# Patient Record
Sex: Female | Born: 1945
Health system: Southern US, Community
[De-identification: ages and names within clinical notes are randomized; demographics above are authoritative.]

## PROBLEM LIST (undated history)

## (undated) DIAGNOSIS — E119 Type 2 diabetes mellitus without complications: Secondary | ICD-10-CM

## (undated) DIAGNOSIS — M1990 Unspecified osteoarthritis, unspecified site: Secondary | ICD-10-CM

## (undated) DIAGNOSIS — K7581 Nonalcoholic steatohepatitis (NASH): Secondary | ICD-10-CM

## (undated) DIAGNOSIS — I10 Essential (primary) hypertension: Secondary | ICD-10-CM

## (undated) DIAGNOSIS — F32A Depression, unspecified: Secondary | ICD-10-CM

## (undated) DIAGNOSIS — I2510 Atherosclerotic heart disease of native coronary artery without angina pectoris: Secondary | ICD-10-CM

## (undated) DIAGNOSIS — G4730 Sleep apnea, unspecified: Secondary | ICD-10-CM

## (undated) DIAGNOSIS — N189 Chronic kidney disease, unspecified: Secondary | ICD-10-CM

## (undated) DIAGNOSIS — K219 Gastro-esophageal reflux disease without esophagitis: Secondary | ICD-10-CM

## (undated) DIAGNOSIS — I493 Ventricular premature depolarization: Secondary | ICD-10-CM

## (undated) DIAGNOSIS — M4800 Spinal stenosis, site unspecified: Secondary | ICD-10-CM

## (undated) DIAGNOSIS — F419 Anxiety disorder, unspecified: Secondary | ICD-10-CM

## (undated) DIAGNOSIS — E7800 Pure hypercholesterolemia, unspecified: Secondary | ICD-10-CM

## (undated) DIAGNOSIS — J189 Pneumonia, unspecified organism: Secondary | ICD-10-CM

## (undated) DIAGNOSIS — H409 Unspecified glaucoma: Secondary | ICD-10-CM

## (undated) DIAGNOSIS — J302 Other seasonal allergic rhinitis: Secondary | ICD-10-CM

## (undated) HISTORY — DX: Other seasonal allergic rhinitis: J30.2

## (undated) HISTORY — PX: SHOULDER ARTHROSCOPY: SHX128

## (undated) HISTORY — PX: KNEE ARTHROSCOPY: SHX127

## (undated) HISTORY — PX: BUNIONECTOMY: SHX129

## (undated) HISTORY — DX: Atherosclerotic heart disease of native coronary artery without angina pectoris: I25.10

## (undated) HISTORY — PX: ANTERIOR AND POSTERIOR VAGINAL REPAIR: SUR5

## (undated) HISTORY — DX: Unspecified osteoarthritis, unspecified site: M19.90

## (undated) HISTORY — PX: ROBOTIC ASSISTED LAPAROSCOPIC SACROCOLPOPEXY: SHX5388

## (undated) HISTORY — DX: Anxiety disorder, unspecified: F41.9

## (undated) HISTORY — DX: Gastro-esophageal reflux disease without esophagitis: K21.9

## (undated) HISTORY — PX: BLADDER SUSPENSION: SHX72

## (undated) HISTORY — DX: Type 2 diabetes mellitus without complications: E11.9

---
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
## 1974-02-04 HISTORY — PX: TOTAL ABDOMINAL HYSTERECTOMY: SHX209

## 1974-02-04 HISTORY — PX: NASAL RECONSTRUCTION: SHX2069

## 1997-07-13 ENCOUNTER — Ambulatory Visit (HOSPITAL_COMMUNITY): Admission: RE | Admit: 1997-07-13 | Discharge: 1997-07-13 | Payer: Self-pay | Admitting: Gastroenterology

## 1997-10-12 ENCOUNTER — Ambulatory Visit (HOSPITAL_COMMUNITY): Admission: RE | Admit: 1997-10-12 | Discharge: 1997-10-12 | Payer: Self-pay | Admitting: Gastroenterology

## 1999-06-12 ENCOUNTER — Other Ambulatory Visit: Admission: RE | Admit: 1999-06-12 | Discharge: 1999-06-12 | Payer: Self-pay | Admitting: Obstetrics and Gynecology

## 1999-06-27 ENCOUNTER — Encounter: Admission: RE | Admit: 1999-06-27 | Discharge: 1999-07-12 | Payer: Self-pay | Admitting: *Deleted

## 2000-02-15 ENCOUNTER — Encounter (INDEPENDENT_AMBULATORY_CARE_PROVIDER_SITE_OTHER): Payer: Self-pay | Admitting: Specialist

## 2000-02-15 ENCOUNTER — Ambulatory Visit (HOSPITAL_COMMUNITY): Admission: RE | Admit: 2000-02-15 | Discharge: 2000-02-15 | Payer: Self-pay | Admitting: Gastroenterology

## 2001-02-09 ENCOUNTER — Ambulatory Visit (HOSPITAL_COMMUNITY): Admission: RE | Admit: 2001-02-09 | Discharge: 2001-02-09 | Payer: Self-pay | Admitting: Orthopedic Surgery

## 2001-06-24 ENCOUNTER — Other Ambulatory Visit: Admission: RE | Admit: 2001-06-24 | Discharge: 2001-06-24 | Payer: Self-pay | Admitting: Obstetrics and Gynecology

## 2003-03-18 ENCOUNTER — Ambulatory Visit (HOSPITAL_COMMUNITY): Admission: RE | Admit: 2003-03-18 | Discharge: 2003-03-18 | Payer: Self-pay | Admitting: Gastroenterology

## 2003-03-18 ENCOUNTER — Encounter (INDEPENDENT_AMBULATORY_CARE_PROVIDER_SITE_OTHER): Payer: Self-pay | Admitting: Specialist

## 2003-10-31 ENCOUNTER — Encounter (INDEPENDENT_AMBULATORY_CARE_PROVIDER_SITE_OTHER): Payer: Self-pay | Admitting: *Deleted

## 2003-10-31 ENCOUNTER — Ambulatory Visit (HOSPITAL_COMMUNITY): Admission: RE | Admit: 2003-10-31 | Discharge: 2003-10-31 | Payer: Self-pay | Admitting: Gastroenterology

## 2004-08-31 ENCOUNTER — Encounter: Admission: RE | Admit: 2004-08-31 | Discharge: 2004-08-31 | Payer: Self-pay | Admitting: Orthopedic Surgery

## 2004-09-18 ENCOUNTER — Ambulatory Visit (HOSPITAL_BASED_OUTPATIENT_CLINIC_OR_DEPARTMENT_OTHER): Admission: RE | Admit: 2004-09-18 | Discharge: 2004-09-18 | Payer: Self-pay | Admitting: Plastic Surgery

## 2004-09-19 ENCOUNTER — Encounter (INDEPENDENT_AMBULATORY_CARE_PROVIDER_SITE_OTHER): Payer: Self-pay | Admitting: Specialist

## 2004-10-11 ENCOUNTER — Ambulatory Visit (HOSPITAL_BASED_OUTPATIENT_CLINIC_OR_DEPARTMENT_OTHER): Admission: RE | Admit: 2004-10-11 | Discharge: 2004-10-11 | Payer: Self-pay | Admitting: Orthopedic Surgery

## 2004-10-11 ENCOUNTER — Ambulatory Visit (HOSPITAL_COMMUNITY): Admission: RE | Admit: 2004-10-11 | Discharge: 2004-10-11 | Payer: Self-pay | Admitting: Orthopedic Surgery

## 2006-05-01 ENCOUNTER — Ambulatory Visit: Payer: Self-pay | Admitting: Cardiology

## 2006-05-09 ENCOUNTER — Ambulatory Visit: Payer: Self-pay

## 2006-05-09 LAB — CONVERTED CEMR LAB
AST: 27 units/L (ref 0–37)
Albumin: 4.1 g/dL (ref 3.5–5.2)
BUN: 17 mg/dL (ref 6–23)
Basophils Absolute: 0.1 10*3/uL (ref 0.0–0.1)
Basophils Relative: 0.8 % (ref 0.0–1.0)
Chloride: 107 meq/L (ref 96–112)
Cholesterol: 131 mg/dL (ref 0–200)
Eosinophils Absolute: 0.1 10*3/uL (ref 0.0–0.6)
GFR calc Af Amer: 109 mL/min
GFR calc non Af Amer: 90 mL/min
HDL: 46.6 mg/dL (ref >39.0)
LDL Cholesterol: 52 mg/dL (ref 0–99)
Lymphocytes Relative: 27.2 % (ref 12.0–46.0)
MCHC: 34.2 g/dL (ref 30.0–36.0)
Monocytes Absolute: 0.4 10*3/uL (ref 0.2–0.7)
Neutro Abs: 4.6 10*3/uL (ref 1.4–7.7)
Neutrophils Relative %: 64.9 % (ref 43.0–77.0)
Platelets: 265 10*3/uL (ref 150–400)
RDW: 12.2 % (ref 11.5–14.6)
TSH: 1.55 microintl units/mL (ref 0.35–5.50)
VLDL: 32 mg/dL (ref 0–40)

## 2006-06-03 ENCOUNTER — Ambulatory Visit: Payer: Self-pay | Admitting: Cardiology

## 2006-12-05 ENCOUNTER — Ambulatory Visit (HOSPITAL_COMMUNITY): Admission: RE | Admit: 2006-12-05 | Discharge: 2006-12-05 | Payer: Self-pay | Admitting: Gastroenterology

## 2008-02-05 HISTORY — PX: CATARACT EXTRACTION W/ INTRAOCULAR LENS IMPLANT: SHX1309

## 2008-03-14 ENCOUNTER — Encounter: Admission: RE | Admit: 2008-03-14 | Discharge: 2008-03-14 | Payer: Self-pay | Admitting: Family Medicine

## 2008-06-14 ENCOUNTER — Ambulatory Visit (HOSPITAL_COMMUNITY): Admission: RE | Admit: 2008-06-14 | Discharge: 2008-06-14 | Payer: Self-pay | Admitting: Ophthalmology

## 2008-06-22 ENCOUNTER — Encounter: Payer: Self-pay | Admitting: Pulmonary Disease

## 2008-09-26 ENCOUNTER — Encounter: Payer: Self-pay | Admitting: *Deleted

## 2008-09-26 ENCOUNTER — Ambulatory Visit: Payer: Self-pay | Admitting: Pulmonary Disease

## 2008-09-26 DIAGNOSIS — K219 Gastro-esophageal reflux disease without esophagitis: Secondary | ICD-10-CM | POA: Insufficient documentation

## 2008-09-26 DIAGNOSIS — E785 Hyperlipidemia, unspecified: Secondary | ICD-10-CM

## 2008-09-26 DIAGNOSIS — Z9889 Other specified postprocedural states: Secondary | ICD-10-CM

## 2008-09-26 DIAGNOSIS — I10 Essential (primary) hypertension: Secondary | ICD-10-CM | POA: Insufficient documentation

## 2008-09-26 DIAGNOSIS — J45909 Unspecified asthma, uncomplicated: Secondary | ICD-10-CM | POA: Insufficient documentation

## 2009-02-04 HISTORY — PX: CARDIAC CATHETERIZATION: SHX172

## 2009-08-17 ENCOUNTER — Telehealth (INDEPENDENT_AMBULATORY_CARE_PROVIDER_SITE_OTHER): Payer: Self-pay | Admitting: *Deleted

## 2009-09-15 ENCOUNTER — Telehealth (INDEPENDENT_AMBULATORY_CARE_PROVIDER_SITE_OTHER): Payer: Self-pay | Admitting: *Deleted

## 2009-09-25 ENCOUNTER — Encounter: Payer: Self-pay | Admitting: Cardiology

## 2009-10-04 ENCOUNTER — Telehealth: Payer: Self-pay | Admitting: Cardiology

## 2009-10-13 ENCOUNTER — Telehealth: Payer: Self-pay | Admitting: Cardiology

## 2009-10-19 ENCOUNTER — Ambulatory Visit: Payer: Self-pay | Admitting: Cardiology

## 2009-10-19 DIAGNOSIS — R002 Palpitations: Secondary | ICD-10-CM

## 2009-10-24 ENCOUNTER — Telehealth: Payer: Self-pay | Admitting: Cardiology

## 2009-11-27 ENCOUNTER — Telehealth (INDEPENDENT_AMBULATORY_CARE_PROVIDER_SITE_OTHER): Payer: Self-pay | Admitting: *Deleted

## 2009-11-28 ENCOUNTER — Ambulatory Visit: Payer: Self-pay

## 2009-11-28 ENCOUNTER — Encounter: Payer: Self-pay | Admitting: Cardiology

## 2009-11-28 ENCOUNTER — Ambulatory Visit: Payer: Self-pay | Admitting: Cardiovascular Disease

## 2009-11-28 ENCOUNTER — Ambulatory Visit (HOSPITAL_COMMUNITY): Admission: RE | Admit: 2009-11-28 | Discharge: 2009-11-28 | Payer: Self-pay | Admitting: Cardiology

## 2009-11-28 ENCOUNTER — Telehealth: Payer: Self-pay | Admitting: Cardiology

## 2009-11-29 ENCOUNTER — Encounter: Payer: Self-pay | Admitting: Cardiology

## 2009-11-30 ENCOUNTER — Ambulatory Visit: Payer: Self-pay

## 2009-11-30 LAB — CONVERTED CEMR LAB
Basophils Absolute: 0 10*3/uL (ref 0.0–0.1)
Basophils Relative: 0.4 % (ref 0.0–3.0)
CO2: 28 meq/L (ref 19–32)
Chloride: 98 meq/L (ref 96–112)
Eosinophils Absolute: 0.1 10*3/uL (ref 0.0–0.7)
Glucose, Bld: 154 mg/dL — ABNORMAL HIGH (ref 70–99)
Hemoglobin: 12.7 g/dL (ref 12.0–15.0)
INR: 1 (ref 0.8–1.0)
Neutro Abs: 5.5 10*3/uL (ref 1.4–7.7)
Neutrophils Relative %: 69.2 % (ref 43.0–77.0)
Platelets: 262 10*3/uL (ref 150.0–400.0)
Prothrombin Time: 10.7 s (ref 9.7–11.8)
Sodium: 133 meq/L — ABNORMAL LOW (ref 135–145)
WBC: 7.9 10*3/uL (ref 4.5–10.5)

## 2009-12-05 ENCOUNTER — Ambulatory Visit (HOSPITAL_COMMUNITY): Admission: RE | Admit: 2009-12-05 | Discharge: 2009-12-05 | Payer: Self-pay | Admitting: Cardiology

## 2009-12-05 ENCOUNTER — Ambulatory Visit: Payer: Self-pay | Admitting: Cardiology

## 2009-12-21 ENCOUNTER — Ambulatory Visit: Payer: Self-pay | Admitting: Cardiology

## 2009-12-21 DIAGNOSIS — I2510 Atherosclerotic heart disease of native coronary artery without angina pectoris: Secondary | ICD-10-CM | POA: Insufficient documentation

## 2009-12-22 ENCOUNTER — Ambulatory Visit: Payer: Self-pay | Admitting: Cardiology

## 2010-01-01 ENCOUNTER — Telehealth: Payer: Self-pay | Admitting: Cardiology

## 2010-01-01 LAB — CONVERTED CEMR LAB
ALT: 24 units/L (ref 0–35)
AST: 19 units/L (ref 0–37)
Albumin: 4.2 g/dL (ref 3.5–5.2)
Alkaline Phosphatase: 149 units/L — ABNORMAL HIGH (ref 39–117)
Bilirubin, Direct: 0.1 mg/dL (ref 0.0–0.3)
Cholesterol: 143 mg/dL (ref 0–200)
HDL: 39.8 mg/dL (ref >39.00)
Total Protein: 6.8 g/dL (ref 6.0–8.3)
Triglycerides: 165 mg/dL — ABNORMAL HIGH (ref 0.0–149.0)

## 2010-01-15 ENCOUNTER — Emergency Department (HOSPITAL_COMMUNITY)
Admission: EM | Admit: 2010-01-15 | Discharge: 2010-01-15 | Payer: Self-pay | Source: Home / Self Care | Admitting: Emergency Medicine

## 2010-03-04 LAB — CONVERTED CEMR LAB: TSH: 2.55 microintl units/mL (ref 0.35–5.50)

## 2010-03-06 NOTE — Progress Notes (Signed)
Summary: Stress Echo pre-procedure  Phone Note Outgoing Call Call back at [facility] Phone [phone]   Call placed by: Hubbard Robinson RN,  November 27, 2009 4:01 PM Call placed to: Patient Reason for Call: Confirm/change Appt Summary of Call: Left message on answering machine with Stress Echo instructions.

## 2010-03-06 NOTE — Progress Notes (Signed)
Summary: pt returned call fro results  Phone Note Call from Patient   Caller: Patient [phone] Reason for Call: Talk to Nurse, Lab or Test Results Summary of Call: re lab results Initial call taken by: Lorenda Hatchet,  January 01, 2010 4:48 PM  Follow-up for Phone Call        Pt. aware of lab results I will fax. results per her request. Follow-up by: Carollee Sires, RN, BSN,  January 01, 2010 5:06 PM

## 2010-03-06 NOTE — Progress Notes (Signed)
  Phone Note Other Incoming   Request: Send information Summary of Call: Request for records received from [facility]. Request forwarded to [facility].

## 2010-03-06 NOTE — Procedures (Signed)
Summary: Summary Report  Summary Report   Imported By: Gemma Payor 11/03/2009 13:10:32  _____________________________________________________________________  External Attachment:    Type:   Image     Comment:   External Document

## 2010-03-06 NOTE — Letter (Signed)
Summary: Cardiac Catheterization Instructions- Main Lab  [place] [place]  [address] [place]   [place], [place] [address]   Phone: [phone]  Fax: [phone]     11/29/2009 MRN: [id number]  [facility] [place], [place]  [address]  Dear Ms. Erskine Speed,   You are scheduled for Cardiac Catheterization on Tuesday Nov.1, 2011             with Dr.Amahri Dengel.  Please arrive at the [facility] at 5:30       a.m. on the day of your procedure.  1. DIET     __x__ Nothing to eat or drink after midnight except your medications with a sip of water.  2. Come to the [place] office on Oct.27th, 2011 for lab work.  The lab at [facility] is open from 8:30 a.m. to 1:30 p.m. and 2:30 p.m. to 5:00 p.m.  The lab at [place] is open from 7:30 a.m. to 5:30 p.m.  You do not have to be fasting.  3. MAKE SURE YOU TAKE YOUR ASPIRIN.       ___x_ YOU MAY TAKE ALL of your remaining medications with a small amount of water.  4. Plan for one night stay - bring personal belongings (i.e. toothpaste, toothbrush, etc.)  5. Bring a current list of your medications and current insurance cards.  6. Must have a responsible person to drive you home.   7. Someone must be with yu for the first 24 hours after you arrive home.  8. Please wear clothes that are easy to get on and off and wear slip-on shoes.  *Special note: Every effort is made to have your procedure done on time.  Occasionally there are emergencies that present themselves at the hospital that may cause delays.  Please be patient if a delay does occur.  If you have any questions after you get home, please call the office at the number listed above.  Whitney Jannett Celestine RN

## 2010-03-06 NOTE — Progress Notes (Signed)
Summary: Holter results   Phone Note Outgoing Call Call back at [facility] Phone [phone] Call back at [phone]   Call placed by: Alvis Lemmings, RN, BSN,  October 24, 2009 3:06 PM Call placed to: Patient Summary of Call: [facility] regarding holter results. Alvis Lemmings, RN, BSN  October 24, 2009 3:06 PM   Follow-up for Phone Call        pt rtn call the numbers listed are her contact numbers Shelda Pal  October 25, 2009 11:13 AM   The pt is aware of her holter results. She will keep her appt. for her echo and stress echo and we will make further decisions, per Dr. Olevia Perches, regarding her palpitations after he reviews the results. She is agreeable. She also wanted to know if he would agree to let her take Crestor 5 mg once daily instead of Crestor 50m once daily. I will review with Dr. BOlevia Perchesand call her back. Follow-up by: HAlvis Lemmings RN, BSN,  October 25, 2009 2:20 PM  Additional Follow-up for Phone Call Additional follow up Details #1::        I discussed the pt's Crestor with Dr. BOlevia Perches ok to take 586monce daily. The pt is aware. We will recheck a lipid/liver profile at her next office visit on 11/30/09. Additional Follow-up by: HeAlvis LemmingsRN, BSN,  October 25, 2009 6:35 PM

## 2010-03-06 NOTE — Assessment & Plan Note (Signed)
Summary: eph   Visit Type:  Follow-up Referring Kannen Moxey:  Dr. Hulan Fess Primary Nuchem Grattan:  Dr. Myriam Jacobson  CC:  Post-hospital.  History of Present Illness: The patient is 65 years old and returns for a followup visit after her recent catheterization.Marland Kitchen She works part-time in the has a Marine scientist at [employer]. Her husband is also a patient of mine. she was seen for evaluation of palpitations. She had a stress echo done which showed ST depression and a wall motion abnormality suggestive of ischemia. She underwent catheterization and she had only mild nonobstructive CAD with 30-40% narrowing in the LAD.  She had previously had an event monitor or Holter monitor which showed isolated PVCs and APCs with occasional pairs.  She does have a significant risk profile for vascular disease with both parents having bypass surgery and a brother having a cardiomyopathy which is probably ischemic. She also has hypertension and hyperlipidemia.  Current Medications (verified): 1)  Losartan Potassium-Hctz 50-12.5 Mg Tabs (Losartan Potassium-Hctz) .Marland Kitchen.. 1 Tab Qd 2)  Aspirin 81 Mg  Tabs (Aspirin) .... Take 1 Tablet By Mouth Once A Day 3)  Mobic 15 Mg Tabs (Meloxicam) .... Take 1 Tablet By Mouth Once A Day 4)  Prevacid 30 Mg Cpdr (Lansoprazole) .Marland Kitchen.. 1 Tab Qd 5)  Alphagan P 0.1 % Soln (Brimonidine Tartrate) .... Bid 6)  Crestor 5 Mg Tabs (Rosuvastatin Calcium) .... Take One Tablet By Mouth Daily.  Allergies: 1)  ! Septra  Past History:  Past Medical History: Reviewed history from 09/26/2008 and no changes required. HYPERLIPIDEMIA (ICD-272.4) HYPERTENSION (ICD-401.9) GERD (ICD-530.81)  Review of Systems       ROS is negative except as outlined in HPI.   Vital Signs:  Patient profile:   65 year old female Height:      66 inches Weight:      160.25 pounds BMI:     25.96 Pulse rate:   83 / minute Pulse rhythm:   regular Resp:     18 per minute BP sitting:   138 / 78  (left arm) Cuff  size:   large  Vitals Entered By: Sidney Ace (December 21, 2009 3:40 PM)  Physical Exam  Additional Exam:  Gen. Well-nourished, in no distress   Neck: No JVD, thyroid not enlarged, no carotid bruits Lungs: No tachypnea, clear without rales, rhonchi or wheezes Cardiovascular: Rhythm regular, PMI not displaced,  heart sounds  normal, no murmurs or gallops, no peripheral edema, pulses normal in all 4 extremities. Abdomen: BS normal, abdomen soft and non-tender without masses or organomegaly, no hepatosplenomegaly. MS: No deformities, no cyanosis or clubbing   Neuro:  No focal sns   Skin:  no lesions    Impression & Recommendations:  Problem # 1:  PALPITATIONS (ICD-785.1) She had documented PVCs and APCs on the monitor. We have about her with coronary angiography and she has minimal nonobstructive disease. Most of her palpitations are related to stress. We plan reassurance. Her updated medication list for this problem includes:    Aspirin 81 Mg Tabs (Aspirin) .Marland Kitchen... Take 1 tablet by mouth once a day  Orders: EKG w/ Interpretation (93000)  Her updated medication list for this problem includes:    Aspirin 81 Mg Tabs (Aspirin) .Marland Kitchen... Take 1 tablet by mouth once a day  Problem # 2:  CAD, NATIVE VESSEL (ICD-414.01) she has minimal nonobstructive CAD. She does have significant risk factors and she is on aspirin and statin which will now be considered secondary  prevention. Her updated medication list for this problem includes:    Aspirin 81 Mg Tabs (Aspirin) .Marland Kitchen... Take 1 tablet by mouth once a day  Problem # 3:  HYPERLIPIDEMIA (ICD-272.4) She is currently on Crestor 5 mg. We will get a fasting lipid profile.  She is somewhat sensitive about side effects from the Crestor and 10 she has minimal nonobstructive disease he'll probably target her LDL in the 100 and push her Crestor unless it is higher than that. Her updated medication list for this problem includes:    Crestor 5 Mg Tabs  (Rosuvastatin calcium) .Marland Kitchen... Take one tablet by mouth daily.  Patient Instructions: 1)  We will see you back on an as needed basis. 2)  Please return for FASTING labwork on a day that is good for you: lipid/liver (272.2). 3)  Your physician recommends that you continue on your current medications as directed. Please refer to the Current Medication list given to you today.

## 2010-03-06 NOTE — Progress Notes (Signed)
Summary: c/o pvc (talk w/ BB)  Phone Note Call from Patient Call back at [facility] Phone [phone] Call back at [phone] (cell)   Caller: Patient Reason for Call: Talk to Nurse Summary of Call: per pt calling c/o pvs most everyday.  Initial call taken by: Neil Crouch,  October 04, 2009 11:37 AM  Follow-up for Phone Call        I spoke with the pt. She has not been seen since 04/2006 with Dr. Olevia Perches. She called today stating that she has had palpitations, which she believes are PVC's. She feels dropped beats and then some nausea. She has had problems with the palpitations for about 6 months. She usually notices that she has these on days she works and it is after lunch. She works part time in the [place] at [employer]. The pt. states she explained this to her PCP, Dr. Harrington Challenger, and he did not seem concerned. She tried stopping caffeine and has noticed no change in her symptoms. She thinks her HR's usually run in the 80's to 100's. Her most recent bp was around 136/80. She has no documented strips of PVC's. I explained I would need to discuss this with Dr. Olevia Perches. We may need to have her come for an office visit first, or she may need to wear a holter on days she is working to see if we can capture what is going on. I also explained to the pt. that we do sometimes give as needed metoprolol tartrate for palpitations, but since we do not know exactally what she is doing, we may need to go a different route first. I will review with  Dr. Olevia Perches on call her back no later than friday. 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 She usually works on [place], so there may be some difficulty getting a monitor on her due to scheduling.   Follow-up by: Alvis Lemmings, RN, BSN,  October 04, 2009 12:20 PM

## 2010-03-06 NOTE — Progress Notes (Signed)
Summary: appt  ---- Converted from flag ---- ---- 10/04/2009 11:24 PM, Fatima Sanger, MD, [facility] wrote: Sounds like she needs to come in for a visit and will also need either an event monitor or Holter.  Schedule OV and we can discuss monitor on Fri. BB ------------------------------  Phone Note Outgoing Call   Call placed by: Alvis Lemmings, RN, BSN,  October 13, 2009 5:39 PM Call placed to: Patient Summary of Call: I spoke with the pt. She will come in on 9/15 to see Dr. Olevia Perches. Initial call taken by: Alvis Lemmings, RN, BSN,  October 13, 2009 5:39 PM

## 2010-03-06 NOTE — Progress Notes (Signed)
Summary: bloodwork  Phone Note Call from Patient   Caller: Patient [phone] ok to leave message Reason for Call: Talk to Nurse Summary of Call: pt calling to see if she needs bloodwork to see how crestor is working?  Initial call taken by: Lorenda Hatchet,  November 28, 2009 8:16 AM  Follow-up for Phone Call        Pt wondering if they should get blood work after Corporate investment banker. LVM stating that she should get her lipids/liver checked 12 weeks after starting Crestor and to call back & we could schedule this or she could schedule at her next appt with Marquette Piontek in a few weeks. Whitney Jannett Celestine RN  November 28, 2009 8:33 AM  Follow-up by: Whitney Jannett Celestine RN,  November 28, 2009 8:30 AM

## 2010-03-06 NOTE — Assessment & Plan Note (Signed)
Summary: np6   Visit Type:  Initial Consult Referring Provider:  Dr. Hulan Fess Primary Provider:  Dr. Myriam Jacobson  CC:  irregular heart beat.  History of Present Illness: The patient is 65 years old and is seen today for evaluation of palpitations. She works part-time in the has a Marine scientist at [employer]. Her husband is also a patient of mine. I had seen her in 2008 at which time she was having some shortness of breath and high blood pressure.  He evaluated her with a Myoview scan which was negative.  The past several months she has had increasing symptoms of irregular heartbeat. These appear messy related to stress. She gets them that were quite a bit. She feels them primarily in her throat. She has no associated chest pain short of breath or presyncope.  She does have a significant risk profile for vascular disease with both parents having bypass surgery and a brother having a cardiomyopathy which is probably ischemic. She also has hypertension and hyperlipidemia.  Current Medications (verified): 1)  Losartan Potassium-Hctz 50-12.5 Mg Tabs (Losartan Potassium-Hctz) .Marland Kitchen.. 1 Tab Qd 2)  Aspirin 81 Mg  Tabs (Aspirin) .... Take 1 Tablet By Mouth Once A Day 3)  Mobic 15 Mg Tabs (Meloxicam) .... Take 1 Tablet By Mouth Once A Day 4)  Proventil Hfa 108 (90 Base) Mcg/act Aers (Albuterol Sulfate) .... Rescue Inhaler As Needed 5)  Prevacid 30 Mg Cpdr (Lansoprazole) .Marland Kitchen.. 1 Tab Qd 6)  Alphagan P 0.1 % Soln (Brimonidine Tartrate) .... Bid  Allergies (verified): 1)  ! Septra  Past History:  Past Medical History: Reviewed history from 09/26/2008 and no changes required. HYPERLIPIDEMIA (ICD-272.4) HYPERTENSION (ICD-401.9) GERD (ICD-530.81)  Family History: Reviewed history from 09/26/2008 and no changes required. CABG-mother age 54 Father had CABG in his 16s Family History Coronary Heart Disease Family History MI/Heart Attack-Brother  Social History: Reviewed history from  09/26/2008 and no changes required. 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   Vital Signs:  Patient profile:   65 year old female Height:      66 inches Weight:      159 pounds BMI:     25.76 Pulse rate:   81 / minute BP sitting:   136 / 74  (left arm) Cuff size:   regular  Vitals Entered By: Lubertha Basque, CNA (October 19, 2009 10:41 AM)  Physical Exam  Additional Exam:  Gen. Well-nourished, in no distress   Neck: No JVD, thyroid not enlarged, no carotid bruits Lungs: No tachypnea, clear without rales, rhonchi or wheezes Cardiovascular: Rhythm regular, PMI not displaced,  heart sounds  normal, no murmurs or gallops, no peripheral edema, pulses normal in all 4 extremities. Abdomen: BS normal, abdomen soft and non-tender without masses or organomegaly, no hepatosplenomegaly. MS: No deformities, no cyanosis or clubbing   Neuro:  No focal sns   Skin:  no lesions    Impression & Recommendations:  Problem # 1:  PALPITATIONS (ICD-785.1)  The etiology of the palpitations is not clear. We will plan to evaluate her with a Holter monitor for 48 hours. She has a fairly high profile for vascular disease and we will evaluate her with a rest stress echo as well as an echo. We will also do TSH.  Her updated medication list for this problem includes:    Aspirin 81  Mg Tabs (Aspirin) .Marland Kitchen... Take 1 tablet by mouth once a day  Her updated medication list for this problem includes:    Aspirin 81 Mg Tabs (Aspirin) .Marland Kitchen... Take 1 tablet by mouth once a day  Orders: TLB-TSH (Thyroid Stimulating Hormone) (84443-TSH) Holter (Holter) Stress Echo (Stress Echo) Echocardiogram (Echo)  Problem # 2:  HYPERTENSION (ICD-401.9) This is controlled on current medications. Her updated medication list for this problem includes:    Losartan Potassium-hctz 50-12.5 Mg Tabs (Losartan  potassium-hctz) .Marland Kitchen... 1 tab qd    Aspirin 81 Mg Tabs (Aspirin) .Marland Kitchen... Take 1 tablet by mouth once a day  Problem # 3:  HYPERLIPIDEMIA (ICD-272.4) She brought her lipid profile today and her total cholesterol is 232, her triglycerides were 231, her LDL was 146, and her HDL was 32. Given these readings and her overall risk profile with hypertension and positive family history, I would recommend that she be treated with statins as primary prevention against vascular events. We will start Crestor 20 mg daily. Her updated medication list for this problem includes:    Crestor 20 Mg Tabs (Rosuvastatin calcium) .Marland Kitchen... Take one tablet by mouth daily.  Patient Instructions: 1)  Your physician recommends that you schedule a follow-up appointment in: 3 weeks. 2)  Your physician recommends that you have  lab work today: tsh (785.1) 3)  Your physician has requested that you have a stress echocardiogram. For further information please visit [email or web address].  Please follow instruction sheet as given. 4)  Your physician has requested that you have an echocardiogram.  Echocardiography is a painless test that uses sound waves to create images of your heart. It provides your doctor with information about the size and shape of your heart and how well your heart's chambers and valves are working.  This procedure takes approximately one hour. There are no restrictions for this procedure. 5)  Your physician has recommended that you wear a 48 hour holter monitor- you will have this placed today.  Holter monitors are medical devices that record the heart's electrical activity. Doctors most often use these monitors to diagnose arrhythmias. Arrhythmias are problems with the speed or rhythm of the heartbeat. The monitor is a small, portable device. You can wear one while you do your normal daily activities. This is usually used to diagnose what is causing palpitations/syncope (passing out). 6)  Start Crestor 46m once  daily. Prescriptions: CRESTOR 20 MG TABS (ROSUVASTATIN CALCIUM) Take one tablet by mouth daily.  #30 x 6   Entered by:   HAlvis Lemmings RN, BSN   Authorized by:   BFatima Sanger MD, [facility]  Signed by:   HAlvis Lemmings RN, BSN on 10/19/2009   Method used:   Electronically to        [facility] (retail)       [address]      [place]  [id number]      Ph: [phone]      Fax: [phone]  RxID:   [id number]

## 2010-03-06 NOTE — Progress Notes (Signed)
  Request Recieved from [facility] sent to [facility]  August 17, 2009 1:08 PM     Appended Document:  Recieved request from [facility] sent to [facility]

## 2010-05-15 LAB — BASIC METABOLIC PANEL
Calcium: 9.1 mg/dL (ref 8.4–10.5)
Chloride: 103 mEq/L (ref 96–112)
Creatinine, Ser: 0.75 mg/dL (ref 0.4–1.2)
GFR calc non Af Amer: >60 mL/min (ref >60)
Potassium: 3.9 mEq/L (ref 3.5–5.1)
Sodium: 138 mEq/L (ref 135–145)

## 2010-06-07 ENCOUNTER — Other Ambulatory Visit: Payer: Self-pay | Admitting: Dermatology

## 2010-06-19 NOTE — Op Note (Signed)
Tammy Pineda, Tammy Pineda               ACCOUNT NO.:  [id number]   MEDICAL RECORD NO.:  [id number]          PATIENT TYPE:  AMB   LOCATION:  ENDO                         FACILITY[facility]  [facility]   PHYSICIAN:  Jeryl Columbia, M.D.    DATE OF BIRTH:  1945/04/25   DATE OF PROCEDURE:  12/05/2006  DATE OF DISCHARGE:                               OPERATIVE REPORT   PROCEDURE:  Colonoscopy.   INDICATIONS FOR PROCEDURE:  Patient with a history of colon polyps due  for repeat screening.  Consent was signed after risks, benefits,  methods, and options were thoroughly discussed in the office multiple  times.   MEDICINES USED:  Fentanyl 125 mcg, Versed 12.5 mg.   DESCRIPTION OF PROCEDURE:  Rectal inspection was pertinent for small  external hemorrhoids.  Digital examination was negative.  The video  pediatric colonoscope was inserted and easily advanced around the colon  to the cecum.  This did require abdominal pressure, but no position  changes.  The cecum was identified by the appendiceal orifice and the  ileocecal valve.  In fact, the scope was inserted a short ways into the  terminal ileum which was normal.  Photo documentation was obtained.  The  scope was slowly withdrawn.  The prep was adequate.  There was some  liquid stool that required washing and suctioning.  Unfortunately fell  back to the splenic flexure and in order to get a great look at the  transverse and readvance around the hepatic flexure, we did have to roll  her on her back and then on her right side with some abdominal pressure.  We could readvance to the cecum that way and then we withdrew.  A good  look was had on slow withdrawal.  She had a rare right diverticuli, a  few left diverticuli, but no other abnormalities as we slowly withdrew  back to the rectum.  No polyps were seen.  Once back in the rectum,  anorectal pullthrough and retroflexion confirmed some small hemorrhoids.  The scope was straightened and readvanced a short  ways up the left side  of the colon, air was suctioned, and the scope removed.  The patient  tolerated the procedure well.  There was no obvious immediate  complications.   ENDOSCOPIC ASSESSMENT:  1. Internal and external small hemorrhoids.  2. Few left, rare right diverticuli.  3. Otherwise within normal limits to the terminal ileum.   PLAN:  Recheck colon screening in five years.  Per upper tract symptoms,  she will call me for a dilation p.r.n.  In the meantime, change her  Prilosec to Protonix and consider sublingual Levsin as an antispasmodic  or the new HyoMax and she will call me if she wants any of those and I  will see her back p.r.n.           ______________________________  Jeryl Columbia, M.D.    MEM/MEDQ  D:  12/05/2006  T:  12/05/2006  Job:  [id number]   cc:   C. Melinda Crutch, M.D.  Fax: [phone]

## 2010-06-22 NOTE — Op Note (Signed)
Tammy Pineda, Tammy Pineda               ACCOUNT NO.:  [id number]   MEDICAL RECORD NO.:  [id number]          PATIENT TYPE:  AMB   LOCATION:  ENDO                         FACILITY:  [facility]   PHYSICIAN:  Jeryl Columbia, M.D.    DATE OF BIRTH:  06-Jul-1945   DATE OF PROCEDURE:  10/31/2003  DATE OF DISCHARGE:                                 OPERATIVE REPORT   PROCEDURE:  Colonoscopy with polypectomy.   INDICATIONS:  Patient with recurrent right sided colon polyps, wanted to  repeat colonoscopy a little sooner just to make sure complete removal.  Consent was signed after risks, benefits, methods, options, thoroughly  discussed in the office on multiple occasions.   MEDICINES USED:  Demerol 80, Versed 8.   PROCEDURE NOTE:  Rectal inspection was pertinent for external hemorrhoids.  Digital exam was negative. Video pediatric adjustable colonoscope was  inserted and easily advanced to approximately the level of the splenic  flexure. At its junction, there was some looping. The patient was rolled on  her back and with abdominal pressure easily able to advance to the cecum.  Other than some left greater than rare right diverticula, no abnormalities  were seen. The cecum was identified by the appendiceal orifice and ileocecal  valve. Prep was adequate. There was some liquid stool that required washing  and suctioning, but on slow withdraw through the colon, the cecum appeared  normal. In the ascending, two questionable tiny polyps, one was hot  biopsied, questionable you could see the white hue around it from possible  previous polypectomy but unsure of the specific finding, but this area was  hot biopsied. Just above that in the ascending colon, a tiny possible polyp  was seen and was cold biopsied x1. We meant to hot biopsy it, but the entire  lesion in question was removed before we could apply the cautery, and we  elected to withdraw. On slow withdraw again, other than the left greater  than right  diverticula, no other abnormalities, polyps, etc., were seen as  we slowly withdrew back to the rectum. Anorectal pull through and  retroflexion confirmed some small hemorrhoids. Scope was straightened and  readvanced through the left side of the colon. Air was suctioned, and scope  removed. The patient tolerated the procedure well. There was no obvious  immediate complication.   ENDOSCOPIC DIAGNOSES:  1.  Internal and external hemorrhoids small hemorrhoids.  2.  Left greater than rare right diverticula.  3.  Two tiny questionable ascending polyps, one hot biopsied, one cold      biopsied.  4.  Otherwise within normal limits to the cecum.   PLAN:  Await pathology. Probable recheck colon screening in three to four  years _________ and happy to see back sooner p.r.n. Otherwise return to care  of Dr. Harrington Challenger for customary health care maintenance to include yearly rectals  and guaiacs.      MEM/MEDQ  D:  10/31/2003  T:  11/01/2003  Job:  [id number]   cc:   C. Melinda Crutch, M.D.  [address]  [place]  [place] [address]  Fax: [phone]

## 2010-06-22 NOTE — Op Note (Signed)
[facility]  Patient:    Tammy Pineda, Tammy Pineda Visit Number: [id number] MRN: [id number]          Service Type: DSU Location: DAY Attending Physician:  Tarri Glenn Page Dictated by:   Laurice Record. Aplington, M.D. Proc. Date: 02/09/01 Admit Date:  02/09/2001                             Operative Report  PREOPERATIVE DIAGNOSES: 1. Painful bunion. 2. Recurrent ingrown inner border great toenail. 3. Painful deformed second toenail, secondary to fungus left foot.  POSTOPERATIVE DIAGNOSES: 1. Painful bunion. 2. Recurrent ingrown inner border great toenail. 3. Painful deformed second toenail, secondary to fungus left foot.  OPERATION: 1. Simple bunionectomy, left foot. 2. Excision of inner border nail and matrix, great toe, left foot. 3. Excision of nail and matrix, second toe, left foot.  SURGEON:  Laurice Record. Aplington, M.D.  ASSISTANT:  Nurse.  ANESTHESIA:  Local block by anesthesiologist and MAC.  JUSTIFICATION FOR PROCEDURE:  She has an 11 degree first-second metatarsal angle on standing x-ray. She has some deformity of the inner border of the great toenail which is digging into her nailbed and has a badly deformed second toenail with fungus infection.  DESCRIPTION OF PROCEDURE:  After satisfactory condition foot block by anesthesiologist, the left foot and ankle were prepped with DuraPrep and draped as a sterile field. I made a dorsomedial incision over the bunion, extending proximal and over the proximal phalanx of the great toe. Small bleeders were coagulated. The capsule was identified and opened with a flap based distally. She had large chronic bunion which was excised mainly with osteotome but also smoothed up with rongeur until there was nice anatomic configuration. We then irrigated the wound well with sterile saline and the great toe in a neutral position correcting the valgus, I reattached the capsule with interrupted 0 Vicryl. The skin  and subcutaneous tissue were then closed with interrupted 4-0 nylon mattress sutures. Next, I made two parallel incisions along the mediolateral margins of the nail in her second toe and _______ the eponychium. The nail was then removed and the matrix with sharp dissection and curet was also removed. The flap was then replaced with interrupted 4-0 nylon. I then made a similar incision along the medial base of the great toe and elevated up the inner border with a small hemostat and excised a few millimeters of medial nail as well as excising matrix with knife and curet once again. The flap was then reapproximated with interrupted 4-0 nylon. Betadine Adaptic dry sterile dressing was applied. She tolerated the procedure well and was taken to the recovery room in satisfactory condition with no known complications. Dictated by:   Laurice Record. Aplington, M.D. Attending Physician:  Clare Gandy DD:  02/09/01 TD:  02/09/01 Job: [id number]

## 2010-06-22 NOTE — Procedures (Signed)
[facility]  Patient:    Tammy Pineda, Tammy Pineda                      MRN: [id number] Proc. Date: 02/15/00 Adm. Date:  [id number] Disc. Date: [id number] Attending:  Orvis Brill CC:         Ralene Bathe. Matthew Saras, M.D., [place].   Procedure Report  PROCEDURE:  Colonoscopy.  INDICATIONS FOR PROCEDURE:  Colonic screening in a patient with probable irritable bowel syndrome.  INDICATIONS:  Consent was signed after risks, benefits, methods, and options were thoroughly discussed in the office.  MEDICINES USED:  Demerol 70, Versed 7.  DESCRIPTION OF PROCEDURE:  Rectal inspection was pertinent for small external hemorrhoids. Digital exam was negative. Pediatric video colonoscope was inserted and despite some mild looping requiring abdominal pressure, we were able to advance around the colon to the cecum. The hepatic flexure was tortuous and we did have to roll her on her back at that junction. The cecum was identified by the appendiceal orifice and the ileocecal valve. No other abnormalities were seen on insertion. The scope was inserted a short ways into the terminal ileum which was normal. Photo documentation was obtained. The scope was slowly withdrawn. In the cecum opposite the ileocecal valve seemed to be an 8 mm sessile polyp which was carefully hot biopsied x 4 on a setting of 20:20. The scope was slowly withdrawn and along the fold opposite the ileocecal valve connecting the cecum and the ascending seemed to be edematous but not a polyp and two cold biopsies were obtained of that and put in separate containers. The scope was slowly withdrawn. No other abnormalities were seen other than some left sided diverticular and wall edema as we slowly withdrew back to the rectum. The prep was adequate. There was some liquid stool that required washing and suctioning. Once back in the rectum, the scope was retroflexed pertinent for some small internal  hemorrhoids. The scope was straightened and readvanced a short ways up the sigmoid, air was suctioned, the scope removed. The patient tolerated the procedure well, there was no obvious or immediate complications.  ENDOSCOPIC DIAGNOSES: 1. Internal/external hemorrhoids. 2. Rare left sided diverticula. 3. Cecal sessile 8 mm polyp carefully hot biopsied on a setting of 20:20. 4. Abnormal fold at the cecal ascending junction probably just edema status    post cold biopsy. 5. Otherwise within normal limits to the terminal ileum.  PLAN:  Await pathology to determine future colonic screening. GI follow-up p.r.n. Otherwise return care to Dr. Matthew Saras at [facility] for the customary health care maintenance to include yearly rectals and guaiacs and in the meantime continue her Prilosec since that works wonderfully on her upper tract symptoms. DD:  02/15/00 TD:  02/16/00 Job: [id number]

## 2010-06-22 NOTE — Op Note (Signed)
Tammy Pineda, BRASHEAR               ACCOUNT NO.:  [id number]   MEDICAL RECORD NO.:  [id number]          PATIENT TYPE:  AMB   LOCATION:  [place]                          FACILITY:  [facility]   PHYSICIAN:  Crissie Reese, M.D.     DATE OF BIRTH:  14-Jul-1945   DATE OF PROCEDURE:  09/18/2004  DATE OF DISCHARGE:                                 OPERATIVE REPORT   PREOPERATIVE DIAGNOSIS:  Lesions of undetermined behavior of forehead, left  cheek and back.   POSTOPERATIVE DIAGNOSIS:  Lesions of undetermined behavior of forehead, left  cheek and back.   OPERATION PERFORMED:  1.  Excision lesion of forehead, greater than 0.5 cm undetermined behavior.  2.  Excision lesion of cheek, less than 0.5 cm.  3.  Destruction of lesion, right shoulder.  4.  Complex wound closures, face, total length 2.0 cm.   SURGEON:  Crissie Reese, M.D.   ANESTHESIA:  1% Xylocaine with epinephrine plus bicarb.   INDICATIONS FOR PROCEDURE:  This is a 65 year old woman who has lesions she  would like to have removed.  These have enlarged and changed and have become  pigmented.  Petra Kuba of the procedure and the risks were discussed with her  and she wished to proceed.   DESCRIPTION OF PROCEDURE:  The patient was taken to the operating room and  placed supine.  The lesions were marked and she agreed with the markings.  She was prepped with Betadine and draped with sterile drapes.  Elliptical  incisions were designed with the skin line and the local anesthetic was  infiltrated satisfactorily.  The elliptical excisions were performed as such  and removed.  Wounds irrigated thoroughly and layered closures with 5-0  Monocryl interrupted inverted deep sutures and 5-0 Monocryl interrupted  inverted deep dermal sutures and 6-0 Prolene simple running suture.  Antiobiotic ointment was applied as well as dry sterile dressings.  She was  then placed in left lateral decubitus position and the lesion on the right  shoulder was addressed.   Satisfactory local anesthesia was achieved.  The  lesion was removed with curette and the base was cauterized with a needle  tip cautery on a very low setting.  Antibiotic ointment and Xeroform gauze  applied.  This is just a very partial thickness curettage of this lesion.  The patient tolerated the procedure well.   DISPOSITION:  Recheck in the office next week.      Crissie Reese, M.D.  Electronically Signed     DB/MEDQ  D:  09/18/2004  T:  09/18/2004  Job:  [id number]

## 2010-06-22 NOTE — Op Note (Signed)
NAME:  ADRIE, PICKING                         ACCOUNT NO.:  [id number]   MEDICAL RECORD NO.:  [id number]                   PATIENT TYPE:  AMB   LOCATION:  ENDO                                 FACILITY:  [facility]   PHYSICIAN:  Jeryl Columbia, M.D.                 DATE OF BIRTH:  September 25, 1945   DATE OF PROCEDURE:  03/18/2003  DATE OF DISCHARGE:                                 OPERATIVE REPORT   PROCEDURE:  Colonoscopy with polypectomy.   INDICATION:  Patient with history of colon polyps, due for repeat screening.  Consent was signed after risks, benefits, methods, and options thoroughly  discussed in the office in the past.   MEDICINES USED:  1. Fentanyl 80 mcg.  2. Versed 8 mg.   DESCRIPTION OF PROCEDURE:  Rectal inspection was pertinent for external  hemorrhoids.  Digital exam was negative.  The tone seemed adequate.  Video  pediatric adjustable colonoscope was inserted and easily to the mid  transverse.  At that point, there was looping, rolled her on her back and  with abdominal pressure, were able to be advanced to the cecum.  In the mid  ascending, a sessile polyp just under a fold was seen.  Photodocumentation  was obtained.  No other abnormalities were seen on insertion.  The cecum was  identified by the appendiceal orifice and the ileocecal valve.  Just  opposite the ileocecal valve, the residual of the polyp previously seen was  seen and with a setting of 15/150, we carefully snared the polyp making sure  not too much mucosa was in the snare.  Electrocautery was applied.  The  polyp was removed and suctioned through the scope.  We did take a few hot  biopsies of the edge of the polyp on the same setting.  We put those in a  different container.  There were no signs of bleeding or obvious residual  polyp at this junction.  We went ahead and withdrew back to the ascending  sessile polyp and on the same settings, proceeded with a snare in the same  fashion.  Part of the polyp was  removed, suctioned through the scope, and  collected in the trap and put in the third container.  A few hot biopsies  again of this residual parts of the polyp were obtained and put in the third  container as well.  There were no signs of bleeding or obvious complication.  The scope was then slowly withdrawn.  We did roll her on her left side to  see if there was any obvious residual polyps on a different angle, and none  were seen.  We went ahead and further withdrew.  There was some early left-  sided diverticula and two sigmoid tiny to small polyps which we hot biopsied  and put in the fourth container.  One was in the proximal sigmoid, and the  other was  in the more distal sigmoid.  No other abnormalities were seen as  we slowly withdrew back to the rectum.  Anorectal pull-through and  retroflexion confirmed some small hemorrhoids.  The scope was reinserted a  short ways up the left side of the colon; air was suctioned and scope  removed.  The patient tolerated the procedure well.  There was no obvious  immediate complication.   ENDOSCOPIC DIAGNOSES:  1. Internal-external hemorrhoids.  2. Left-sided small diverticula.  3. Two questionable sigmoid polyps, both hot biopsied and put in container     #4.  4. Ascending sessile polyp, snared and hot biopsied, put in #3.  5. Residual cecal polyp, status post snare, put in #1 and hot biopsy of the     _________ #2.  6. Otherwise, within normal limits to the cecum.   PLAN:  1. Await pathology but based on the sessile nature and difficult to remove     positions, would repeat sooner.  2. Happy to see back p.r.n.  3. Customary postpolypectomy instructions.  4. Yearly rectals and guaiacs per Dr. Harrington Challenger.                                               Jeryl Columbia, M.D.    MEM/MEDQ  D:  03/18/2003  T:  03/18/2003  Job:  [id number]   cc:   C. Melinda Crutch, M.D.  [address]  [place]  [place] [address]  Fax: [phone]

## 2010-06-22 NOTE — Op Note (Signed)
[name], [name]               ACCOUNT NO.:  [id number]   MEDICAL RECORD NO.:  [id number]          PATIENT TYPE:  AMB   LOCATION:  [facility]                         [facility]ACILITY:  [facility]   PHYSICIAN:  Metta Clines. Supple, M.D.  DATE OF BIRTH:  [birth year]   DATE OF PROCEDURE:  10/11/2004  DATE OF DISCHARGE:                                 OPERATIVE REPORT   PREOPERATIVE DIAGNOSES:  1.  Left shoulder impingement syndrome.  2.  Left shoulder symptomatic acromioclavicular joint arthrosis.   POSTOPERATIVE DIAGNOSES:  1.  Left shoulder impingement syndrome.  2.  Left shoulder symptomatic acromioclavicular joint arthrosis.  3.  Left shoulder adhesive capsulitis.   PROCEDURE:  1.  Left shoulder examination under anesthesia.  2.  Left shoulder manipulation under anesthesia.  3.  Left shoulder glenohumeral joint diagnostic arthroscopy.  4.  Arthroscopic subacromial decompression and bursectomy.  5.  Arthroscopic distal clavicle resection.   SURGEON:  Metta Clines. Supple, M.D.   Terrence DupontOlivia Mackie A. Shuford, P.A.-C.   ANESTHESIA:  General endotracheal as well as an interscalene block.   ESTIMATED BLOOD LOSS:  Minimal.   DRAINS:  None.   HISTORY:  Tammy Pineda is a 65 year old female whose had chronic left  shoulder pain, weakness and limitations in motion with examination showing a  positive impingement sign and exquisite tenderness over the [facility] joint.  Preoperative x-rays and MRI scan show evidence for advanced AC joint  arthropathy with a prominent inferiorly projecting spur impinging upon the  rotator cuff. There is no obvious full-thickness rotator cuff tears. Due to  Tammy Pineda's ongoing pain and functional limitation, she is brought to the  operating room at this time for planned left shoulder arthroscopy as  described below.   Preoperatively I had counseled Tammy Pineda on treatment options as well as  the risks versus benefits thereof. Possible surgical complications of  bleeding,  infection, neurovascular injury, persistent pain, loss of motion,  anesthetic complications and possible need for additional surgery are  reviewed. She understands and accepts and agrees with our planned procedure.   DESCRIPTION OF PROCEDURE:  After undergoing routine preop evaluation, the  patient received prophylactic antibiotics. An interscalene block was  established in the holding area with the anesthesia department. Placed  supine on the operating table and underwent smooth induction of general  endotracheal anesthesia. Turned to the right lateral decubitus position on  the bean bag and appropriately padded and protected. Left shoulder  examination under anesthesia showed significant restrictions in passive  shoulder motion. Approximately 120 degrees of forward elevation. I performed  a manipulation with audible and palpable release of adhesions and ultimately  170 degrees of abduction and forward elevation was achieved. The left arm  was then suspended in the 70/30 position with 10 pounds of traction. The  left shoulder girdle region was sterilely prepped and draped in standard  fashion. A posterior portal was established into the glenohumeral joint and  diagnostic arthroscopy was performed. The glenohumeral articular surfaces  were in excellent condition. There was no obvious biceps pathology. The  labrum was intact although we did see evidence  where there had been some  tearing of the capsular tissues anteriorly and posteriorly secondary to the  manipulation. Hemarthrosis was evacuated. The rotator cuff was carefully  inspected and found to be intact. The remaining inspection of the  glenohumeral joint showed no obvious additional pathology. Fluid and  instruments were removed from the glenohumeral joint with the arm dropped  down to 30 degrees of abduction and the arthroscope was introduced into the  subacromial space with the posterior portal and a direct lateral portal   established in the subacromial space. Abundant proliferative bursal tissue  was encountered and this was removed with a combination of the shaver and  the Arthrex wand. The wand was then used to remove the periosteum from the  undersurface of the anterior half of the acromion and a subacromial  decompression was performed with a bur creating type 1 morphology. A portal  was then established directly anterior to the distal clavicle and a distal  clavicle resection was performed with a bur. Care was taken to be sure that  the entire circumference of the distal clavicle could be visualized to  ensure adequate removal of bone. We then completed the subacromial  bursectomy and obtained hemostasis. The bursal surface of the rotator cuff  was inspected and probed and found to be intact. Final inspection and  irrigation was then completed. Fluid and instruments were removed. The  portals were closed Monocryl and Steri-Strips. A bulky dry dressing  __________ left shoulder, left arm was placed in a sling. The patient was  __________ extubated and taken to the recovery room in stable condition.      Metta Clines. Supple, M.D.  Electronically Signed     KMS/MEDQ  D:  10/11/2004  T:  10/11/2004  Job:  [id number]

## 2010-06-22 NOTE — Assessment & Plan Note (Signed)
[facility]                            CARDIOLOGY OFFICE NOTE   QUATISHA, ZYLKA                      MRN:          [id number]  DATE:05/01/2006                            DOB:          1945/06/13    REFERRING PHYSICIAN:  C. Melinda Crutch, M.D.   PRIMARY CARE PHYSICIAN:  C. Melinda Crutch, M.D., [place],  [facility].   HISTORY OF PRESENT ILLNESS:  Mrs. Marrin is a 65 year old who is known  to me through her husband, who is a patient of mine. She is also an OR  Nurse at [employer]. She has recently been having  symptoms of exertional fatigue and elevated blood pressure. She takes  her blood pressure at home and she notices that when she exerts herself,  she will get real full in her head and she will take her blood pressure  and it can be as high at 170 or 180. She also notices that when she  exerts herself and goes up hills, she will get short of breath and give  out very easily. She has had no chest pain. Does have occasional  palpitations.   PAST FAMILY PSYCHIATRIC HISTORY:  Significant for the above mentioned  hypertension, although she has never been on medication. She also has an  elevated cholesterol, that has been treated with Crestor. She said her  cholesterol went from 270 down to 170 and I have a reading from Dr. Harrington Challenger  of 140 now. She also has a history of GERD.   PAST SURGICAL HISTORY:  Significant for previous shoulder surgery,  hysterectomy, left knee arthroscopy.   CURRENT MEDICATIONS:  Include Crestor, Mobic, and Prilosec.   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 She does not smoke. She is married and  has 1 child.   FAMILY HISTORY:  Positive in that she has a mother who had CABG at age  72, although she probably had myocardial infarctions before that. She is  alive at 61. Father has no heart disease. She has a brother who at  72,  has an ejection fraction of 25% after previous myocardial infarctions.   REVIEW OF SYSTEMS:  Positive for symptoms related to arthritis in her  knees and reflux symptoms.   PHYSICAL EXAMINATION:  VITAL SIGNS:  Blood pressure is 155/78, pulse 79  and regular.  NECK:  There was no venous distention. Carotid pulses were full without  bruits.  CHEST:  Clear.  CARDIAC:  Rhythm was regular. I could hear no murmurs or gallops.  ABDOMEN:  Soft with normal bowel sounds. There is no hepatosplenomegaly.  EXTREMITIES:  Peripheral pulses were full. There is no peripheral edema.  MUSCULOSKELETAL:  No deformities.  SKIN:  Warm and dry.  NEUROLOGIC:  Examination showed no focal neurological signs.   DIAGNOSTIC STUDIES:  An electrocardiogram was normal.   IMPRESSION:  1. Exertional fatigue and shortness of breath, rule out ischemia.  2. Hypertension, recent onset.  3. Hyperlipidemia, treated.  4. Positive family history for coronary heart disease.  RECOMMENDATIONS:  Mrs. Mcmurry has a very high risk profile for vascular  disease and has symptoms of exertional shortness of breath and fatigue.  I think she should be evaluated further and we have arranged for her to  have a rest/stress exercise Myoview scan. Her blood pressure is also up,  both by her readings today and at home and I think that she  should__________. Will start her on Norvasc 5 mg daily for her blood  pressure. Will also get laboratory work including a BMP, CBC, and TSH. I  will plan to see her back in 3 weeks, to judge her response to treatment  and discuss the results of her Myoview scan.     Bruce Alfonso Patten Olevia Perches, MD, [facility]  Electronically Signed    BRB/MedQ  DD: 05/01/2006  DT: 05/01/2006  Job #: [id number]

## 2011-02-26 DIAGNOSIS — E7800 Pure hypercholesterolemia, unspecified: Secondary | ICD-10-CM | POA: Diagnosis not present

## 2011-02-26 DIAGNOSIS — F430 Acute stress reaction: Secondary | ICD-10-CM | POA: Diagnosis not present

## 2011-02-26 DIAGNOSIS — Z23 Encounter for immunization: Secondary | ICD-10-CM | POA: Diagnosis not present

## 2011-02-26 DIAGNOSIS — I10 Essential (primary) hypertension: Secondary | ICD-10-CM | POA: Diagnosis not present

## 2011-02-26 DIAGNOSIS — K219 Gastro-esophageal reflux disease without esophagitis: Secondary | ICD-10-CM | POA: Diagnosis not present

## 2011-02-26 DIAGNOSIS — M19049 Primary osteoarthritis, unspecified hand: Secondary | ICD-10-CM | POA: Diagnosis not present

## 2011-03-26 DIAGNOSIS — N9089 Other specified noninflammatory disorders of vulva and perineum: Secondary | ICD-10-CM | POA: Diagnosis not present

## 2011-05-07 DIAGNOSIS — H401290 Low-tension glaucoma, unspecified eye, stage unspecified: Secondary | ICD-10-CM | POA: Diagnosis not present

## 2011-05-07 DIAGNOSIS — H04129 Dry eye syndrome of unspecified lacrimal gland: Secondary | ICD-10-CM | POA: Diagnosis not present

## 2011-05-07 DIAGNOSIS — H2510 Age-related nuclear cataract, unspecified eye: Secondary | ICD-10-CM | POA: Diagnosis not present

## 2011-09-13 DIAGNOSIS — R079 Chest pain, unspecified: Secondary | ICD-10-CM | POA: Diagnosis not present

## 2011-09-13 DIAGNOSIS — R42 Dizziness and giddiness: Secondary | ICD-10-CM | POA: Diagnosis not present

## 2011-10-02 DIAGNOSIS — Z1231 Encounter for screening mammogram for malignant neoplasm of breast: Secondary | ICD-10-CM | POA: Diagnosis not present

## 2011-11-06 DIAGNOSIS — Z961 Presence of intraocular lens: Secondary | ICD-10-CM | POA: Diagnosis not present

## 2011-11-06 DIAGNOSIS — H2510 Age-related nuclear cataract, unspecified eye: Secondary | ICD-10-CM | POA: Diagnosis not present

## 2011-11-06 DIAGNOSIS — H401290 Low-tension glaucoma, unspecified eye, stage unspecified: Secondary | ICD-10-CM | POA: Diagnosis not present

## 2011-11-11 DIAGNOSIS — Z01419 Encounter for gynecological examination (general) (routine) without abnormal findings: Secondary | ICD-10-CM | POA: Diagnosis not present

## 2011-11-11 DIAGNOSIS — Z0000 Encounter for general adult medical examination without abnormal findings: Secondary | ICD-10-CM | POA: Diagnosis not present

## 2011-12-16 DIAGNOSIS — M2550 Pain in unspecified joint: Secondary | ICD-10-CM | POA: Diagnosis not present

## 2012-02-06 DIAGNOSIS — M064 Inflammatory polyarthropathy: Secondary | ICD-10-CM | POA: Diagnosis not present

## 2012-02-06 DIAGNOSIS — R5383 Other fatigue: Secondary | ICD-10-CM | POA: Diagnosis not present

## 2012-02-06 DIAGNOSIS — M2550 Pain in unspecified joint: Secondary | ICD-10-CM | POA: Diagnosis not present

## 2012-02-06 DIAGNOSIS — M1990 Unspecified osteoarthritis, unspecified site: Secondary | ICD-10-CM | POA: Diagnosis not present

## 2012-02-06 DIAGNOSIS — R5381 Other malaise: Secondary | ICD-10-CM | POA: Diagnosis not present

## 2012-02-09 ENCOUNTER — Emergency Department (HOSPITAL_COMMUNITY)
Admission: EM | Admit: 2012-02-09 | Discharge: 2012-02-09 | Disposition: A | Discharge status: Home / Self Care | Payer: Medicare Other | Attending: Emergency Medicine | Admitting: Emergency Medicine

## 2012-02-09 ENCOUNTER — Encounter (HOSPITAL_COMMUNITY): Payer: Self-pay | Admitting: Adult Health

## 2012-02-09 ENCOUNTER — Emergency Department (HOSPITAL_COMMUNITY): Payer: Medicare Other

## 2012-02-09 DIAGNOSIS — Z7982 Long term (current) use of aspirin: Secondary | ICD-10-CM | POA: Insufficient documentation

## 2012-02-09 DIAGNOSIS — F419 Anxiety disorder, unspecified: Secondary | ICD-10-CM

## 2012-02-09 DIAGNOSIS — Z8679 Personal history of other diseases of the circulatory system: Secondary | ICD-10-CM | POA: Diagnosis not present

## 2012-02-09 DIAGNOSIS — E7800 Pure hypercholesterolemia, unspecified: Secondary | ICD-10-CM | POA: Diagnosis not present

## 2012-02-09 DIAGNOSIS — I10 Essential (primary) hypertension: Secondary | ICD-10-CM | POA: Insufficient documentation

## 2012-02-09 DIAGNOSIS — Z79899 Other long term (current) drug therapy: Secondary | ICD-10-CM | POA: Diagnosis not present

## 2012-02-09 DIAGNOSIS — F411 Generalized anxiety disorder: Secondary | ICD-10-CM | POA: Insufficient documentation

## 2012-02-09 DIAGNOSIS — R0789 Other chest pain: Secondary | ICD-10-CM | POA: Diagnosis not present

## 2012-02-09 DIAGNOSIS — R0609 Other forms of dyspnea: Secondary | ICD-10-CM | POA: Insufficient documentation

## 2012-02-09 DIAGNOSIS — R0989 Other specified symptoms and signs involving the circulatory and respiratory systems: Secondary | ICD-10-CM | POA: Insufficient documentation

## 2012-02-09 DIAGNOSIS — R110 Nausea: Secondary | ICD-10-CM | POA: Insufficient documentation

## 2012-02-09 DIAGNOSIS — H409 Unspecified glaucoma: Secondary | ICD-10-CM | POA: Diagnosis not present

## 2012-02-09 DIAGNOSIS — R079 Chest pain, unspecified: Secondary | ICD-10-CM | POA: Diagnosis not present

## 2012-02-09 HISTORY — DX: Essential (primary) hypertension: I10

## 2012-02-09 HISTORY — DX: Unspecified glaucoma: H40.9

## 2012-02-09 HISTORY — DX: Pure hypercholesterolemia, unspecified: E78.00

## 2012-02-09 HISTORY — DX: Ventricular premature depolarization: I49.3

## 2012-02-09 LAB — BASIC METABOLIC PANEL
CO2: 28 mEq/L (ref 19–32)
Chloride: 105 mEq/L (ref 96–112)
GFR calc Af Amer: 75 mL/min — ABNORMAL LOW (ref >90)
Glucose, Bld: 141 mg/dL — ABNORMAL HIGH (ref 70–99)
Sodium: 142 mEq/L (ref 135–145)

## 2012-02-09 LAB — POCT I-STAT TROPONIN I: Troponin i, poc: 0 ng/mL (ref 0.00–0.08)

## 2012-02-09 LAB — CBC
Hemoglobin: 13 g/dL (ref 12.0–15.0)
MCH: 29.5 pg (ref 26.0–34.0)
Platelets: 279 10*3/uL (ref 150–400)
RBC: 4.4 MIL/uL (ref 3.87–5.11)

## 2012-02-09 LAB — D-DIMER, QUANTITATIVE: D-Dimer, Quant: 0.31 ug/mL-FEU (ref 0.00–0.48)

## 2012-02-09 MED ORDER — MORPHINE SULFATE 4 MG/ML IJ SOLN: 4.0000 mg | Freq: Once | INTRAMUSCULAR | Status: DC

## 2012-02-09 MED ORDER — LORAZEPAM 2 MG/ML IJ SOLN
0.5000 mg | Freq: Once | INTRAMUSCULAR | Status: AC
  Administered 2012-02-09: 0.5 mg via INTRAVENOUS
  Filled 2012-02-09: qty 1

## 2012-02-09 MED ORDER — MORPHINE SULFATE 2 MG/ML IJ SOLN
2.0000 mg | Freq: Once | INTRAMUSCULAR | Status: AC
  Administered 2012-02-09: 2 mg via INTRAVENOUS
  Filled 2012-02-09: qty 1

## 2012-02-09 MED ORDER — ASPIRIN 325 MG PO TABS
325.0000 mg | ORAL_TABLET | ORAL | Status: AC
  Administered 2012-02-09: 325 mg via ORAL
  Filled 2012-02-09: qty 1

## 2012-02-09 MED ORDER — LORAZEPAM 1 MG PO TABS: 1.0000 mg | ORAL_TABLET | Freq: Three times a day (TID) | ORAL | Status: DC | PRN

## 2012-02-09 NOTE — ED Notes (Signed)
Patient transported to X-ray 

## 2012-02-09 NOTE — ED Provider Notes (Signed)
History     CSN: [id number]  Arrival date & time 02/09/12  1627   First MD Initiated Contact with Patient 02/09/12 1643      Chief Complaint  Patient presents with  . Chest Pain    (Consider location/radiation/quality/duration/timing/severity/associated sxs/prior treatment) Patient is a 67 y.o. female presenting with chest pain. The history is provided by the patient.  Chest Pain   She noticed onset last night of pain as severe pressure feeling in her chest. This started while she was dancing rather vigorously. There is associated dyspnea nausea but no vomiting. There was no diaphoresis. Heaviness is rated at 8/10, but it subsided and is now 4/10. It is never gone away. It is not worse with a deep breathing it is not affected by body position. It is intermittently worse with walking. She's never had symptoms like this before. The discomfort did radiate up to her neck, but not to her jaw, shoulder, back, or arm. She took a one of her husband's nitroglycerin tablets which did tingle under her tongue and did give her a headache but did not affect her discomfort.  Past Medical History  Diagnosis Date  . Hypertension   . Glaucoma   . Hypercholesteremia   . PVC (premature ventricular contraction)     Past Surgical History  Procedure Date  . Cardiac catheterization 2011    clean cath  . Cardiovascular stress test     History reviewed. No pertinent family history.  History  Substance Use Topics  . Smoking status: Never Smoker   . Smokeless tobacco: Not on file  . Alcohol Use: No    OB History    Grav Para Term Preterm Abortions TAB SAB Ect Mult Living                  Review of Systems  Cardiovascular: Positive for chest pain.  All other systems reviewed and are negative.    Allergies  Sulfamethoxazole w-trimethoprim  Home Medications  No current outpatient prescriptions on file.  BP 147/61  Pulse 78  Temp 98 F (36.7 C) (Oral)  Resp 20  SpO2 97%  Physical  Exam  Nursing note and vitals reviewed. 67 year old female, resting comfortably and in no acute distress. Vital signs are significant for mild hypertension with blood pressure 147/61. Oxygen saturation is 97%, which is normal. Head is normocephalic and atraumatic. PERRLA, EOMI. Oropharynx is clear. Neck is nontender and supple without adenopathy or JVD. Back is nontender and there is no CVA tenderness. Lungs are clear without rales, wheezes, or rhonchi. Chest is nontender. Heart has regular rate and rhythm without murmur. Abdomen is soft, flat, nontender without masses or hepatosplenomegaly and peristalsis is normoactive. Extremities have no cyanosis or edema, full range of motion is present. Skin is warm and dry without rash. Neurologic: Mental status is normal, cranial nerves are intact, there are no motor or sensory deficits.   ED Course  Procedures (including critical care time)   Labs Reviewed  CBC  BASIC METABOLIC PANEL   No results found.   Date: 02/09/2012  Rate: 84  Rhythm: normal sinus rhythm  QRS Axis: normal  Intervals: normal  ST/T Wave abnormalities: nonspecific ST/T changes  Conduction Disutrbances:none  Narrative Interpretation: Nonspecific ST and T changes. When compared with ECG of 12/21/2009, no significant changes are seen.  Old EKG Reviewed: unchanged    1. Chest discomfort   2. Anxiety       MDM  Chest discomfort of uncertain cause.  I reviewed her prior records and she had a cardiac catheterization on 12/05/2009 which showed mild to moderate lesions in the LAD with the worst lesion at 30-40% and no lesions in the circumflex or right coronary arteries. Given her rather recent stress test without significant obstruction, it is unlikely that this is a cardiac in nature. Also, troponin is normal and ECG is unchanged. She will get screen for pulmonary embolism with a d-dimer. Aspirin was ordered at triage, and she is given a dose of morphine.  She got  little relief from morphine. Her sensation is concerned that she may have significant depression. She's given a dose of lorazepam with excellent relief of symptoms. She is encouraged to follow through with counseling to getting any issues that may be causing anxiety or stress and she's given a prescription for lorazepam and is to followup with her PCP in the next several days.        Delora Fuel, MD [id number]

## 2012-02-09 NOTE — ED Notes (Addendum)
Presents with " Chest pressure that began last night while dancing associated with exhaustion, SOB, dizziness. I rested for 30 minutes and attempted to dance again, but I was too tired so we went home. The chest pressure has been constant and has not subsided. I took .34m of xanax hoping it would help. It did not change anything, I tried to sleep and the pressure remained all through the night and all today. It has not worsened or gotten any better. Nothing makes pressure better. Movement and exertion make pressure worse. Everything makes me tired, even talking. I am just really tired." Recently began Voltaren.  Pressure is on the left side radiates up neck.

## 2012-02-13 DIAGNOSIS — R0789 Other chest pain: Secondary | ICD-10-CM | POA: Diagnosis not present

## 2012-02-13 DIAGNOSIS — F411 Generalized anxiety disorder: Secondary | ICD-10-CM | POA: Diagnosis not present

## 2012-03-02 DIAGNOSIS — M2550 Pain in unspecified joint: Secondary | ICD-10-CM | POA: Diagnosis not present

## 2012-03-02 DIAGNOSIS — M76899 Other specified enthesopathies of unspecified lower limb, excluding foot: Secondary | ICD-10-CM | POA: Diagnosis not present

## 2012-03-02 DIAGNOSIS — M064 Inflammatory polyarthropathy: Secondary | ICD-10-CM | POA: Diagnosis not present

## 2012-03-02 DIAGNOSIS — M1990 Unspecified osteoarthritis, unspecified site: Secondary | ICD-10-CM | POA: Diagnosis not present

## 2012-03-06 DIAGNOSIS — F411 Generalized anxiety disorder: Secondary | ICD-10-CM | POA: Diagnosis not present

## 2012-03-06 DIAGNOSIS — R0789 Other chest pain: Secondary | ICD-10-CM | POA: Diagnosis not present

## 2012-04-23 DIAGNOSIS — J209 Acute bronchitis, unspecified: Secondary | ICD-10-CM | POA: Diagnosis not present

## 2012-05-11 DIAGNOSIS — A088 Other specified intestinal infections: Secondary | ICD-10-CM | POA: Diagnosis not present

## 2012-05-11 DIAGNOSIS — R197 Diarrhea, unspecified: Secondary | ICD-10-CM | POA: Diagnosis not present

## 2012-05-12 DIAGNOSIS — Z961 Presence of intraocular lens: Secondary | ICD-10-CM | POA: Diagnosis not present

## 2012-05-12 DIAGNOSIS — H04129 Dry eye syndrome of unspecified lacrimal gland: Secondary | ICD-10-CM | POA: Diagnosis not present

## 2012-05-12 DIAGNOSIS — Z09 Encounter for follow-up examination after completed treatment for conditions other than malignant neoplasm: Secondary | ICD-10-CM | POA: Diagnosis not present

## 2012-05-12 DIAGNOSIS — H2510 Age-related nuclear cataract, unspecified eye: Secondary | ICD-10-CM | POA: Diagnosis not present

## 2012-06-03 DIAGNOSIS — M064 Inflammatory polyarthropathy: Secondary | ICD-10-CM | POA: Diagnosis not present

## 2012-06-03 DIAGNOSIS — M2550 Pain in unspecified joint: Secondary | ICD-10-CM | POA: Diagnosis not present

## 2012-06-03 DIAGNOSIS — Z79899 Other long term (current) drug therapy: Secondary | ICD-10-CM | POA: Diagnosis not present

## 2012-06-03 DIAGNOSIS — M1990 Unspecified osteoarthritis, unspecified site: Secondary | ICD-10-CM | POA: Diagnosis not present

## 2012-06-15 DIAGNOSIS — J40 Bronchitis, not specified as acute or chronic: Secondary | ICD-10-CM | POA: Diagnosis not present

## 2012-06-15 DIAGNOSIS — R05 Cough: Secondary | ICD-10-CM | POA: Diagnosis not present

## 2012-06-15 DIAGNOSIS — G479 Sleep disorder, unspecified: Secondary | ICD-10-CM | POA: Diagnosis not present

## 2012-06-24 DIAGNOSIS — Z800 Family history of malignant neoplasm of digestive organs: Secondary | ICD-10-CM | POA: Diagnosis not present

## 2012-06-24 DIAGNOSIS — K5730 Diverticulosis of large intestine without perforation or abscess without bleeding: Secondary | ICD-10-CM | POA: Diagnosis not present

## 2012-06-24 DIAGNOSIS — K219 Gastro-esophageal reflux disease without esophagitis: Secondary | ICD-10-CM | POA: Diagnosis not present

## 2012-06-24 DIAGNOSIS — Z09 Encounter for follow-up examination after completed treatment for conditions other than malignant neoplasm: Secondary | ICD-10-CM | POA: Diagnosis not present

## 2012-06-24 DIAGNOSIS — Z86010 Personal history of colonic polyps: Secondary | ICD-10-CM | POA: Diagnosis not present

## 2012-06-24 DIAGNOSIS — K5710 Diverticulosis of small intestine without perforation or abscess without bleeding: Secondary | ICD-10-CM | POA: Diagnosis not present

## 2012-06-24 DIAGNOSIS — K222 Esophageal obstruction: Secondary | ICD-10-CM | POA: Diagnosis not present

## 2012-06-24 DIAGNOSIS — K449 Diaphragmatic hernia without obstruction or gangrene: Secondary | ICD-10-CM | POA: Diagnosis not present

## 2012-06-24 DIAGNOSIS — D131 Benign neoplasm of stomach: Secondary | ICD-10-CM | POA: Diagnosis not present

## 2012-08-10 ENCOUNTER — Encounter: Payer: Self-pay | Admitting: *Deleted

## 2012-08-11 ENCOUNTER — Ambulatory Visit (INDEPENDENT_AMBULATORY_CARE_PROVIDER_SITE_OTHER): Payer: Medicare Other | Admitting: Internal Medicine

## 2012-08-11 ENCOUNTER — Encounter: Payer: Self-pay | Admitting: Internal Medicine

## 2012-08-11 VITALS — BP 116/64 | HR 81 | Ht 67.0 in | Wt 162.2 lb

## 2012-08-11 DIAGNOSIS — G4730 Sleep apnea, unspecified: Secondary | ICD-10-CM

## 2012-08-11 DIAGNOSIS — G4710 Hypersomnia, unspecified: Secondary | ICD-10-CM | POA: Diagnosis not present

## 2012-08-11 DIAGNOSIS — G4733 Obstructive sleep apnea (adult) (pediatric): Secondary | ICD-10-CM

## 2012-08-11 NOTE — Progress Notes (Signed)
08/11/12- 67 yo F never smoker  referred by Dr Leighton Ruff for loud snoring Separate rooms. Hx of nasal reconstruction surgery. No family hx of OSA. Good general health. Husband has sleep apnea so he is sensitive to her breathing. Right side is always stuffy so she is a mouth breather. Nasal reconstruction and turbinate reduction in the 1970s. Bedtime 11 PM to midnight, sleep latency maybe 30 minutes, waking 2 or 3 times before up between 5 AM and 8 AM. Benadryl for sleep. History of high blood pressure and allergic rhinitis. Several joint procedures. Denies history of heart disease or thyroid and little respiratory problem except that she gets asthma from tomato plants. Works as Therapist, sports in [place].  Prior to Admission medications   Medication Sig Start Date End Date Taking? Authorizing Provider  aspirin EC 81 MG tablet Take 81 mg by mouth every evening.   Yes Historical Provider, MD  brimonidine (ALPHAGAN P) 0.1 % SOLN Place 1 drop into both eyes every evening.   Yes Historical Provider, MD  diphenhydrAMINE (BENADRYL) 25 MG tablet Take 25 mg by mouth at bedtime.   Yes Historical Provider, MD  hydroxychloroquine (PLAQUENIL) 200 MG tablet Take 1 tablet by mouth daily. 07/16/12  Yes Historical Provider, MD  lansoprazole (PREVACID) 30 MG capsule Take 30 mg by mouth daily.   Yes Historical Provider, MD  LORazepam (ATIVAN) 1 MG tablet Take 1 tablet (1 mg total) by mouth 3 (three) times daily as needed for anxiety. 0/9/32  Yes Delora Fuel, MD  losartan-hydrochlorothiazide (HYZAAR) 50-12.5 MG per tablet Take 1 tablet by mouth every evening.   Yes Historical Provider, MD  meloxicam (MOBIC) 15 MG tablet Take 15 mg by mouth every evening.   Yes Historical Provider, MD  rosuvastatin (CRESTOR) 5 MG tablet Take 5 mg by mouth every evening.   Yes Historical Provider, MD  sertraline (ZOLOFT) 50 MG tablet Take 1 tablet by mouth daily. 07/29/12  Yes Historical Provider, MD   Past Medical History  Diagnosis Date  .  Hypertension   . Glaucoma   . Hypercholesteremia   . PVC (premature ventricular contraction)   . GERD (gastroesophageal reflux disease)   . Anxiety   . Seasonal allergies   . Arthritis    Past Surgical History  Procedure Laterality Date  . Cardiac catheterization  2011    clean cath  . Cardiovascular stress test    . Total abdominal hysterectomy  1976  . Appendectomy  1959  . Shoulder arthroscopy Left   . Knee arthroscopy Left   . Bunionectomy    . Nasal reconstruction  1976  . Cataract extraction w/ intraocular lens implant Right 2010   Family History  Problem Relation Age of Onset  . Heart disease Father   . Heart disease Mother   . Colon cancer Father    History   Social History  . Marital Status: Married    Spouse Name: N/A    Number of Children: N/A  . Years of Education: N/A   Occupational History  . RN     [employer]   Social History Main Topics  . Smoking status: Never Smoker   . Smokeless tobacco: Not on file  . Alcohol Use: No  . Drug Use: No  . Sexually Active:    Other Topics Concern  . 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 HEENT:   No-  headaches, difficulty swallowing, tooth/dental problems, sore throat,       No-  sneezing, itching, ear ache, nasal congestion, post nasal drip,  CV:  No-   chest pain, orthopnea, PND, swelling in lower extremities, anasarca,                                  dizziness, palpitations Resp: No-   shortness of breath with exertion or at rest.              No-   productive cough,  No non-productive cough,  No- coughing up of blood.              No-   change in color of mucus.  No- wheezing.   Skin: No-   rash or lesions. GI:  No-   +heartburn, +indigestion, abdominal pain, nausea, vomiting, diarrhea,                 change in bowel habits, loss of appetite GU: No-    dysuria, change in color of urine, no urgency or frequency.  No- flank pain. MS:  No-   joint pain or swelling.  No- decreased range of motion.  No- back pain. Neuro-     nothing unusual Psych:  No- change in mood or affect. No depression or anxiety.  No memory loss.  OBJ- Physical Exam General- Alert, Oriented, Affect-appropriate, Distress- none acute. Medium build Skin- rash-none, lesions- none, excoriation- none Lymphadenopathy- none Head- atraumatic            Eyes- Gross vision intact, PERRLA, conjunctivae and secretions clear            Ears- Hearing, canals-normal            Nose- Clear, + mild external deviation, +more narrow on the right, mucus, polyps, erosion, perforation             Throat- Mallampati II , mucosa clear , drainage- none, tonsils- atrophic Neck- flexible , trachea midline, no stridor , thyroid nl, carotid no bruit Chest - symmetrical excursion , unlabored           Heart/CV- RRR , no murmur , no gallop  , no rub, nl s1 s2                           - JVD- none , edema- none, stasis changes- none, varices- none           Lung- clear to P&A, wheeze- none, cough- none , dullness-none, rub- none           Chest wall-  Abd- tender-no, distended-no, bowel sounds-present, HSM- no Br/ Gen/ Rectal- Not done, not indicated Extrem- cyanosis- none, clubbing, none, atrophy- none, strength- nl Neuro- grossly intact to observation

## 2012-08-11 NOTE — Patient Instructions (Addendum)
Order- Schedule unattended home sleep study       Dx OSA   Please call as needed

## 2012-08-15 DIAGNOSIS — R0609 Other forms of dyspnea: Secondary | ICD-10-CM | POA: Diagnosis not present

## 2012-08-15 DIAGNOSIS — G4737 Central sleep apnea in conditions classified elsewhere: Secondary | ICD-10-CM | POA: Diagnosis not present

## 2012-08-15 DIAGNOSIS — R0989 Other specified symptoms and signs involving the circulatory and respiratory systems: Secondary | ICD-10-CM

## 2012-08-15 DIAGNOSIS — G4733 Obstructive sleep apnea (adult) (pediatric): Secondary | ICD-10-CM

## 2012-08-17 ENCOUNTER — Encounter: Payer: Self-pay | Admitting: Internal Medicine

## 2012-08-23 ENCOUNTER — Encounter: Payer: Self-pay | Admitting: Internal Medicine

## 2012-08-23 DIAGNOSIS — G4733 Obstructive sleep apnea (adult) (pediatric): Secondary | ICD-10-CM | POA: Insufficient documentation

## 2012-08-23 NOTE — Assessment & Plan Note (Signed)
History is suggestive of uncomplicated sleep apnea. Mild external deviation and narrowing of her nose may be important. Plan-she is an appropriate candidate for an unattended home sleep study as discussed

## 2012-09-04 DIAGNOSIS — M6530 Trigger finger, unspecified finger: Secondary | ICD-10-CM | POA: Diagnosis not present

## 2012-09-04 DIAGNOSIS — M19049 Primary osteoarthritis, unspecified hand: Secondary | ICD-10-CM | POA: Diagnosis not present

## 2012-09-15 DIAGNOSIS — I10 Essential (primary) hypertension: Secondary | ICD-10-CM | POA: Diagnosis not present

## 2012-09-15 DIAGNOSIS — Z79899 Other long term (current) drug therapy: Secondary | ICD-10-CM | POA: Diagnosis not present

## 2012-09-15 DIAGNOSIS — M064 Inflammatory polyarthropathy: Secondary | ICD-10-CM | POA: Diagnosis not present

## 2012-09-15 DIAGNOSIS — M25519 Pain in unspecified shoulder: Secondary | ICD-10-CM | POA: Diagnosis not present

## 2012-09-15 DIAGNOSIS — M2550 Pain in unspecified joint: Secondary | ICD-10-CM | POA: Diagnosis not present

## 2012-09-15 DIAGNOSIS — M1990 Unspecified osteoarthritis, unspecified site: Secondary | ICD-10-CM | POA: Diagnosis not present

## 2012-09-15 DIAGNOSIS — K589 Irritable bowel syndrome without diarrhea: Secondary | ICD-10-CM | POA: Diagnosis not present

## 2012-09-15 DIAGNOSIS — E7800 Pure hypercholesterolemia, unspecified: Secondary | ICD-10-CM | POA: Diagnosis not present

## 2012-09-15 DIAGNOSIS — E559 Vitamin D deficiency, unspecified: Secondary | ICD-10-CM | POA: Diagnosis not present

## 2012-09-15 DIAGNOSIS — K219 Gastro-esophageal reflux disease without esophagitis: Secondary | ICD-10-CM | POA: Diagnosis not present

## 2012-09-18 ENCOUNTER — Ambulatory Visit (INDEPENDENT_AMBULATORY_CARE_PROVIDER_SITE_OTHER): Payer: Medicare Other | Admitting: Internal Medicine

## 2012-09-18 ENCOUNTER — Encounter: Payer: Self-pay | Admitting: Internal Medicine

## 2012-09-18 VITALS — BP 120/63 | HR 78 | Ht 67.0 in | Wt 163.0 lb

## 2012-09-18 DIAGNOSIS — G4733 Obstructive sleep apnea (adult) (pediatric): Secondary | ICD-10-CM

## 2012-09-18 NOTE — Progress Notes (Signed)
08/11/12- 67 yo F never smoker  referred by Dr Leighton Ruff for loud snoring Separate rooms. Hx of nasal reconstruction surgery. No family hx of OSA. Good general health. Husband has sleep apnea so he is sensitive to her breathing. Right side is always stuffy so she is a mouth breather. Nasal reconstruction and turbinate reduction in the 1970s. Bedtime 11 PM to midnight, sleep latency maybe 30 minutes, waking 2 or 3 times before up between 5 AM and 8 AM. Benadryl for sleep. History of high blood pressure and allergic rhinitis. Several joint procedures. Denies history of heart disease or thyroid and little respiratory problem except that she gets asthma from tomato plants. Works as Therapist, sports in [place].  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 NPSG 08/11/12- unattended home sleep study-moderate obstructive and central sleep apnea, AHI 29.8 per hour, desaturation to 76% snoring.  ROS-see HPI Constitutional:   No-   weight loss, night sweats, fevers, chills, +fatigue, lassitude. HEENT:   No-  headaches, difficulty swallowing, tooth/dental problems, sore throat,       No-  sneezing, itching, ear ache, nasal congestion, post nasal drip,  CV:  No-   chest pain, orthopnea, PND, swelling in lower extremities, anasarca, dizziness, palpitations Resp: No-   shortness of breath with exertion or at rest.              No-   productive cough,  No non-productive cough,  No- coughing up of blood.              No-   change in color of mucus.  No- wheezing.   Skin: No-   rash or lesions. GI:  No-   +heartburn, +indigestion, abdominal pain, nausea, vomiting, GU:  MS:  No-   joint pain or swelling. Neuro-     nothing unusual Psych:  No- change in mood or affect. No depression or anxiety.  No memory loss.  OBJ- Physical Exam General- Alert, Oriented, Affect-appropriate, Distress- none acute. Medium build Skin- rash-none,  lesions- none, excoriation- none Lymphadenopathy- none Head- atraumatic            Eyes- Gross vision intact, PERRLA, conjunctivae and secretions clear            Ears- Hearing, canals-normal            Nose- Clear, + mild external deviation, +more narrow on the right, mucus, polyps, erosion, perforation             Throat- Mallampati II , mucosa clear , drainage- none, tonsils- atrophic Neck- flexible , trachea midline, no stridor , thyroid nl, carotid no bruit Chest - symmetrical excursion , unlabored           Heart/CV- RRR , no murmur , no gallop  , no rub, nl s1 s2                           - JVD- none , edema- none, stasis changes- none, varices- none           Lung- clear to P&A, wheeze- none, cough- none , dullness-none, rub- none           Chest wall-  Abd-  Br/ Gen/ Rectal- Not done, not indicated Extrem- cyanosis- none, clubbing, none, atrophy- none, strength- nl Neuro- grossly intact to observation

## 2012-09-18 NOTE — Patient Instructions (Addendum)
Order- new CPAP auto 5-15  x 7 days for pressure recommendation, mask of choice, humidifier, supplies    Dx OSA  Please call as needed

## 2012-10-09 NOTE — Assessment & Plan Note (Addendum)
Moderate obstructive and central sleep apnea,  We discussed sleep hygiene, medical concerns of sleep apnea, driving responsibility, treatment choices. Plan-start CPAP

## 2012-11-05 DIAGNOSIS — M25519 Pain in unspecified shoulder: Secondary | ICD-10-CM | POA: Diagnosis not present

## 2012-11-18 DIAGNOSIS — Z961 Presence of intraocular lens: Secondary | ICD-10-CM | POA: Diagnosis not present

## 2012-11-18 DIAGNOSIS — H4011X0 Primary open-angle glaucoma, stage unspecified: Secondary | ICD-10-CM | POA: Diagnosis not present

## 2012-11-18 DIAGNOSIS — H2510 Age-related nuclear cataract, unspecified eye: Secondary | ICD-10-CM | POA: Diagnosis not present

## 2012-11-18 DIAGNOSIS — H04129 Dry eye syndrome of unspecified lacrimal gland: Secondary | ICD-10-CM | POA: Diagnosis not present

## 2012-11-18 DIAGNOSIS — Z09 Encounter for follow-up examination after completed treatment for conditions other than malignant neoplasm: Secondary | ICD-10-CM | POA: Diagnosis not present

## 2012-11-18 DIAGNOSIS — M069 Rheumatoid arthritis, unspecified: Secondary | ICD-10-CM | POA: Diagnosis not present

## 2012-11-18 DIAGNOSIS — Z049 Encounter for examination and observation for unspecified reason: Secondary | ICD-10-CM | POA: Diagnosis not present

## 2012-11-19 ENCOUNTER — Ambulatory Visit (INDEPENDENT_AMBULATORY_CARE_PROVIDER_SITE_OTHER): Payer: Medicare Other | Admitting: Internal Medicine

## 2012-11-19 ENCOUNTER — Encounter: Payer: Self-pay | Admitting: Internal Medicine

## 2012-11-19 VITALS — BP 112/60 | HR 76 | Ht 67.0 in | Wt 166.4 lb

## 2012-11-19 DIAGNOSIS — R6884 Jaw pain: Secondary | ICD-10-CM | POA: Diagnosis not present

## 2012-11-19 DIAGNOSIS — G4733 Obstructive sleep apnea (adult) (pediatric): Secondary | ICD-10-CM

## 2012-11-19 NOTE — Progress Notes (Signed)
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 Hx of nasal reconstruction surgery. No family hx of OSA. Good general health. Husband has sleep apnea so he is sensitive to her breathing. Right side is always stuffy so she is a mouth breather. Nasal reconstruction and turbinate reduction in the 1970s. Bedtime 11 PM to midnight, sleep latency maybe 30 minutes, waking 2 or 3 times before up between 5 AM and 8 AM. Benadryl for sleep. History of high blood pressure and allergic rhinitis. Several joint procedures. Denies history of heart disease or thyroid and little respiratory problem except that she gets asthma from tomato plants. Works as Therapist, sports in [place].  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 NPSG 08/11/12- unattended home sleep study-moderate obstructive and central sleep apnea, AHI 29.8 per hour, desaturation to 76% snoring.  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 6 hrs. a night,pr. good,mask good,c/o sore jaw left x 7-8-wks.,nose dryness,no tenderness in sinuses,left ear hurts when jaw hurts CPAP autotitration 5-15 cwp/ [facility], nasal mask, humidifier.  She says the pressure has felt fine. Mask seems to be okay. Does not recognize bruxism. Having persistent pain at the angle of the left jaw, now limiting mouth opening. No recognized pain. Hearing seems okay. Some nasal dryness. History of osteoarthritis/ Dr Trudie Reed.  ROS-see HPI Constitutional:   No-   weight loss, night sweats, fevers, chills, +fatigue, lassitude. HEENT:   No-  headaches, difficulty swallowing, tooth/dental problems, sore throat, + pain L jaw      No-  sneezing, itching, ear ache, nasal congestion, post nasal drip,  CV:  No-   chest pain, orthopnea, PND, swelling in lower extremities,  anasarca, dizziness, palpitations Resp: No-   shortness of breath with exertion or at rest.              No-   productive cough,  No non-productive cough,  No- coughing up of blood.              No-   change in color of mucus.  No- wheezing.   Skin: No-   rash or lesions. GI:  No-   +heartburn, +indigestion, abdominal pain, nausea, vomiting, GU:  MS:  No-   joint pain or swelling. Neuro-     nothing unusual Psych:  No- change in mood or affect. No depression or anxiety.  No memory loss.  OBJ- Physical Exam General- Alert, Oriented, Affect-appropriate, Distress- none acute. Medium build Skin- rash-none, lesions- none, excoriation- none Lymphadenopathy- none Head- atraumatic            Eyes- Gross vision intact, PERRLA, conjunctivae and secretions clear            Ears- Hearing, canals-normal. + mild retraction L TM            Nose- Clear, + mild external deviation, +more narrow on the right,  No-mucus, polyps, erosion, perforation             Throat- Mallampati II , mucosa clear , drainage- none, tonsils- atrophic. Teeth and oropharynx                  seem ok. Neck- flexible , trachea midline, no stridor , thyroid nl, carotid no  bruit Chest - symmetrical excursion , unlabored           Heart/CV- RRR , no murmur , no gallop  , no rub, nl s1 s2                           - JVD- none , edema- none, stasis changes- none, varices- none           Lung- clear to P&A, wheeze- none, cough- none , dullness-none, rub- none           Chest wall-  Abd-  Br/ Gen/ Rectal- Not done, not indicated Extrem- cyanosis- none, clubbing, none, atrophy- none, strength- nl Neuro- grossly intact to observation

## 2012-11-19 NOTE — Assessment & Plan Note (Signed)
Awaiting download from [facility] for pressure decisions. Compliance is good

## 2012-11-19 NOTE — Patient Instructions (Signed)
Order- referral to Oral Surgeon- Dr Georgina Snell group Left jaw pain

## 2012-11-19 NOTE — Assessment & Plan Note (Signed)
Probably TMJ. I have not seen that in association with CPAP use. I suppose something about wearing CPAP could be causing her to have bruxism does not recognize. Pressure into the nasopharynx might cause discomfort along the left eustachian tube. She denies nasal congestion or trouble with hearing. Plan-she asks that I refer her to an oral surgeon, saying that her dentist didn't offer anything

## 2012-12-01 ENCOUNTER — Encounter: Payer: Self-pay | Admitting: Internal Medicine

## 2012-12-16 DIAGNOSIS — R5381 Other malaise: Secondary | ICD-10-CM | POA: Diagnosis not present

## 2012-12-16 DIAGNOSIS — M26609 Unspecified temporomandibular joint disorder, unspecified side: Secondary | ICD-10-CM | POA: Diagnosis not present

## 2012-12-16 DIAGNOSIS — M1990 Unspecified osteoarthritis, unspecified site: Secondary | ICD-10-CM | POA: Diagnosis not present

## 2012-12-16 DIAGNOSIS — M064 Inflammatory polyarthropathy: Secondary | ICD-10-CM | POA: Diagnosis not present

## 2012-12-16 DIAGNOSIS — Z79899 Other long term (current) drug therapy: Secondary | ICD-10-CM | POA: Diagnosis not present

## 2012-12-16 DIAGNOSIS — M2550 Pain in unspecified joint: Secondary | ICD-10-CM | POA: Diagnosis not present

## 2012-12-30 DIAGNOSIS — H401290 Low-tension glaucoma, unspecified eye, stage unspecified: Secondary | ICD-10-CM | POA: Diagnosis not present

## 2012-12-30 DIAGNOSIS — Z961 Presence of intraocular lens: Secondary | ICD-10-CM | POA: Diagnosis not present

## 2012-12-30 DIAGNOSIS — H2510 Age-related nuclear cataract, unspecified eye: Secondary | ICD-10-CM | POA: Diagnosis not present

## 2013-01-20 DIAGNOSIS — R7989 Other specified abnormal findings of blood chemistry: Secondary | ICD-10-CM | POA: Diagnosis not present

## 2013-02-04 HISTORY — PX: CATARACT EXTRACTION: SUR2

## 2013-03-15 DIAGNOSIS — J029 Acute pharyngitis, unspecified: Secondary | ICD-10-CM | POA: Diagnosis not present

## 2013-03-17 DIAGNOSIS — R509 Fever, unspecified: Secondary | ICD-10-CM | POA: Diagnosis not present

## 2013-03-22 ENCOUNTER — Ambulatory Visit (INDEPENDENT_AMBULATORY_CARE_PROVIDER_SITE_OTHER): Payer: Medicare Other | Admitting: Internal Medicine

## 2013-03-22 ENCOUNTER — Encounter: Payer: Self-pay | Admitting: Internal Medicine

## 2013-03-22 VITALS — BP 120/68 | HR 95 | Ht 67.0 in | Wt 165.6 lb

## 2013-03-22 DIAGNOSIS — G4733 Obstructive sleep apnea (adult) (pediatric): Secondary | ICD-10-CM | POA: Diagnosis not present

## 2013-03-22 DIAGNOSIS — J45909 Unspecified asthma, uncomplicated: Secondary | ICD-10-CM | POA: Diagnosis not present

## 2013-03-22 DIAGNOSIS — R6884 Jaw pain: Secondary | ICD-10-CM

## 2013-03-22 DIAGNOSIS — S0300XA Dislocation of jaw, unspecified side, initial encounter: Secondary | ICD-10-CM

## 2013-03-22 NOTE — Progress Notes (Signed)
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 Hx of nasal reconstruction surgery. No family hx of OSA. Good general health. Husband has sleep apnea so he is sensitive to her breathing. Right side is always stuffy so she is a mouth breather. Nasal reconstruction and turbinate reduction in the 1970s. Bedtime 11 PM to midnight, sleep latency maybe 30 minutes, waking 2 or 3 times before up between 5 AM and 8 AM. Benadryl for sleep. History of high blood pressure and allergic rhinitis. Several joint procedures. Denies history of heart disease or thyroid and little respiratory problem except that she gets asthma from tomato plants. Works as Therapist, sports in [place].  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 NPSG 08/11/12- unattended home sleep study-moderate obstructive and central sleep apnea, AHI 29.8 per hour, desaturation to 76% snoring.  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 6 hrs. a night,pr. good,mask good,c/o sore jaw left x 7-8-wks.,nose dryness,no tenderness in sinuses,left ear hurts when jaw hurts CPAP autotitration 5-15 cwp/ Advanced, nasal mask, humidifier.  She says the pressure has felt fine. Mask seems to be okay. Does not recognize bruxism. Having persistent pain at the angle of the left jaw, now limiting mouth opening. No recognized pain. Hearing seems okay. Some nasal dryness. History of osteoarthritis/ Dr Trudie Reed.  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  Pt stopped levaquin after 3rd day due to nausea Still coughing. Pred> too nervous. Took amox, then levaquin, then quit due to nausea. Bronchitis resolved, still dry cough.Lost  10 lbs, poor appetite.Not wearing CPAP- thinks it might bother her face. C/O pain w/ chewing just under L ear.  ROS-see HPI Constitutional:   No-   weight loss, night sweats, fevers, chills, +fatigue, lassitude. HEENT:   No-  headaches, difficulty swallowing, tooth/dental problems, sore throat, + pain L jaw      No-  sneezing, itching, ear ache, nasal congestion, post nasal drip,  CV:  No-   chest pain, orthopnea, PND, swelling in lower extremities, anasarca, dizziness, palpitations Resp: No-   shortness of breath with exertion or at rest.              No-   productive cough,  +non-productive cough,  No- coughing up of blood.              No-   change in color of mucus.  No- wheezing.   Skin: No-   rash or lesions. GI:  No-   +heartburn, +indigestion, abdominal pain, nausea, vomiting, GU:  MS:  No-   joint pain or swelling. Neuro-     nothing unusual Psych:  No- change in mood or affect. No depression or anxiety.  No memory loss.  OBJ- Physical Exam General- Alert, Oriented, Affect-appropriate, Distress- none acute. Medium build Skin- rash-none, lesions- none, excoriation- none Lymphadenopathy- none Head- atraumatic            Eyes- Gross vision intact, PERRLA, conjunctivae and secretions clear            Ears- Hearing, canals-normal. + mild retraction L TM  Nose- Clear, + mild external deviation, +more narrow on the right,  No-mucus, polyps,                     erosion, perforation             Throat- Mallampati II , mucosa clear , drainage- none, tonsils- atrophic. Teeth and                                 oropharynx seem ok. Neck- flexible , trachea midline, no stridor , thyroid nl, carotid no bruit Chest - symmetrical excursion , unlabored           Heart/CV- RRR , no murmur , no gallop  , no rub, nl s1 s2                           - JVD- none , edema- none, stasis changes- none, varices- none           Lung- +fine crackles, wheeze- none, cough-+dry , dullness-none, rub-  none           Chest wall-  Abd-  Br/ Gen/ Rectal- Not done, not indicated Extrem- cyanosis- none, clubbing, none, atrophy- none, strength- nl Neuro- grossly intact to observation

## 2013-03-22 NOTE — Patient Instructions (Signed)
Order- referral to Dr Oneal Grout, orthodontist    Dx TMJ, OSA  Sample Breo Ellipta     1 puff then rinse mouth, once daily     See if this reduces your cough

## 2013-03-29 DIAGNOSIS — M79609 Pain in unspecified limb: Secondary | ICD-10-CM | POA: Diagnosis not present

## 2013-04-05 ENCOUNTER — Telehealth: Payer: Self-pay | Admitting: Internal Medicine

## 2013-04-05 DIAGNOSIS — M2550 Pain in unspecified joint: Secondary | ICD-10-CM | POA: Diagnosis not present

## 2013-04-05 DIAGNOSIS — M26609 Unspecified temporomandibular joint disorder, unspecified side: Secondary | ICD-10-CM | POA: Diagnosis not present

## 2013-04-05 DIAGNOSIS — M064 Inflammatory polyarthropathy: Secondary | ICD-10-CM | POA: Diagnosis not present

## 2013-04-05 DIAGNOSIS — M1990 Unspecified osteoarthritis, unspecified site: Secondary | ICD-10-CM | POA: Diagnosis not present

## 2013-04-05 NOTE — Telephone Encounter (Signed)
Pt is aware of CY's recs. Will call if her symptoms reoccur. Nothing further is needed at this time.

## 2013-04-05 NOTE — Telephone Encounter (Signed)
Spoke with pt. Reports after using Breo for 14 days, her breathing and cough have improved. She is wondering if she needs to continue this medication.  Allergies  Allergen Reactions  . Sulfamethoxazole-Trimethoprim Rash   Current Outpatient Prescriptions on File Prior to Visit  Medication Sig Dispense Refill  . aspirin EC 81 MG tablet Take 81 mg by mouth every evening.      . brimonidine (ALPHAGAN P) 0.1 % SOLN Place 1 drop into both eyes every evening.      . cetirizine (ZYRTEC) 10 MG tablet Take 10 mg by mouth. Take 1 tablet daily as needed      . diphenhydrAMINE (BENADRYL) 25 MG tablet Take 25 mg by mouth at bedtime.      . lansoprazole (PREVACID) 30 MG capsule Take 30 mg by mouth daily.      Marland Kitchen LORazepam (ATIVAN) 1 MG tablet Take 1 tablet (1 mg total) by mouth 3 (three) times daily as needed for anxiety.  15 tablet  0  . losartan-hydrochlorothiazide (HYZAAR) 50-12.5 MG per tablet Take 1 tablet by mouth every evening.      . meloxicam (MOBIC) 15 MG tablet Take 15 mg by mouth every evening.      . rosuvastatin (CRESTOR) 5 MG tablet Take 5 mg by mouth every evening.      . sertraline (ZOLOFT) 50 MG tablet Take 1 tablet by mouth daily.       No current facility-administered medications on file prior to visit.   CY - please advise. Thanks.

## 2013-04-05 NOTE — Telephone Encounter (Signed)
She can finish what she has, then stop and watch.

## 2013-04-05 NOTE — Telephone Encounter (Signed)
lmtcb x1 

## 2013-04-08 DIAGNOSIS — R439 Unspecified disturbances of smell and taste: Secondary | ICD-10-CM | POA: Diagnosis not present

## 2013-04-08 NOTE — Assessment & Plan Note (Signed)
Acute bronchitis/ bronchiolitis Plan Sample Breo ellipta

## 2013-04-08 NOTE — Assessment & Plan Note (Signed)
Think this is TMJ area, not tooth

## 2013-04-08 NOTE — Assessment & Plan Note (Signed)
She has not been comfortable with CPAP. Question if some discomfort might be TMJ pain. Plan- referral to Dr Ron Parker to evaluate for TMJ and for consideration of oral appliance for OSA instead of CPAP.

## 2013-04-21 DIAGNOSIS — M19049 Primary osteoarthritis, unspecified hand: Secondary | ICD-10-CM | POA: Diagnosis not present

## 2013-04-29 DIAGNOSIS — M069 Rheumatoid arthritis, unspecified: Secondary | ICD-10-CM | POA: Diagnosis not present

## 2013-04-29 DIAGNOSIS — H2510 Age-related nuclear cataract, unspecified eye: Secondary | ICD-10-CM | POA: Diagnosis not present

## 2013-04-29 DIAGNOSIS — Z049 Encounter for examination and observation for unspecified reason: Secondary | ICD-10-CM | POA: Diagnosis not present

## 2013-04-29 DIAGNOSIS — H401290 Low-tension glaucoma, unspecified eye, stage unspecified: Secondary | ICD-10-CM | POA: Diagnosis not present

## 2013-04-29 DIAGNOSIS — Z09 Encounter for follow-up examination after completed treatment for conditions other than malignant neoplasm: Secondary | ICD-10-CM | POA: Diagnosis not present

## 2013-04-29 DIAGNOSIS — Z961 Presence of intraocular lens: Secondary | ICD-10-CM | POA: Diagnosis not present

## 2013-05-20 ENCOUNTER — Ambulatory Visit (INDEPENDENT_AMBULATORY_CARE_PROVIDER_SITE_OTHER): Payer: Medicare Other | Admitting: Internal Medicine

## 2013-05-20 ENCOUNTER — Encounter: Payer: Self-pay | Admitting: Internal Medicine

## 2013-05-20 VITALS — BP 126/68 | HR 72 | Ht 67.0 in | Wt 172.0 lb

## 2013-05-20 DIAGNOSIS — G4733 Obstructive sleep apnea (adult) (pediatric): Secondary | ICD-10-CM

## 2013-05-20 DIAGNOSIS — Z9989 Dependence on other enabling machines and devices: Principal | ICD-10-CM

## 2013-05-20 NOTE — Progress Notes (Signed)
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 Hx of nasal reconstruction surgery. No family hx of OSA. Good general health. Husband has sleep apnea so he is sensitive to her breathing. Right side is always stuffy so she is a mouth breather. Nasal reconstruction and turbinate reduction in the 1970s. Bedtime 11 PM to midnight, sleep latency maybe 30 minutes, waking 2 or 3 times before up between 5 AM and 8 AM. Benadryl for sleep. History of high blood pressure and allergic rhinitis. Several joint procedures. Denies history of heart disease or thyroid and little respiratory problem except that she gets asthma from tomato plants. Works as Therapist, sports in [place].  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 NPSG 08/11/12- unattended home sleep study-moderate obstructive and central sleep apnea, AHI 29.8 per hour, desaturation to 76% snoring.  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 6 hrs. a night,pr. good,mask good,c/o sore jaw left x 7-8-wks.,nose dryness,no tenderness in sinuses,left ear hurts when jaw hurts CPAP autotitration 5-15 cwp/ Advanced, nasal mask, humidifier.  She says the pressure has felt fine. Mask seems to be okay. Does not recognize bruxism. Having persistent pain at the angle of the left jaw, now limiting mouth opening. No recognized pain. Hearing seems okay. Some nasal dryness. History of osteoarthritis/ Dr Trudie Reed.  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  Pt stopped levaquin after 3rd day due to nausea Still coughing. Pred> too nervous. Took amox, then levaquin, then quit due to nausea. Bronchitis resolved, still dry cough.Lost  10 lbs, poor appetite.Not wearing CPAP- thinks it might bother her face. C/O pain w/ chewing just under L ear.  05/20/13- 33 yo F  never smoker,  RN, followed for OSA, complicated by HBP, CAD      PCP Dr Lucia Gaskins Pt wears cpap AutoPAP 5-15/ Advanced about 6 hours nightly.  Has no problems with mask or supplies.   CPAP seems comfortable and she doesn't recognize sleep problem. Would like CPAP to start a little higher pressure when she first goes to bed. Complains of lack of energy x 3 months- weight up 7 lbs since August. No chest pain. R foot swells a little at times. Easier DOE. Not really sleepy. No acute event. Not known anemic. Feels a little weaker L side. To check this with Dr Drema Dallas.   ROS-see HPI Constitutional:   No-   weight loss, night sweats, fevers, chills, +fatigue, lassitude. HEENT:   No-  headaches, difficulty swallowing, tooth/dental problems, sore throat,  pain L jaw      No-  sneezing, itching, ear ache, nasal congestion, post nasal drip,  CV:  No-   chest pain, orthopnea, PND, swelling in lower extremities, anasarca, dizziness, palpitations Resp: + shortness of breath with exertion or at rest.              No-   productive cough,  +non-productive cough,  No- coughing up of blood.              No-   change in color of mucus.  No- wheezing.  Skin: No-   rash or lesions. GI:  No-   +heartburn, +indigestion, abdominal pain, nausea, vomiting, GU:  MS:  No-   joint pain or swelling. Neuro-     nothing unusual Psych:  No- change in mood or affect. No depression or anxiety.  No memory loss.  OBJ- Physical Exam General- Alert, Oriented, Affect-appropriate, Distress- none acute. Medium build Skin- rash-none, lesions- none, excoriation- none Lymphadenopathy- none Head- atraumatic            Eyes- Gross vision intact, PERRLA, conjunctivae and secretions clear            Ears- Hearing, canals-normal. + mild retraction L TM            Nose- Clear, + mild external deviation,  +more narrow on the right,  No-mucus, polyps,                     erosion, perforation             Throat- Mallampati II , mucosa clear , drainage- none, tonsils- atrophic. Teeth and                                 oropharynx seem ok. Neck- flexible , trachea midline, no stridor , thyroid nl, carotid no bruit Chest - symmetrical excursion , unlabored           Heart/CV- RRR , no murmur , no gallop  , no rub, nl s1 s2                           - JVD- none , edema- none, stasis changes- none, varices- none           Lung- clear, unlabored, wheeze- none, cough-none , dullness-none, rub- none           Chest wall-  Abd-  Br/ Gen/ Rectal- Not done, not indicated Extrem- cyanosis- none, clubbing, none, atrophy- none, strength- nl Neuro- grossly intact to observation

## 2013-05-20 NOTE — Patient Instructions (Signed)
Order- DME Advanced - download for pressure recommendation              Change AutoPAP range to 8-15

## 2013-05-20 NOTE — Assessment & Plan Note (Signed)
Discussed fixed vs autotpap settings.  I don't think her easy fatigue is sleepiness or dyspnea. Lungs clear and sat 98%. Consider depression, deconditioning, heart.  Plan- raise low end of her auto from 5 to 8. Ask for pressure download. Suggest she have her PCP check routine labs for organic basis for fatigue/ dyspnea complaint.

## 2013-05-28 DIAGNOSIS — L570 Actinic keratosis: Secondary | ICD-10-CM | POA: Diagnosis not present

## 2013-05-28 DIAGNOSIS — L821 Other seborrheic keratosis: Secondary | ICD-10-CM | POA: Diagnosis not present

## 2013-05-28 DIAGNOSIS — L820 Inflamed seborrheic keratosis: Secondary | ICD-10-CM | POA: Diagnosis not present

## 2013-06-03 DIAGNOSIS — M2040 Other hammer toe(s) (acquired), unspecified foot: Secondary | ICD-10-CM | POA: Diagnosis not present

## 2013-06-03 DIAGNOSIS — M2010 Hallux valgus (acquired), unspecified foot: Secondary | ICD-10-CM | POA: Diagnosis not present

## 2013-07-05 ENCOUNTER — Encounter (HOSPITAL_COMMUNITY): Payer: Self-pay | Admitting: Emergency Medicine

## 2013-07-05 ENCOUNTER — Emergency Department (INDEPENDENT_AMBULATORY_CARE_PROVIDER_SITE_OTHER): Payer: Medicare Other

## 2013-07-05 ENCOUNTER — Emergency Department (INDEPENDENT_AMBULATORY_CARE_PROVIDER_SITE_OTHER)
Admission: EM | Admit: 2013-07-05 | Discharge: 2013-07-05 | Disposition: A | Discharge status: Home / Self Care | Payer: Medicare Other | Source: Home / Self Care | Attending: Emergency Medicine | Admitting: Emergency Medicine

## 2013-07-05 DIAGNOSIS — R071 Chest pain on breathing: Secondary | ICD-10-CM

## 2013-07-05 DIAGNOSIS — R0789 Other chest pain: Secondary | ICD-10-CM

## 2013-07-05 DIAGNOSIS — W19XXXA Unspecified fall, initial encounter: Secondary | ICD-10-CM | POA: Diagnosis not present

## 2013-07-05 DIAGNOSIS — Y92009 Unspecified place in unspecified non-institutional (private) residence as the place of occurrence of the external cause: Secondary | ICD-10-CM

## 2013-07-05 DIAGNOSIS — M940 Chondrocostal junction syndrome [Tietze]: Secondary | ICD-10-CM

## 2013-07-05 DIAGNOSIS — W010XXA Fall on same level from slipping, tripping and stumbling without subsequent striking against object, initial encounter: Secondary | ICD-10-CM

## 2013-07-05 DIAGNOSIS — R079 Chest pain, unspecified: Secondary | ICD-10-CM | POA: Diagnosis not present

## 2013-07-05 MED ORDER — HYDROCODONE-ACETAMINOPHEN 5-325 MG PO TABS: 1.0000 | ORAL_TABLET | Freq: Four times a day (QID) | ORAL | Status: DC | PRN

## 2013-07-05 NOTE — ED Notes (Signed)
Pt c/o chest pain/soreness onset 6 days Reports she tripped over shoe box and hit carpet flooring Bruising to left arm and face Reports pain in chest increases w/activity and deep breaths Denies LOC; alert w/no signs of acute distress.

## 2013-07-05 NOTE — Discharge Instructions (Signed)
Chest Wall Pain Chest wall pain is pain in or around the bones and muscles of your chest. It may take up to 6 weeks to get better. It may take longer if you must stay physically active in your work and activities.  CAUSES  Chest wall pain may happen on its own. However, it may be caused by:  A viral illness like the flu.  Injury.  Coughing.  Exercise.  Arthritis.  Fibromyalgia.  Shingles. HOME CARE INSTRUCTIONS   Avoid overtiring physical activity. Try not to strain or perform activities that cause pain. This includes any activities using your chest or your abdominal and side muscles, especially if heavy weights are used.  Put ice on the sore area.  Put ice in a plastic bag.  Place a towel between your skin and the bag.  Leave the ice on for 15-20 minutes per hour while awake for the first 2 days.  Only take over-the-counter or prescription medicines for pain, discomfort, or fever as directed by your caregiver. SEEK IMMEDIATE MEDICAL CARE IF:   Your pain increases, or you are very uncomfortable.  You have a fever.  Your chest pain becomes worse.  You have new, unexplained symptoms.  You have nausea or vomiting.  You feel sweaty or lightheaded.  You have a cough with phlegm (sputum), or you cough up blood. MAKE SURE YOU:   Understand these instructions.  Will watch your condition.  Will get help right away if you are not doing well or get worse. Document Released: 01/21/2005 Document Revised: 04/15/2011 Document Reviewed: 09/17/2010 [facility] Patient Information 2014 [place], [place].  Costochondritis Costochondritis, sometimes called Tietze syndrome, is a swelling and irritation (inflammation) of the tissue (cartilage) that connects your ribs with your breastbone (sternum). It causes pain in the chest and rib area. Costochondritis usually goes away on its own over time. It can take up to 6 weeks or longer to get better, especially if you are unable to limit your  activities. CAUSES  Some cases of costochondritis have no known cause. Possible causes include:  Injury (trauma).  Exercise or activity such as lifting.  Severe coughing. SIGNS AND SYMPTOMS  Pain and tenderness in the chest and rib area.  Pain that gets worse when coughing or taking deep breaths.  Pain that gets worse with specific movements. DIAGNOSIS  Your health care provider will do a physical exam and ask about your symptoms. Chest X-rays or other tests may be done to rule out other problems. TREATMENT  Costochondritis usually goes away on its own over time. Your health care provider may prescribe medicine to help relieve pain. HOME CARE INSTRUCTIONS   Avoid exhausting physical activity. Try not to strain your ribs during normal activity. This would include any activities using chest, abdominal, and side muscles, especially if heavy weights are used.  Apply ice to the affected area for the first 2 days after the pain begins.  Put ice in a plastic bag.  Place a towel between your skin and the bag.  Leave the ice on for 20 minutes, 2 3 times a day.  Only take over-the-counter or prescription medicines as directed by your health care provider. SEEK MEDICAL CARE IF:  You have redness or swelling at the rib joints. These are signs of infection.  Your pain does not go away despite rest or medicine. SEEK IMMEDIATE MEDICAL CARE IF:   Your pain increases or you are very uncomfortable.  You have shortness of breath or difficulty breathing.  You  cough up blood.  You have worse chest pains, sweating, or vomiting.  You have a fever or persistent symptoms for more than 2 3 days.  You have a fever and your symptoms suddenly get worse. MAKE SURE YOU:   Understand these instructions.  Will watch your condition.  Will get help right away if you are not doing well or get worse. Document Released: 10/31/2004 Document Revised: 11/11/2012 Document Reviewed:  08/25/2012 [facility] Patient Information 2014 [place].

## 2013-07-05 NOTE — ED Provider Notes (Signed)
CSN: [id number]     Arrival date & time 07/05/13  0809 History   First MD Initiated Contact with Patient 07/05/13 0831     Chief Complaint  Patient presents with  . Chest Pain   (Consider location/radiation/quality/duration/timing/severity/associated sxs/prior Treatment) HPI Comments: 68 year old female presents complaining of left sided rib cage pain. She fell in her closet 6 days ago after tripping over a box of shoes. She had immediate pain in the left upper anterior chest wall and developed a bruise in this area the next day. The pain persists so she wants to make sure she did not break a rib. The pain is increased with taking a deep breath and is somewhat relieved by holding firm pressure over her left chest when she breathes. The area is still tender to touch and she believes there is still a bruise. She denies shortness of breath, nausea, vomiting. She has a history of coronary artery disease, she had a cath in 2011 that showed a mild 30% LAD stenosis. She denies any pain within her chest, she says she just has pain on the chest wall in the area where she fell.  Patient is a 68 y.o. female presenting with chest pain.  Chest Pain   Past Medical History  Diagnosis Date  . Hypertension   . Glaucoma   . Hypercholesteremia   . PVC (premature ventricular contraction)   . GERD (gastroesophageal reflux disease)   . Anxiety   . Seasonal allergies   . Arthritis    Past Surgical History  Procedure Laterality Date  . Cardiac catheterization  2011    clean cath  . Cardiovascular stress test    . Total abdominal hysterectomy  1976  . Appendectomy  1959  . Shoulder arthroscopy Left   . Knee arthroscopy Left   . Bunionectomy    . Nasal reconstruction  1976  . Cataract extraction w/ intraocular lens implant Right 2010   Family History  Problem Relation Age of Onset  . Heart disease Father   . Colon cancer Father   . Heart disease Mother    History  Substance Use Topics  . Smoking  status: Never Smoker   . Smokeless tobacco: Not on file  . Alcohol Use: No   OB History   Grav Para Term Preterm Abortions TAB SAB Ect Mult Living                 Review of Systems  Cardiovascular: Positive for chest pain (chest wall).  All other systems reviewed and are negative.   Allergies  Sulfamethoxazole-trimethoprim  Home Medications   Prior to Admission medications   Medication Sig Start Date End Date Taking? Authorizing Provider  aspirin EC 81 MG tablet Take 81 mg by mouth every evening.   Yes Historical Provider, MD  brimonidine (ALPHAGAN P) 0.1 % SOLN Place 1 drop into both eyes every evening.   Yes Historical Provider, MD  cetirizine (ZYRTEC) 10 MG tablet Take 10 mg by mouth. Take 1 tablet daily as needed   Yes Historical Provider, MD  Cholecalciferol (VITAMIN D) 2000 UNITS CAPS Take 2,000 Units by mouth daily.   Yes Historical Provider, MD  hydroxychloroquine (PLAQUENIL) 200 MG tablet Take 200 mg by mouth daily.   Yes Historical Provider, MD  lansoprazole (PREVACID) 30 MG capsule Take 30 mg by mouth daily.   Yes Historical Provider, MD  LORazepam (ATIVAN) 1 MG tablet Take 1 tablet (1 mg total) by mouth 3 (three) times daily as needed for  anxiety. 08/10/78  Yes Delora Fuel, MD  losartan-hydrochlorothiazide (HYZAAR) 50-12.5 MG per tablet Take 1 tablet by mouth every evening.   Yes Historical Provider, MD  meloxicam (MOBIC) 15 MG tablet Take 15 mg by mouth every evening.   Yes Historical Provider, MD  rosuvastatin (CRESTOR) 5 MG tablet Take 5 mg by mouth every evening.   Yes Historical Provider, MD  sertraline (ZOLOFT) 50 MG tablet Take 1 tablet by mouth daily. 07/29/12  Yes Historical Provider, MD  diphenhydrAMINE (BENADRYL) 25 MG tablet Take 25 mg by mouth at bedtime.    Historical Provider, MD  HYDROcodone-acetaminophen (NORCO) 5-325 MG per tablet Take 1 tablet by mouth every 6 (six) hours as needed for moderate pain. 07/05/13   Freeman Caldron Leyani Gargus, PA-C   BP 144/75  Pulse 76   Temp(Src) 98.1 F (36.7 C) (Oral)  Resp 16  SpO2 97% Physical Exam  Nursing note and vitals reviewed. Constitutional: She is oriented to person, place, and time. Vital signs are normal. She appears well-developed and well-nourished. No distress.  HENT:  Head: Normocephalic and atraumatic.  Pulmonary/Chest: Effort normal. No respiratory distress. She exhibits tenderness. She exhibits no deformity.    Neurological: She is alert and oriented to person, place, and time. She has normal strength. Coordination normal.  Skin: Skin is warm and dry. No rash noted. She is not diaphoretic.  Psychiatric: She has a normal mood and affect. Judgment normal.    ED Course  Procedures (including critical care time) Labs Review Labs Reviewed - No data to display  Imaging Review Dg Ribs Unilateral W/chest Left  07/05/2013   CLINICAL DATA:  Left-sided chest pain  EXAM: LEFT RIBS AND CHEST - 3+ VIEW  COMPARISON:  03/17/2013  FINDINGS: Cardiac shadow is within normal limits. The lungs are clear bilaterally. Biapical scarring is seen. No acute rib fracture is noted. No acute bony abnormality is seen.  IMPRESSION: No acute abnormality noted.   Electronically Signed   By: Inez Catalina M.D.   On: 07/05/2013 09:02     MDM   1. Fall   2. Chest wall pain   3. Costochondritis    X-rays are normal. Exam is consistent with costochondritis and chest wall pain. Will prescribe a short course of Norco when necessary, she are he takes meloxicam daily so that should start to help soon. Followup as needed.  Meds ordered this encounter  Medications  . HYDROcodone-acetaminophen (NORCO) 5-325 MG per tablet    Sig: Take 1 tablet by mouth every 6 (six) hours as needed for moderate pain.    Dispense:  15 tablet    Refill:  0    Order Specific Question:  Supervising Provider    Answer:  Jake Michaelis, DAVID C [place], PA-C 07/05/13 [phone]

## 2013-07-06 NOTE — ED Provider Notes (Signed)
Medical screening examination/treatment/procedure(s) were performed by non-physician practitioner and as supervising physician I was immediately available for consultation/collaboration.  Philipp Deputy, M.D.  Harden Mo, MD 07/06/13 2226

## 2013-07-07 DIAGNOSIS — M2550 Pain in unspecified joint: Secondary | ICD-10-CM | POA: Diagnosis not present

## 2013-07-07 DIAGNOSIS — M1990 Unspecified osteoarthritis, unspecified site: Secondary | ICD-10-CM | POA: Diagnosis not present

## 2013-07-07 DIAGNOSIS — Z79899 Other long term (current) drug therapy: Secondary | ICD-10-CM | POA: Diagnosis not present

## 2013-07-07 DIAGNOSIS — M064 Inflammatory polyarthropathy: Secondary | ICD-10-CM | POA: Diagnosis not present

## 2013-07-28 DIAGNOSIS — M069 Rheumatoid arthritis, unspecified: Secondary | ICD-10-CM | POA: Diagnosis not present

## 2013-08-11 DIAGNOSIS — M069 Rheumatoid arthritis, unspecified: Secondary | ICD-10-CM | POA: Diagnosis not present

## 2013-08-25 DIAGNOSIS — M069 Rheumatoid arthritis, unspecified: Secondary | ICD-10-CM | POA: Diagnosis not present

## 2013-08-25 DIAGNOSIS — H60399 Other infective otitis externa, unspecified ear: Secondary | ICD-10-CM | POA: Diagnosis not present

## 2013-08-30 DIAGNOSIS — M25569 Pain in unspecified knee: Secondary | ICD-10-CM | POA: Diagnosis not present

## 2013-08-30 DIAGNOSIS — IMO0002 Reserved for concepts with insufficient information to code with codable children: Secondary | ICD-10-CM | POA: Diagnosis not present

## 2013-08-30 DIAGNOSIS — M2390 Unspecified internal derangement of unspecified knee: Secondary | ICD-10-CM | POA: Diagnosis not present

## 2013-08-30 DIAGNOSIS — M25469 Effusion, unspecified knee: Secondary | ICD-10-CM | POA: Diagnosis not present

## 2013-09-23 DIAGNOSIS — Z5189 Encounter for other specified aftercare: Secondary | ICD-10-CM | POA: Diagnosis not present

## 2013-09-23 DIAGNOSIS — M069 Rheumatoid arthritis, unspecified: Secondary | ICD-10-CM | POA: Diagnosis not present

## 2013-10-06 DIAGNOSIS — M1990 Unspecified osteoarthritis, unspecified site: Secondary | ICD-10-CM | POA: Diagnosis not present

## 2013-10-06 DIAGNOSIS — M26609 Unspecified temporomandibular joint disorder, unspecified side: Secondary | ICD-10-CM | POA: Diagnosis not present

## 2013-10-06 DIAGNOSIS — M2550 Pain in unspecified joint: Secondary | ICD-10-CM | POA: Diagnosis not present

## 2013-10-06 DIAGNOSIS — M064 Inflammatory polyarthropathy: Secondary | ICD-10-CM | POA: Diagnosis not present

## 2013-10-13 DIAGNOSIS — J988 Other specified respiratory disorders: Secondary | ICD-10-CM | POA: Diagnosis not present

## 2013-10-13 DIAGNOSIS — Z87440 Personal history of urinary (tract) infections: Secondary | ICD-10-CM | POA: Diagnosis not present

## 2013-10-13 DIAGNOSIS — R319 Hematuria, unspecified: Secondary | ICD-10-CM | POA: Diagnosis not present

## 2013-10-13 DIAGNOSIS — R3989 Other symptoms and signs involving the genitourinary system: Secondary | ICD-10-CM | POA: Diagnosis not present

## 2013-10-29 DIAGNOSIS — M069 Rheumatoid arthritis, unspecified: Secondary | ICD-10-CM | POA: Diagnosis not present

## 2013-11-02 DIAGNOSIS — H401290 Low-tension glaucoma, unspecified eye, stage unspecified: Secondary | ICD-10-CM | POA: Diagnosis not present

## 2013-11-02 DIAGNOSIS — H2510 Age-related nuclear cataract, unspecified eye: Secondary | ICD-10-CM | POA: Diagnosis not present

## 2013-11-02 DIAGNOSIS — Z961 Presence of intraocular lens: Secondary | ICD-10-CM | POA: Diagnosis not present

## 2013-11-04 DIAGNOSIS — R635 Abnormal weight gain: Secondary | ICD-10-CM | POA: Diagnosis not present

## 2013-11-04 DIAGNOSIS — K219 Gastro-esophageal reflux disease without esophagitis: Secondary | ICD-10-CM | POA: Diagnosis not present

## 2013-11-04 DIAGNOSIS — Z0000 Encounter for general adult medical examination without abnormal findings: Secondary | ICD-10-CM | POA: Diagnosis not present

## 2013-11-04 DIAGNOSIS — K580 Irritable bowel syndrome with diarrhea: Secondary | ICD-10-CM | POA: Diagnosis not present

## 2013-11-04 DIAGNOSIS — E559 Vitamin D deficiency, unspecified: Secondary | ICD-10-CM | POA: Diagnosis not present

## 2013-11-04 DIAGNOSIS — I10 Essential (primary) hypertension: Secondary | ICD-10-CM | POA: Diagnosis not present

## 2013-11-04 DIAGNOSIS — G479 Sleep disorder, unspecified: Secondary | ICD-10-CM | POA: Diagnosis not present

## 2013-11-04 DIAGNOSIS — E780 Pure hypercholesterolemia: Secondary | ICD-10-CM | POA: Diagnosis not present

## 2013-11-04 DIAGNOSIS — N183 Chronic kidney disease, stage 3 (moderate): Secondary | ICD-10-CM | POA: Diagnosis not present

## 2013-11-12 DIAGNOSIS — H401232 Low-tension glaucoma, bilateral, moderate stage: Secondary | ICD-10-CM | POA: Diagnosis not present

## 2013-11-22 ENCOUNTER — Ambulatory Visit: Payer: Medicare Other | Admitting: Internal Medicine

## 2013-11-25 DIAGNOSIS — L570 Actinic keratosis: Secondary | ICD-10-CM | POA: Diagnosis not present

## 2013-11-25 DIAGNOSIS — B078 Other viral warts: Secondary | ICD-10-CM | POA: Diagnosis not present

## 2013-11-25 DIAGNOSIS — L820 Inflamed seborrheic keratosis: Secondary | ICD-10-CM | POA: Diagnosis not present

## 2013-11-26 DIAGNOSIS — M0589 Other rheumatoid arthritis with rheumatoid factor of multiple sites: Secondary | ICD-10-CM | POA: Diagnosis not present

## 2013-11-30 DIAGNOSIS — H401232 Low-tension glaucoma, bilateral, moderate stage: Secondary | ICD-10-CM | POA: Diagnosis not present

## 2013-12-20 DIAGNOSIS — R0609 Other forms of dyspnea: Secondary | ICD-10-CM | POA: Diagnosis not present

## 2013-12-20 DIAGNOSIS — E559 Vitamin D deficiency, unspecified: Secondary | ICD-10-CM | POA: Diagnosis not present

## 2013-12-20 DIAGNOSIS — R0789 Other chest pain: Secondary | ICD-10-CM | POA: Diagnosis not present

## 2013-12-20 DIAGNOSIS — R5383 Other fatigue: Secondary | ICD-10-CM | POA: Diagnosis not present

## 2013-12-20 DIAGNOSIS — F419 Anxiety disorder, unspecified: Secondary | ICD-10-CM | POA: Diagnosis not present

## 2013-12-29 ENCOUNTER — Ambulatory Visit (INDEPENDENT_AMBULATORY_CARE_PROVIDER_SITE_OTHER): Payer: Medicare Other | Admitting: Cardiovascular Disease

## 2013-12-29 ENCOUNTER — Encounter: Payer: Self-pay | Admitting: Cardiovascular Disease

## 2013-12-29 VITALS — BP 118/68 | HR 76 | Ht 66.5 in | Wt 174.9 lb

## 2013-12-29 DIAGNOSIS — R0602 Shortness of breath: Secondary | ICD-10-CM

## 2013-12-29 DIAGNOSIS — I10 Essential (primary) hypertension: Secondary | ICD-10-CM | POA: Diagnosis not present

## 2013-12-29 DIAGNOSIS — R079 Chest pain, unspecified: Secondary | ICD-10-CM

## 2013-12-29 DIAGNOSIS — R0989 Other specified symptoms and signs involving the circulatory and respiratory systems: Secondary | ICD-10-CM

## 2013-12-29 DIAGNOSIS — I2510 Atherosclerotic heart disease of native coronary artery without angina pectoris: Secondary | ICD-10-CM | POA: Diagnosis not present

## 2013-12-29 DIAGNOSIS — G4733 Obstructive sleep apnea (adult) (pediatric): Secondary | ICD-10-CM | POA: Diagnosis not present

## 2013-12-29 NOTE — Assessment & Plan Note (Signed)
Obstructive sleep apnea on C-Pap followed by Dr. Annamaria Boots

## 2013-12-29 NOTE — Progress Notes (Signed)
12/29/2013 Tammy Pineda   Sep 12, 1945  [id number]  Primary Physician Gerrit Heck, MD Primary Cardiologist: Lorretta Harp MD Renae Gloss   HPI:  Mrs. Cass  is a 68 year old mildly overweight married Caucasian female mother of one child, grandmother and 6 grandchildren who was referred by Dr. Leighton Ruff for cardiovascular evaluation. She has previously been a cardiology patient of Dr. Eustace Quail. 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 Her cardiac risk factor profile is remarkable for hypertension and hyperlipidemia as well as strong family history for heart disease. She has never had a heart attack or stroke. She did have a cardiac catheterization after positive stress echo in 2011 by Dr. Olevia Perches revealing minimal CAD with at most 30-40% LAD stenosis. She has noticed progressive dyspnea on exertion and substernal chest pressure when walking up an incline.   Current Outpatient Prescriptions  Medication Sig Dispense Refill  . aspirin EC 81 MG tablet Take 81 mg by mouth every evening.    . Certolizumab Pegol (CIMZIA Alleman) Inject into the skin every 30 (thirty) days.    . cetirizine (ZYRTEC) 10 MG tablet Take 10 mg by mouth. Take 1 tablet daily as needed    . Cholecalciferol (VITAMIN D) 2000 UNITS CAPS Take 2,000 Units by mouth daily.    . CRESTOR 10 MG tablet Take 0.5 tablets by mouth daily.    . diphenhydrAMINE (BENADRYL) 25 MG tablet Take 25 mg by mouth at bedtime.    Marland Kitchen HYDROcodone-acetaminophen (NORCO) 5-325 MG per tablet Take 1 tablet by mouth every 6 (six) hours as needed for moderate pain. 15 tablet 0  . lansoprazole (PREVACID) 30 MG capsule Take 30 mg by mouth daily.    Marland Kitchen LORazepam (ATIVAN) 1 MG tablet Take 1 tablet (1 mg total) by mouth 3 (three) times daily as needed for anxiety. 15 tablet 0  . losartan-hydrochlorothiazide (HYZAAR) 50-12.5 MG per tablet Take 1 tablet by mouth every evening.    . meloxicam (MOBIC)  15 MG tablet Take 15 mg by mouth every evening.    . sertraline (ZOLOFT) 50 MG tablet Take 1 tablet by mouth daily.     No current facility-administered medications for this visit.    Allergies  Allergen Reactions  . Sulfamethoxazole-Trimethoprim Rash    History   Social History  . Marital Status: Married    Spouse Name: N/A    Number of Children: N/A  . Years of Education: N/A   Occupational History  . RN     [employer]   Social History Main Topics  . Smoking status: Never Smoker   . Smokeless tobacco: Not on file  . Alcohol Use: No  . Drug Use: No  . Sexual Activity: Not on file   Other Topics Concern  . 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  Cardiovascular: negative for chest pain, dyspnea on exertion, edema, orthopnea, palpitations, paroxysmal nocturnal dyspnea or shortness of breath Dermatological: negative for rash Respiratory: negative for cough or wheezing Urologic: negative for hematuria Abdominal: negative for nausea, vomiting, diarrhea, bright red blood per rectum, melena, or hematemesis Neurologic: negative for visual changes, syncope, or dizziness All other systems reviewed and are otherwise negative except as noted above.    Blood pressure 118/68, pulse 76, height 5' 6.5" (1.689 m), weight 174  lb 14.4 oz (79.334 kg).  General appearance: alert and no distress Neck: no adenopathy, no carotid bruit, no JVD, supple, symmetrical, trachea midline and thyroid not enlarged, symmetric, no tenderness/mass/nodules Lungs: clear to auscultation bilaterally Heart: regular rate and rhythm, S1, S2 normal, no murmur, click, rub or gallop Extremities: extremities normal, atraumatic, no cyanosis or edema  EKG normal sinus rhythm at 76 with nonspecific ST and T-wave changes. I personally reviewed this EKG  ASSESSMENT AND PLAN:    Essential hypertension History of hypertension with blood pressure measurements at 118/68. She is on losartan hydrochlorothiazide 50/12.5 mg. Continue current medications at current dosing  CAD, NATIVE VESSEL History of CAD status post cardiac catheterization performed by Dr. Eustace Quail in 2011 revealing at most 30-40% proximal LAD stenosis.  Obstructive sleep apnea Obstructive sleep apnea on C-Pap followed by Dr. Fanny Dance History of hyperlipidemia on Crestor 10 mg daily followed by her primary care physician      Lorretta Harp MD [facility] 12/29/2013 3:13 PM

## 2013-12-29 NOTE — Patient Instructions (Signed)
  We will see you back in follow up in 2-3 weeks with Dr Gwenlyn Found.   Dr Gwenlyn Found has ordered: 1. Lexiscan Myoview- this is a test that looks at the blood flow to your heart muscle.  It takes approximately 2 1/2 hours. Please follow instruction sheet, as given.   2.  Echocardiogram. Echocardiography is a painless test that uses sound waves to create images of your heart. It provides your doctor with information about the size and shape of your heart and how well your heart's chambers and valves are working. This procedure takes approximately one hour. There are no restrictions for this procedure.   3. Carotid Duplex- This test is an ultrasound of the carotid arteries in your neck. It looks at blood flow through these arteries that supply the brain with blood. Allow one hour for this exam. There are no restrictions or special instructions.

## 2013-12-29 NOTE — Assessment & Plan Note (Signed)
History of CAD status post cardiac catheterization performed by Dr. Eustace Quail in 2011 revealing at most 30-40% proximal LAD stenosis.

## 2013-12-29 NOTE — Assessment & Plan Note (Signed)
History of hypertension with blood pressure measurements at 118/68. She is on losartan hydrochlorothiazide 50/12.5 mg. Continue current medications at current dosing

## 2013-12-29 NOTE — Assessment & Plan Note (Signed)
History of hyperlipidemia on Crestor 10 mg daily followed by her primary care physician

## 2014-01-03 ENCOUNTER — Ambulatory Visit (HOSPITAL_BASED_OUTPATIENT_CLINIC_OR_DEPARTMENT_OTHER)
Admission: RE | Admit: 2014-01-03 | Discharge: 2014-01-03 | Disposition: A | Discharge status: Home / Self Care | Payer: Medicare Other | Source: Ambulatory Visit | Attending: Cardiovascular Disease | Admitting: Cardiovascular Disease

## 2014-01-03 ENCOUNTER — Ambulatory Visit (HOSPITAL_COMMUNITY)
Admission: RE | Admit: 2014-01-03 | Discharge: 2014-01-03 | Disposition: A | Discharge status: Home / Self Care | Payer: Medicare Other | Source: Ambulatory Visit | Attending: Cardiology | Admitting: Cardiology

## 2014-01-03 DIAGNOSIS — R0989 Other specified symptoms and signs involving the circulatory and respiratory systems: Secondary | ICD-10-CM | POA: Diagnosis not present

## 2014-01-03 DIAGNOSIS — R0600 Dyspnea, unspecified: Secondary | ICD-10-CM | POA: Insufficient documentation

## 2014-01-03 DIAGNOSIS — I10 Essential (primary) hypertension: Secondary | ICD-10-CM | POA: Diagnosis not present

## 2014-01-03 DIAGNOSIS — R0602 Shortness of breath: Secondary | ICD-10-CM | POA: Diagnosis not present

## 2014-01-03 DIAGNOSIS — I519 Heart disease, unspecified: Secondary | ICD-10-CM

## 2014-01-03 DIAGNOSIS — E785 Hyperlipidemia, unspecified: Secondary | ICD-10-CM | POA: Diagnosis not present

## 2014-01-03 NOTE — Progress Notes (Signed)
2D Echo Performed 01/03/2014    Marygrace Drought, RCS

## 2014-01-03 NOTE — Progress Notes (Signed)
Carotid Duplex Completed. Dhruti Ghuman, BS, RDMS, RVT  

## 2014-01-05 ENCOUNTER — Telehealth (HOSPITAL_COMMUNITY): Payer: Self-pay | Admitting: *Deleted

## 2014-01-05 DIAGNOSIS — M2550 Pain in unspecified joint: Secondary | ICD-10-CM | POA: Diagnosis not present

## 2014-01-05 DIAGNOSIS — M2660 Temporomandibular joint disorder, unspecified: Secondary | ICD-10-CM | POA: Diagnosis not present

## 2014-01-05 DIAGNOSIS — M1990 Unspecified osteoarthritis, unspecified site: Secondary | ICD-10-CM | POA: Diagnosis not present

## 2014-01-05 DIAGNOSIS — M064 Inflammatory polyarthropathy: Secondary | ICD-10-CM | POA: Diagnosis not present

## 2014-01-06 ENCOUNTER — Telehealth (HOSPITAL_COMMUNITY): Payer: Self-pay | Admitting: *Deleted

## 2014-01-07 ENCOUNTER — Telehealth (HOSPITAL_COMMUNITY): Payer: Self-pay

## 2014-01-07 NOTE — Telephone Encounter (Signed)
Encounter complete. 

## 2014-01-11 ENCOUNTER — Telehealth (HOSPITAL_COMMUNITY): Payer: Self-pay

## 2014-01-11 DIAGNOSIS — H401232 Low-tension glaucoma, bilateral, moderate stage: Secondary | ICD-10-CM | POA: Diagnosis not present

## 2014-01-11 NOTE — Telephone Encounter (Signed)
Encounter complete. 

## 2014-01-12 ENCOUNTER — Ambulatory Visit (HOSPITAL_COMMUNITY)
Admission: RE | Admit: 2014-01-12 | Discharge: 2014-01-12 | Disposition: A | Discharge status: Home / Self Care | Payer: Medicare Other | Source: Ambulatory Visit | Attending: Cardiovascular Disease | Admitting: Cardiovascular Disease

## 2014-01-12 DIAGNOSIS — R079 Chest pain, unspecified: Secondary | ICD-10-CM | POA: Diagnosis not present

## 2014-01-12 MED ORDER — TECHNETIUM TC 99M SESTAMIBI GENERIC - CARDIOLITE
10.0000 | Freq: Once | INTRAVENOUS | Status: AC | PRN
  Administered 2014-01-12: 10 via INTRAVENOUS

## 2014-01-12 MED ORDER — REGADENOSON 0.4 MG/5ML IV SOLN
0.4000 mg | Freq: Once | INTRAVENOUS | Status: AC
  Administered 2014-01-12: 0.4 mg via INTRAVENOUS

## 2014-01-12 MED ORDER — TECHNETIUM TC 99M SESTAMIBI GENERIC - CARDIOLITE
30.0000 | Freq: Once | INTRAVENOUS | Status: AC | PRN
  Administered 2014-01-12: 30 via INTRAVENOUS

## 2014-01-12 MED ORDER — AMINOPHYLLINE 25 MG/ML IV SOLN
75.0000 mg | Freq: Once | INTRAVENOUS | Status: AC
  Administered 2014-01-12: 75 mg via INTRAVENOUS

## 2014-01-12 NOTE — Procedures (Addendum)
[place] [address] [place] [address] [phone]  Cardiology Nuclear Med Study  Tammy Pineda is a 68 y.o. female     MRN : [id number]     DOB: May 23, 1945  Procedure Date: 01/12/2014  Nuclear Med Background Indication for Stress Test:  Evaluation for Ischemia History:  CAD;No prior NUC MPI for comparison;ECHO on 11/28/2009 Cardiac Risk Factors: Family History - CAD, Hypertension, Lipids and Overweight  Symptoms:  Chest Pain, Dizziness, DOE, Fatigue, Light-Headedness, Palpitations and jaw pain   Nuclear Pre-Procedure Caffeine/Decaff Intake:  8:00pm NPO After: 6:00am   IV Site: R Forearm  IV 0.9% NS with Angio Cath:  22g  Chest Size (in):  n/a IV Started by: Rolene Course, RN  Height: 5' 7"  (1.702 m)  Cup Size: C  BMI:  Body mass index is 27.25 kg/(m^2). Weight:  174 lb (78.926 kg)   Tech Comments:  n/a    Nuclear Med Study 1 or 2 day study: 1 day  Stress Test Type:  [place]  Order Authorizing Provider:  Quay Burow, MD   Resting Radionuclide: Technetium 49mSestamibi  Resting Radionuclide Dose: 9.9 mCi   Stress Radionuclide:  Technetium 934mestamibi  Stress Radionuclide Dose: 29.0 mCi           Stress Protocol Rest HR: 60 Stress HR: 74  Rest BP: 135/66 Stress BP: 138/66  Exercise Time (min): n/a METS: n/a   Predicted Max HR: 150 bpm % Max HR: 62 bpm Rate Pressure Product: 13485  Dose of Adenosine (mg):  n/a Dose of Lexiscan: 0.4 mg  Dose of Atropine (mg): n/a Dose of Dobutamine: n/a mcg/kg/min (at max HR)  Stress Test Technologist: TeLeane ParaCCT Nuclear Technologist: RoOtho PerlCNMT   Rest Procedure:  Myocardial perfusion imaging was performed at rest 45 minutes following the intravenous administration of Technetium 9945mstamibi. Stress Procedure:  The patient received IV Lexiscan 0.4 mg over 15-seconds.  Technetium 69m7mtamibi injected IV at 30-seconds.  Patient experienced SOB,  Headache and Lightheadedness and 75 mg Aminophylline IV was administered. There were no significant changes with Lexiscan.  Quantitative spect images were obtained after a 45 minute delay.  Transient Ischemic Dilatation (Normal <1.22):  1.24 QGS EDV:  79 ml QGS ESV:  28 ml LV Ejection Fraction: 64%     Rest ECG: NSR - Normal EKG  Stress ECG: No significant change from baseline ECG  QPS Raw Data Images:  Normal; no motion artifact; normal heart/lung ratio. Stress Images:  Normal homogeneous uptake in all areas of the myocardium. Rest Images:  Normal homogeneous uptake in all areas of the myocardium. Subtraction (SDS):  No evidence of ischemia. LV Wall Motion:  NL LV Function; NL Wall Motion  Impression Exercise Capacity:  Lexiscan with no exercise. BP Response:  Normal blood pressure response. Clinical Symptoms:  No significant symptoms noted. ECG Impression:  No significant ST segment change suggestive of ischemia. Comparison with Prior Nuclear Study: No previous nuclear study performed   Overall Impression:  Normal stress nuclear study.   CROISanda Klein  01/12/2014 12:23 PM

## 2014-01-18 ENCOUNTER — Ambulatory Visit (INDEPENDENT_AMBULATORY_CARE_PROVIDER_SITE_OTHER): Payer: Medicare Other | Admitting: Internal Medicine

## 2014-01-18 ENCOUNTER — Encounter: Payer: Self-pay | Admitting: Internal Medicine

## 2014-01-18 VITALS — BP 122/68 | HR 78 | Ht 67.0 in | Wt 178.2 lb

## 2014-01-18 DIAGNOSIS — G4733 Obstructive sleep apnea (adult) (pediatric): Secondary | ICD-10-CM

## 2014-01-18 DIAGNOSIS — K2100 Gastro-esophageal reflux disease with esophagitis, without bleeding: Secondary | ICD-10-CM

## 2014-01-18 DIAGNOSIS — I2510 Atherosclerotic heart disease of native coronary artery without angina pectoris: Secondary | ICD-10-CM | POA: Diagnosis not present

## 2014-01-18 NOTE — Progress Notes (Signed)
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 Hx of nasal reconstruction surgery. No family hx of OSA. Good general health. Husband has sleep apnea so he is sensitive to her breathing. Right side is always stuffy so she is a mouth breather. Nasal reconstruction and turbinate reduction in the 1970s. Bedtime 11 PM to midnight, sleep latency maybe 30 minutes, waking 2 or 3 times before up between 5 AM and 8 AM. Benadryl for sleep. History of high blood pressure and allergic rhinitis. Several joint procedures. Denies history of heart disease or thyroid and little respiratory problem except that she gets asthma from tomato plants. Works as Therapist, sports in [place].  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 NPSG 08/11/12- unattended home sleep study-moderate obstructive and central sleep apnea, AHI 29.8 per hour, desaturation to 76% snoring.  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 6 hrs. a night,pr. good,mask good,c/o sore jaw left x 7-8-wks.,nose dryness,no tenderness in sinuses,left ear hurts when jaw hurts CPAP autotitration 5-15 cwp/ Advanced, nasal mask, humidifier.  She says the pressure has felt fine. Mask seems to be okay. Does not recognize bruxism. Having persistent pain at the angle of the left jaw, now limiting mouth opening. No recognized pain. Hearing seems okay. Some nasal dryness. History of osteoarthritis/ Dr Trudie Reed.  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  Pt stopped levaquin after 3rd day due to nausea Still coughing. Pred> too nervous. Took amox, then levaquin, then quit due to nausea. Bronchitis resolved, still dry cough.Lost  10 lbs, poor appetite.Not wearing CPAP- thinks it might bother her face. C/O pain w/ chewing just under L ear.  05/20/13- 33 yo F  never smoker,  RN, followed for OSA, complicated by HBP, CAD      PCP Dr Lucia Gaskins Pt wears cpap AutoPAP 5-15/ Advanced about 6 hours nightly.  Has no problems with mask or supplies.   CPAP seems comfortable and she doesn't recognize sleep problem. Would like CPAP to start a little higher pressure when she first goes to bed. Complains of lack of energy x 3 months- weight up 7 lbs since August. No chest pain. R foot swells a little at times. Easier DOE. Not really sleepy. No acute event. Not known anemic. Feels a little weaker L side. To check this with Dr Drema Dallas.   01/18/14- 57 yo F  never smoker,  RN, followed for OSA, complicated by HBP, CAD      PCP Dr Lucia Gaskins FOLLOWS FOR: Wears CPAP machine; download attached to paper work Still working as an OR Marine scientist at age 38. Download documents excellent compliance and control, using CPAP all night every night with AHI 2.6 per hour. Usual pressure is around 10 but she is on auto set 8-15 and happy with that. Incidental recent bronchitis. Frequent heartburn and easy reflux but does not notice choking at night. Wakes every day nose blowing and cough for a while until she gets cleared.  ROS-see HPI Constitutional:   No-   weight loss, night sweats, fevers, chills, +fatigue, lassitude. HEENT:  No-  headaches, difficulty swallowing, tooth/dental problems, sore throat,  pain L jaw      No-  sneezing, itching, ear ache, +nasal congestion, post nasal drip,  CV:  No-   chest pain, orthopnea, PND, swelling in lower extremities, anasarca, dizziness, palpitations Resp: + shortness of breath with exertion or at rest.             + productive cough,  +non-productive cough,  No- coughing up of blood.              No-   change in color of mucus.  No- wheezing.   Skin: No-   rash or lesions. GI:  No-   +heartburn, +indigestion,  abdominal pain, nausea, vomiting, GU:  MS:  No-   joint pain or swelling. Neuro-     nothing unusual Psych:  No- change in mood or affect. No depression or anxiety.  No memory loss.  OBJ- Physical Exam General- Alert, Oriented, Affect-appropriate, Distress- none acute. Medium build, looks well Skin- rash-none, lesions- none, excoriation- none Lymphadenopathy- none Head- atraumatic            Eyes- Gross vision intact, PERRLA, conjunctivae and secretions clear            Ears- Hearing, canals-normal. + mild retraction L TM            Nose- Clear, + mild external deviation, +more narrow on the right,  No-mucus, polyps,                     erosion, perforation             Throat- Mallampati II , mucosa clear , drainage- none, tonsils- atrophic. Teeth and                                 oropharynx seem ok. Neck- flexible , trachea midline, no stridor , thyroid nl, carotid no bruit Chest - symmetrical excursion , unlabored           Heart/CV- RRR , no murmur , no gallop  , no rub, nl s1 s2                           - JVD- none , edema- none, stasis changes- none, varices- none           Lung- clear, unlabored, wheeze- none, cough-none , dullness-none, rub- none           Chest wall-  Abd-  Br/ Gen/ Rectal- Not done, not indicated Extrem- cyanosis- none, clubbing, none, atrophy- none, strength- nl Neuro- grossly intact to observation

## 2014-01-18 NOTE — Patient Instructions (Signed)
We can continue CPAP 8-15/ Advanced  Suggest you try elevating the head end of your bed frame with a brick under the head legs.  See if this helps the cough

## 2014-01-18 NOTE — Assessment & Plan Note (Addendum)
Excellent compliance and control with AutoPap 8-15 We discussed options but did not see a reason to change her settings.

## 2014-01-18 NOTE — Assessment & Plan Note (Signed)
She describes symptoms indicating inadequate control with heartburn and recognized reflux. I'm concerned that her tendency towards bronchitis may represent episodic mild aspiration. Plan-recommended she revisit her gastroenterologist. Reflux precautions. Try elevating head of bed frame with bricks.

## 2014-01-24 DIAGNOSIS — M0589 Other rheumatoid arthritis with rheumatoid factor of multiple sites: Secondary | ICD-10-CM | POA: Diagnosis not present

## 2014-02-10 DIAGNOSIS — Z79899 Other long term (current) drug therapy: Secondary | ICD-10-CM | POA: Diagnosis not present

## 2014-02-10 DIAGNOSIS — M5430 Sciatica, unspecified side: Secondary | ICD-10-CM | POA: Diagnosis not present

## 2014-02-10 DIAGNOSIS — M064 Inflammatory polyarthropathy: Secondary | ICD-10-CM | POA: Diagnosis not present

## 2014-02-10 DIAGNOSIS — M47816 Spondylosis without myelopathy or radiculopathy, lumbar region: Secondary | ICD-10-CM | POA: Diagnosis not present

## 2014-02-10 DIAGNOSIS — M4316 Spondylolisthesis, lumbar region: Secondary | ICD-10-CM | POA: Diagnosis not present

## 2014-02-10 DIAGNOSIS — M545 Low back pain: Secondary | ICD-10-CM | POA: Diagnosis not present

## 2014-02-24 DIAGNOSIS — M0609 Rheumatoid arthritis without rheumatoid factor, multiple sites: Secondary | ICD-10-CM | POA: Diagnosis not present

## 2014-03-24 DIAGNOSIS — D1801 Hemangioma of skin and subcutaneous tissue: Secondary | ICD-10-CM | POA: Diagnosis not present

## 2014-03-24 DIAGNOSIS — L820 Inflamed seborrheic keratosis: Secondary | ICD-10-CM | POA: Diagnosis not present

## 2014-03-24 DIAGNOSIS — L570 Actinic keratosis: Secondary | ICD-10-CM | POA: Diagnosis not present

## 2014-03-24 DIAGNOSIS — M0609 Rheumatoid arthritis without rheumatoid factor, multiple sites: Secondary | ICD-10-CM | POA: Diagnosis not present

## 2014-04-06 DIAGNOSIS — M545 Low back pain: Secondary | ICD-10-CM | POA: Diagnosis not present

## 2014-04-06 DIAGNOSIS — M2550 Pain in unspecified joint: Secondary | ICD-10-CM | POA: Diagnosis not present

## 2014-04-06 DIAGNOSIS — M2669 Other specified disorders of temporomandibular joint: Secondary | ICD-10-CM | POA: Diagnosis not present

## 2014-04-06 DIAGNOSIS — M064 Inflammatory polyarthropathy: Secondary | ICD-10-CM | POA: Diagnosis not present

## 2014-04-28 DIAGNOSIS — M0609 Rheumatoid arthritis without rheumatoid factor, multiple sites: Secondary | ICD-10-CM | POA: Diagnosis not present

## 2014-05-04 DIAGNOSIS — M9903 Segmental and somatic dysfunction of lumbar region: Secondary | ICD-10-CM | POA: Diagnosis not present

## 2014-05-04 DIAGNOSIS — M9904 Segmental and somatic dysfunction of sacral region: Secondary | ICD-10-CM | POA: Diagnosis not present

## 2014-05-04 DIAGNOSIS — M9902 Segmental and somatic dysfunction of thoracic region: Secondary | ICD-10-CM | POA: Diagnosis not present

## 2014-05-04 DIAGNOSIS — M5136 Other intervertebral disc degeneration, lumbar region: Secondary | ICD-10-CM | POA: Diagnosis not present

## 2014-05-04 DIAGNOSIS — M5414 Radiculopathy, thoracic region: Secondary | ICD-10-CM | POA: Diagnosis not present

## 2014-05-04 DIAGNOSIS — M5417 Radiculopathy, lumbosacral region: Secondary | ICD-10-CM | POA: Diagnosis not present

## 2014-05-05 DIAGNOSIS — M9902 Segmental and somatic dysfunction of thoracic region: Secondary | ICD-10-CM | POA: Diagnosis not present

## 2014-05-05 DIAGNOSIS — M5414 Radiculopathy, thoracic region: Secondary | ICD-10-CM | POA: Diagnosis not present

## 2014-05-05 DIAGNOSIS — M5417 Radiculopathy, lumbosacral region: Secondary | ICD-10-CM | POA: Diagnosis not present

## 2014-05-05 DIAGNOSIS — M5136 Other intervertebral disc degeneration, lumbar region: Secondary | ICD-10-CM | POA: Diagnosis not present

## 2014-05-05 DIAGNOSIS — M9904 Segmental and somatic dysfunction of sacral region: Secondary | ICD-10-CM | POA: Diagnosis not present

## 2014-05-05 DIAGNOSIS — M9903 Segmental and somatic dysfunction of lumbar region: Secondary | ICD-10-CM | POA: Diagnosis not present

## 2014-05-06 DIAGNOSIS — M9904 Segmental and somatic dysfunction of sacral region: Secondary | ICD-10-CM | POA: Diagnosis not present

## 2014-05-06 DIAGNOSIS — M5417 Radiculopathy, lumbosacral region: Secondary | ICD-10-CM | POA: Diagnosis not present

## 2014-05-06 DIAGNOSIS — M5136 Other intervertebral disc degeneration, lumbar region: Secondary | ICD-10-CM | POA: Diagnosis not present

## 2014-05-06 DIAGNOSIS — M9903 Segmental and somatic dysfunction of lumbar region: Secondary | ICD-10-CM | POA: Diagnosis not present

## 2014-05-06 DIAGNOSIS — M5414 Radiculopathy, thoracic region: Secondary | ICD-10-CM | POA: Diagnosis not present

## 2014-05-06 DIAGNOSIS — M9902 Segmental and somatic dysfunction of thoracic region: Secondary | ICD-10-CM | POA: Diagnosis not present

## 2014-05-10 DIAGNOSIS — M9902 Segmental and somatic dysfunction of thoracic region: Secondary | ICD-10-CM | POA: Diagnosis not present

## 2014-05-10 DIAGNOSIS — M5417 Radiculopathy, lumbosacral region: Secondary | ICD-10-CM | POA: Diagnosis not present

## 2014-05-10 DIAGNOSIS — M5414 Radiculopathy, thoracic region: Secondary | ICD-10-CM | POA: Diagnosis not present

## 2014-05-10 DIAGNOSIS — M9903 Segmental and somatic dysfunction of lumbar region: Secondary | ICD-10-CM | POA: Diagnosis not present

## 2014-05-10 DIAGNOSIS — M9904 Segmental and somatic dysfunction of sacral region: Secondary | ICD-10-CM | POA: Diagnosis not present

## 2014-05-10 DIAGNOSIS — M5136 Other intervertebral disc degeneration, lumbar region: Secondary | ICD-10-CM | POA: Diagnosis not present

## 2014-05-11 DIAGNOSIS — M5414 Radiculopathy, thoracic region: Secondary | ICD-10-CM | POA: Diagnosis not present

## 2014-05-11 DIAGNOSIS — M9902 Segmental and somatic dysfunction of thoracic region: Secondary | ICD-10-CM | POA: Diagnosis not present

## 2014-05-11 DIAGNOSIS — M5136 Other intervertebral disc degeneration, lumbar region: Secondary | ICD-10-CM | POA: Diagnosis not present

## 2014-05-11 DIAGNOSIS — M9903 Segmental and somatic dysfunction of lumbar region: Secondary | ICD-10-CM | POA: Diagnosis not present

## 2014-05-11 DIAGNOSIS — M9904 Segmental and somatic dysfunction of sacral region: Secondary | ICD-10-CM | POA: Diagnosis not present

## 2014-05-11 DIAGNOSIS — M5417 Radiculopathy, lumbosacral region: Secondary | ICD-10-CM | POA: Diagnosis not present

## 2014-05-12 DIAGNOSIS — M9904 Segmental and somatic dysfunction of sacral region: Secondary | ICD-10-CM | POA: Diagnosis not present

## 2014-05-12 DIAGNOSIS — M5136 Other intervertebral disc degeneration, lumbar region: Secondary | ICD-10-CM | POA: Diagnosis not present

## 2014-05-12 DIAGNOSIS — M9902 Segmental and somatic dysfunction of thoracic region: Secondary | ICD-10-CM | POA: Diagnosis not present

## 2014-05-12 DIAGNOSIS — M9903 Segmental and somatic dysfunction of lumbar region: Secondary | ICD-10-CM | POA: Diagnosis not present

## 2014-05-12 DIAGNOSIS — M5417 Radiculopathy, lumbosacral region: Secondary | ICD-10-CM | POA: Diagnosis not present

## 2014-05-12 DIAGNOSIS — M5414 Radiculopathy, thoracic region: Secondary | ICD-10-CM | POA: Diagnosis not present

## 2014-05-13 DIAGNOSIS — M0609 Rheumatoid arthritis without rheumatoid factor, multiple sites: Secondary | ICD-10-CM | POA: Diagnosis not present

## 2014-05-16 DIAGNOSIS — M9902 Segmental and somatic dysfunction of thoracic region: Secondary | ICD-10-CM | POA: Diagnosis not present

## 2014-05-16 DIAGNOSIS — M5417 Radiculopathy, lumbosacral region: Secondary | ICD-10-CM | POA: Diagnosis not present

## 2014-05-16 DIAGNOSIS — M5414 Radiculopathy, thoracic region: Secondary | ICD-10-CM | POA: Diagnosis not present

## 2014-05-16 DIAGNOSIS — M9904 Segmental and somatic dysfunction of sacral region: Secondary | ICD-10-CM | POA: Diagnosis not present

## 2014-05-16 DIAGNOSIS — M9903 Segmental and somatic dysfunction of lumbar region: Secondary | ICD-10-CM | POA: Diagnosis not present

## 2014-05-16 DIAGNOSIS — M5136 Other intervertebral disc degeneration, lumbar region: Secondary | ICD-10-CM | POA: Diagnosis not present

## 2014-05-18 DIAGNOSIS — M9902 Segmental and somatic dysfunction of thoracic region: Secondary | ICD-10-CM | POA: Diagnosis not present

## 2014-05-18 DIAGNOSIS — M9904 Segmental and somatic dysfunction of sacral region: Secondary | ICD-10-CM | POA: Diagnosis not present

## 2014-05-18 DIAGNOSIS — M5414 Radiculopathy, thoracic region: Secondary | ICD-10-CM | POA: Diagnosis not present

## 2014-05-18 DIAGNOSIS — M5417 Radiculopathy, lumbosacral region: Secondary | ICD-10-CM | POA: Diagnosis not present

## 2014-05-18 DIAGNOSIS — M9903 Segmental and somatic dysfunction of lumbar region: Secondary | ICD-10-CM | POA: Diagnosis not present

## 2014-05-18 DIAGNOSIS — M5136 Other intervertebral disc degeneration, lumbar region: Secondary | ICD-10-CM | POA: Diagnosis not present

## 2014-05-20 DIAGNOSIS — M5417 Radiculopathy, lumbosacral region: Secondary | ICD-10-CM | POA: Diagnosis not present

## 2014-05-20 DIAGNOSIS — M5414 Radiculopathy, thoracic region: Secondary | ICD-10-CM | POA: Diagnosis not present

## 2014-05-20 DIAGNOSIS — M9903 Segmental and somatic dysfunction of lumbar region: Secondary | ICD-10-CM | POA: Diagnosis not present

## 2014-05-20 DIAGNOSIS — M9902 Segmental and somatic dysfunction of thoracic region: Secondary | ICD-10-CM | POA: Diagnosis not present

## 2014-05-20 DIAGNOSIS — M5136 Other intervertebral disc degeneration, lumbar region: Secondary | ICD-10-CM | POA: Diagnosis not present

## 2014-05-20 DIAGNOSIS — M9904 Segmental and somatic dysfunction of sacral region: Secondary | ICD-10-CM | POA: Diagnosis not present

## 2014-05-23 DIAGNOSIS — M9904 Segmental and somatic dysfunction of sacral region: Secondary | ICD-10-CM | POA: Diagnosis not present

## 2014-05-23 DIAGNOSIS — M9902 Segmental and somatic dysfunction of thoracic region: Secondary | ICD-10-CM | POA: Diagnosis not present

## 2014-05-23 DIAGNOSIS — M5417 Radiculopathy, lumbosacral region: Secondary | ICD-10-CM | POA: Diagnosis not present

## 2014-05-23 DIAGNOSIS — M5414 Radiculopathy, thoracic region: Secondary | ICD-10-CM | POA: Diagnosis not present

## 2014-05-23 DIAGNOSIS — M9903 Segmental and somatic dysfunction of lumbar region: Secondary | ICD-10-CM | POA: Diagnosis not present

## 2014-05-23 DIAGNOSIS — M5136 Other intervertebral disc degeneration, lumbar region: Secondary | ICD-10-CM | POA: Diagnosis not present

## 2014-05-25 DIAGNOSIS — M5136 Other intervertebral disc degeneration, lumbar region: Secondary | ICD-10-CM | POA: Diagnosis not present

## 2014-05-25 DIAGNOSIS — M5417 Radiculopathy, lumbosacral region: Secondary | ICD-10-CM | POA: Diagnosis not present

## 2014-05-25 DIAGNOSIS — M9903 Segmental and somatic dysfunction of lumbar region: Secondary | ICD-10-CM | POA: Diagnosis not present

## 2014-05-25 DIAGNOSIS — M9902 Segmental and somatic dysfunction of thoracic region: Secondary | ICD-10-CM | POA: Diagnosis not present

## 2014-05-25 DIAGNOSIS — M9904 Segmental and somatic dysfunction of sacral region: Secondary | ICD-10-CM | POA: Diagnosis not present

## 2014-05-25 DIAGNOSIS — M5414 Radiculopathy, thoracic region: Secondary | ICD-10-CM | POA: Diagnosis not present

## 2014-05-31 DIAGNOSIS — M9902 Segmental and somatic dysfunction of thoracic region: Secondary | ICD-10-CM | POA: Diagnosis not present

## 2014-05-31 DIAGNOSIS — M5417 Radiculopathy, lumbosacral region: Secondary | ICD-10-CM | POA: Diagnosis not present

## 2014-05-31 DIAGNOSIS — M5136 Other intervertebral disc degeneration, lumbar region: Secondary | ICD-10-CM | POA: Diagnosis not present

## 2014-05-31 DIAGNOSIS — M9904 Segmental and somatic dysfunction of sacral region: Secondary | ICD-10-CM | POA: Diagnosis not present

## 2014-05-31 DIAGNOSIS — M5414 Radiculopathy, thoracic region: Secondary | ICD-10-CM | POA: Diagnosis not present

## 2014-05-31 DIAGNOSIS — M9903 Segmental and somatic dysfunction of lumbar region: Secondary | ICD-10-CM | POA: Diagnosis not present

## 2014-06-02 DIAGNOSIS — M5414 Radiculopathy, thoracic region: Secondary | ICD-10-CM | POA: Diagnosis not present

## 2014-06-02 DIAGNOSIS — M9904 Segmental and somatic dysfunction of sacral region: Secondary | ICD-10-CM | POA: Diagnosis not present

## 2014-06-02 DIAGNOSIS — M9903 Segmental and somatic dysfunction of lumbar region: Secondary | ICD-10-CM | POA: Diagnosis not present

## 2014-06-02 DIAGNOSIS — M5417 Radiculopathy, lumbosacral region: Secondary | ICD-10-CM | POA: Diagnosis not present

## 2014-06-02 DIAGNOSIS — M5136 Other intervertebral disc degeneration, lumbar region: Secondary | ICD-10-CM | POA: Diagnosis not present

## 2014-06-02 DIAGNOSIS — M9902 Segmental and somatic dysfunction of thoracic region: Secondary | ICD-10-CM | POA: Diagnosis not present

## 2014-06-06 DIAGNOSIS — M0609 Rheumatoid arthritis without rheumatoid factor, multiple sites: Secondary | ICD-10-CM | POA: Diagnosis not present

## 2014-06-22 DIAGNOSIS — M2392 Unspecified internal derangement of left knee: Secondary | ICD-10-CM | POA: Diagnosis not present

## 2014-06-22 DIAGNOSIS — M7541 Impingement syndrome of right shoulder: Secondary | ICD-10-CM | POA: Diagnosis not present

## 2014-06-22 DIAGNOSIS — M25562 Pain in left knee: Secondary | ICD-10-CM | POA: Diagnosis not present

## 2014-06-29 DIAGNOSIS — N907 Vulvar cyst: Secondary | ICD-10-CM | POA: Diagnosis not present

## 2014-07-07 DIAGNOSIS — M2550 Pain in unspecified joint: Secondary | ICD-10-CM | POA: Diagnosis not present

## 2014-07-07 DIAGNOSIS — M064 Inflammatory polyarthropathy: Secondary | ICD-10-CM | POA: Diagnosis not present

## 2014-07-07 DIAGNOSIS — Z79899 Other long term (current) drug therapy: Secondary | ICD-10-CM | POA: Diagnosis not present

## 2014-07-07 DIAGNOSIS — M2660 Temporomandibular joint disorder, unspecified: Secondary | ICD-10-CM | POA: Diagnosis not present

## 2014-07-11 DIAGNOSIS — M2392 Unspecified internal derangement of left knee: Secondary | ICD-10-CM | POA: Diagnosis not present

## 2014-07-18 DIAGNOSIS — M2392 Unspecified internal derangement of left knee: Secondary | ICD-10-CM | POA: Diagnosis not present

## 2014-07-20 DIAGNOSIS — M2392 Unspecified internal derangement of left knee: Secondary | ICD-10-CM | POA: Diagnosis not present

## 2014-07-21 DIAGNOSIS — M2392 Unspecified internal derangement of left knee: Secondary | ICD-10-CM | POA: Diagnosis not present

## 2014-07-21 DIAGNOSIS — Z961 Presence of intraocular lens: Secondary | ICD-10-CM | POA: Diagnosis not present

## 2014-07-21 DIAGNOSIS — M25511 Pain in right shoulder: Secondary | ICD-10-CM | POA: Diagnosis not present

## 2014-07-21 DIAGNOSIS — G8929 Other chronic pain: Secondary | ICD-10-CM | POA: Diagnosis not present

## 2014-07-21 DIAGNOSIS — H2512 Age-related nuclear cataract, left eye: Secondary | ICD-10-CM | POA: Diagnosis not present

## 2014-07-21 DIAGNOSIS — M25562 Pain in left knee: Secondary | ICD-10-CM | POA: Diagnosis not present

## 2014-07-21 DIAGNOSIS — H401232 Low-tension glaucoma, bilateral, moderate stage: Secondary | ICD-10-CM | POA: Diagnosis not present

## 2014-07-22 ENCOUNTER — Ambulatory Visit (INDEPENDENT_AMBULATORY_CARE_PROVIDER_SITE_OTHER): Payer: Medicare Other | Admitting: Internal Medicine

## 2014-07-22 ENCOUNTER — Encounter: Payer: Self-pay | Admitting: Internal Medicine

## 2014-07-22 VITALS — BP 116/70 | HR 73 | Ht 67.0 in | Wt 174.0 lb

## 2014-07-22 DIAGNOSIS — K219 Gastro-esophageal reflux disease without esophagitis: Secondary | ICD-10-CM | POA: Diagnosis not present

## 2014-07-22 DIAGNOSIS — G4733 Obstructive sleep apnea (adult) (pediatric): Secondary | ICD-10-CM | POA: Diagnosis not present

## 2014-07-22 NOTE — Progress Notes (Signed)
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 Hx of nasal reconstruction surgery. No family hx of OSA. Good general health. Husband has sleep apnea so he is sensitive to her breathing. Right side is always stuffy so she is a mouth breather. Nasal reconstruction and turbinate reduction in the 1970s. Bedtime 11 PM to midnight, sleep latency maybe 30 minutes, waking 2 or 3 times before up between 5 AM and 8 AM. Benadryl for sleep. History of high blood pressure and allergic rhinitis. Several joint procedures. Denies history of heart disease or thyroid and little respiratory problem except that she gets asthma from tomato plants. Works as Therapist, sports in [place].  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 NPSG 08/11/12- unattended home sleep study-moderate obstructive and central sleep apnea, AHI 29.8 per hour, desaturation to 76% snoring.  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 6 hrs. a night,pr. good,mask good,c/o sore jaw left x 7-8-wks.,nose dryness,no tenderness in sinuses,left ear hurts when jaw hurts CPAP autotitration 5-15 cwp/ Advanced, nasal mask, humidifier.  She says the pressure has felt fine. Mask seems to be okay. Does not recognize bruxism. Having persistent pain at the angle of the left jaw, now limiting mouth opening. No recognized pain. Hearing seems okay. Some nasal dryness. History of osteoarthritis/ Dr Trudie Reed.  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  Pt stopped levaquin after 3rd day due to nausea Still coughing. Pred> too nervous. Took amox, then levaquin, then quit due to nausea. Bronchitis resolved, still dry cough.Lost  10 lbs, poor appetite.Not wearing CPAP- thinks it might bother her face. C/O pain w/ chewing just under L ear.  05/20/13- 17 yo F  never smoker,  RN, followed for OSA, complicated by HBP, CAD      PCP Dr Lucia Gaskins Pt wears cpap AutoPAP 5-15/ Advanced about 6 hours nightly.  Has no problems with mask or supplies.   CPAP seems comfortable and she doesn't recognize sleep problem. Would like CPAP to start a little higher pressure when she first goes to bed. Complains of lack of energy x 3 months- weight up 7 lbs since August. No chest pain. R foot swells a little at times. Easier DOE. Not really sleepy. No acute event. Not known anemic. Feels a little weaker L side. To check this with Dr Drema Dallas.   01/18/14- 35 yo F  never smoker,  RN, followed for OSA, complicated by HBP, CAD      PCP Dr Lucia Gaskins FOLLOWS FOR: Wears CPAP machine; download attached to paper work Still working as an OR Marine scientist at age 67. Download documents excellent compliance and control, using CPAP all night every night with AHI 2.6 per hour. Usual pressure is around 10 but she is on auto set 8-15 and happy with that. Incidental recent bronchitis. Frequent heartburn and easy reflux but does not notice choking at night. Wakes every day nose blowing and cough for a while until she gets cleared.  07/22/14-69 yo F  never smoker,  RN, followed for OSA, complicated by HBP, CAD  PCP Dr Lucia Gaskins FOLLOWS FOR: Wears CPAP auto 8-15/ Advance every night; DL attached form [employer]. Elevating head of bed took care of reflux related cough  ROS-see HPI Constitutional:   No-   weight loss, night sweats, fevers, chills, +fatigue, lassitude. HEENT:   No-  headaches, difficulty swallowing, tooth/dental problems, sore throat,  pain L jaw      No-  sneezing, itching, ear ache, +nasal congestion, post nasal drip,  CV:  No-   chest pain, orthopnea, PND, swelling in lower extremities, anasarca, dizziness, palpitations Resp:  shortness of breath  with exertion or at rest.              productive cough,  non-productive cough,  No- coughing up of blood.              No-   change in color of mucus.  No- wheezing.   Skin: No-   rash or lesions. GI:  No-   heartburn, +indigestion, abdominal pain, nausea, vomiting, GU:  MS:  No-   joint pain or swelling. Neuro-     nothing unusual Psych:  No- change in mood or affect. No depression or anxiety.  No memory loss.  OBJ- Physical Exam General- Alert, Oriented, Affect-appropriate, Distress- none acute. Medium build, looks well Skin- rash-none, lesions- none, excoriation- none Lymphadenopathy- none Head- atraumatic            Eyes- Gross vision intact, PERRLA, conjunctivae and secretions clear            Ears- Hearing, canals-normal. + mild retraction L TM            Nose- Clear, + mild external deviation, +more narrow on the right,  No-mucus, polyps,  erosion, perforation             Throat- Mallampati II , mucosa clear , drainage- none, tonsils- atrophic. Teeth and                                 oropharynx seem ok. Neck- flexible , trachea midline, no stridor , thyroid nl, carotid no bruit Chest - symmetrical excursion , unlabored           Heart/CV- RRR , no murmur , no gallop  , no rub, nl s1 s2                           - JVD- none , edema- none, stasis changes- none, varices- none           Lung- clear, unlabored, wheeze- none, cough-none , dullness-none, rub- none           Chest wall-  Abd-  Br/ Gen/ Rectal- Not done, not indicated Extrem- cyanosis- none, clubbing, none, atrophy- none, strength- nl Neuro- grossly intact to observation

## 2014-07-22 NOTE — Patient Instructions (Signed)
We can continue CPAP auto 8-15/ Advanced  Script will be printed "Evaluate for small portable CPAP machine like Transcend"  You can also price-compare online at CPAP.com  Please call as needed

## 2014-07-24 DIAGNOSIS — K219 Gastro-esophageal reflux disease without esophagitis: Secondary | ICD-10-CM | POA: Insufficient documentation

## 2014-07-24 NOTE — Assessment & Plan Note (Signed)
Good compliance and control now with CPAP. She is happy to leave it on auto Pap. Download confirms excellent compliance and control.

## 2014-07-24 NOTE — Assessment & Plan Note (Signed)
Elevating head of bed as resolved nighttime heartburn and stopped positional cough

## 2014-07-26 DIAGNOSIS — M2392 Unspecified internal derangement of left knee: Secondary | ICD-10-CM | POA: Diagnosis not present

## 2014-07-29 ENCOUNTER — Encounter: Payer: Self-pay | Admitting: Internal Medicine

## 2014-07-29 DIAGNOSIS — M2392 Unspecified internal derangement of left knee: Secondary | ICD-10-CM | POA: Diagnosis not present

## 2014-08-01 ENCOUNTER — Other Ambulatory Visit: Payer: Self-pay

## 2014-08-01 DIAGNOSIS — M064 Inflammatory polyarthropathy: Secondary | ICD-10-CM | POA: Diagnosis not present

## 2014-08-01 DIAGNOSIS — M0609 Rheumatoid arthritis without rheumatoid factor, multiple sites: Secondary | ICD-10-CM | POA: Diagnosis not present

## 2014-08-01 DIAGNOSIS — M25562 Pain in left knee: Secondary | ICD-10-CM | POA: Diagnosis not present

## 2014-08-02 DIAGNOSIS — M2392 Unspecified internal derangement of left knee: Secondary | ICD-10-CM | POA: Diagnosis not present

## 2014-08-05 DIAGNOSIS — M7541 Impingement syndrome of right shoulder: Secondary | ICD-10-CM | POA: Diagnosis not present

## 2014-08-05 DIAGNOSIS — M2392 Unspecified internal derangement of left knee: Secondary | ICD-10-CM | POA: Diagnosis not present

## 2014-08-10 DIAGNOSIS — M1712 Unilateral primary osteoarthritis, left knee: Secondary | ICD-10-CM | POA: Diagnosis not present

## 2014-08-10 DIAGNOSIS — M2392 Unspecified internal derangement of left knee: Secondary | ICD-10-CM | POA: Diagnosis not present

## 2014-08-10 DIAGNOSIS — M25562 Pain in left knee: Secondary | ICD-10-CM | POA: Diagnosis not present

## 2014-08-10 DIAGNOSIS — M25511 Pain in right shoulder: Secondary | ICD-10-CM | POA: Diagnosis not present

## 2014-08-11 DIAGNOSIS — R5383 Other fatigue: Secondary | ICD-10-CM | POA: Diagnosis not present

## 2014-08-11 DIAGNOSIS — M064 Inflammatory polyarthropathy: Secondary | ICD-10-CM | POA: Diagnosis not present

## 2014-08-11 DIAGNOSIS — M2550 Pain in unspecified joint: Secondary | ICD-10-CM | POA: Diagnosis not present

## 2014-08-11 DIAGNOSIS — M2660 Temporomandibular joint disorder, unspecified: Secondary | ICD-10-CM | POA: Diagnosis not present

## 2014-08-16 DIAGNOSIS — M2392 Unspecified internal derangement of left knee: Secondary | ICD-10-CM | POA: Diagnosis not present

## 2014-08-17 DIAGNOSIS — R748 Abnormal levels of other serum enzymes: Secondary | ICD-10-CM | POA: Diagnosis not present

## 2014-08-17 DIAGNOSIS — R7301 Impaired fasting glucose: Secondary | ICD-10-CM | POA: Diagnosis not present

## 2014-08-17 DIAGNOSIS — E780 Pure hypercholesterolemia: Secondary | ICD-10-CM | POA: Diagnosis not present

## 2014-08-18 DIAGNOSIS — M2392 Unspecified internal derangement of left knee: Secondary | ICD-10-CM | POA: Diagnosis not present

## 2014-08-23 ENCOUNTER — Other Ambulatory Visit: Payer: Self-pay | Admitting: Family Medicine

## 2014-08-23 DIAGNOSIS — R7989 Other specified abnormal findings of blood chemistry: Secondary | ICD-10-CM

## 2014-08-23 DIAGNOSIS — M2392 Unspecified internal derangement of left knee: Secondary | ICD-10-CM | POA: Diagnosis not present

## 2014-08-23 DIAGNOSIS — R945 Abnormal results of liver function studies: Principal | ICD-10-CM

## 2014-08-26 DIAGNOSIS — M2392 Unspecified internal derangement of left knee: Secondary | ICD-10-CM | POA: Diagnosis not present

## 2014-08-30 ENCOUNTER — Ambulatory Visit
Admission: RE | Admit: 2014-08-30 | Discharge: 2014-08-30 | Disposition: A | Discharge status: Home / Self Care | Payer: Medicare Other | Source: Ambulatory Visit | Attending: Family Medicine | Admitting: Family Medicine

## 2014-08-30 DIAGNOSIS — N281 Cyst of kidney, acquired: Secondary | ICD-10-CM | POA: Diagnosis not present

## 2014-08-30 DIAGNOSIS — R945 Abnormal results of liver function studies: Secondary | ICD-10-CM | POA: Diagnosis not present

## 2014-08-30 DIAGNOSIS — R7989 Other specified abnormal findings of blood chemistry: Secondary | ICD-10-CM

## 2014-08-31 DIAGNOSIS — M2392 Unspecified internal derangement of left knee: Secondary | ICD-10-CM | POA: Diagnosis not present

## 2014-09-07 DIAGNOSIS — M7541 Impingement syndrome of right shoulder: Secondary | ICD-10-CM | POA: Diagnosis not present

## 2014-09-07 DIAGNOSIS — M2392 Unspecified internal derangement of left knee: Secondary | ICD-10-CM | POA: Diagnosis not present

## 2014-09-07 DIAGNOSIS — M25562 Pain in left knee: Secondary | ICD-10-CM | POA: Diagnosis not present

## 2014-09-07 DIAGNOSIS — M1712 Unilateral primary osteoarthritis, left knee: Secondary | ICD-10-CM | POA: Diagnosis not present

## 2014-09-14 DIAGNOSIS — M1990 Unspecified osteoarthritis, unspecified site: Secondary | ICD-10-CM | POA: Diagnosis not present

## 2014-09-14 DIAGNOSIS — E559 Vitamin D deficiency, unspecified: Secondary | ICD-10-CM | POA: Diagnosis not present

## 2014-09-14 DIAGNOSIS — M150 Primary generalized (osteo)arthritis: Secondary | ICD-10-CM | POA: Diagnosis not present

## 2014-09-14 DIAGNOSIS — E780 Pure hypercholesterolemia: Secondary | ICD-10-CM | POA: Diagnosis not present

## 2014-09-14 DIAGNOSIS — R000 Tachycardia, unspecified: Secondary | ICD-10-CM | POA: Diagnosis not present

## 2014-09-14 DIAGNOSIS — I10 Essential (primary) hypertension: Secondary | ICD-10-CM | POA: Diagnosis not present

## 2014-09-14 DIAGNOSIS — R740 Nonspecific elevation of levels of transaminase and lactic acid dehydrogenase [LDH]: Secondary | ICD-10-CM | POA: Diagnosis not present

## 2014-09-14 DIAGNOSIS — E119 Type 2 diabetes mellitus without complications: Secondary | ICD-10-CM | POA: Diagnosis not present

## 2014-09-14 DIAGNOSIS — M2550 Pain in unspecified joint: Secondary | ICD-10-CM | POA: Diagnosis not present

## 2014-09-14 DIAGNOSIS — F411 Generalized anxiety disorder: Secondary | ICD-10-CM | POA: Diagnosis not present

## 2014-09-14 DIAGNOSIS — R5383 Other fatigue: Secondary | ICD-10-CM | POA: Diagnosis not present

## 2014-09-14 DIAGNOSIS — I2510 Atherosclerotic heart disease of native coronary artery without angina pectoris: Secondary | ICD-10-CM | POA: Diagnosis not present

## 2014-09-14 DIAGNOSIS — N183 Chronic kidney disease, stage 3 (moderate): Secondary | ICD-10-CM | POA: Diagnosis not present

## 2014-09-28 ENCOUNTER — Encounter: Payer: Medicare Other | Attending: Family Medicine | Admitting: Dietician

## 2014-09-28 ENCOUNTER — Encounter: Payer: Self-pay | Admitting: Dietician

## 2014-09-28 VITALS — Ht 66.5 in | Wt 172.5 lb

## 2014-09-28 DIAGNOSIS — Z713 Dietary counseling and surveillance: Secondary | ICD-10-CM | POA: Diagnosis not present

## 2014-09-28 DIAGNOSIS — E119 Type 2 diabetes mellitus without complications: Secondary | ICD-10-CM | POA: Diagnosis not present

## 2014-09-28 NOTE — Patient Instructions (Signed)
Goals: Aim to eat 30-45 g of carbohydrates at meals and 15 g of carbs at snacks. Have protein and carbohydrates at each meal and snack (see list). Aim to fill half of your plate with vegetables at lunch and dinner. Have protein and carbohydrates on a quarter of your plate. Continue getting at least 150 minutes or more of physical activity each week.  Test blood sugar as directed by your doctor. Try to make ranch dressing with non fat plain yogurt with [facility] packet.

## 2014-09-28 NOTE — Progress Notes (Signed)
Diabetes Self-Management Education  Visit Type: First/Initial  Appt. Start Time: 320 Appt. End Time: 430  09/28/2014  [name], identified by name and date of birth, is a 69 y.o. female with a diagnosis of Diabetes: Type 2.   ASSESSMENT  Height 5' 6.5" (1.689 m), weight 172 lb 8 oz (78.245 kg). Body mass index is 27.43 kg/(m^2).      Diabetes Self-Management Education - 09/28/14 1645    Visit Information   Visit Type First/Initial   Initial Visit   Diabetes Type Type 2   Are you currently following a meal plan? No   Are you taking your medications as prescribed? Yes   Date Diagnosed 08/2014   Health Coping   How would you rate your overall health? Good   Psychosocial Assessment   Patient Belief/Attitude about Diabetes Afraid   Self-care barriers None   Self-management support Family   Other persons present Spouse/SO;Patient   Special Needs None   Preferred Learning Style No preference indicated   Learning Readiness Ready   How often do you need to have someone help you when you read instructions, pamphlets, or other written materials from your doctor or pharmacy? 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 nursing school   Complications   Last HgB A1C per patient/outside source 6.7 %  08/2014   How often do you check your blood sugar? 1-2 times/day   Fasting Blood glucose range (mg/dL) 70-129;130-179   Number of hypoglycemic episodes per month 0   Number of hyperglycemic episodes per week 0   Have you had a dilated eye exam in the past 12 months? Yes   Have you had a dental exam in the past 12 months? Yes   Are you checking your feet? No   Dietary Intake   Breakfast oatmeal cookie or egg with cheese and bacon   Snack (morning) none   Lunch sandwich with whole wheat bread, tuna or chicken with fruit or salad on the side   Snack (afternoon) hummus with pepper or crackers or yogurt   Dinner fish or grilled chicken or steak with broccoli and  salad    Snack (evening) none   Beverage(s) unsweet tea or diet coke   Exercise   Exercise Type Moderate (swimming / aerobic walking)   How many days per week to you exercise? 3   How many minutes per day do you exercise? --  60-120 minutes   Patient Education   Previous Diabetes Education No   Disease state  Definition of diabetes, type 1 and 2, and the diagnosis of diabetes   Nutrition management  Role of diet in the treatment of diabetes and the relationship between the three main macronutrients and blood glucose level;Carbohydrate counting;Food label reading, portion sizes and measuring food.   Physical activity and exercise  Role of exercise on diabetes management, blood pressure control and cardiac health.   Acute complications Taught treatment of hypoglycemia - the 15 rule.;Discussed and identified patients' treatment of hyperglycemia.   Chronic complications Dental care;Assessed and discussed foot care and prevention of foot problems;Lipid levels, blood glucose control and heart disease;Relationship between chronic complications and blood glucose control   Psychosocial adjustment Role of stress on diabetes   Individualized Goals (developed by patient)   Nutrition Follow meal plan discussed   Physical Activity Exercise 3-5 times per week   Monitoring  test my blood glucose as discussed   Reducing Risk do foot checks  daily   Outcomes   Expected Outcomes Demonstrated interest in learning. Expect positive outcomes   Future DMSE PRN   Program Status Completed      Individualized Plan for Diabetes Self-Management Training:   Learning Objective:  Patient will have a greater understanding of diabetes self-management. Patient education plan is to attend individual and/or group sessions per assessed needs and concerns.   Plan:   Aim to eat 30-45 g of carbohydrates at meals and 15 g of carbs at snacks. Have protein and carbohydrates at each meal and snack (see list). Aim to fill  half of your plate with vegetables at lunch and dinner. Have protein and carbohydrates on a quarter of your plate. Continue getting at least 150 minutes or more of physical activity each week.  Test blood sugar as directed by your doctor. Try to make ranch dressing with non fat plain yogurt with Hidden [facility] packet.  Expected Outcomes:  Demonstrated interest in learning. Expect positive outcomes  Education material provided: Living Well with Diabetes and Meal plan card  If problems or questions, patient to contact team via:  Phone  Future DSME appointment: PRN

## 2014-10-19 DIAGNOSIS — M25562 Pain in left knee: Secondary | ICD-10-CM | POA: Diagnosis not present

## 2014-10-19 DIAGNOSIS — M1811 Unilateral primary osteoarthritis of first carpometacarpal joint, right hand: Secondary | ICD-10-CM | POA: Diagnosis not present

## 2014-10-19 DIAGNOSIS — M79644 Pain in right finger(s): Secondary | ICD-10-CM | POA: Diagnosis not present

## 2014-10-19 DIAGNOSIS — M25511 Pain in right shoulder: Secondary | ICD-10-CM | POA: Diagnosis not present

## 2014-11-15 DIAGNOSIS — M1712 Unilateral primary osteoarthritis, left knee: Secondary | ICD-10-CM | POA: Diagnosis not present

## 2014-11-15 DIAGNOSIS — M2392 Unspecified internal derangement of left knee: Secondary | ICD-10-CM | POA: Diagnosis not present

## 2014-11-15 DIAGNOSIS — M25562 Pain in left knee: Secondary | ICD-10-CM | POA: Diagnosis not present

## 2014-11-16 DIAGNOSIS — N183 Chronic kidney disease, stage 3 (moderate): Secondary | ICD-10-CM | POA: Diagnosis not present

## 2014-11-16 DIAGNOSIS — E119 Type 2 diabetes mellitus without complications: Secondary | ICD-10-CM | POA: Diagnosis not present

## 2014-11-16 DIAGNOSIS — E7800 Pure hypercholesterolemia, unspecified: Secondary | ICD-10-CM | POA: Diagnosis not present

## 2014-11-16 DIAGNOSIS — R609 Edema, unspecified: Secondary | ICD-10-CM | POA: Diagnosis not present

## 2014-11-16 DIAGNOSIS — I10 Essential (primary) hypertension: Secondary | ICD-10-CM | POA: Diagnosis not present

## 2014-11-16 DIAGNOSIS — E559 Vitamin D deficiency, unspecified: Secondary | ICD-10-CM | POA: Diagnosis not present

## 2014-11-21 DIAGNOSIS — M150 Primary generalized (osteo)arthritis: Secondary | ICD-10-CM | POA: Diagnosis not present

## 2014-11-21 DIAGNOSIS — Z79899 Other long term (current) drug therapy: Secondary | ICD-10-CM | POA: Diagnosis not present

## 2014-11-21 DIAGNOSIS — R5383 Other fatigue: Secondary | ICD-10-CM | POA: Diagnosis not present

## 2014-11-21 DIAGNOSIS — M0609 Rheumatoid arthritis without rheumatoid factor, multiple sites: Secondary | ICD-10-CM | POA: Diagnosis not present

## 2014-11-21 DIAGNOSIS — M2550 Pain in unspecified joint: Secondary | ICD-10-CM | POA: Diagnosis not present

## 2014-11-23 DIAGNOSIS — M2392 Unspecified internal derangement of left knee: Secondary | ICD-10-CM | POA: Diagnosis not present

## 2014-11-23 DIAGNOSIS — M25562 Pain in left knee: Secondary | ICD-10-CM | POA: Diagnosis not present

## 2014-11-23 DIAGNOSIS — M1712 Unilateral primary osteoarthritis, left knee: Secondary | ICD-10-CM | POA: Diagnosis not present

## 2014-11-25 DIAGNOSIS — M0609 Rheumatoid arthritis without rheumatoid factor, multiple sites: Secondary | ICD-10-CM | POA: Diagnosis not present

## 2014-11-28 DIAGNOSIS — M9901 Segmental and somatic dysfunction of cervical region: Secondary | ICD-10-CM | POA: Diagnosis not present

## 2014-11-28 DIAGNOSIS — M9902 Segmental and somatic dysfunction of thoracic region: Secondary | ICD-10-CM | POA: Diagnosis not present

## 2014-11-28 DIAGNOSIS — M5414 Radiculopathy, thoracic region: Secondary | ICD-10-CM | POA: Diagnosis not present

## 2014-11-28 DIAGNOSIS — M5032 Other cervical disc degeneration, mid-cervical region, unspecified level: Secondary | ICD-10-CM | POA: Diagnosis not present

## 2014-11-30 DIAGNOSIS — L820 Inflamed seborrheic keratosis: Secondary | ICD-10-CM | POA: Diagnosis not present

## 2014-11-30 DIAGNOSIS — M1712 Unilateral primary osteoarthritis, left knee: Secondary | ICD-10-CM | POA: Diagnosis not present

## 2014-11-30 DIAGNOSIS — M25562 Pain in left knee: Secondary | ICD-10-CM | POA: Diagnosis not present

## 2014-11-30 DIAGNOSIS — M5032 Other cervical disc degeneration, mid-cervical region, unspecified level: Secondary | ICD-10-CM | POA: Diagnosis not present

## 2014-11-30 DIAGNOSIS — M2392 Unspecified internal derangement of left knee: Secondary | ICD-10-CM | POA: Diagnosis not present

## 2014-11-30 DIAGNOSIS — M9902 Segmental and somatic dysfunction of thoracic region: Secondary | ICD-10-CM | POA: Diagnosis not present

## 2014-11-30 DIAGNOSIS — M9901 Segmental and somatic dysfunction of cervical region: Secondary | ICD-10-CM | POA: Diagnosis not present

## 2014-11-30 DIAGNOSIS — M5414 Radiculopathy, thoracic region: Secondary | ICD-10-CM | POA: Diagnosis not present

## 2014-11-30 DIAGNOSIS — B078 Other viral warts: Secondary | ICD-10-CM | POA: Diagnosis not present

## 2014-12-03 DIAGNOSIS — M9901 Segmental and somatic dysfunction of cervical region: Secondary | ICD-10-CM | POA: Diagnosis not present

## 2014-12-03 DIAGNOSIS — M5032 Other cervical disc degeneration, mid-cervical region, unspecified level: Secondary | ICD-10-CM | POA: Diagnosis not present

## 2014-12-03 DIAGNOSIS — M9902 Segmental and somatic dysfunction of thoracic region: Secondary | ICD-10-CM | POA: Diagnosis not present

## 2014-12-03 DIAGNOSIS — M5414 Radiculopathy, thoracic region: Secondary | ICD-10-CM | POA: Diagnosis not present

## 2014-12-06 ENCOUNTER — Encounter: Payer: Self-pay | Admitting: Podiatry

## 2014-12-06 ENCOUNTER — Ambulatory Visit (INDEPENDENT_AMBULATORY_CARE_PROVIDER_SITE_OTHER): Payer: Medicare Other

## 2014-12-06 ENCOUNTER — Ambulatory Visit (INDEPENDENT_AMBULATORY_CARE_PROVIDER_SITE_OTHER): Payer: Medicare Other | Admitting: Podiatry

## 2014-12-06 VITALS — BP 132/62 | HR 74 | Resp 16 | Ht 66.0 in | Wt 170.0 lb

## 2014-12-06 DIAGNOSIS — E119 Type 2 diabetes mellitus without complications: Secondary | ICD-10-CM | POA: Diagnosis not present

## 2014-12-06 DIAGNOSIS — L600 Ingrowing nail: Secondary | ICD-10-CM

## 2014-12-06 MED ORDER — NEOMYCIN-POLYMYXIN-HC 3.5-10000-1 OT SOLN: OTIC | Status: DC

## 2014-12-06 NOTE — Progress Notes (Signed)
   Subjective:    Patient ID: Tammy Pineda, female    DOB: 06/29/1945, 69 y.o.   MRN: [id number]  HPI: She presents today with a 6 month history of an ingrown toenail to the fibular border of the hallux right. Since she's recently been diagnosed with diabetes. States that her hemoglobin A1c is under a 7. She would like to see about having this nail margin removed because of the chronicity and the pain.    Review of Systems  Constitutional: Positive for fatigue.  HENT: Positive for tinnitus.   Cardiovascular: Positive for leg swelling.  Musculoskeletal: Positive for joint swelling.  Skin: Positive for color change.  Hematological: Bruises/bleeds easily.  All other systems reviewed and are negative.      Objective:   Physical Exam: 69 year old female in no apparent distress vital signs stable alert and oriented 3. Pulses are strongly palpable. Neurologic sensorium is intact. Deep tendon reflexes are intact muscle strength +5 over 5 dorsiflexion plantar flexors and inverters everters on the musculature is intact bilateral. Orthopedic evaluation to a straight solid joints distal to the ankle range of motion without crepitation. Cutaneous evaluation and straight supple well-hydrated cutis no erythema edema saline as drainage or odor. Sharply incurvated nail margin along the vertebral border of the hallux right with mild hallux abductovalgus deformity. No purulence no malodor.        Assessment & Plan:  Non-complicated diabetes mellitus. Ingrown nail fibular border hallux right.  Plan: Discussed etiology pathology conservative versus surgical therapies. At this point I recommended removing the fibular border of the hallux right. She tolerated this procedure well after local anesthesia was administered. She was given both oral and home going instructions for care and soaking of her toe. I will follow-up with her in 1 week. Also provided her with a prescription for Cortisporin Otic to be  applied twice daily after soaking in Betadine and warm water.  Roselind Messier DPM

## 2014-12-06 NOTE — Patient Instructions (Addendum)

## 2014-12-07 DIAGNOSIS — M9902 Segmental and somatic dysfunction of thoracic region: Secondary | ICD-10-CM | POA: Diagnosis not present

## 2014-12-07 DIAGNOSIS — M5032 Other cervical disc degeneration, mid-cervical region, unspecified level: Secondary | ICD-10-CM | POA: Diagnosis not present

## 2014-12-07 DIAGNOSIS — M9901 Segmental and somatic dysfunction of cervical region: Secondary | ICD-10-CM | POA: Diagnosis not present

## 2014-12-07 DIAGNOSIS — M5414 Radiculopathy, thoracic region: Secondary | ICD-10-CM | POA: Diagnosis not present

## 2014-12-10 DIAGNOSIS — M5032 Other cervical disc degeneration, mid-cervical region, unspecified level: Secondary | ICD-10-CM | POA: Diagnosis not present

## 2014-12-10 DIAGNOSIS — M9901 Segmental and somatic dysfunction of cervical region: Secondary | ICD-10-CM | POA: Diagnosis not present

## 2014-12-10 DIAGNOSIS — M9902 Segmental and somatic dysfunction of thoracic region: Secondary | ICD-10-CM | POA: Diagnosis not present

## 2014-12-10 DIAGNOSIS — M5414 Radiculopathy, thoracic region: Secondary | ICD-10-CM | POA: Diagnosis not present

## 2014-12-13 ENCOUNTER — Ambulatory Visit (INDEPENDENT_AMBULATORY_CARE_PROVIDER_SITE_OTHER): Payer: Medicare Other | Admitting: Podiatry

## 2014-12-13 ENCOUNTER — Encounter: Payer: Self-pay | Admitting: Podiatry

## 2014-12-13 DIAGNOSIS — L600 Ingrowing nail: Secondary | ICD-10-CM

## 2014-12-13 NOTE — Patient Instructions (Signed)
,  Epsom Salt Soak Instructions    START THIS 1 WEEK AFTER INITIAL PROCEDURE  Place 1/4 cup of epsom salts in a quart of warm tap water.  Soak your foot or feet in the solution for 20 minutes twice a day until you notice the area has dried and a scab has formed. Continue to apply other medications to the area as directed by the doctor such as polysporin, neosporin or cortisporin drops.  IF YOUR SKIN BECOMES IRRITATED WHILE USING THESE INSTRUCTIONS, IT IS OKAY TO SWITCH TO  WHITE VINEGAR AND WATER. Or you may use antibacterial soap and water to keep the toe clean  Monitor for any signs/symptoms of infection. Call the office immediately if any occur or go directly to the emergency room. Call with any questions/concerns.

## 2014-12-13 NOTE — Progress Notes (Addendum)
She presents today for follow-up 1 week status post matrixectomy fibular border hallux right. She states that she continues to soak in Betadine warm water and the toe feels much better than it did prior to surgery. She denies fever chills nausea vomiting muscle aches and pains.  Objective: Vital signs are stable she is alert and oriented 3. No erythema edema cellulitis drainage or odor. No pain on palpation.  Assessment: Well-healing surgical toe hallux right.  Plan: Discontinue Betadine start with Epsom salts and warm water soaks covered during the day and leave open at bedtime. Continue the use of Cortisporin Otic. She will continue to soak until there is no redness no drainage no pain. I will follow-up with her on an as-needed basis. She will watch her signs and symptoms of infection.

## 2014-12-21 DIAGNOSIS — E7800 Pure hypercholesterolemia, unspecified: Secondary | ICD-10-CM | POA: Diagnosis not present

## 2014-12-21 DIAGNOSIS — Z23 Encounter for immunization: Secondary | ICD-10-CM | POA: Diagnosis not present

## 2014-12-21 DIAGNOSIS — E559 Vitamin D deficiency, unspecified: Secondary | ICD-10-CM | POA: Diagnosis not present

## 2014-12-21 DIAGNOSIS — E119 Type 2 diabetes mellitus without complications: Secondary | ICD-10-CM | POA: Diagnosis not present

## 2014-12-21 DIAGNOSIS — I10 Essential (primary) hypertension: Secondary | ICD-10-CM | POA: Diagnosis not present

## 2014-12-21 DIAGNOSIS — R609 Edema, unspecified: Secondary | ICD-10-CM | POA: Diagnosis not present

## 2014-12-21 DIAGNOSIS — N183 Chronic kidney disease, stage 3 (moderate): Secondary | ICD-10-CM | POA: Diagnosis not present

## 2014-12-22 ENCOUNTER — Telehealth: Payer: Self-pay | Admitting: Cardiovascular Disease

## 2014-12-22 DIAGNOSIS — M5414 Radiculopathy, thoracic region: Secondary | ICD-10-CM | POA: Diagnosis not present

## 2014-12-22 DIAGNOSIS — M5032 Other cervical disc degeneration, mid-cervical region, unspecified level: Secondary | ICD-10-CM | POA: Diagnosis not present

## 2014-12-22 DIAGNOSIS — M9902 Segmental and somatic dysfunction of thoracic region: Secondary | ICD-10-CM | POA: Diagnosis not present

## 2014-12-22 DIAGNOSIS — M9901 Segmental and somatic dysfunction of cervical region: Secondary | ICD-10-CM | POA: Diagnosis not present

## 2014-12-22 NOTE — Telephone Encounter (Signed)
Received records from [place] for appointment on 12/27/14 with Dr Gwenlyn Found.  Records given to [facility] (medical records) for Dr Kennon Holter schedule on 12/27/14.  lp

## 2014-12-26 DIAGNOSIS — M0609 Rheumatoid arthritis without rheumatoid factor, multiple sites: Secondary | ICD-10-CM | POA: Diagnosis not present

## 2014-12-27 ENCOUNTER — Encounter: Payer: Self-pay | Admitting: Cardiovascular Disease

## 2014-12-27 ENCOUNTER — Ambulatory Visit (INDEPENDENT_AMBULATORY_CARE_PROVIDER_SITE_OTHER): Payer: Medicare Other | Admitting: Cardiovascular Disease

## 2014-12-27 VITALS — BP 122/66 | HR 80 | Ht 66.0 in | Wt 176.0 lb

## 2014-12-27 DIAGNOSIS — I2510 Atherosclerotic heart disease of native coronary artery without angina pectoris: Secondary | ICD-10-CM | POA: Diagnosis not present

## 2014-12-27 DIAGNOSIS — I10 Essential (primary) hypertension: Secondary | ICD-10-CM

## 2014-12-27 DIAGNOSIS — M5032 Other cervical disc degeneration, mid-cervical region, unspecified level: Secondary | ICD-10-CM | POA: Diagnosis not present

## 2014-12-27 DIAGNOSIS — M5414 Radiculopathy, thoracic region: Secondary | ICD-10-CM | POA: Diagnosis not present

## 2014-12-27 DIAGNOSIS — M9902 Segmental and somatic dysfunction of thoracic region: Secondary | ICD-10-CM | POA: Diagnosis not present

## 2014-12-27 DIAGNOSIS — M9901 Segmental and somatic dysfunction of cervical region: Secondary | ICD-10-CM | POA: Diagnosis not present

## 2014-12-27 NOTE — Assessment & Plan Note (Signed)
History of hypertension blood pressure measured at 122/66. She is on losartan and hydrochlorothiazide. Continue current meds at current dosing

## 2014-12-27 NOTE — Assessment & Plan Note (Signed)
History of hyperlipidemia on Crestor with recent lipid profile performed by her PCP 08/17/14 revealing total cholesterol 148, LDL of 68 and HDL of 54.

## 2014-12-27 NOTE — Patient Instructions (Signed)
Medication Instructions:  Your physician recommends that you continue on your current medications as directed. Please refer to the Current Medication list given to you today.   Labwork: none  Testing/Procedures: none  Follow-Up: Your physician wants you to follow-up in: 12 months with Dr. Jewel Baize will receive a reminder letter in the mail two months in advance. If you don't receive a letter, please call our office to schedule the follow-up appointment.   Any Other Special Instructions Will Be Listed Below (If Applicable).     If you need a refill on your cardiac medications before your next appointment, please call your pharmacy.

## 2014-12-27 NOTE — Assessment & Plan Note (Signed)
History of noncritical CAD cath performed by Dr. Olevia Perches  in 2011. She had a 30-40% LAD lesion.she did have a Myoview stress test performed 01/13/14 which was normal as well as a 2-D echocardiogram.

## 2014-12-27 NOTE — Progress Notes (Signed)
12/27/2014 Tammy Pineda   Oct 20, 1945  [id number]  Primary Physician Gerrit Heck, MD Primary Cardiologist: Lorretta Harp MD Renae Gloss   HPI:  Tammy Pineda is a 69 year old mildly overweight married Caucasian female mother of one child, grandmother and 6 grandchildren who was referred by Dr. Leighton Ruff for cardiovascular evaluation. I last saw her in the office 12/29/13. She has previously been a cardiology patient of Dr. Eustace Quail. 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 Her cardiac risk factor profile is remarkable for hypertension and hyperlipidemia as well as strong family history for heart disease. She has never had a heart attack or stroke. She did have a cardiac catheterization after positive stress echo in 2011 by Dr. Olevia Perches revealing minimal CAD with at most 30-40% LAD stenosis. She had noticed progressive dyspnea on exertion and substernal chest pressure when walking up an incline last year and a subsequent Myoview stress test performed 01/13/14 as well as 2-D echo were entirely normal. She has had no recurrent symptoms.   Current Outpatient Prescriptions  Medication Sig Dispense Refill  . ALPRAZolam (XANAX) 0.5 MG tablet Take 0.5 mg by mouth at bedtime as needed for anxiety.    Marland Kitchen aspirin EC 81 MG tablet Take 81 mg by mouth every evening.    . cetirizine (ZYRTEC) 10 MG tablet Take 10 mg by mouth. Take 1 tablet daily as needed    . Cholecalciferol (VITAMIN D) 2000 UNITS CAPS Take 2,000 Units by mouth daily.    . CRESTOR 10 MG tablet Take 0.5 tablets by mouth daily.    . diphenhydrAMINE (BENADRYL) 25 MG tablet Take 25 mg by mouth at bedtime.    Marland Kitchen escitalopram (LEXAPRO) 10 MG tablet Take 1 tablet by mouth daily.    . fluticasone (FLONASE) 50 MCG/ACT nasal spray Place into both nostrils as needed.     . lansoprazole (PREVACID) 30 MG capsule Take 30 mg by mouth daily.    Marland Kitchen LORazepam (ATIVAN) 1 MG tablet Take 1  tablet (1 mg total) by mouth 3 (three) times daily as needed for anxiety. 15 tablet 0  . losartan-hydrochlorothiazide (HYZAAR) 50-12.5 MG per tablet Take 1 tablet by mouth every evening.    . meloxicam (MOBIC) 15 MG tablet Take 15 mg by mouth every other day.     . neomycin-polymyxin-hydrocortisone (CORTISPORIN) otic solution Apply one to two drops to toe after soaking twice daily. 10 mL 0   No current facility-administered medications for this visit.    Allergies  Allergen Reactions  . Sulfamethoxazole-Trimethoprim Rash    Social History   Social History  . Marital Status: Married    Spouse Name: N/A  . Number of Children: N/A  . Years of Education: N/A   Occupational History  . RN     [employer]   Social History Main Topics  . Smoking status: Never Smoker   . Smokeless tobacco: Not on file  . Alcohol Use: No  . Drug Use: No  . Sexual Activity: Not on file   Other Topics Concern  . Not on file   Social History Narrative   RN working part-time in the [employer]     Review of Systems: General: negative for chills, fever, night sweats or weight changes.  Cardiovascular: negative for chest pain, dyspnea on exertion, edema, orthopnea, palpitations, paroxysmal nocturnal dyspnea or shortness of breath Dermatological: negative for rash Respiratory: negative for cough or wheezing Urologic: negative for hematuria  Abdominal: negative for nausea, vomiting, diarrhea, bright red blood per rectum, melena, or hematemesis Neurologic: negative for visual changes, syncope, or dizziness All other systems reviewed and are otherwise negative except as noted above.    Blood pressure 122/66, pulse 80, height 5' 6"  (1.676 m), weight 176 lb (79.833 kg).  General appearance: alert and no distress Neck: no adenopathy, no JVD, supple, symmetrical, trachea midline, thyroid not enlarged, symmetric, no tenderness/mass/nodules and soft left carotid bruit Lungs: clear to auscultation  bilaterally Heart: regular rate and rhythm, S1, S2 normal, no murmur, click, rub or gallop Extremities: extremities normal, atraumatic, no cyanosis or edema  EKG sinus rhythm at 80 with nonspecific ST and T-wave changes. I personally reviewed this EKG  ASSESSMENT AND PLAN:   HYPERLIPIDEMIA History of hyperlipidemia on Crestor with recent lipid profile performed by her PCP 08/17/14 revealing total cholesterol 148, LDL of 68 and HDL of 54.  Essential hypertension History of hypertension blood pressure measured at 122/66. She is on losartan and hydrochlorothiazide. Continue current meds at current dosing  CAD, NATIVE VESSEL History of noncritical CAD cath performed by Dr. Olevia Perches  in 2011. She had a 30-40% LAD lesion.she did have a Myoview stress test performed 01/13/14 which was normal as well as a 2-D echocardiogram.      Lorretta Harp MD [facility], [facility] 12/27/2014 3:10 PM

## 2015-01-04 DIAGNOSIS — Z961 Presence of intraocular lens: Secondary | ICD-10-CM | POA: Diagnosis not present

## 2015-01-04 DIAGNOSIS — S0502XA Injury of conjunctiva and corneal abrasion without foreign body, left eye, initial encounter: Secondary | ICD-10-CM | POA: Diagnosis not present

## 2015-01-04 DIAGNOSIS — M9902 Segmental and somatic dysfunction of thoracic region: Secondary | ICD-10-CM | POA: Diagnosis not present

## 2015-01-04 DIAGNOSIS — M5414 Radiculopathy, thoracic region: Secondary | ICD-10-CM | POA: Diagnosis not present

## 2015-01-04 DIAGNOSIS — H2512 Age-related nuclear cataract, left eye: Secondary | ICD-10-CM | POA: Diagnosis not present

## 2015-01-04 DIAGNOSIS — M9901 Segmental and somatic dysfunction of cervical region: Secondary | ICD-10-CM | POA: Diagnosis not present

## 2015-01-04 DIAGNOSIS — M5032 Other cervical disc degeneration, mid-cervical region, unspecified level: Secondary | ICD-10-CM | POA: Diagnosis not present

## 2015-01-04 DIAGNOSIS — H4089 Other specified glaucoma: Secondary | ICD-10-CM | POA: Diagnosis not present

## 2015-01-06 DIAGNOSIS — M9901 Segmental and somatic dysfunction of cervical region: Secondary | ICD-10-CM | POA: Diagnosis not present

## 2015-01-06 DIAGNOSIS — M9902 Segmental and somatic dysfunction of thoracic region: Secondary | ICD-10-CM | POA: Diagnosis not present

## 2015-01-06 DIAGNOSIS — M5414 Radiculopathy, thoracic region: Secondary | ICD-10-CM | POA: Diagnosis not present

## 2015-01-06 DIAGNOSIS — M5032 Other cervical disc degeneration, mid-cervical region, unspecified level: Secondary | ICD-10-CM | POA: Diagnosis not present

## 2015-01-09 DIAGNOSIS — Z961 Presence of intraocular lens: Secondary | ICD-10-CM | POA: Diagnosis not present

## 2015-01-09 DIAGNOSIS — H2512 Age-related nuclear cataract, left eye: Secondary | ICD-10-CM | POA: Diagnosis not present

## 2015-01-09 DIAGNOSIS — S0502XD Injury of conjunctiva and corneal abrasion without foreign body, left eye, subsequent encounter: Secondary | ICD-10-CM | POA: Diagnosis not present

## 2015-01-09 DIAGNOSIS — M5032 Other cervical disc degeneration, mid-cervical region, unspecified level: Secondary | ICD-10-CM | POA: Diagnosis not present

## 2015-01-09 DIAGNOSIS — M9902 Segmental and somatic dysfunction of thoracic region: Secondary | ICD-10-CM | POA: Diagnosis not present

## 2015-01-09 DIAGNOSIS — M5414 Radiculopathy, thoracic region: Secondary | ICD-10-CM | POA: Diagnosis not present

## 2015-01-09 DIAGNOSIS — M9901 Segmental and somatic dysfunction of cervical region: Secondary | ICD-10-CM | POA: Diagnosis not present

## 2015-01-11 DIAGNOSIS — M25562 Pain in left knee: Secondary | ICD-10-CM | POA: Diagnosis not present

## 2015-01-11 DIAGNOSIS — M1712 Unilateral primary osteoarthritis, left knee: Secondary | ICD-10-CM | POA: Diagnosis not present

## 2015-01-11 DIAGNOSIS — M2392 Unspecified internal derangement of left knee: Secondary | ICD-10-CM | POA: Diagnosis not present

## 2015-01-20 DIAGNOSIS — Z961 Presence of intraocular lens: Secondary | ICD-10-CM | POA: Diagnosis not present

## 2015-01-20 DIAGNOSIS — H401232 Low-tension glaucoma, bilateral, moderate stage: Secondary | ICD-10-CM | POA: Diagnosis not present

## 2015-01-20 DIAGNOSIS — H2512 Age-related nuclear cataract, left eye: Secondary | ICD-10-CM | POA: Diagnosis not present

## 2015-02-08 DIAGNOSIS — J329 Chronic sinusitis, unspecified: Secondary | ICD-10-CM | POA: Diagnosis not present

## 2015-02-20 DIAGNOSIS — M0609 Rheumatoid arthritis without rheumatoid factor, multiple sites: Secondary | ICD-10-CM | POA: Diagnosis not present

## 2015-02-21 DIAGNOSIS — M150 Primary generalized (osteo)arthritis: Secondary | ICD-10-CM | POA: Diagnosis not present

## 2015-02-21 DIAGNOSIS — M0609 Rheumatoid arthritis without rheumatoid factor, multiple sites: Secondary | ICD-10-CM | POA: Diagnosis not present

## 2015-02-21 DIAGNOSIS — Z79899 Other long term (current) drug therapy: Secondary | ICD-10-CM | POA: Diagnosis not present

## 2015-02-21 DIAGNOSIS — K760 Fatty (change of) liver, not elsewhere classified: Secondary | ICD-10-CM | POA: Diagnosis not present

## 2015-02-21 DIAGNOSIS — M2550 Pain in unspecified joint: Secondary | ICD-10-CM | POA: Diagnosis not present

## 2015-03-06 DIAGNOSIS — H2512 Age-related nuclear cataract, left eye: Secondary | ICD-10-CM | POA: Diagnosis not present

## 2015-03-29 DIAGNOSIS — R399 Unspecified symptoms and signs involving the genitourinary system: Secondary | ICD-10-CM | POA: Diagnosis not present

## 2015-03-29 DIAGNOSIS — K469 Unspecified abdominal hernia without obstruction or gangrene: Secondary | ICD-10-CM | POA: Diagnosis not present

## 2015-03-29 DIAGNOSIS — N9419 Other specified dyspareunia: Secondary | ICD-10-CM | POA: Diagnosis not present

## 2015-03-29 DIAGNOSIS — N3000 Acute cystitis without hematuria: Secondary | ICD-10-CM | POA: Diagnosis not present

## 2015-04-11 DIAGNOSIS — E119 Type 2 diabetes mellitus without complications: Secondary | ICD-10-CM | POA: Diagnosis not present

## 2015-04-11 DIAGNOSIS — E7800 Pure hypercholesterolemia, unspecified: Secondary | ICD-10-CM | POA: Diagnosis not present

## 2015-04-11 DIAGNOSIS — R300 Dysuria: Secondary | ICD-10-CM | POA: Diagnosis not present

## 2015-04-11 DIAGNOSIS — R197 Diarrhea, unspecified: Secondary | ICD-10-CM | POA: Diagnosis not present

## 2015-04-11 DIAGNOSIS — Z13220 Encounter for screening for lipoid disorders: Secondary | ICD-10-CM | POA: Diagnosis not present

## 2015-04-12 DIAGNOSIS — R197 Diarrhea, unspecified: Secondary | ICD-10-CM | POA: Diagnosis not present

## 2015-04-24 DIAGNOSIS — N390 Urinary tract infection, site not specified: Secondary | ICD-10-CM | POA: Diagnosis not present

## 2015-04-24 DIAGNOSIS — N3946 Mixed incontinence: Secondary | ICD-10-CM | POA: Diagnosis not present

## 2015-04-24 DIAGNOSIS — N8110 Cystocele, unspecified: Secondary | ICD-10-CM | POA: Diagnosis not present

## 2015-04-24 DIAGNOSIS — K469 Unspecified abdominal hernia without obstruction or gangrene: Secondary | ICD-10-CM | POA: Diagnosis not present

## 2015-04-24 DIAGNOSIS — Z0000 Encounter for general adult medical examination without abnormal findings: Secondary | ICD-10-CM | POA: Diagnosis not present

## 2015-04-25 DIAGNOSIS — M7541 Impingement syndrome of right shoulder: Secondary | ICD-10-CM | POA: Diagnosis not present

## 2015-04-25 DIAGNOSIS — M2392 Unspecified internal derangement of left knee: Secondary | ICD-10-CM | POA: Diagnosis not present

## 2015-04-25 DIAGNOSIS — M7501 Adhesive capsulitis of right shoulder: Secondary | ICD-10-CM | POA: Diagnosis not present

## 2015-04-25 DIAGNOSIS — M25511 Pain in right shoulder: Secondary | ICD-10-CM | POA: Diagnosis not present

## 2015-05-03 DIAGNOSIS — M0609 Rheumatoid arthritis without rheumatoid factor, multiple sites: Secondary | ICD-10-CM | POA: Diagnosis not present

## 2015-05-05 DIAGNOSIS — M531 Cervicobrachial syndrome: Secondary | ICD-10-CM | POA: Diagnosis not present

## 2015-05-05 DIAGNOSIS — M9901 Segmental and somatic dysfunction of cervical region: Secondary | ICD-10-CM | POA: Diagnosis not present

## 2015-05-05 DIAGNOSIS — M5032 Other cervical disc degeneration, mid-cervical region, unspecified level: Secondary | ICD-10-CM | POA: Diagnosis not present

## 2015-05-05 DIAGNOSIS — M9902 Segmental and somatic dysfunction of thoracic region: Secondary | ICD-10-CM | POA: Diagnosis not present

## 2015-05-10 DIAGNOSIS — M9902 Segmental and somatic dysfunction of thoracic region: Secondary | ICD-10-CM | POA: Diagnosis not present

## 2015-05-10 DIAGNOSIS — M9901 Segmental and somatic dysfunction of cervical region: Secondary | ICD-10-CM | POA: Diagnosis not present

## 2015-05-10 DIAGNOSIS — M5032 Other cervical disc degeneration, mid-cervical region, unspecified level: Secondary | ICD-10-CM | POA: Diagnosis not present

## 2015-05-10 DIAGNOSIS — M531 Cervicobrachial syndrome: Secondary | ICD-10-CM | POA: Diagnosis not present

## 2015-05-16 DIAGNOSIS — M7521 Bicipital tendinitis, right shoulder: Secondary | ICD-10-CM | POA: Diagnosis not present

## 2015-05-16 DIAGNOSIS — M7541 Impingement syndrome of right shoulder: Secondary | ICD-10-CM | POA: Diagnosis not present

## 2015-05-16 DIAGNOSIS — M7501 Adhesive capsulitis of right shoulder: Secondary | ICD-10-CM | POA: Diagnosis not present

## 2015-05-16 DIAGNOSIS — M25511 Pain in right shoulder: Secondary | ICD-10-CM | POA: Diagnosis not present

## 2015-05-22 DIAGNOSIS — Z79899 Other long term (current) drug therapy: Secondary | ICD-10-CM | POA: Diagnosis not present

## 2015-05-22 DIAGNOSIS — M150 Primary generalized (osteo)arthritis: Secondary | ICD-10-CM | POA: Diagnosis not present

## 2015-05-22 DIAGNOSIS — M0609 Rheumatoid arthritis without rheumatoid factor, multiple sites: Secondary | ICD-10-CM | POA: Diagnosis not present

## 2015-05-22 DIAGNOSIS — M2550 Pain in unspecified joint: Secondary | ICD-10-CM | POA: Diagnosis not present

## 2015-05-25 DIAGNOSIS — M7521 Bicipital tendinitis, right shoulder: Secondary | ICD-10-CM | POA: Diagnosis not present

## 2015-05-29 DIAGNOSIS — R1311 Dysphagia, oral phase: Secondary | ICD-10-CM | POA: Diagnosis not present

## 2015-05-29 DIAGNOSIS — K219 Gastro-esophageal reflux disease without esophagitis: Secondary | ICD-10-CM | POA: Diagnosis not present

## 2015-05-29 DIAGNOSIS — M7521 Bicipital tendinitis, right shoulder: Secondary | ICD-10-CM | POA: Diagnosis not present

## 2015-05-31 DIAGNOSIS — M7521 Bicipital tendinitis, right shoulder: Secondary | ICD-10-CM | POA: Diagnosis not present

## 2015-06-02 DIAGNOSIS — D1809 Hemangioma of other sites: Secondary | ICD-10-CM | POA: Diagnosis not present

## 2015-06-02 DIAGNOSIS — L918 Other hypertrophic disorders of the skin: Secondary | ICD-10-CM | POA: Diagnosis not present

## 2015-06-02 DIAGNOSIS — D485 Neoplasm of uncertain behavior of skin: Secondary | ICD-10-CM | POA: Diagnosis not present

## 2015-06-02 DIAGNOSIS — D1801 Hemangioma of skin and subcutaneous tissue: Secondary | ICD-10-CM | POA: Diagnosis not present

## 2015-06-02 DIAGNOSIS — B079 Viral wart, unspecified: Secondary | ICD-10-CM | POA: Diagnosis not present

## 2015-06-02 DIAGNOSIS — L821 Other seborrheic keratosis: Secondary | ICD-10-CM | POA: Diagnosis not present

## 2015-06-02 DIAGNOSIS — B078 Other viral warts: Secondary | ICD-10-CM | POA: Diagnosis not present

## 2015-06-02 DIAGNOSIS — D2271 Melanocytic nevi of right lower limb, including hip: Secondary | ICD-10-CM | POA: Diagnosis not present

## 2015-06-02 DIAGNOSIS — L814 Other melanin hyperpigmentation: Secondary | ICD-10-CM | POA: Diagnosis not present

## 2015-06-05 DIAGNOSIS — M7521 Bicipital tendinitis, right shoulder: Secondary | ICD-10-CM | POA: Diagnosis not present

## 2015-06-08 DIAGNOSIS — M7521 Bicipital tendinitis, right shoulder: Secondary | ICD-10-CM | POA: Diagnosis not present

## 2015-06-09 DIAGNOSIS — K2950 Unspecified chronic gastritis without bleeding: Secondary | ICD-10-CM | POA: Diagnosis not present

## 2015-06-09 DIAGNOSIS — R1310 Dysphagia, unspecified: Secondary | ICD-10-CM | POA: Diagnosis not present

## 2015-06-09 DIAGNOSIS — K222 Esophageal obstruction: Secondary | ICD-10-CM | POA: Diagnosis not present

## 2015-06-09 DIAGNOSIS — K449 Diaphragmatic hernia without obstruction or gangrene: Secondary | ICD-10-CM | POA: Diagnosis not present

## 2015-06-12 DIAGNOSIS — M7521 Bicipital tendinitis, right shoulder: Secondary | ICD-10-CM | POA: Diagnosis not present

## 2015-06-14 DIAGNOSIS — G8929 Other chronic pain: Secondary | ICD-10-CM | POA: Diagnosis not present

## 2015-06-14 DIAGNOSIS — M7521 Bicipital tendinitis, right shoulder: Secondary | ICD-10-CM | POA: Diagnosis not present

## 2015-06-14 DIAGNOSIS — M25511 Pain in right shoulder: Secondary | ICD-10-CM | POA: Diagnosis not present

## 2015-06-14 DIAGNOSIS — M67813 Other specified disorders of tendon, right shoulder: Secondary | ICD-10-CM | POA: Diagnosis not present

## 2015-06-27 DIAGNOSIS — M0609 Rheumatoid arthritis without rheumatoid factor, multiple sites: Secondary | ICD-10-CM | POA: Diagnosis not present

## 2015-07-11 DIAGNOSIS — Z79899 Other long term (current) drug therapy: Secondary | ICD-10-CM | POA: Diagnosis not present

## 2015-07-11 DIAGNOSIS — M0609 Rheumatoid arthritis without rheumatoid factor, multiple sites: Secondary | ICD-10-CM | POA: Diagnosis not present

## 2015-07-12 DIAGNOSIS — M7501 Adhesive capsulitis of right shoulder: Secondary | ICD-10-CM | POA: Diagnosis not present

## 2015-07-12 DIAGNOSIS — R339 Retention of urine, unspecified: Secondary | ICD-10-CM | POA: Diagnosis not present

## 2015-07-12 DIAGNOSIS — M25511 Pain in right shoulder: Secondary | ICD-10-CM | POA: Diagnosis not present

## 2015-07-12 DIAGNOSIS — N760 Acute vaginitis: Secondary | ICD-10-CM | POA: Diagnosis not present

## 2015-07-12 DIAGNOSIS — G8929 Other chronic pain: Secondary | ICD-10-CM | POA: Diagnosis not present

## 2015-07-12 DIAGNOSIS — L292 Pruritus vulvae: Secondary | ICD-10-CM | POA: Diagnosis not present

## 2015-07-12 DIAGNOSIS — M7541 Impingement syndrome of right shoulder: Secondary | ICD-10-CM | POA: Diagnosis not present

## 2015-07-13 ENCOUNTER — Encounter: Payer: Self-pay | Admitting: Podiatry

## 2015-07-13 ENCOUNTER — Ambulatory Visit (INDEPENDENT_AMBULATORY_CARE_PROVIDER_SITE_OTHER): Payer: Medicare Other | Admitting: Podiatry

## 2015-07-13 VITALS — BP 122/70 | HR 77 | Resp 12

## 2015-07-13 DIAGNOSIS — L600 Ingrowing nail: Secondary | ICD-10-CM | POA: Diagnosis not present

## 2015-07-13 DIAGNOSIS — M722 Plantar fascial fibromatosis: Secondary | ICD-10-CM

## 2015-07-13 NOTE — Progress Notes (Signed)
She presents today chief concern of ingrown toenails. She states that she would like to have something done to these but not until the fall. She is complaining of pain to the right heel.  Objective: Vital signs are stable to alert and oriented 3. Pulses are palpable. Neurologic sensorium is intact. Sharp rated now margins to the tibia-fibula border of the hallux bilateral present. She is pain on palpation medial calcaneal tubercle of the right heel consistent with plantar fasciitis.  Assessment: Plantar fasciitis right heel.  Plan: Injected the right heel today with Kenalog and local anesthetic she will follow up with me in a few weeks for surgical intervention regarding matrixectomy is tibia-fibula border of the hallux bilaterally.

## 2015-07-20 DIAGNOSIS — Z961 Presence of intraocular lens: Secondary | ICD-10-CM | POA: Diagnosis not present

## 2015-07-20 DIAGNOSIS — H26493 Other secondary cataract, bilateral: Secondary | ICD-10-CM | POA: Diagnosis not present

## 2015-07-20 DIAGNOSIS — H401232 Low-tension glaucoma, bilateral, moderate stage: Secondary | ICD-10-CM | POA: Diagnosis not present

## 2015-07-24 ENCOUNTER — Ambulatory Visit: Payer: Medicare Other | Admitting: Internal Medicine

## 2015-07-26 DIAGNOSIS — N390 Urinary tract infection, site not specified: Secondary | ICD-10-CM | POA: Diagnosis not present

## 2015-07-26 DIAGNOSIS — R740 Nonspecific elevation of levels of transaminase and lactic acid dehydrogenase [LDH]: Secondary | ICD-10-CM | POA: Diagnosis not present

## 2015-07-26 DIAGNOSIS — R7989 Other specified abnormal findings of blood chemistry: Secondary | ICD-10-CM | POA: Diagnosis not present

## 2015-07-26 DIAGNOSIS — E119 Type 2 diabetes mellitus without complications: Secondary | ICD-10-CM | POA: Diagnosis not present

## 2015-07-27 DIAGNOSIS — I10 Essential (primary) hypertension: Secondary | ICD-10-CM | POA: Diagnosis not present

## 2015-07-27 DIAGNOSIS — M1990 Unspecified osteoarthritis, unspecified site: Secondary | ICD-10-CM | POA: Diagnosis not present

## 2015-07-27 DIAGNOSIS — R002 Palpitations: Secondary | ICD-10-CM | POA: Diagnosis not present

## 2015-07-27 DIAGNOSIS — E559 Vitamin D deficiency, unspecified: Secondary | ICD-10-CM | POA: Diagnosis not present

## 2015-07-27 DIAGNOSIS — I2510 Atherosclerotic heart disease of native coronary artery without angina pectoris: Secondary | ICD-10-CM | POA: Diagnosis not present

## 2015-07-27 DIAGNOSIS — R945 Abnormal results of liver function studies: Secondary | ICD-10-CM | POA: Diagnosis not present

## 2015-07-27 DIAGNOSIS — E119 Type 2 diabetes mellitus without complications: Secondary | ICD-10-CM | POA: Diagnosis not present

## 2015-07-27 DIAGNOSIS — F419 Anxiety disorder, unspecified: Secondary | ICD-10-CM | POA: Diagnosis not present

## 2015-08-09 DIAGNOSIS — K219 Gastro-esophageal reflux disease without esophagitis: Secondary | ICD-10-CM | POA: Diagnosis not present

## 2015-08-09 DIAGNOSIS — R1311 Dysphagia, oral phase: Secondary | ICD-10-CM | POA: Diagnosis not present

## 2015-08-15 DIAGNOSIS — H6980 Other specified disorders of Eustachian tube, unspecified ear: Secondary | ICD-10-CM | POA: Diagnosis not present

## 2015-08-15 DIAGNOSIS — L03116 Cellulitis of left lower limb: Secondary | ICD-10-CM | POA: Diagnosis not present

## 2015-08-17 DIAGNOSIS — Z23 Encounter for immunization: Secondary | ICD-10-CM | POA: Diagnosis not present

## 2015-08-17 DIAGNOSIS — S81819A Laceration without foreign body, unspecified lower leg, initial encounter: Secondary | ICD-10-CM | POA: Diagnosis not present

## 2015-08-18 DIAGNOSIS — L03116 Cellulitis of left lower limb: Secondary | ICD-10-CM | POA: Diagnosis not present

## 2015-08-24 DIAGNOSIS — M150 Primary generalized (osteo)arthritis: Secondary | ICD-10-CM | POA: Diagnosis not present

## 2015-08-24 DIAGNOSIS — L089 Local infection of the skin and subcutaneous tissue, unspecified: Secondary | ICD-10-CM | POA: Diagnosis not present

## 2015-08-24 DIAGNOSIS — M0609 Rheumatoid arthritis without rheumatoid factor, multiple sites: Secondary | ICD-10-CM | POA: Diagnosis not present

## 2015-08-24 DIAGNOSIS — Z79899 Other long term (current) drug therapy: Secondary | ICD-10-CM | POA: Diagnosis not present

## 2015-08-24 DIAGNOSIS — M2550 Pain in unspecified joint: Secondary | ICD-10-CM | POA: Diagnosis not present

## 2015-09-01 DIAGNOSIS — M0609 Rheumatoid arthritis without rheumatoid factor, multiple sites: Secondary | ICD-10-CM | POA: Diagnosis not present

## 2015-09-05 DIAGNOSIS — R300 Dysuria: Secondary | ICD-10-CM | POA: Diagnosis not present

## 2015-09-21 DIAGNOSIS — M7501 Adhesive capsulitis of right shoulder: Secondary | ICD-10-CM | POA: Diagnosis not present

## 2015-09-21 DIAGNOSIS — M7541 Impingement syndrome of right shoulder: Secondary | ICD-10-CM | POA: Diagnosis not present

## 2015-09-21 DIAGNOSIS — M67813 Other specified disorders of tendon, right shoulder: Secondary | ICD-10-CM | POA: Diagnosis not present

## 2015-09-21 DIAGNOSIS — M25511 Pain in right shoulder: Secondary | ICD-10-CM | POA: Diagnosis not present

## 2015-09-29 DIAGNOSIS — M25511 Pain in right shoulder: Secondary | ICD-10-CM | POA: Diagnosis not present

## 2015-10-04 DIAGNOSIS — M5032 Other cervical disc degeneration, mid-cervical region, unspecified level: Secondary | ICD-10-CM | POA: Diagnosis not present

## 2015-10-04 DIAGNOSIS — M531 Cervicobrachial syndrome: Secondary | ICD-10-CM | POA: Diagnosis not present

## 2015-10-04 DIAGNOSIS — M9902 Segmental and somatic dysfunction of thoracic region: Secondary | ICD-10-CM | POA: Diagnosis not present

## 2015-10-04 DIAGNOSIS — M9901 Segmental and somatic dysfunction of cervical region: Secondary | ICD-10-CM | POA: Diagnosis not present

## 2015-10-05 DIAGNOSIS — M25511 Pain in right shoulder: Secondary | ICD-10-CM | POA: Diagnosis not present

## 2015-10-05 DIAGNOSIS — M75111 Incomplete rotator cuff tear or rupture of right shoulder, not specified as traumatic: Secondary | ICD-10-CM | POA: Diagnosis not present

## 2015-10-05 DIAGNOSIS — M7501 Adhesive capsulitis of right shoulder: Secondary | ICD-10-CM | POA: Diagnosis not present

## 2015-10-05 DIAGNOSIS — M7521 Bicipital tendinitis, right shoulder: Secondary | ICD-10-CM | POA: Diagnosis not present

## 2015-10-06 DIAGNOSIS — M9902 Segmental and somatic dysfunction of thoracic region: Secondary | ICD-10-CM | POA: Diagnosis not present

## 2015-10-06 DIAGNOSIS — M531 Cervicobrachial syndrome: Secondary | ICD-10-CM | POA: Diagnosis not present

## 2015-10-06 DIAGNOSIS — M9901 Segmental and somatic dysfunction of cervical region: Secondary | ICD-10-CM | POA: Diagnosis not present

## 2015-10-06 DIAGNOSIS — M5032 Other cervical disc degeneration, mid-cervical region, unspecified level: Secondary | ICD-10-CM | POA: Diagnosis not present

## 2015-10-11 DIAGNOSIS — H10413 Chronic giant papillary conjunctivitis, bilateral: Secondary | ICD-10-CM | POA: Diagnosis not present

## 2015-10-11 DIAGNOSIS — H04123 Dry eye syndrome of bilateral lacrimal glands: Secondary | ICD-10-CM | POA: Diagnosis not present

## 2015-10-11 DIAGNOSIS — H26493 Other secondary cataract, bilateral: Secondary | ICD-10-CM | POA: Diagnosis not present

## 2015-10-11 DIAGNOSIS — Z961 Presence of intraocular lens: Secondary | ICD-10-CM | POA: Diagnosis not present

## 2015-10-11 DIAGNOSIS — H10411 Chronic giant papillary conjunctivitis, right eye: Secondary | ICD-10-CM | POA: Diagnosis not present

## 2015-10-12 DIAGNOSIS — M531 Cervicobrachial syndrome: Secondary | ICD-10-CM | POA: Diagnosis not present

## 2015-10-12 DIAGNOSIS — M9901 Segmental and somatic dysfunction of cervical region: Secondary | ICD-10-CM | POA: Diagnosis not present

## 2015-10-12 DIAGNOSIS — M5032 Other cervical disc degeneration, mid-cervical region, unspecified level: Secondary | ICD-10-CM | POA: Diagnosis not present

## 2015-10-12 DIAGNOSIS — M9902 Segmental and somatic dysfunction of thoracic region: Secondary | ICD-10-CM | POA: Diagnosis not present

## 2015-10-23 DIAGNOSIS — S92354A Nondisplaced fracture of fifth metatarsal bone, right foot, initial encounter for closed fracture: Secondary | ICD-10-CM | POA: Diagnosis not present

## 2015-10-23 DIAGNOSIS — M9901 Segmental and somatic dysfunction of cervical region: Secondary | ICD-10-CM | POA: Diagnosis not present

## 2015-10-23 DIAGNOSIS — M9902 Segmental and somatic dysfunction of thoracic region: Secondary | ICD-10-CM | POA: Diagnosis not present

## 2015-10-23 DIAGNOSIS — M531 Cervicobrachial syndrome: Secondary | ICD-10-CM | POA: Diagnosis not present

## 2015-10-23 DIAGNOSIS — M5032 Other cervical disc degeneration, mid-cervical region, unspecified level: Secondary | ICD-10-CM | POA: Diagnosis not present

## 2015-10-23 DIAGNOSIS — M7541 Impingement syndrome of right shoulder: Secondary | ICD-10-CM | POA: Diagnosis not present

## 2015-10-23 DIAGNOSIS — M75111 Incomplete rotator cuff tear or rupture of right shoulder, not specified as traumatic: Secondary | ICD-10-CM | POA: Diagnosis not present

## 2015-10-26 DIAGNOSIS — Z79899 Other long term (current) drug therapy: Secondary | ICD-10-CM | POA: Diagnosis not present

## 2015-10-26 DIAGNOSIS — M0609 Rheumatoid arthritis without rheumatoid factor, multiple sites: Secondary | ICD-10-CM | POA: Diagnosis not present

## 2015-10-30 DIAGNOSIS — M9902 Segmental and somatic dysfunction of thoracic region: Secondary | ICD-10-CM | POA: Diagnosis not present

## 2015-10-30 DIAGNOSIS — M9901 Segmental and somatic dysfunction of cervical region: Secondary | ICD-10-CM | POA: Diagnosis not present

## 2015-10-30 DIAGNOSIS — M531 Cervicobrachial syndrome: Secondary | ICD-10-CM | POA: Diagnosis not present

## 2015-10-30 DIAGNOSIS — M5032 Other cervical disc degeneration, mid-cervical region, unspecified level: Secondary | ICD-10-CM | POA: Diagnosis not present

## 2015-11-07 DIAGNOSIS — Z23 Encounter for immunization: Secondary | ICD-10-CM | POA: Diagnosis not present

## 2015-11-07 DIAGNOSIS — H532 Diplopia: Secondary | ICD-10-CM | POA: Diagnosis not present

## 2015-11-07 DIAGNOSIS — H04123 Dry eye syndrome of bilateral lacrimal glands: Secondary | ICD-10-CM | POA: Diagnosis not present

## 2015-11-07 DIAGNOSIS — R0989 Other specified symptoms and signs involving the circulatory and respiratory systems: Secondary | ICD-10-CM | POA: Diagnosis not present

## 2015-11-07 DIAGNOSIS — R300 Dysuria: Secondary | ICD-10-CM | POA: Diagnosis not present

## 2015-11-07 DIAGNOSIS — H539 Unspecified visual disturbance: Secondary | ICD-10-CM | POA: Diagnosis not present

## 2015-11-07 DIAGNOSIS — H401232 Low-tension glaucoma, bilateral, moderate stage: Secondary | ICD-10-CM | POA: Diagnosis not present

## 2015-11-07 DIAGNOSIS — R51 Headache: Secondary | ICD-10-CM | POA: Diagnosis not present

## 2015-11-13 DIAGNOSIS — M9901 Segmental and somatic dysfunction of cervical region: Secondary | ICD-10-CM | POA: Diagnosis not present

## 2015-11-13 DIAGNOSIS — M5032 Other cervical disc degeneration, mid-cervical region, unspecified level: Secondary | ICD-10-CM | POA: Diagnosis not present

## 2015-11-13 DIAGNOSIS — M9902 Segmental and somatic dysfunction of thoracic region: Secondary | ICD-10-CM | POA: Diagnosis not present

## 2015-11-13 DIAGNOSIS — M531 Cervicobrachial syndrome: Secondary | ICD-10-CM | POA: Diagnosis not present

## 2015-11-16 DIAGNOSIS — S92354D Nondisplaced fracture of fifth metatarsal bone, right foot, subsequent encounter for fracture with routine healing: Secondary | ICD-10-CM | POA: Diagnosis not present

## 2015-11-17 ENCOUNTER — Other Ambulatory Visit: Payer: Self-pay | Admitting: Family Medicine

## 2015-11-17 DIAGNOSIS — R0989 Other specified symptoms and signs involving the circulatory and respiratory systems: Secondary | ICD-10-CM

## 2015-11-17 DIAGNOSIS — G4489 Other headache syndrome: Secondary | ICD-10-CM

## 2015-11-18 DIAGNOSIS — R300 Dysuria: Secondary | ICD-10-CM | POA: Diagnosis not present

## 2015-11-20 DIAGNOSIS — M5032 Other cervical disc degeneration, mid-cervical region, unspecified level: Secondary | ICD-10-CM | POA: Diagnosis not present

## 2015-11-20 DIAGNOSIS — M531 Cervicobrachial syndrome: Secondary | ICD-10-CM | POA: Diagnosis not present

## 2015-11-20 DIAGNOSIS — M9902 Segmental and somatic dysfunction of thoracic region: Secondary | ICD-10-CM | POA: Diagnosis not present

## 2015-11-20 DIAGNOSIS — M9901 Segmental and somatic dysfunction of cervical region: Secondary | ICD-10-CM | POA: Diagnosis not present

## 2015-11-21 DIAGNOSIS — M2550 Pain in unspecified joint: Secondary | ICD-10-CM | POA: Diagnosis not present

## 2015-11-21 DIAGNOSIS — S92901A Unspecified fracture of right foot, initial encounter for closed fracture: Secondary | ICD-10-CM | POA: Diagnosis not present

## 2015-11-21 DIAGNOSIS — N3000 Acute cystitis without hematuria: Secondary | ICD-10-CM | POA: Diagnosis not present

## 2015-11-21 DIAGNOSIS — M150 Primary generalized (osteo)arthritis: Secondary | ICD-10-CM | POA: Diagnosis not present

## 2015-11-21 DIAGNOSIS — Z79899 Other long term (current) drug therapy: Secondary | ICD-10-CM | POA: Diagnosis not present

## 2015-11-21 DIAGNOSIS — K760 Fatty (change of) liver, not elsewhere classified: Secondary | ICD-10-CM | POA: Diagnosis not present

## 2015-11-21 DIAGNOSIS — M0609 Rheumatoid arthritis without rheumatoid factor, multiple sites: Secondary | ICD-10-CM | POA: Diagnosis not present

## 2015-11-22 ENCOUNTER — Ambulatory Visit (INDEPENDENT_AMBULATORY_CARE_PROVIDER_SITE_OTHER): Payer: Medicare Other | Admitting: Internal Medicine

## 2015-11-22 ENCOUNTER — Encounter: Payer: Self-pay | Admitting: Internal Medicine

## 2015-11-22 VITALS — BP 122/74 | HR 69 | Ht 66.0 in | Wt 178.8 lb

## 2015-11-22 DIAGNOSIS — G4733 Obstructive sleep apnea (adult) (pediatric): Secondary | ICD-10-CM

## 2015-11-22 NOTE — Progress Notes (Signed)
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  Has no problems with mask or supplies.   CPAP seems comfortable and she doesn't recognize sleep problem. Would like CPAP to start a little higher pressure when she first goes to bed. Complains of lack of energy x 3 months- weight up 7 lbs since August. No chest pain. R foot swells a little at times. Easier DOE. Not really sleepy. No acute event. Not known anemic. Feels a little weaker L side. To check this with Dr Drema Dallas.   01/18/14- 47 yo F  never smoker,  RN, followed for OSA, complicated by HBP, CAD      PCP Dr Lucia Gaskins FOLLOWS FOR: Wears CPAP machine; download attached to paper work Still working as an OR Marine scientist at age 4. Download documents excellent compliance and control, using CPAP all night every night with AHI 2.6 per hour. Usual pressure is around 10 but she is on auto set 8-15 and happy with that. Incidental recent bronchitis. Frequent heartburn and easy reflux but does not notice choking at night. Wakes every day nose blowing and cough for a while until she gets cleared.  07/22/14-69 yo F  never smoker,  RN, followed for OSA, complicated by HBP, CAD      PCP Dr Lucia Gaskins FOLLOWS FOR: Wears CPAP auto 8-15/ [facility] every night; DL attached form [employer]. 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 Does want to try a different mask style.  ROS-see HPI Constitutional:   No-   weight loss, night sweats, fevers, chills, +fatigue, lassitude. HEENT:   No-   headaches, difficulty swallowing, tooth/dental problems, sore throat,  pain L jaw      No-  sneezing, itching, ear ache, +nasal congestion, post nasal drip,  CV:  No-   chest pain, orthopnea, PND, swelling in lower extremities, anasarca, dizziness, palpitations Resp:  shortness of breath with exertion or at rest.              productive cough,  non-productive cough,  No- coughing up of blood.              No-   change in color of mucus.  No- wheezing.   Skin: No-   rash or lesions. GI:  No-   heartburn, +indigestion, abdominal pain, nausea, vomiting, GU:  MS:  No-   joint pain or swelling. Neuro-     nothing unusual Psych:  No- change in mood or affect. No depression or anxiety.  No memory loss.  OBJ- Physical Exam General- Alert, Oriented, Affect-appropriate, Distress- none acute. Medium build, looks well Skin- rash-none, lesions- none, excoriation- none Lymphadenopathy- none Head- atraumatic            Eyes- Gross vision intact, PERRLA, conjunctivae and secretions clear            Ears- Hearing, canals-normal. + mild retraction L TM            Nose- Clear, + mild external deviation, +more  narrow on the right,  No-mucus, polyps,  erosion, perforation             Throat- Mallampati II , mucosa clear , drainage- none, tonsils- atrophic. Teeth and                                 oropharynx seem ok. Neck- flexible , trachea midline, no stridor , thyroid nl, carotid no bruit Chest - symmetrical excursion , unlabored           Heart/CV- RRR , no murmur , no gallop  , no rub, nl s1 s2                           - JVD- none , edema- none, stasis changes- none, varices- none           Lung- clear, unlabored, wheeze- none, cough-none , dullness-none, rub- none           Chest wall-  Abd-  Br/ Gen/ Rectal- Not done, not indicated Extrem- cyanosis- none, clubbing, none, atrophy- none, strength- nl Neuro- grossly intact to observation

## 2015-11-22 NOTE — Patient Instructions (Signed)
Order- DME [facility]- continue CPAP auto 5-15, mask of choice, humidifier, supplies, [employer]View   Dx OSA   You can talk to [facility] about trying nasal pillows masks  Please call if we can help

## 2015-11-27 DIAGNOSIS — M62838 Other muscle spasm: Secondary | ICD-10-CM | POA: Diagnosis not present

## 2015-11-27 DIAGNOSIS — M436 Torticollis: Secondary | ICD-10-CM | POA: Diagnosis not present

## 2015-11-27 DIAGNOSIS — M542 Cervicalgia: Secondary | ICD-10-CM | POA: Diagnosis not present

## 2015-11-27 DIAGNOSIS — S134XXA Sprain of ligaments of cervical spine, initial encounter: Secondary | ICD-10-CM | POA: Diagnosis not present

## 2015-11-29 ENCOUNTER — Ambulatory Visit
Admission: RE | Admit: 2015-11-29 | Discharge: 2015-11-29 | Disposition: A | Discharge status: Home / Self Care | Payer: Medicare Other | Source: Ambulatory Visit | Attending: Family Medicine | Admitting: Family Medicine

## 2015-11-29 DIAGNOSIS — G4489 Other headache syndrome: Secondary | ICD-10-CM

## 2015-11-29 DIAGNOSIS — R51 Headache: Secondary | ICD-10-CM | POA: Diagnosis not present

## 2015-11-29 DIAGNOSIS — R0989 Other specified symptoms and signs involving the circulatory and respiratory systems: Secondary | ICD-10-CM

## 2015-11-29 MED ORDER — GADOBENATE DIMEGLUMINE 529 MG/ML IV SOLN
16.0000 mL | Freq: Once | INTRAVENOUS | Status: AC | PRN
  Administered 2015-11-29: 16 mL via INTRAVENOUS

## 2015-12-04 NOTE — Assessment & Plan Note (Signed)
Good compliance and control with definitely improved quality of life. She will continue CPAP at current settings. We discussed comfort measures and mask choices for her to review with her DME company.

## 2015-12-19 ENCOUNTER — Other Ambulatory Visit: Payer: Self-pay | Admitting: Family Medicine

## 2015-12-19 DIAGNOSIS — R9389 Abnormal findings on diagnostic imaging of other specified body structures: Secondary | ICD-10-CM

## 2015-12-21 DIAGNOSIS — M0609 Rheumatoid arthritis without rheumatoid factor, multiple sites: Secondary | ICD-10-CM | POA: Diagnosis not present

## 2016-01-03 ENCOUNTER — Ambulatory Visit
Admission: RE | Admit: 2016-01-03 | Discharge: 2016-01-03 | Disposition: A | Discharge status: Home / Self Care | Payer: Medicare Other | Source: Ambulatory Visit | Attending: Family Medicine | Admitting: Family Medicine

## 2016-01-03 DIAGNOSIS — R9389 Abnormal findings on diagnostic imaging of other specified body structures: Secondary | ICD-10-CM

## 2016-01-03 DIAGNOSIS — I6501 Occlusion and stenosis of right vertebral artery: Secondary | ICD-10-CM | POA: Diagnosis not present

## 2016-01-03 MED ORDER — IOPAMIDOL (ISOVUE-370) INJECTION 76%
60.0000 mL | Freq: Once | INTRAVENOUS | Status: AC | PRN
  Administered 2016-01-03: 60 mL via INTRAVENOUS

## 2016-01-22 DIAGNOSIS — N390 Urinary tract infection, site not specified: Secondary | ICD-10-CM | POA: Diagnosis not present

## 2016-01-22 DIAGNOSIS — R350 Frequency of micturition: Secondary | ICD-10-CM | POA: Diagnosis not present

## 2016-01-25 ENCOUNTER — Telehealth: Payer: Self-pay | Admitting: Cardiovascular Disease

## 2016-01-25 NOTE — Telephone Encounter (Signed)
Closed encounter °

## 2016-02-01 DIAGNOSIS — N390 Urinary tract infection, site not specified: Secondary | ICD-10-CM | POA: Diagnosis not present

## 2016-02-01 DIAGNOSIS — J029 Acute pharyngitis, unspecified: Secondary | ICD-10-CM | POA: Diagnosis not present

## 2016-02-14 DIAGNOSIS — N952 Postmenopausal atrophic vaginitis: Secondary | ICD-10-CM | POA: Diagnosis not present

## 2016-02-14 DIAGNOSIS — R159 Full incontinence of feces: Secondary | ICD-10-CM | POA: Diagnosis not present

## 2016-02-14 DIAGNOSIS — N3946 Mixed incontinence: Secondary | ICD-10-CM | POA: Diagnosis not present

## 2016-02-14 DIAGNOSIS — N816 Rectocele: Secondary | ICD-10-CM | POA: Diagnosis not present

## 2016-02-14 DIAGNOSIS — K469 Unspecified abdominal hernia without obstruction or gangrene: Secondary | ICD-10-CM | POA: Diagnosis not present

## 2016-02-14 DIAGNOSIS — N390 Urinary tract infection, site not specified: Secondary | ICD-10-CM | POA: Diagnosis not present

## 2016-02-15 DIAGNOSIS — M79604 Pain in right leg: Secondary | ICD-10-CM | POA: Diagnosis not present

## 2016-02-15 DIAGNOSIS — H6590 Unspecified nonsuppurative otitis media, unspecified ear: Secondary | ICD-10-CM | POA: Diagnosis not present

## 2016-02-20 DIAGNOSIS — M0609 Rheumatoid arthritis without rheumatoid factor, multiple sites: Secondary | ICD-10-CM | POA: Diagnosis not present

## 2016-02-26 DIAGNOSIS — R05 Cough: Secondary | ICD-10-CM | POA: Diagnosis not present

## 2016-03-04 ENCOUNTER — Encounter: Payer: Self-pay | Admitting: Surgery

## 2016-03-05 DIAGNOSIS — M150 Primary generalized (osteo)arthritis: Secondary | ICD-10-CM | POA: Diagnosis not present

## 2016-03-05 DIAGNOSIS — Z6828 Body mass index (BMI) 28.0-28.9, adult: Secondary | ICD-10-CM | POA: Diagnosis not present

## 2016-03-05 DIAGNOSIS — R945 Abnormal results of liver function studies: Secondary | ICD-10-CM | POA: Diagnosis not present

## 2016-03-05 DIAGNOSIS — M0609 Rheumatoid arthritis without rheumatoid factor, multiple sites: Secondary | ICD-10-CM | POA: Diagnosis not present

## 2016-03-05 DIAGNOSIS — M2550 Pain in unspecified joint: Secondary | ICD-10-CM | POA: Diagnosis not present

## 2016-03-05 DIAGNOSIS — K760 Fatty (change of) liver, not elsewhere classified: Secondary | ICD-10-CM | POA: Diagnosis not present

## 2016-03-05 DIAGNOSIS — J40 Bronchitis, not specified as acute or chronic: Secondary | ICD-10-CM | POA: Diagnosis not present

## 2016-03-05 DIAGNOSIS — E663 Overweight: Secondary | ICD-10-CM | POA: Diagnosis not present

## 2016-03-06 DIAGNOSIS — N819 Female genital prolapse, unspecified: Secondary | ICD-10-CM | POA: Diagnosis not present

## 2016-03-06 DIAGNOSIS — N952 Postmenopausal atrophic vaginitis: Secondary | ICD-10-CM | POA: Diagnosis not present

## 2016-03-06 DIAGNOSIS — N390 Urinary tract infection, site not specified: Secondary | ICD-10-CM | POA: Diagnosis not present

## 2016-03-06 DIAGNOSIS — N393 Stress incontinence (female) (male): Secondary | ICD-10-CM | POA: Diagnosis not present

## 2016-03-06 DIAGNOSIS — K469 Unspecified abdominal hernia without obstruction or gangrene: Secondary | ICD-10-CM | POA: Diagnosis not present

## 2016-03-11 ENCOUNTER — Ambulatory Visit (INDEPENDENT_AMBULATORY_CARE_PROVIDER_SITE_OTHER): Payer: Medicare Other | Admitting: Surgery

## 2016-03-11 VITALS — BP 131/72 | HR 80 | Temp 98.5°F | Resp 16 | Ht 66.0 in | Wt 176.0 lb

## 2016-03-11 DIAGNOSIS — I739 Peripheral vascular disease, unspecified: Secondary | ICD-10-CM

## 2016-03-11 DIAGNOSIS — I6523 Occlusion and stenosis of bilateral carotid arteries: Secondary | ICD-10-CM
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
## 2016-03-13 NOTE — Addendum Note (Signed)
Addended by: Lianne Cure A on: 03/13/2016 12:34 PM   Modules accepted: Orders

## 2016-03-23 DIAGNOSIS — M7541 Impingement syndrome of right shoulder: Secondary | ICD-10-CM | POA: Diagnosis not present

## 2016-03-23 DIAGNOSIS — M75111 Incomplete rotator cuff tear or rupture of right shoulder, not specified as traumatic: Secondary | ICD-10-CM | POA: Diagnosis not present

## 2016-03-26 DIAGNOSIS — M1712 Unilateral primary osteoarthritis, left knee: Secondary | ICD-10-CM | POA: Diagnosis not present

## 2016-03-26 DIAGNOSIS — M7061 Trochanteric bursitis, right hip: Secondary | ICD-10-CM | POA: Diagnosis not present

## 2016-03-29 DIAGNOSIS — M7541 Impingement syndrome of right shoulder: Secondary | ICD-10-CM | POA: Diagnosis not present

## 2016-04-02 DIAGNOSIS — M0609 Rheumatoid arthritis without rheumatoid factor, multiple sites: Secondary | ICD-10-CM | POA: Diagnosis not present

## 2016-04-10 DIAGNOSIS — H04123 Dry eye syndrome of bilateral lacrimal glands: Secondary | ICD-10-CM | POA: Diagnosis not present

## 2016-04-10 DIAGNOSIS — H532 Diplopia: Secondary | ICD-10-CM | POA: Diagnosis not present

## 2016-04-10 DIAGNOSIS — H43813 Vitreous degeneration, bilateral: Secondary | ICD-10-CM | POA: Diagnosis not present

## 2016-04-10 DIAGNOSIS — Z961 Presence of intraocular lens: Secondary | ICD-10-CM | POA: Diagnosis not present

## 2016-04-10 DIAGNOSIS — H5051 Esophoria: Secondary | ICD-10-CM | POA: Diagnosis not present

## 2016-04-10 DIAGNOSIS — H401232 Low-tension glaucoma, bilateral, moderate stage: Secondary | ICD-10-CM | POA: Diagnosis not present

## 2016-04-23 DIAGNOSIS — Z791 Long term (current) use of non-steroidal anti-inflammatories (NSAID): Secondary | ICD-10-CM | POA: Diagnosis not present

## 2016-04-23 DIAGNOSIS — H409 Unspecified glaucoma: Secondary | ICD-10-CM | POA: Diagnosis not present

## 2016-04-23 DIAGNOSIS — F419 Anxiety disorder, unspecified: Secondary | ICD-10-CM | POA: Diagnosis not present

## 2016-04-23 DIAGNOSIS — Z87440 Personal history of urinary (tract) infections: Secondary | ICD-10-CM | POA: Diagnosis not present

## 2016-04-23 DIAGNOSIS — K660 Peritoneal adhesions (postprocedural) (postinfection): Secondary | ICD-10-CM | POA: Diagnosis not present

## 2016-04-23 DIAGNOSIS — Z882 Allergy status to sulfonamides status: Secondary | ICD-10-CM | POA: Diagnosis not present

## 2016-04-23 DIAGNOSIS — Z9989 Dependence on other enabling machines and devices: Secondary | ICD-10-CM | POA: Diagnosis not present

## 2016-04-23 DIAGNOSIS — N993 Prolapse of vaginal vault after hysterectomy: Secondary | ICD-10-CM | POA: Diagnosis not present

## 2016-04-23 DIAGNOSIS — G4733 Obstructive sleep apnea (adult) (pediatric): Secondary | ICD-10-CM | POA: Diagnosis not present

## 2016-04-23 DIAGNOSIS — I2510 Atherosclerotic heart disease of native coronary artery without angina pectoris: Secondary | ICD-10-CM | POA: Diagnosis not present

## 2016-04-23 DIAGNOSIS — N895 Stricture and atresia of vagina: Secondary | ICD-10-CM | POA: Diagnosis not present

## 2016-04-23 DIAGNOSIS — K219 Gastro-esophageal reflux disease without esophagitis: Secondary | ICD-10-CM | POA: Diagnosis not present

## 2016-04-23 DIAGNOSIS — R748 Abnormal levels of other serum enzymes: Secondary | ICD-10-CM | POA: Diagnosis not present

## 2016-04-23 DIAGNOSIS — E785 Hyperlipidemia, unspecified: Secondary | ICD-10-CM | POA: Diagnosis not present

## 2016-04-23 DIAGNOSIS — K469 Unspecified abdominal hernia without obstruction or gangrene: Secondary | ICD-10-CM | POA: Diagnosis not present

## 2016-04-23 DIAGNOSIS — Z79899 Other long term (current) drug therapy: Secondary | ICD-10-CM | POA: Diagnosis not present

## 2016-04-23 DIAGNOSIS — Z7982 Long term (current) use of aspirin: Secondary | ICD-10-CM | POA: Diagnosis not present

## 2016-04-23 DIAGNOSIS — M1990 Unspecified osteoarthritis, unspecified site: Secondary | ICD-10-CM | POA: Diagnosis not present

## 2016-04-23 DIAGNOSIS — R600 Localized edema: Secondary | ICD-10-CM | POA: Diagnosis not present

## 2016-04-23 DIAGNOSIS — N393 Stress incontinence (female) (male): Secondary | ICD-10-CM | POA: Diagnosis not present

## 2016-04-23 DIAGNOSIS — F329 Major depressive disorder, single episode, unspecified: Secondary | ICD-10-CM | POA: Diagnosis not present

## 2016-04-23 DIAGNOSIS — I7789 Other specified disorders of arteries and arterioles: Secondary | ICD-10-CM | POA: Diagnosis not present

## 2016-04-23 DIAGNOSIS — R319 Hematuria, unspecified: Secondary | ICD-10-CM | POA: Diagnosis not present

## 2016-04-23 DIAGNOSIS — Z91048 Other nonmedicinal substance allergy status: Secondary | ICD-10-CM | POA: Diagnosis not present

## 2016-04-23 DIAGNOSIS — I10 Essential (primary) hypertension: Secondary | ICD-10-CM | POA: Diagnosis not present

## 2016-04-23 DIAGNOSIS — E119 Type 2 diabetes mellitus without complications: Secondary | ICD-10-CM | POA: Diagnosis not present

## 2016-04-24 DIAGNOSIS — I10 Essential (primary) hypertension: Secondary | ICD-10-CM | POA: Diagnosis not present

## 2016-04-29 DIAGNOSIS — N993 Prolapse of vaginal vault after hysterectomy: Secondary | ICD-10-CM | POA: Diagnosis not present

## 2016-04-29 DIAGNOSIS — I10 Essential (primary) hypertension: Secondary | ICD-10-CM | POA: Diagnosis not present

## 2016-04-29 DIAGNOSIS — N895 Stricture and atresia of vagina: Secondary | ICD-10-CM | POA: Diagnosis not present

## 2016-04-29 DIAGNOSIS — K660 Peritoneal adhesions (postprocedural) (postinfection): Secondary | ICD-10-CM | POA: Diagnosis not present

## 2016-04-29 DIAGNOSIS — N815 Vaginal enterocele: Secondary | ICD-10-CM | POA: Diagnosis not present

## 2016-04-29 DIAGNOSIS — N841 Polyp of cervix uteri: Secondary | ICD-10-CM | POA: Diagnosis not present

## 2016-04-29 DIAGNOSIS — E785 Hyperlipidemia, unspecified: Secondary | ICD-10-CM | POA: Diagnosis not present

## 2016-04-29 DIAGNOSIS — N393 Stress incontinence (female) (male): Secondary | ICD-10-CM | POA: Diagnosis not present

## 2016-04-30 DIAGNOSIS — K660 Peritoneal adhesions (postprocedural) (postinfection): Secondary | ICD-10-CM | POA: Diagnosis not present

## 2016-04-30 DIAGNOSIS — N993 Prolapse of vaginal vault after hysterectomy: Secondary | ICD-10-CM | POA: Diagnosis not present

## 2016-04-30 DIAGNOSIS — I10 Essential (primary) hypertension: Secondary | ICD-10-CM | POA: Diagnosis not present

## 2016-04-30 DIAGNOSIS — E785 Hyperlipidemia, unspecified: Secondary | ICD-10-CM | POA: Diagnosis not present

## 2016-04-30 DIAGNOSIS — N895 Stricture and atresia of vagina: Secondary | ICD-10-CM | POA: Diagnosis not present

## 2016-04-30 DIAGNOSIS — N393 Stress incontinence (female) (male): Secondary | ICD-10-CM | POA: Diagnosis not present

## 2016-05-27 DIAGNOSIS — N390 Urinary tract infection, site not specified: Secondary | ICD-10-CM | POA: Diagnosis not present

## 2016-05-27 DIAGNOSIS — N3090 Cystitis, unspecified without hematuria: Secondary | ICD-10-CM | POA: Diagnosis not present

## 2016-05-29 DIAGNOSIS — B078 Other viral warts: Secondary | ICD-10-CM | POA: Diagnosis not present

## 2016-05-29 DIAGNOSIS — C4442 Squamous cell carcinoma of skin of scalp and neck: Secondary | ICD-10-CM | POA: Diagnosis not present

## 2016-05-29 DIAGNOSIS — L821 Other seborrheic keratosis: Secondary | ICD-10-CM | POA: Diagnosis not present

## 2016-05-29 DIAGNOSIS — D485 Neoplasm of uncertain behavior of skin: Secondary | ICD-10-CM | POA: Diagnosis not present

## 2016-06-04 DIAGNOSIS — Z79899 Other long term (current) drug therapy: Secondary | ICD-10-CM | POA: Diagnosis not present

## 2016-06-04 DIAGNOSIS — M2550 Pain in unspecified joint: Secondary | ICD-10-CM | POA: Diagnosis not present

## 2016-06-04 DIAGNOSIS — M0609 Rheumatoid arthritis without rheumatoid factor, multiple sites: Secondary | ICD-10-CM | POA: Diagnosis not present

## 2016-06-04 DIAGNOSIS — M150 Primary generalized (osteo)arthritis: Secondary | ICD-10-CM | POA: Diagnosis not present

## 2016-06-04 DIAGNOSIS — R300 Dysuria: Secondary | ICD-10-CM | POA: Diagnosis not present

## 2016-06-04 DIAGNOSIS — E663 Overweight: Secondary | ICD-10-CM | POA: Diagnosis not present

## 2016-06-04 DIAGNOSIS — Z6828 Body mass index (BMI) 28.0-28.9, adult: Secondary | ICD-10-CM | POA: Diagnosis not present

## 2016-06-19 DIAGNOSIS — R8279 Other abnormal findings on microbiological examination of urine: Secondary | ICD-10-CM | POA: Diagnosis not present

## 2016-06-24 DIAGNOSIS — M0609 Rheumatoid arthritis without rheumatoid factor, multiple sites: Secondary | ICD-10-CM | POA: Diagnosis not present

## 2016-06-24 DIAGNOSIS — Z79899 Other long term (current) drug therapy: Secondary | ICD-10-CM | POA: Diagnosis not present

## 2016-06-27 DIAGNOSIS — L820 Inflamed seborrheic keratosis: Secondary | ICD-10-CM | POA: Diagnosis not present

## 2016-06-27 DIAGNOSIS — Z85828 Personal history of other malignant neoplasm of skin: Secondary | ICD-10-CM | POA: Diagnosis not present

## 2016-06-27 DIAGNOSIS — B078 Other viral warts: Secondary | ICD-10-CM | POA: Diagnosis not present

## 2016-07-03 DIAGNOSIS — M5441 Lumbago with sciatica, right side: Secondary | ICD-10-CM | POA: Diagnosis not present

## 2016-07-11 DIAGNOSIS — H43813 Vitreous degeneration, bilateral: Secondary | ICD-10-CM | POA: Diagnosis not present

## 2016-07-11 DIAGNOSIS — H401232 Low-tension glaucoma, bilateral, moderate stage: Secondary | ICD-10-CM | POA: Diagnosis not present

## 2016-07-11 DIAGNOSIS — H04123 Dry eye syndrome of bilateral lacrimal glands: Secondary | ICD-10-CM | POA: Diagnosis not present

## 2016-07-11 DIAGNOSIS — H5051 Esophoria: Secondary | ICD-10-CM | POA: Diagnosis not present

## 2016-07-11 DIAGNOSIS — H532 Diplopia: Secondary | ICD-10-CM | POA: Diagnosis not present

## 2016-07-11 DIAGNOSIS — Z961 Presence of intraocular lens: Secondary | ICD-10-CM | POA: Diagnosis not present

## 2016-07-18 DIAGNOSIS — B078 Other viral warts: Secondary | ICD-10-CM | POA: Diagnosis not present

## 2016-07-23 ENCOUNTER — Encounter (HOSPITAL_COMMUNITY): Payer: Medicare Other

## 2016-07-23 ENCOUNTER — Ambulatory Visit: Payer: Medicare Other | Admitting: Vascular Surgery

## 2016-07-24 DIAGNOSIS — N390 Urinary tract infection, site not specified: Secondary | ICD-10-CM | POA: Diagnosis not present

## 2016-07-24 DIAGNOSIS — R8290 Unspecified abnormal findings in urine: Secondary | ICD-10-CM | POA: Diagnosis not present

## 2016-07-31 DIAGNOSIS — G8929 Other chronic pain: Secondary | ICD-10-CM | POA: Diagnosis not present

## 2016-07-31 DIAGNOSIS — M5136 Other intervertebral disc degeneration, lumbar region: Secondary | ICD-10-CM | POA: Diagnosis not present

## 2016-07-31 DIAGNOSIS — M5441 Lumbago with sciatica, right side: Secondary | ICD-10-CM | POA: Diagnosis not present

## 2016-07-31 DIAGNOSIS — M4316 Spondylolisthesis, lumbar region: Secondary | ICD-10-CM | POA: Diagnosis not present

## 2016-08-02 DIAGNOSIS — N390 Urinary tract infection, site not specified: Secondary | ICD-10-CM | POA: Diagnosis not present

## 2016-08-08 DIAGNOSIS — G8929 Other chronic pain: Secondary | ICD-10-CM | POA: Diagnosis not present

## 2016-08-08 DIAGNOSIS — M5441 Lumbago with sciatica, right side: Secondary | ICD-10-CM | POA: Diagnosis not present

## 2016-08-15 DIAGNOSIS — M48062 Spinal stenosis, lumbar region with neurogenic claudication: Secondary | ICD-10-CM | POA: Diagnosis not present

## 2016-08-15 DIAGNOSIS — G8929 Other chronic pain: Secondary | ICD-10-CM | POA: Diagnosis not present

## 2016-08-15 DIAGNOSIS — M5441 Lumbago with sciatica, right side: Secondary | ICD-10-CM | POA: Diagnosis not present

## 2016-08-15 DIAGNOSIS — M4316 Spondylolisthesis, lumbar region: Secondary | ICD-10-CM | POA: Diagnosis not present

## 2016-08-21 DIAGNOSIS — M0609 Rheumatoid arthritis without rheumatoid factor, multiple sites: Secondary | ICD-10-CM | POA: Diagnosis not present

## 2016-08-28 DIAGNOSIS — M48062 Spinal stenosis, lumbar region with neurogenic claudication: Secondary | ICD-10-CM | POA: Diagnosis not present

## 2016-08-29 DIAGNOSIS — N8110 Cystocele, unspecified: Secondary | ICD-10-CM | POA: Diagnosis not present

## 2016-08-29 DIAGNOSIS — N281 Cyst of kidney, acquired: Secondary | ICD-10-CM | POA: Diagnosis not present

## 2016-08-29 DIAGNOSIS — N3946 Mixed incontinence: Secondary | ICD-10-CM | POA: Diagnosis not present

## 2016-08-29 DIAGNOSIS — N3020 Other chronic cystitis without hematuria: Secondary | ICD-10-CM | POA: Diagnosis not present

## 2016-09-04 DIAGNOSIS — M48062 Spinal stenosis, lumbar region with neurogenic claudication: Secondary | ICD-10-CM | POA: Diagnosis not present

## 2016-09-05 DIAGNOSIS — M2550 Pain in unspecified joint: Secondary | ICD-10-CM | POA: Diagnosis not present

## 2016-09-05 DIAGNOSIS — M545 Low back pain: Secondary | ICD-10-CM | POA: Diagnosis not present

## 2016-09-05 DIAGNOSIS — M0609 Rheumatoid arthritis without rheumatoid factor, multiple sites: Secondary | ICD-10-CM | POA: Diagnosis not present

## 2016-09-05 DIAGNOSIS — Z79899 Other long term (current) drug therapy: Secondary | ICD-10-CM | POA: Diagnosis not present

## 2016-09-05 DIAGNOSIS — E663 Overweight: Secondary | ICD-10-CM | POA: Diagnosis not present

## 2016-09-05 DIAGNOSIS — M150 Primary generalized (osteo)arthritis: Secondary | ICD-10-CM | POA: Diagnosis not present

## 2016-09-05 DIAGNOSIS — Z6828 Body mass index (BMI) 28.0-28.9, adult: Secondary | ICD-10-CM | POA: Diagnosis not present

## 2016-09-09 DIAGNOSIS — M48062 Spinal stenosis, lumbar region with neurogenic claudication: Secondary | ICD-10-CM | POA: Diagnosis not present

## 2016-09-12 DIAGNOSIS — M48062 Spinal stenosis, lumbar region with neurogenic claudication: Secondary | ICD-10-CM | POA: Diagnosis not present

## 2016-09-17 DIAGNOSIS — M48062 Spinal stenosis, lumbar region with neurogenic claudication: Secondary | ICD-10-CM | POA: Diagnosis not present

## 2016-09-18 DIAGNOSIS — N3000 Acute cystitis without hematuria: Secondary | ICD-10-CM | POA: Diagnosis not present

## 2016-09-18 DIAGNOSIS — N390 Urinary tract infection, site not specified: Secondary | ICD-10-CM | POA: Diagnosis not present

## 2016-09-19 DIAGNOSIS — M48062 Spinal stenosis, lumbar region with neurogenic claudication: Secondary | ICD-10-CM | POA: Diagnosis not present

## 2016-09-24 DIAGNOSIS — M48062 Spinal stenosis, lumbar region with neurogenic claudication: Secondary | ICD-10-CM | POA: Diagnosis not present

## 2016-09-25 DIAGNOSIS — N390 Urinary tract infection, site not specified: Secondary | ICD-10-CM | POA: Diagnosis not present

## 2016-09-26 DIAGNOSIS — M4316 Spondylolisthesis, lumbar region: Secondary | ICD-10-CM | POA: Diagnosis not present

## 2016-09-26 DIAGNOSIS — M48062 Spinal stenosis, lumbar region with neurogenic claudication: Secondary | ICD-10-CM | POA: Diagnosis not present

## 2016-09-26 DIAGNOSIS — M5441 Lumbago with sciatica, right side: Secondary | ICD-10-CM | POA: Diagnosis not present

## 2016-09-26 DIAGNOSIS — G8929 Other chronic pain: Secondary | ICD-10-CM | POA: Diagnosis not present

## 2016-10-01 DIAGNOSIS — M48062 Spinal stenosis, lumbar region with neurogenic claudication: Secondary | ICD-10-CM | POA: Diagnosis not present

## 2016-10-09 DIAGNOSIS — M48062 Spinal stenosis, lumbar region with neurogenic claudication: Secondary | ICD-10-CM | POA: Diagnosis not present

## 2016-10-16 DIAGNOSIS — N390 Urinary tract infection, site not specified: Secondary | ICD-10-CM | POA: Diagnosis not present

## 2016-10-22 DIAGNOSIS — M0609 Rheumatoid arthritis without rheumatoid factor, multiple sites: Secondary | ICD-10-CM | POA: Diagnosis not present

## 2016-10-24 DIAGNOSIS — G479 Sleep disorder, unspecified: Secondary | ICD-10-CM | POA: Diagnosis not present

## 2016-10-24 DIAGNOSIS — E119 Type 2 diabetes mellitus without complications: Secondary | ICD-10-CM | POA: Diagnosis not present

## 2016-10-24 DIAGNOSIS — E559 Vitamin D deficiency, unspecified: Secondary | ICD-10-CM | POA: Diagnosis not present

## 2016-10-24 DIAGNOSIS — N390 Urinary tract infection, site not specified: Secondary | ICD-10-CM | POA: Diagnosis not present

## 2016-10-24 DIAGNOSIS — E7800 Pure hypercholesterolemia, unspecified: Secondary | ICD-10-CM | POA: Diagnosis not present

## 2016-10-24 DIAGNOSIS — I10 Essential (primary) hypertension: Secondary | ICD-10-CM | POA: Diagnosis not present

## 2016-10-24 DIAGNOSIS — M1990 Unspecified osteoarthritis, unspecified site: Secondary | ICD-10-CM | POA: Diagnosis not present

## 2016-10-24 DIAGNOSIS — N183 Chronic kidney disease, stage 3 (moderate): Secondary | ICD-10-CM | POA: Diagnosis not present

## 2016-10-24 DIAGNOSIS — R7989 Other specified abnormal findings of blood chemistry: Secondary | ICD-10-CM | POA: Diagnosis not present

## 2016-10-24 DIAGNOSIS — Z23 Encounter for immunization: Secondary | ICD-10-CM | POA: Diagnosis not present

## 2016-10-24 DIAGNOSIS — F419 Anxiety disorder, unspecified: Secondary | ICD-10-CM | POA: Diagnosis not present

## 2016-11-01 DIAGNOSIS — L570 Actinic keratosis: Secondary | ICD-10-CM | POA: Diagnosis not present

## 2016-11-01 DIAGNOSIS — B078 Other viral warts: Secondary | ICD-10-CM | POA: Diagnosis not present

## 2016-11-12 ENCOUNTER — Other Ambulatory Visit: Payer: Self-pay | Admitting: Gastroenterology

## 2016-11-12 DIAGNOSIS — Z800 Family history of malignant neoplasm of digestive organs: Secondary | ICD-10-CM | POA: Diagnosis not present

## 2016-11-12 DIAGNOSIS — R932 Abnormal findings on diagnostic imaging of liver and biliary tract: Secondary | ICD-10-CM | POA: Diagnosis not present

## 2016-11-12 DIAGNOSIS — R748 Abnormal levels of other serum enzymes: Secondary | ICD-10-CM | POA: Diagnosis not present

## 2016-11-12 DIAGNOSIS — R945 Abnormal results of liver function studies: Principal | ICD-10-CM

## 2016-11-12 DIAGNOSIS — R7989 Other specified abnormal findings of blood chemistry: Secondary | ICD-10-CM

## 2016-11-19 ENCOUNTER — Ambulatory Visit
Admission: RE | Admit: 2016-11-19 | Discharge: 2016-11-19 | Disposition: A | Discharge status: Home / Self Care | Payer: Medicare Other | Source: Ambulatory Visit | Attending: Gastroenterology | Admitting: Gastroenterology

## 2016-11-19 DIAGNOSIS — R7989 Other specified abnormal findings of blood chemistry: Secondary | ICD-10-CM

## 2016-11-19 DIAGNOSIS — R945 Abnormal results of liver function studies: Principal | ICD-10-CM

## 2016-11-22 DIAGNOSIS — L570 Actinic keratosis: Secondary | ICD-10-CM | POA: Diagnosis not present

## 2016-11-22 DIAGNOSIS — L821 Other seborrheic keratosis: Secondary | ICD-10-CM | POA: Diagnosis not present

## 2016-11-22 DIAGNOSIS — Z85828 Personal history of other malignant neoplasm of skin: Secondary | ICD-10-CM | POA: Diagnosis not present

## 2016-11-22 DIAGNOSIS — B078 Other viral warts: Secondary | ICD-10-CM | POA: Diagnosis not present

## 2016-12-17 DIAGNOSIS — M0609 Rheumatoid arthritis without rheumatoid factor, multiple sites: Secondary | ICD-10-CM | POA: Diagnosis not present

## 2016-12-17 DIAGNOSIS — Z79899 Other long term (current) drug therapy: Secondary | ICD-10-CM | POA: Diagnosis not present

## 2016-12-20 DIAGNOSIS — Z85828 Personal history of other malignant neoplasm of skin: Secondary | ICD-10-CM | POA: Diagnosis not present

## 2016-12-20 DIAGNOSIS — L918 Other hypertrophic disorders of the skin: Secondary | ICD-10-CM | POA: Diagnosis not present

## 2016-12-20 DIAGNOSIS — B078 Other viral warts: Secondary | ICD-10-CM | POA: Diagnosis not present

## 2016-12-27 DIAGNOSIS — M9902 Segmental and somatic dysfunction of thoracic region: Secondary | ICD-10-CM | POA: Diagnosis not present

## 2016-12-27 DIAGNOSIS — M531 Cervicobrachial syndrome: Secondary | ICD-10-CM | POA: Diagnosis not present

## 2016-12-27 DIAGNOSIS — M9901 Segmental and somatic dysfunction of cervical region: Secondary | ICD-10-CM | POA: Diagnosis not present

## 2016-12-27 DIAGNOSIS — M5032 Other cervical disc degeneration, mid-cervical region, unspecified level: Secondary | ICD-10-CM | POA: Diagnosis not present

## 2016-12-31 DIAGNOSIS — R932 Abnormal findings on diagnostic imaging of liver and biliary tract: Secondary | ICD-10-CM | POA: Diagnosis not present

## 2016-12-31 DIAGNOSIS — M531 Cervicobrachial syndrome: Secondary | ICD-10-CM | POA: Diagnosis not present

## 2016-12-31 DIAGNOSIS — E7800 Pure hypercholesterolemia, unspecified: Secondary | ICD-10-CM | POA: Diagnosis not present

## 2016-12-31 DIAGNOSIS — M9901 Segmental and somatic dysfunction of cervical region: Secondary | ICD-10-CM | POA: Diagnosis not present

## 2016-12-31 DIAGNOSIS — K580 Irritable bowel syndrome with diarrhea: Secondary | ICD-10-CM | POA: Diagnosis not present

## 2016-12-31 DIAGNOSIS — M5032 Other cervical disc degeneration, mid-cervical region, unspecified level: Secondary | ICD-10-CM | POA: Diagnosis not present

## 2016-12-31 DIAGNOSIS — M9902 Segmental and somatic dysfunction of thoracic region: Secondary | ICD-10-CM | POA: Diagnosis not present

## 2016-12-31 DIAGNOSIS — M1990 Unspecified osteoarthritis, unspecified site: Secondary | ICD-10-CM | POA: Diagnosis not present

## 2016-12-31 DIAGNOSIS — R748 Abnormal levels of other serum enzymes: Secondary | ICD-10-CM | POA: Diagnosis not present

## 2017-01-07 DIAGNOSIS — R7989 Other specified abnormal findings of blood chemistry: Secondary | ICD-10-CM | POA: Diagnosis not present

## 2017-01-07 DIAGNOSIS — M2550 Pain in unspecified joint: Secondary | ICD-10-CM | POA: Diagnosis not present

## 2017-01-07 DIAGNOSIS — M0609 Rheumatoid arthritis without rheumatoid factor, multiple sites: Secondary | ICD-10-CM | POA: Diagnosis not present

## 2017-01-07 DIAGNOSIS — M150 Primary generalized (osteo)arthritis: Secondary | ICD-10-CM | POA: Diagnosis not present

## 2017-01-07 DIAGNOSIS — Z79899 Other long term (current) drug therapy: Secondary | ICD-10-CM | POA: Diagnosis not present

## 2017-01-17 DIAGNOSIS — H401233 Low-tension glaucoma, bilateral, severe stage: Secondary | ICD-10-CM | POA: Diagnosis not present

## 2017-01-17 DIAGNOSIS — E119 Type 2 diabetes mellitus without complications: Secondary | ICD-10-CM | POA: Diagnosis not present

## 2017-01-17 DIAGNOSIS — H26491 Other secondary cataract, right eye: Secondary | ICD-10-CM | POA: Diagnosis not present

## 2017-01-17 DIAGNOSIS — Z961 Presence of intraocular lens: Secondary | ICD-10-CM | POA: Diagnosis not present

## 2017-01-22 DIAGNOSIS — N907 Vulvar cyst: Secondary | ICD-10-CM | POA: Diagnosis not present

## 2017-01-22 DIAGNOSIS — N390 Urinary tract infection, site not specified: Secondary | ICD-10-CM | POA: Diagnosis not present

## 2017-01-22 DIAGNOSIS — Z780 Asymptomatic menopausal state: Secondary | ICD-10-CM | POA: Diagnosis not present

## 2017-02-06 DIAGNOSIS — M1712 Unilateral primary osteoarthritis, left knee: Secondary | ICD-10-CM | POA: Diagnosis not present

## 2017-02-07 DIAGNOSIS — R7989 Other specified abnormal findings of blood chemistry: Secondary | ICD-10-CM | POA: Diagnosis not present

## 2017-02-10 DIAGNOSIS — R932 Abnormal findings on diagnostic imaging of liver and biliary tract: Secondary | ICD-10-CM | POA: Diagnosis not present

## 2017-02-10 DIAGNOSIS — M1990 Unspecified osteoarthritis, unspecified site: Secondary | ICD-10-CM | POA: Diagnosis not present

## 2017-02-10 DIAGNOSIS — Z800 Family history of malignant neoplasm of digestive organs: Secondary | ICD-10-CM | POA: Diagnosis not present

## 2017-02-10 DIAGNOSIS — E7800 Pure hypercholesterolemia, unspecified: Secondary | ICD-10-CM | POA: Diagnosis not present

## 2017-02-10 DIAGNOSIS — E119 Type 2 diabetes mellitus without complications: Secondary | ICD-10-CM | POA: Diagnosis not present

## 2017-02-10 DIAGNOSIS — R7989 Other specified abnormal findings of blood chemistry: Secondary | ICD-10-CM | POA: Diagnosis not present

## 2017-02-10 DIAGNOSIS — R748 Abnormal levels of other serum enzymes: Secondary | ICD-10-CM | POA: Diagnosis not present

## 2017-02-10 DIAGNOSIS — K580 Irritable bowel syndrome with diarrhea: Secondary | ICD-10-CM | POA: Diagnosis not present

## 2017-02-20 DIAGNOSIS — R932 Abnormal findings on diagnostic imaging of liver and biliary tract: Secondary | ICD-10-CM | POA: Diagnosis not present

## 2017-02-20 DIAGNOSIS — R748 Abnormal levels of other serum enzymes: Secondary | ICD-10-CM | POA: Diagnosis not present

## 2017-02-20 DIAGNOSIS — E119 Type 2 diabetes mellitus without complications: Secondary | ICD-10-CM | POA: Diagnosis not present

## 2017-02-20 DIAGNOSIS — H6590 Unspecified nonsuppurative otitis media, unspecified ear: Secondary | ICD-10-CM | POA: Diagnosis not present

## 2017-02-20 DIAGNOSIS — R945 Abnormal results of liver function studies: Secondary | ICD-10-CM | POA: Diagnosis not present

## 2017-02-24 ENCOUNTER — Other Ambulatory Visit: Payer: Self-pay | Admitting: Family Medicine

## 2017-02-24 DIAGNOSIS — Z139 Encounter for screening, unspecified: Secondary | ICD-10-CM

## 2017-03-04 DIAGNOSIS — M0609 Rheumatoid arthritis without rheumatoid factor, multiple sites: Secondary | ICD-10-CM | POA: Diagnosis not present

## 2017-03-07 DIAGNOSIS — L821 Other seborrheic keratosis: Secondary | ICD-10-CM | POA: Diagnosis not present

## 2017-03-07 DIAGNOSIS — L570 Actinic keratosis: Secondary | ICD-10-CM | POA: Diagnosis not present

## 2017-03-07 DIAGNOSIS — B078 Other viral warts: Secondary | ICD-10-CM | POA: Diagnosis not present

## 2017-03-07 DIAGNOSIS — Z85828 Personal history of other malignant neoplasm of skin: Secondary | ICD-10-CM | POA: Diagnosis not present

## 2017-03-10 DIAGNOSIS — M0609 Rheumatoid arthritis without rheumatoid factor, multiple sites: Secondary | ICD-10-CM | POA: Diagnosis not present

## 2017-03-10 DIAGNOSIS — M2550 Pain in unspecified joint: Secondary | ICD-10-CM | POA: Diagnosis not present

## 2017-03-10 DIAGNOSIS — R7989 Other specified abnormal findings of blood chemistry: Secondary | ICD-10-CM | POA: Diagnosis not present

## 2017-03-10 DIAGNOSIS — M150 Primary generalized (osteo)arthritis: Secondary | ICD-10-CM | POA: Diagnosis not present

## 2017-03-10 DIAGNOSIS — Z6829 Body mass index (BMI) 29.0-29.9, adult: Secondary | ICD-10-CM | POA: Diagnosis not present

## 2017-03-10 DIAGNOSIS — K760 Fatty (change of) liver, not elsewhere classified: Secondary | ICD-10-CM | POA: Diagnosis not present

## 2017-03-10 DIAGNOSIS — E663 Overweight: Secondary | ICD-10-CM | POA: Diagnosis not present

## 2017-03-10 DIAGNOSIS — Z79899 Other long term (current) drug therapy: Secondary | ICD-10-CM | POA: Diagnosis not present

## 2017-03-12 DIAGNOSIS — M0609 Rheumatoid arthritis without rheumatoid factor, multiple sites: Secondary | ICD-10-CM | POA: Diagnosis not present

## 2017-03-13 ENCOUNTER — Other Ambulatory Visit (HOSPITAL_COMMUNITY): Payer: Self-pay | Admitting: Gastroenterology

## 2017-03-13 DIAGNOSIS — R7989 Other specified abnormal findings of blood chemistry: Secondary | ICD-10-CM

## 2017-03-13 DIAGNOSIS — R945 Abnormal results of liver function studies: Principal | ICD-10-CM

## 2017-03-14 ENCOUNTER — Ambulatory Visit
Admission: RE | Admit: 2017-03-14 | Discharge: 2017-03-14 | Disposition: A | Discharge status: Home / Self Care | Payer: Medicare Other | Source: Ambulatory Visit | Attending: Family Medicine | Admitting: Family Medicine

## 2017-03-14 DIAGNOSIS — Z1231 Encounter for screening mammogram for malignant neoplasm of breast: Secondary | ICD-10-CM | POA: Diagnosis not present

## 2017-03-14 DIAGNOSIS — Z139 Encounter for screening, unspecified: Secondary | ICD-10-CM

## 2017-03-17 DIAGNOSIS — M9903 Segmental and somatic dysfunction of lumbar region: Secondary | ICD-10-CM | POA: Diagnosis not present

## 2017-03-17 DIAGNOSIS — M5032 Other cervical disc degeneration, mid-cervical region, unspecified level: Secondary | ICD-10-CM | POA: Diagnosis not present

## 2017-03-17 DIAGNOSIS — M5415 Radiculopathy, thoracolumbar region: Secondary | ICD-10-CM | POA: Diagnosis not present

## 2017-03-17 DIAGNOSIS — M9902 Segmental and somatic dysfunction of thoracic region: Secondary | ICD-10-CM | POA: Diagnosis not present

## 2017-03-17 DIAGNOSIS — M9901 Segmental and somatic dysfunction of cervical region: Secondary | ICD-10-CM | POA: Diagnosis not present

## 2017-03-17 DIAGNOSIS — M5136 Other intervertebral disc degeneration, lumbar region: Secondary | ICD-10-CM | POA: Diagnosis not present

## 2017-03-19 DIAGNOSIS — M9902 Segmental and somatic dysfunction of thoracic region: Secondary | ICD-10-CM | POA: Diagnosis not present

## 2017-03-19 DIAGNOSIS — M5415 Radiculopathy, thoracolumbar region: Secondary | ICD-10-CM | POA: Diagnosis not present

## 2017-03-19 DIAGNOSIS — M5136 Other intervertebral disc degeneration, lumbar region: Secondary | ICD-10-CM | POA: Diagnosis not present

## 2017-03-19 DIAGNOSIS — M9903 Segmental and somatic dysfunction of lumbar region: Secondary | ICD-10-CM | POA: Diagnosis not present

## 2017-03-19 DIAGNOSIS — M9901 Segmental and somatic dysfunction of cervical region: Secondary | ICD-10-CM | POA: Diagnosis not present

## 2017-03-19 DIAGNOSIS — M5032 Other cervical disc degeneration, mid-cervical region, unspecified level: Secondary | ICD-10-CM | POA: Diagnosis not present

## 2017-03-21 DIAGNOSIS — M5415 Radiculopathy, thoracolumbar region: Secondary | ICD-10-CM | POA: Diagnosis not present

## 2017-03-21 DIAGNOSIS — M9903 Segmental and somatic dysfunction of lumbar region: Secondary | ICD-10-CM | POA: Diagnosis not present

## 2017-03-21 DIAGNOSIS — M9901 Segmental and somatic dysfunction of cervical region: Secondary | ICD-10-CM | POA: Diagnosis not present

## 2017-03-21 DIAGNOSIS — M5136 Other intervertebral disc degeneration, lumbar region: Secondary | ICD-10-CM | POA: Diagnosis not present

## 2017-03-21 DIAGNOSIS — M5032 Other cervical disc degeneration, mid-cervical region, unspecified level: Secondary | ICD-10-CM | POA: Diagnosis not present

## 2017-03-21 DIAGNOSIS — M9902 Segmental and somatic dysfunction of thoracic region: Secondary | ICD-10-CM | POA: Diagnosis not present

## 2017-03-24 DIAGNOSIS — M9902 Segmental and somatic dysfunction of thoracic region: Secondary | ICD-10-CM | POA: Diagnosis not present

## 2017-03-24 DIAGNOSIS — M9903 Segmental and somatic dysfunction of lumbar region: Secondary | ICD-10-CM | POA: Diagnosis not present

## 2017-03-24 DIAGNOSIS — M5136 Other intervertebral disc degeneration, lumbar region: Secondary | ICD-10-CM | POA: Diagnosis not present

## 2017-03-24 DIAGNOSIS — M5415 Radiculopathy, thoracolumbar region: Secondary | ICD-10-CM | POA: Diagnosis not present

## 2017-03-24 DIAGNOSIS — M5032 Other cervical disc degeneration, mid-cervical region, unspecified level: Secondary | ICD-10-CM | POA: Diagnosis not present

## 2017-03-24 DIAGNOSIS — M9901 Segmental and somatic dysfunction of cervical region: Secondary | ICD-10-CM | POA: Diagnosis not present

## 2017-03-27 DIAGNOSIS — M5415 Radiculopathy, thoracolumbar region: Secondary | ICD-10-CM | POA: Diagnosis not present

## 2017-03-27 DIAGNOSIS — M9902 Segmental and somatic dysfunction of thoracic region: Secondary | ICD-10-CM | POA: Diagnosis not present

## 2017-03-27 DIAGNOSIS — M9901 Segmental and somatic dysfunction of cervical region: Secondary | ICD-10-CM | POA: Diagnosis not present

## 2017-03-27 DIAGNOSIS — M5136 Other intervertebral disc degeneration, lumbar region: Secondary | ICD-10-CM | POA: Diagnosis not present

## 2017-03-27 DIAGNOSIS — M9903 Segmental and somatic dysfunction of lumbar region: Secondary | ICD-10-CM | POA: Diagnosis not present

## 2017-03-27 DIAGNOSIS — M5032 Other cervical disc degeneration, mid-cervical region, unspecified level: Secondary | ICD-10-CM | POA: Diagnosis not present

## 2017-03-28 DIAGNOSIS — L820 Inflamed seborrheic keratosis: Secondary | ICD-10-CM | POA: Diagnosis not present

## 2017-03-28 DIAGNOSIS — B078 Other viral warts: Secondary | ICD-10-CM | POA: Diagnosis not present

## 2017-03-28 DIAGNOSIS — L570 Actinic keratosis: Secondary | ICD-10-CM | POA: Diagnosis not present

## 2017-03-28 DIAGNOSIS — Z85828 Personal history of other malignant neoplasm of skin: Secondary | ICD-10-CM | POA: Diagnosis not present

## 2017-03-31 DIAGNOSIS — M9903 Segmental and somatic dysfunction of lumbar region: Secondary | ICD-10-CM | POA: Diagnosis not present

## 2017-03-31 DIAGNOSIS — M9902 Segmental and somatic dysfunction of thoracic region: Secondary | ICD-10-CM | POA: Diagnosis not present

## 2017-03-31 DIAGNOSIS — M5032 Other cervical disc degeneration, mid-cervical region, unspecified level: Secondary | ICD-10-CM | POA: Diagnosis not present

## 2017-03-31 DIAGNOSIS — M5136 Other intervertebral disc degeneration, lumbar region: Secondary | ICD-10-CM | POA: Diagnosis not present

## 2017-03-31 DIAGNOSIS — M9901 Segmental and somatic dysfunction of cervical region: Secondary | ICD-10-CM | POA: Diagnosis not present

## 2017-03-31 DIAGNOSIS — M5415 Radiculopathy, thoracolumbar region: Secondary | ICD-10-CM | POA: Diagnosis not present

## 2017-04-02 DIAGNOSIS — M9902 Segmental and somatic dysfunction of thoracic region: Secondary | ICD-10-CM | POA: Diagnosis not present

## 2017-04-02 DIAGNOSIS — M5415 Radiculopathy, thoracolumbar region: Secondary | ICD-10-CM | POA: Diagnosis not present

## 2017-04-02 DIAGNOSIS — M9901 Segmental and somatic dysfunction of cervical region: Secondary | ICD-10-CM | POA: Diagnosis not present

## 2017-04-02 DIAGNOSIS — M5136 Other intervertebral disc degeneration, lumbar region: Secondary | ICD-10-CM | POA: Diagnosis not present

## 2017-04-02 DIAGNOSIS — M9903 Segmental and somatic dysfunction of lumbar region: Secondary | ICD-10-CM | POA: Diagnosis not present

## 2017-04-02 DIAGNOSIS — M5032 Other cervical disc degeneration, mid-cervical region, unspecified level: Secondary | ICD-10-CM | POA: Diagnosis not present

## 2017-04-04 ENCOUNTER — Other Ambulatory Visit: Payer: Self-pay | Admitting: Radiology

## 2017-04-04 DIAGNOSIS — M9903 Segmental and somatic dysfunction of lumbar region: Secondary | ICD-10-CM | POA: Diagnosis not present

## 2017-04-04 DIAGNOSIS — M9902 Segmental and somatic dysfunction of thoracic region: Secondary | ICD-10-CM | POA: Diagnosis not present

## 2017-04-04 DIAGNOSIS — M9901 Segmental and somatic dysfunction of cervical region: Secondary | ICD-10-CM | POA: Diagnosis not present

## 2017-04-04 DIAGNOSIS — M5136 Other intervertebral disc degeneration, lumbar region: Secondary | ICD-10-CM | POA: Diagnosis not present

## 2017-04-04 DIAGNOSIS — M5032 Other cervical disc degeneration, mid-cervical region, unspecified level: Secondary | ICD-10-CM | POA: Diagnosis not present

## 2017-04-04 DIAGNOSIS — M5415 Radiculopathy, thoracolumbar region: Secondary | ICD-10-CM | POA: Diagnosis not present

## 2017-04-07 DIAGNOSIS — M9902 Segmental and somatic dysfunction of thoracic region: Secondary | ICD-10-CM | POA: Diagnosis not present

## 2017-04-07 DIAGNOSIS — M9901 Segmental and somatic dysfunction of cervical region: Secondary | ICD-10-CM | POA: Diagnosis not present

## 2017-04-07 DIAGNOSIS — M5415 Radiculopathy, thoracolumbar region: Secondary | ICD-10-CM | POA: Diagnosis not present

## 2017-04-07 DIAGNOSIS — M9903 Segmental and somatic dysfunction of lumbar region: Secondary | ICD-10-CM | POA: Diagnosis not present

## 2017-04-07 DIAGNOSIS — M5136 Other intervertebral disc degeneration, lumbar region: Secondary | ICD-10-CM | POA: Diagnosis not present

## 2017-04-07 DIAGNOSIS — M5032 Other cervical disc degeneration, mid-cervical region, unspecified level: Secondary | ICD-10-CM | POA: Diagnosis not present

## 2017-04-08 ENCOUNTER — Ambulatory Visit (HOSPITAL_COMMUNITY): Payer: Medicare Other

## 2017-04-08 ENCOUNTER — Other Ambulatory Visit: Payer: Self-pay | Admitting: Student

## 2017-04-09 ENCOUNTER — Encounter (HOSPITAL_COMMUNITY): Payer: Self-pay

## 2017-04-09 ENCOUNTER — Ambulatory Visit (HOSPITAL_COMMUNITY)
Admission: RE | Admit: 2017-04-09 | Discharge: 2017-04-09 | Disposition: A | Discharge status: Home / Self Care | Payer: Medicare Other | Source: Ambulatory Visit | Attending: Gastroenterology | Admitting: Gastroenterology

## 2017-04-09 DIAGNOSIS — Z7982 Long term (current) use of aspirin: Secondary | ICD-10-CM | POA: Insufficient documentation

## 2017-04-09 DIAGNOSIS — Z961 Presence of intraocular lens: Secondary | ICD-10-CM | POA: Diagnosis not present

## 2017-04-09 DIAGNOSIS — I2510 Atherosclerotic heart disease of native coronary artery without angina pectoris: Secondary | ICD-10-CM | POA: Diagnosis not present

## 2017-04-09 DIAGNOSIS — Z79899 Other long term (current) drug therapy: Secondary | ICD-10-CM | POA: Diagnosis not present

## 2017-04-09 DIAGNOSIS — K740 Hepatic fibrosis: Secondary | ICD-10-CM | POA: Diagnosis not present

## 2017-04-09 DIAGNOSIS — I10 Essential (primary) hypertension: Secondary | ICD-10-CM | POA: Diagnosis not present

## 2017-04-09 DIAGNOSIS — M1990 Unspecified osteoarthritis, unspecified site: Secondary | ICD-10-CM | POA: Insufficient documentation

## 2017-04-09 DIAGNOSIS — H409 Unspecified glaucoma: Secondary | ICD-10-CM | POA: Diagnosis not present

## 2017-04-09 DIAGNOSIS — R748 Abnormal levels of other serum enzymes: Secondary | ICD-10-CM | POA: Diagnosis not present

## 2017-04-09 DIAGNOSIS — R7989 Other specified abnormal findings of blood chemistry: Secondary | ICD-10-CM

## 2017-04-09 DIAGNOSIS — Z882 Allergy status to sulfonamides status: Secondary | ICD-10-CM | POA: Diagnosis not present

## 2017-04-09 DIAGNOSIS — R932 Abnormal findings on diagnostic imaging of liver and biliary tract: Secondary | ICD-10-CM | POA: Insufficient documentation

## 2017-04-09 DIAGNOSIS — Z90710 Acquired absence of both cervix and uterus: Secondary | ICD-10-CM | POA: Insufficient documentation

## 2017-04-09 DIAGNOSIS — Z8249 Family history of ischemic heart disease and other diseases of the circulatory system: Secondary | ICD-10-CM | POA: Diagnosis not present

## 2017-04-09 DIAGNOSIS — R740 Nonspecific elevation of levels of transaminase and lactic acid dehydrogenase [LDH]: Secondary | ICD-10-CM | POA: Diagnosis not present

## 2017-04-09 DIAGNOSIS — Z791 Long term (current) use of non-steroidal anti-inflammatories (NSAID): Secondary | ICD-10-CM | POA: Insufficient documentation

## 2017-04-09 DIAGNOSIS — K7581 Nonalcoholic steatohepatitis (NASH): Secondary | ICD-10-CM | POA: Diagnosis not present

## 2017-04-09 DIAGNOSIS — E119 Type 2 diabetes mellitus without complications: Secondary | ICD-10-CM | POA: Diagnosis not present

## 2017-04-09 DIAGNOSIS — Z800 Family history of malignant neoplasm of digestive organs: Secondary | ICD-10-CM | POA: Diagnosis not present

## 2017-04-09 DIAGNOSIS — K219 Gastro-esophageal reflux disease without esophagitis: Secondary | ICD-10-CM | POA: Diagnosis not present

## 2017-04-09 DIAGNOSIS — E7800 Pure hypercholesterolemia, unspecified: Secondary | ICD-10-CM | POA: Diagnosis not present

## 2017-04-09 DIAGNOSIS — Z9841 Cataract extraction status, right eye: Secondary | ICD-10-CM | POA: Insufficient documentation

## 2017-04-09 DIAGNOSIS — R945 Abnormal results of liver function studies: Secondary | ICD-10-CM

## 2017-04-09 DIAGNOSIS — F419 Anxiety disorder, unspecified: Secondary | ICD-10-CM | POA: Insufficient documentation

## 2017-04-09 LAB — CBC
HEMATOCRIT: 40.3 % (ref 36.0–46.0)
Hemoglobin: 13.5 g/dL (ref 12.0–15.0)
MCH: 31 pg (ref 26.0–34.0)
MCHC: 33.5 g/dL (ref 30.0–36.0)
MCV: 92.6 fL (ref 78.0–100.0)
Platelets: 213 10*3/uL (ref 150–400)
RBC: 4.35 MIL/uL (ref 3.87–5.11)
RDW: 13.8 % (ref 11.5–15.5)
WBC: 8.5 10*3/uL (ref 4.0–10.5)

## 2017-04-09 LAB — PROTIME-INR
INR: 1.01
Prothrombin Time: 13.2 seconds (ref 11.4–15.2)

## 2017-04-09 LAB — GLUCOSE, CAPILLARY: GLUCOSE-CAPILLARY: 144 mg/dL — AB (ref 65–99)

## 2017-04-09 MED ORDER — MIDAZOLAM HCL 2 MG/2ML IJ SOLN
INTRAMUSCULAR | Status: AC | PRN
  Administered 2017-04-09: 0.5 mg via INTRAVENOUS
  Administered 2017-04-09: 1 mg via INTRAVENOUS

## 2017-04-09 MED ORDER — LIDOCAINE HCL (PF) 1 % IJ SOLN
INTRAMUSCULAR | Status: AC
  Filled 2017-04-09: qty 10

## 2017-04-09 MED ORDER — FENTANYL CITRATE (PF) 100 MCG/2ML IJ SOLN
INTRAMUSCULAR | Status: AC | PRN
  Administered 2017-04-09: 50 ug via INTRAVENOUS

## 2017-04-09 MED ORDER — MIDAZOLAM HCL 2 MG/2ML IJ SOLN
INTRAMUSCULAR | Status: AC
  Filled 2017-04-09: qty 4

## 2017-04-09 MED ORDER — SODIUM CHLORIDE 0.9 % IV SOLN: INTRAVENOUS | Status: DC

## 2017-04-09 MED ORDER — FENTANYL CITRATE (PF) 100 MCG/2ML IJ SOLN
INTRAMUSCULAR | Status: AC
  Filled 2017-04-09: qty 4

## 2017-04-09 MED ORDER — GELATIN ABSORBABLE 12-7 MM EX MISC
CUTANEOUS | Status: AC
  Filled 2017-04-09: qty 1

## 2017-04-09 NOTE — Discharge Instructions (Addendum)
Liver Biopsy, Care After Refer to this sheet in the next few weeks. These instructions provide you with information on caring for yourself after your procedure. Your health care provider may also give you more specific instructions. Your treatment has been planned according to current medical practices, but problems sometimes occur. Call your health care provider if you have any problems or questions after your procedure. What can I expect after the procedure? After your procedure, it is typical to have the following:  A small amount of discomfort in the area where the biopsy was done and in the right shoulder or shoulder blade.  A small amount of bruising around the area where the biopsy was done and on the skin over the liver.  Sleepiness and fatigue for the rest of the day.  Follow these instructions at home:  Rest at home for 1-2 days or as directed by your health care provider.  Have a friend or family member stay with you for at least 24 hours.  Because of the medicines used during the procedure, you should not do the following things in the first 24 hours: ? Drive. ? Use machinery. ? Be responsible for the care of other people. ? Sign legal documents. ? Take a bath or shower.  There are many different ways to close and cover an incision, including stitches, skin glue, and adhesive strips. Follow your health care provider's instructions on: ? Incision care. ? Bandage (dressing) changes and removal. ? Incision closure removal.  Do not drink alcohol in the first week.  Do not lift more than 5 pounds or play contact sports for 2 weeks after this test.  Take medicines only as directed by your health care provider. Do not take medicine containing aspirin or non-steroidal anti-inflammatory medicines such as ibuprofen for 1 week after this test.  It is your responsibility to get your test results. Contact a health care provider if:  You have increased bleeding from an incision  that results in more than a small spot of blood.  You have redness, swelling, or increasing pain in any incisions.  You notice a discharge or a bad smell coming from any of your incisions.  You have a fever or chills. Get help right away if:  You develop swelling, bloating, or pain in your abdomen.  You become dizzy or faint.  You develop a rash.  You are nauseous or vomit.  You have difficulty breathing, feel short of breath, or feel faint.  You develop chest pain.  You have problems with your speech or vision.  You have trouble balancing or moving your arms or legs. This information is not intended to replace advice given to you by your health care provider. Make sure you discuss any questions you have with your health care provider. Document Released: 08/10/2004 Document Revised: 06/29/2015 Document Reviewed: 03/19/2013 Elsevier Interactive Patient Education  2018 [place]. Moderate Conscious Sedation, Adult, Care After These instructions provide you with information about caring for yourself after your procedure. Your health care provider may also give you more specific instructions. Your treatment has been planned according to current medical practices, but problems sometimes occur. Call your health care provider if you have any problems or questions after your procedure. What can I expect after the procedure? After your procedure, it is common:  To feel sleepy for several hours.  To feel clumsy and have poor balance for several hours.  To have poor judgment for several hours.  To vomit if you eat  too soon.  Follow these instructions at home: For at least 24 hours after the procedure:   Do not: ? Participate in activities where you could fall or become injured. ? Drive. ? Use heavy machinery. ? Drink alcohol. ? Take sleeping pills or medicines that cause drowsiness. ? Make important decisions or sign legal documents. ? Take care of children on your  own.  Rest. Eating and drinking  Follow the diet recommended by your health care provider.  If you vomit: ? Drink water, juice, or soup when you can drink without vomiting. ? Make sure you have little or no nausea before eating solid foods. General instructions  Have a responsible adult stay with you until you are awake and alert.  Take over-the-counter and prescription medicines only as told by your health care provider.  If you smoke, do not smoke without supervision.  Keep all follow-up visits as told by your health care provider. This is important. Contact a health care provider if:  You keep feeling nauseous or you keep vomiting.  You feel light-headed.  You develop a rash.  You have a fever. Get help right away if:  You have trouble breathing. This information is not intended to replace advice given to you by your health care provider. Make sure you discuss any questions you have with your health care provider. Document Released: 11/11/2012 Document Revised: 06/26/2015 Document Reviewed: 05/13/2015 Elsevier Interactive Patient Education  [employer].

## 2017-04-09 NOTE — H&P (Signed)
Chief Complaint: Autoimmune versus fatty liver disease  Referring Physician(s): Magod,Marc  Supervising Physician: Corrie Mckusick  Patient Status: [facility] - Out-pt  History of Present Illness: Tammy Pineda is a 72 y.o. female who is here today for a random liver biopsy.  She is followed by Dr. Watt Climes for abnormal finding of the biliary tract and abnormal AST and ALT.  There is a question of autoimmune versus fatty liver disease.  She is NPO. She does not take blood thinners.  She feels well today. She denies recent fever/chills or other illness.  Past Medical History:  Diagnosis Date  . Anxiety   . Arthritis   . Coronary artery disease    nonobstructive by cardiac catheterization 2011 with a 30% LAD lesion  . Diabetes ([place])    Type 2  . GERD (gastroesophageal reflux disease)   . Glaucoma   . Hypercholesteremia   . Hypertension   . PVC (premature ventricular contraction)   . Seasonal allergies     Past Surgical History:  Procedure Laterality Date  . APPENDECTOMY  1959  . BUNIONECTOMY    . CARDIAC CATHETERIZATION  2011   clean cath  . CARDIOVASCULAR STRESS TEST    . CATARACT EXTRACTION W/ INTRAOCULAR LENS IMPLANT Right 2010  . KNEE ARTHROSCOPY Left   . NASAL RECONSTRUCTION  1976  . SHOULDER ARTHROSCOPY Left   . TOTAL ABDOMINAL HYSTERECTOMY  1976    Allergies: Sulfamethoxazole-trimethoprim and Tape  Medications: Prior to Admission medications   Medication Sig Start Date End Date Taking? Authorizing Provider  aspirin EC 81 MG tablet Take 81 mg by mouth every evening.   Yes [provider]  brimonidine (ALPHAGAN) 0.2 % ophthalmic solution Place 1 drop into both eyes 2 (two) times daily.   Yes [provider]  cephALEXin (KEFLEX) 250 MG capsule Take 250 mg by mouth daily.   Yes [provider]  Cholecalciferol (VITAMIN D) 2000 UNITS CAPS Take 2,000 Units by mouth at bedtime.    Yes [provider]  CRESTOR 10 MG tablet  Take 5 mg by mouth every evening.  09/22/13  Yes [provider]  diphenhydrAMINE (BENADRYL) 25 MG tablet Take 25 mg by mouth at bedtime.   Yes [provider]  escitalopram (LEXAPRO) 10 MG tablet Take 10 mg by mouth at bedtime.  06/24/14  Yes [provider]  fluticasone (FLONASE) 50 MCG/ACT nasal spray Place 1 spray into both nostrils as needed for allergies.    Yes [provider]  HYDROcodone-Acetaminophen 5-300 MG TABS Take 0.5 mg by mouth 2 (two) times daily as needed (pain).   Yes [provider]  ibuprofen (ADVIL,MOTRIN) 200 MG tablet Take 400-800 mg by mouth daily as needed for moderate pain.   Yes [provider]  latanoprost (XALATAN) 0.005 % ophthalmic solution Place 1 drop into both eyes at bedtime.   Yes [provider]  LORazepam (ATIVAN) 1 MG tablet Take 0.5 mg by mouth daily as needed for anxiety.    Yes [provider]  losartan-hydrochlorothiazide (HYZAAR) 50-12.5 MG per tablet Take 1 tablet by mouth every evening.   Yes [provider]  meloxicam (MOBIC) 15 MG tablet Take 15 mg by mouth every other day. At night   Yes [provider]  Methylcellulose, Laxative, (CITRUCEL) 500 MG TABS Take 500 mg by mouth at bedtime.    Yes [provider]  Misc Natural Products (TURMERIC CURCUMIN) CAPS Take 1 capsule by mouth daily.   Yes [provider]  pantoprazole (PROTONIX) 40 MG tablet Take 40 mg by mouth daily as needed (acid reflux).   Yes [provider]  Probiotic CAPS Take 1 capsule by mouth daily.   Yes [provider]  timolol (BETIMOL) 0.5 % ophthalmic solution Place 1 drop into both eyes daily.   Yes [provider]     Family History  Problem Relation Age of Onset  . Heart disease Father        open heart surgery at 58  . Colon cancer Father   . Heart disease Mother        open heart surgery at age 68  . Hypertension Mother   . Heart attack  Maternal Grandfather   . Stroke Paternal Grandmother   . Heart block Brother     Social History   Socioeconomic History  . Marital status: Married    Spouse name: Not on file  . Number of children: Not on file  . Years of education: Not on file  . Highest education level: Not on file  Social Needs  . Financial resource strain: Not on file  . Food insecurity - worry: Not on file  . Food insecurity - inability: Not on file  . Transportation needs - medical: Not on file  . Transportation needs - non-medical: Not on file  Occupational History  . Occupation: Programmer, multimedia: [employer]    Comment: [place]  Tobacco Use  . Smoking status: Never Smoker  Substance and Sexual Activity  . Alcohol use: No  . Drug use: No  . Sexual activity: Not on file  Other Topics Concern  . 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  All other systems are negative. Review of Systems  Vital Signs: BP (!) 130/57   Pulse 67   Temp 97.7 F (36.5 C) (Oral)   Ht 5' 6"  (1.676 m)   Wt 180 lb (81.6 kg)   SpO2 98%   BMI 29.05 kg/m   Physical Exam  Constitutional: She is oriented to person, place, and time. She appears well-developed.  HENT:  Head: Normocephalic and atraumatic.  Eyes: EOM are normal.  Neck: Normal range of motion.  Cardiovascular: Normal rate, regular rhythm and normal heart sounds.  Pulmonary/Chest: Effort normal and breath sounds normal. No stridor. No respiratory distress.  Abdominal: Soft.  Musculoskeletal: Normal range of motion.  Neurological: She is alert and oriented to person, place, and time.  Skin: Skin is warm and dry.  Psychiatric: She has a normal mood and affect. Her behavior is normal. Judgment and thought content normal.  Vitals reviewed.   Imaging: Mm Screening Breast Tomo Bilateral  Result Date:  03/17/2017 CLINICAL DATA:  Screening. EXAM: DIGITAL SCREENING BILATERAL MAMMOGRAM WITH TOMO AND CAD COMPARISON:  Previous exam(s). ACR Breast Density Category b: There are scattered areas of fibroglandular density. FINDINGS: There are no findings suspicious for malignancy. Images were processed with CAD. IMPRESSION: No mammographic evidence of malignancy. A result letter of this screening mammogram will be mailed directly to the patient. RECOMMENDATION: Screening mammogram in one year. (Code:SM-B-01Y) BI-RADS CATEGORY  1: Negative. Electronically Signed   By: Dorise Bullion III M.D   On: 03/17/2017 08:00    Labs:  CBC: No results for input(s): WBC, HGB, HCT, PLT in the last  8760 hours.  COAGS: No results for input(s): INR, APTT in the last 8760 hours.  BMP: No results for input(s): NA, K, CL, CO2, GLUCOSE, BUN, CALCIUM, CREATININE, GFRNONAA, GFRAA in the last 8760 hours.  Invalid input(s): CMP  LIVER FUNCTION TESTS: No results for input(s): BILITOT, AST, ALT, ALKPHOS, PROT, ALBUMIN in the last 8760 hours.  TUMOR MARKERS: No results for input(s): AFPTM, CEA, CA199, CHROMGRNA in the last 8760 hours.  Assessment and Plan:  Abnormal liver function tests.  Will proceed with random liver biopsy today by Dr. Earleen Newport.  Risks and benefits discussed with the patient including, but not limited to bleeding, infection, damage to adjacent structures or low yield requiring additional tests.  All of the patient's questions were answered, patient is agreeable to proceed. Consent signed and in chart.  Thank you for this interesting consult.  I greatly enjoyed meeting Tammy Pineda and look forward to participating in their care.  A copy of this report was sent to the requesting provider on this date.  Electronically Signed: Murrell Redden, PA-C 04/09/2017, 11:51 AM   I spent a total of  30 Minutes in face to face in clinical consultation, greater than 50% of which was counseling/coordinating  care for liver biopsy.

## 2017-04-09 NOTE — Procedures (Signed)
Interventional Radiology Procedure Note  Procedure: US guided liver biospy, medical liver, for transaminitis  Complications: None Recommendations:  - Ok to shower tomorrow - Do not submerge for 7 days - Routine care  - 2 hour observation  Signed,  Dulcy Fanny. Earleen Newport, DO

## 2017-04-17 ENCOUNTER — Other Ambulatory Visit: Payer: Self-pay | Admitting: Ophthalmology

## 2017-04-17 DIAGNOSIS — L720 Epidermal cyst: Secondary | ICD-10-CM | POA: Diagnosis not present

## 2017-04-17 DIAGNOSIS — Z961 Presence of intraocular lens: Secondary | ICD-10-CM | POA: Diagnosis not present

## 2017-04-17 DIAGNOSIS — H401233 Low-tension glaucoma, bilateral, severe stage: Secondary | ICD-10-CM | POA: Diagnosis not present

## 2017-04-18 DIAGNOSIS — E119 Type 2 diabetes mellitus without complications: Secondary | ICD-10-CM | POA: Diagnosis not present

## 2017-04-18 DIAGNOSIS — H6590 Unspecified nonsuppurative otitis media, unspecified ear: Secondary | ICD-10-CM | POA: Diagnosis not present

## 2017-04-18 DIAGNOSIS — R7989 Other specified abnormal findings of blood chemistry: Secondary | ICD-10-CM | POA: Diagnosis not present

## 2017-04-18 DIAGNOSIS — E1165 Type 2 diabetes mellitus with hyperglycemia: Secondary | ICD-10-CM | POA: Diagnosis not present

## 2017-04-22 DIAGNOSIS — K7581 Nonalcoholic steatohepatitis (NASH): Secondary | ICD-10-CM | POA: Diagnosis not present

## 2017-04-22 DIAGNOSIS — R5383 Other fatigue: Secondary | ICD-10-CM | POA: Diagnosis not present

## 2017-04-22 DIAGNOSIS — K740 Hepatic fibrosis: Secondary | ICD-10-CM | POA: Diagnosis not present

## 2017-04-22 DIAGNOSIS — E1165 Type 2 diabetes mellitus with hyperglycemia: Secondary | ICD-10-CM | POA: Diagnosis not present

## 2017-04-22 DIAGNOSIS — R748 Abnormal levels of other serum enzymes: Secondary | ICD-10-CM | POA: Diagnosis not present

## 2017-04-25 DIAGNOSIS — M9901 Segmental and somatic dysfunction of cervical region: Secondary | ICD-10-CM | POA: Diagnosis not present

## 2017-04-25 DIAGNOSIS — M5415 Radiculopathy, thoracolumbar region: Secondary | ICD-10-CM | POA: Diagnosis not present

## 2017-04-25 DIAGNOSIS — M9902 Segmental and somatic dysfunction of thoracic region: Secondary | ICD-10-CM | POA: Diagnosis not present

## 2017-04-25 DIAGNOSIS — M9903 Segmental and somatic dysfunction of lumbar region: Secondary | ICD-10-CM | POA: Diagnosis not present

## 2017-04-25 DIAGNOSIS — M5136 Other intervertebral disc degeneration, lumbar region: Secondary | ICD-10-CM | POA: Diagnosis not present

## 2017-04-25 DIAGNOSIS — M5032 Other cervical disc degeneration, mid-cervical region, unspecified level: Secondary | ICD-10-CM | POA: Diagnosis not present

## 2017-05-09 DIAGNOSIS — B078 Other viral warts: Secondary | ICD-10-CM | POA: Diagnosis not present

## 2017-05-09 DIAGNOSIS — Z85828 Personal history of other malignant neoplasm of skin: Secondary | ICD-10-CM | POA: Diagnosis not present

## 2017-05-26 DIAGNOSIS — J029 Acute pharyngitis, unspecified: Secondary | ICD-10-CM | POA: Diagnosis not present

## 2017-05-26 DIAGNOSIS — K219 Gastro-esophageal reflux disease without esophagitis: Secondary | ICD-10-CM | POA: Diagnosis not present

## 2017-05-27 DIAGNOSIS — M0609 Rheumatoid arthritis without rheumatoid factor, multiple sites: Secondary | ICD-10-CM | POA: Diagnosis not present

## 2017-06-03 DIAGNOSIS — H401233 Low-tension glaucoma, bilateral, severe stage: Secondary | ICD-10-CM | POA: Diagnosis not present

## 2017-06-03 DIAGNOSIS — Z961 Presence of intraocular lens: Secondary | ICD-10-CM | POA: Diagnosis not present

## 2017-06-09 DIAGNOSIS — H401222 Low-tension glaucoma, left eye, moderate stage: Secondary | ICD-10-CM | POA: Diagnosis not present

## 2017-06-09 DIAGNOSIS — H401213 Low-tension glaucoma, right eye, severe stage: Secondary | ICD-10-CM | POA: Diagnosis not present

## 2017-06-09 DIAGNOSIS — Z961 Presence of intraocular lens: Secondary | ICD-10-CM | POA: Diagnosis not present

## 2017-06-10 DIAGNOSIS — M150 Primary generalized (osteo)arthritis: Secondary | ICD-10-CM | POA: Diagnosis not present

## 2017-06-10 DIAGNOSIS — K760 Fatty (change of) liver, not elsewhere classified: Secondary | ICD-10-CM | POA: Diagnosis not present

## 2017-06-10 DIAGNOSIS — E663 Overweight: Secondary | ICD-10-CM | POA: Diagnosis not present

## 2017-06-10 DIAGNOSIS — R7989 Other specified abnormal findings of blood chemistry: Secondary | ICD-10-CM | POA: Diagnosis not present

## 2017-06-10 DIAGNOSIS — M0609 Rheumatoid arthritis without rheumatoid factor, multiple sites: Secondary | ICD-10-CM | POA: Diagnosis not present

## 2017-06-10 DIAGNOSIS — Z6828 Body mass index (BMI) 28.0-28.9, adult: Secondary | ICD-10-CM | POA: Diagnosis not present

## 2017-06-10 DIAGNOSIS — Z79899 Other long term (current) drug therapy: Secondary | ICD-10-CM | POA: Diagnosis not present

## 2017-06-10 DIAGNOSIS — M2550 Pain in unspecified joint: Secondary | ICD-10-CM | POA: Diagnosis not present

## 2017-06-18 DIAGNOSIS — M79671 Pain in right foot: Secondary | ICD-10-CM | POA: Diagnosis not present

## 2017-06-18 DIAGNOSIS — M7741 Metatarsalgia, right foot: Secondary | ICD-10-CM | POA: Diagnosis not present

## 2017-07-16 DIAGNOSIS — Z961 Presence of intraocular lens: Secondary | ICD-10-CM | POA: Diagnosis not present

## 2017-07-16 DIAGNOSIS — R350 Frequency of micturition: Secondary | ICD-10-CM | POA: Diagnosis not present

## 2017-07-16 DIAGNOSIS — N390 Urinary tract infection, site not specified: Secondary | ICD-10-CM | POA: Diagnosis not present

## 2017-07-18 DIAGNOSIS — F419 Anxiety disorder, unspecified: Secondary | ICD-10-CM | POA: Diagnosis not present

## 2017-07-18 DIAGNOSIS — K219 Gastro-esophageal reflux disease without esophagitis: Secondary | ICD-10-CM | POA: Diagnosis not present

## 2017-07-18 DIAGNOSIS — E559 Vitamin D deficiency, unspecified: Secondary | ICD-10-CM | POA: Diagnosis not present

## 2017-07-18 DIAGNOSIS — E119 Type 2 diabetes mellitus without complications: Secondary | ICD-10-CM | POA: Diagnosis not present

## 2017-07-18 DIAGNOSIS — N183 Chronic kidney disease, stage 3 (moderate): Secondary | ICD-10-CM | POA: Diagnosis not present

## 2017-07-18 DIAGNOSIS — E1122 Type 2 diabetes mellitus with diabetic chronic kidney disease: Secondary | ICD-10-CM | POA: Diagnosis not present

## 2017-07-18 DIAGNOSIS — R2689 Other abnormalities of gait and mobility: Secondary | ICD-10-CM | POA: Diagnosis not present

## 2017-07-18 DIAGNOSIS — E7800 Pure hypercholesterolemia, unspecified: Secondary | ICD-10-CM | POA: Diagnosis not present

## 2017-07-18 DIAGNOSIS — R609 Edema, unspecified: Secondary | ICD-10-CM | POA: Diagnosis not present

## 2017-07-18 DIAGNOSIS — L299 Pruritus, unspecified: Secondary | ICD-10-CM | POA: Diagnosis not present

## 2017-07-18 DIAGNOSIS — I10 Essential (primary) hypertension: Secondary | ICD-10-CM | POA: Diagnosis not present

## 2017-07-21 DIAGNOSIS — K219 Gastro-esophageal reflux disease without esophagitis: Secondary | ICD-10-CM | POA: Diagnosis not present

## 2017-07-21 DIAGNOSIS — I10 Essential (primary) hypertension: Secondary | ICD-10-CM | POA: Diagnosis not present

## 2017-07-21 DIAGNOSIS — E7800 Pure hypercholesterolemia, unspecified: Secondary | ICD-10-CM | POA: Diagnosis not present

## 2017-07-21 DIAGNOSIS — E559 Vitamin D deficiency, unspecified: Secondary | ICD-10-CM | POA: Diagnosis not present

## 2017-07-21 DIAGNOSIS — R2689 Other abnormalities of gait and mobility: Secondary | ICD-10-CM | POA: Diagnosis not present

## 2017-07-21 DIAGNOSIS — E1122 Type 2 diabetes mellitus with diabetic chronic kidney disease: Secondary | ICD-10-CM | POA: Diagnosis not present

## 2017-07-21 DIAGNOSIS — L299 Pruritus, unspecified: Secondary | ICD-10-CM | POA: Diagnosis not present

## 2017-07-21 DIAGNOSIS — F419 Anxiety disorder, unspecified: Secondary | ICD-10-CM | POA: Diagnosis not present

## 2017-07-21 DIAGNOSIS — E119 Type 2 diabetes mellitus without complications: Secondary | ICD-10-CM | POA: Diagnosis not present

## 2017-07-21 DIAGNOSIS — N183 Chronic kidney disease, stage 3 (moderate): Secondary | ICD-10-CM | POA: Diagnosis not present

## 2017-07-23 DIAGNOSIS — N907 Vulvar cyst: Secondary | ICD-10-CM | POA: Diagnosis not present

## 2017-07-23 DIAGNOSIS — Z8742 Personal history of other diseases of the female genital tract: Secondary | ICD-10-CM | POA: Diagnosis not present

## 2017-07-23 DIAGNOSIS — Z87440 Personal history of urinary (tract) infections: Secondary | ICD-10-CM | POA: Diagnosis not present

## 2017-07-23 DIAGNOSIS — N952 Postmenopausal atrophic vaginitis: Secondary | ICD-10-CM | POA: Diagnosis not present

## 2017-07-28 DIAGNOSIS — R262 Difficulty in walking, not elsewhere classified: Secondary | ICD-10-CM | POA: Diagnosis not present

## 2017-07-28 DIAGNOSIS — R2689 Other abnormalities of gait and mobility: Secondary | ICD-10-CM | POA: Diagnosis not present

## 2017-07-29 DIAGNOSIS — M0609 Rheumatoid arthritis without rheumatoid factor, multiple sites: Secondary | ICD-10-CM | POA: Diagnosis not present

## 2017-07-30 DIAGNOSIS — R262 Difficulty in walking, not elsewhere classified: Secondary | ICD-10-CM | POA: Diagnosis not present

## 2017-07-30 DIAGNOSIS — R2689 Other abnormalities of gait and mobility: Secondary | ICD-10-CM | POA: Diagnosis not present

## 2017-08-04 DIAGNOSIS — R262 Difficulty in walking, not elsewhere classified: Secondary | ICD-10-CM | POA: Diagnosis not present

## 2017-08-04 DIAGNOSIS — R2689 Other abnormalities of gait and mobility: Secondary | ICD-10-CM | POA: Diagnosis not present

## 2017-08-06 DIAGNOSIS — R2689 Other abnormalities of gait and mobility: Secondary | ICD-10-CM | POA: Diagnosis not present

## 2017-08-06 DIAGNOSIS — R262 Difficulty in walking, not elsewhere classified: Secondary | ICD-10-CM | POA: Diagnosis not present

## 2017-08-12 DIAGNOSIS — R262 Difficulty in walking, not elsewhere classified: Secondary | ICD-10-CM | POA: Diagnosis not present

## 2017-08-12 DIAGNOSIS — R2689 Other abnormalities of gait and mobility: Secondary | ICD-10-CM | POA: Diagnosis not present

## 2017-08-14 DIAGNOSIS — R262 Difficulty in walking, not elsewhere classified: Secondary | ICD-10-CM | POA: Diagnosis not present

## 2017-08-14 DIAGNOSIS — R2689 Other abnormalities of gait and mobility: Secondary | ICD-10-CM | POA: Diagnosis not present

## 2017-08-19 DIAGNOSIS — R2689 Other abnormalities of gait and mobility: Secondary | ICD-10-CM | POA: Diagnosis not present

## 2017-08-19 DIAGNOSIS — R262 Difficulty in walking, not elsewhere classified: Secondary | ICD-10-CM | POA: Diagnosis not present

## 2017-08-21 DIAGNOSIS — R2689 Other abnormalities of gait and mobility: Secondary | ICD-10-CM | POA: Diagnosis not present

## 2017-08-21 DIAGNOSIS — R262 Difficulty in walking, not elsewhere classified: Secondary | ICD-10-CM | POA: Diagnosis not present

## 2017-08-25 ENCOUNTER — Other Ambulatory Visit: Payer: Self-pay | Admitting: Gastroenterology

## 2017-08-25 DIAGNOSIS — Z800 Family history of malignant neoplasm of digestive organs: Secondary | ICD-10-CM | POA: Diagnosis not present

## 2017-08-25 DIAGNOSIS — R1011 Right upper quadrant pain: Secondary | ICD-10-CM

## 2017-08-25 DIAGNOSIS — R932 Abnormal findings on diagnostic imaging of liver and biliary tract: Secondary | ICD-10-CM | POA: Diagnosis not present

## 2017-08-25 DIAGNOSIS — K219 Gastro-esophageal reflux disease without esophagitis: Secondary | ICD-10-CM | POA: Diagnosis not present

## 2017-08-29 ENCOUNTER — Ambulatory Visit
Admission: RE | Admit: 2017-08-29 | Discharge: 2017-08-29 | Disposition: A | Discharge status: Home / Self Care | Payer: Medicare Other | Source: Ambulatory Visit | Attending: Gastroenterology | Admitting: Gastroenterology

## 2017-08-29 ENCOUNTER — Encounter: Payer: Self-pay | Admitting: Radiology

## 2017-08-29 DIAGNOSIS — N2889 Other specified disorders of kidney and ureter: Secondary | ICD-10-CM | POA: Diagnosis not present

## 2017-08-29 DIAGNOSIS — R1011 Right upper quadrant pain: Secondary | ICD-10-CM

## 2017-08-29 MED ORDER — IOPAMIDOL (ISOVUE-370) INJECTION 76%
100.0000 mL | Freq: Once | INTRAVENOUS | Status: AC | PRN
  Administered 2017-08-29: 100 mL via INTRAVENOUS

## 2017-09-05 ENCOUNTER — Other Ambulatory Visit: Payer: Self-pay | Admitting: Gastroenterology

## 2017-09-05 DIAGNOSIS — N289 Disorder of kidney and ureter, unspecified: Secondary | ICD-10-CM

## 2017-09-10 DIAGNOSIS — Z800 Family history of malignant neoplasm of digestive organs: Secondary | ICD-10-CM | POA: Diagnosis not present

## 2017-09-10 DIAGNOSIS — K5730 Diverticulosis of large intestine without perforation or abscess without bleeding: Secondary | ICD-10-CM | POA: Diagnosis not present

## 2017-09-10 DIAGNOSIS — D126 Benign neoplasm of colon, unspecified: Secondary | ICD-10-CM | POA: Diagnosis not present

## 2017-09-10 DIAGNOSIS — Z86010 Personal history of colonic polyps: Secondary | ICD-10-CM | POA: Diagnosis not present

## 2017-09-12 DIAGNOSIS — D126 Benign neoplasm of colon, unspecified: Secondary | ICD-10-CM | POA: Diagnosis not present

## 2017-09-16 ENCOUNTER — Other Ambulatory Visit (HOSPITAL_COMMUNITY): Payer: Self-pay | Admitting: Gastroenterology

## 2017-09-16 DIAGNOSIS — R1011 Right upper quadrant pain: Secondary | ICD-10-CM

## 2017-09-17 DIAGNOSIS — R300 Dysuria: Secondary | ICD-10-CM | POA: Diagnosis not present

## 2017-09-17 DIAGNOSIS — B373 Candidiasis of vulva and vagina: Secondary | ICD-10-CM | POA: Diagnosis not present

## 2017-09-17 DIAGNOSIS — N3000 Acute cystitis without hematuria: Secondary | ICD-10-CM | POA: Diagnosis not present

## 2017-09-19 ENCOUNTER — Ambulatory Visit (HOSPITAL_COMMUNITY): Payer: Medicare Other

## 2017-09-30 DIAGNOSIS — M0609 Rheumatoid arthritis without rheumatoid factor, multiple sites: Secondary | ICD-10-CM | POA: Diagnosis not present

## 2017-09-30 DIAGNOSIS — Z79899 Other long term (current) drug therapy: Secondary | ICD-10-CM | POA: Diagnosis not present

## 2017-10-02 ENCOUNTER — Encounter (HOSPITAL_COMMUNITY)
Admission: RE | Admit: 2017-10-02 | Discharge: 2017-10-02 | Disposition: A | Discharge status: Home / Self Care | Payer: Medicare Other | Source: Ambulatory Visit | Attending: Gastroenterology | Admitting: Gastroenterology

## 2017-10-02 DIAGNOSIS — R1011 Right upper quadrant pain: Secondary | ICD-10-CM | POA: Insufficient documentation

## 2017-10-02 MED ORDER — TECHNETIUM TC 99M MEBROFENIN IV KIT
5.5000 | PACK | Freq: Once | INTRAVENOUS | Status: AC | PRN
  Administered 2017-10-02: 5.5 via INTRAVENOUS

## 2017-10-14 DIAGNOSIS — H401213 Low-tension glaucoma, right eye, severe stage: Secondary | ICD-10-CM | POA: Diagnosis not present

## 2017-10-14 DIAGNOSIS — H401222 Low-tension glaucoma, left eye, moderate stage: Secondary | ICD-10-CM | POA: Diagnosis not present

## 2017-10-14 DIAGNOSIS — H04123 Dry eye syndrome of bilateral lacrimal glands: Secondary | ICD-10-CM | POA: Diagnosis not present

## 2017-10-20 ENCOUNTER — Ambulatory Visit (INDEPENDENT_AMBULATORY_CARE_PROVIDER_SITE_OTHER): Payer: Medicare Other | Admitting: Primary Care

## 2017-10-20 ENCOUNTER — Encounter: Payer: Self-pay | Admitting: Primary Care

## 2017-10-20 VITALS — BP 120/72 | HR 70 | Ht 66.0 in | Wt 174.0 lb

## 2017-10-20 DIAGNOSIS — N760 Acute vaginitis: Secondary | ICD-10-CM | POA: Diagnosis not present

## 2017-10-20 DIAGNOSIS — G4733 Obstructive sleep apnea (adult) (pediatric): Secondary | ICD-10-CM | POA: Diagnosis not present

## 2017-10-20 DIAGNOSIS — L293 Anogenital pruritus, unspecified: Secondary | ICD-10-CM | POA: Diagnosis not present

## 2017-10-20 NOTE — Patient Instructions (Addendum)
Check CPAP.com for travel machines (not covered by insurance)  Check with DME company if due for new cpap machine. Has had current machine since 2014  Make sure to wear cpap every night. Goal 4-6 hours or more Do not drive if experiencing excessive daytime fatigue or somnolence   FU in 1 year with Dr. Annamaria Boots

## 2017-10-20 NOTE — Progress Notes (Signed)
@Patient  ID: Tammy Pineda, female    DOB: 1945-05-11, 72 y.o.   MRN: [id number]  Chief Complaint  Patient presents with  . Follow-up    2 year follow up CPAP, wear daily, even when napping    Referring provider: Leighton Ruff, MD  HPI: 72 year old female, never smoked. PMH OSA. Patient of Dr. Annamaria Boots, last seen October 2017. Home sleep study in 2014, AHI 29.8.   10/20/2017 Patient presents today for cpap follow-up. Doing well, no issues. Wearing every night, 100% compliance. Auto-titrate 8 to 15cm H20. Uses nasal mask. Has original machines. Uses [facility], gets new supplies every 3 months. Travels to [place] [employer] frequency and asking if there is a travel cpap machine she could use.  No issues. Reports benefit with use.  Air-view Download: 100% compliance, 30/30 days >4hours Pressure 8-15cm H20 Minimal leaks AHI 2.1     Allergies  Allergen Reactions  . Sulfamethoxazole-Trimethoprim Rash  . Tape Rash    Immunization History  Administered Date(s) Administered  . Influenza Split 11/04/2013  . Influenza Whole 11/05/2011, 11/03/2012  . Influenza-Unspecified 12/05/2013, 11/06/2015  . Pneumococcal Polysaccharide-23 11/07/2014  . Pneumococcal-Unspecified 02/05/2011  . Td 07/03/2005  . Zoster 02/05/2011    Past Medical History:  Diagnosis Date  . Anxiety   . Arthritis   . Coronary artery disease    nonobstructive by cardiac catheterization 2011 with a 30% LAD lesion  . Diabetes ([place])    Type 2  . GERD (gastroesophageal reflux disease)   . Glaucoma   . Hypercholesteremia   . Hypertension   . PVC (premature ventricular contraction)   . Seasonal allergies     Tobacco History: Social History   Tobacco Use  Smoking Status Never Smoker  Smokeless Tobacco Never Used   Counseling given: Not Answered   Outpatient Medications Prior to Visit  Medication Sig Dispense Refill  . aspirin EC 81 MG tablet Take 81 mg by mouth every evening.    . brimonidine (ALPHAGAN)  0.2 % ophthalmic solution Place 1 drop into both eyes 2 (two) times daily.    . cephALEXin (KEFLEX) 250 MG capsule Take 250 mg by mouth daily.    . Cholecalciferol (VITAMIN D) 2000 UNITS CAPS Take 2,000 Units by mouth at bedtime.     . CRESTOR 10 MG tablet Take 5 mg by mouth every evening.     . escitalopram (LEXAPRO) 10 MG tablet Take 10 mg by mouth at bedtime.     Marland Kitchen HYDROcodone-Acetaminophen 5-300 MG TABS Take 0.5 mg by mouth 2 (two) times daily as needed (pain).    Marland Kitchen ibuprofen (ADVIL,MOTRIN) 200 MG tablet Take 400-800 mg by mouth daily as needed for moderate pain.    Marland Kitchen latanoprost (XALATAN) 0.005 % ophthalmic solution Place 1 drop into both eyes at bedtime.    Marland Kitchen LORazepam (ATIVAN) 1 MG tablet Take 0.5 mg by mouth daily as needed for anxiety.     Marland Kitchen losartan-hydrochlorothiazide (HYZAAR) 50-12.5 MG per tablet Take 1 tablet by mouth every evening.    . meloxicam (MOBIC) 15 MG tablet Take 15 mg by mouth every other day. At night    . Methylcellulose, Laxative, (CITRUCEL) 500 MG TABS Take 500 mg by mouth at bedtime.     . Misc Natural Products (TURMERIC CURCUMIN) CAPS Take 1 capsule by mouth daily.    . pantoprazole (PROTONIX) 40 MG tablet Take 40 mg by mouth daily as needed (acid reflux).    . Probiotic CAPS Take 1 capsule  by mouth daily.    . timolol (BETIMOL) 0.5 % ophthalmic solution Place 1 drop into both eyes daily.    . diphenhydrAMINE (BENADRYL) 25 MG tablet Take 25 mg by mouth at bedtime.    . fluticasone (FLONASE) 50 MCG/ACT nasal spray Place 1 spray into both nostrils as needed for allergies.      No facility-administered medications prior to visit.     Review of Systems  Review of Systems  Constitutional: Negative.   HENT: Negative.   Respiratory: Negative.   Cardiovascular: Negative.     Physical Exam  BP 120/72 (BP Location: Right Arm, Patient Position: Sitting, Cuff Size: Normal)   Pulse 70   Ht 5' 6"  (1.676 m)   Wt 174 lb (78.9 kg)   SpO2 96%   BMI 28.08 kg/m    Physical Exam  Constitutional: She is oriented to person, place, and time. She appears well-developed and well-nourished.  HENT:  Head: Normocephalic and atraumatic.  Eyes: Pupils are equal, round, and reactive to light. EOM are normal.  Neck: Normal range of motion. Neck supple.  Cardiovascular: Normal rate, regular rhythm and normal heart sounds.  No murmur heard. Pulmonary/Chest: Effort normal and breath sounds normal. No respiratory distress. She has no wheezes.  Abdominal: Soft. Bowel sounds are normal. There is no tenderness.  Neurological: She is alert and oriented to person, place, and time.  Skin: Skin is warm and dry. No rash noted. No erythema.  Psychiatric: She has a normal mood and affect. Her behavior is normal. Judgment normal.     Lab Results:  CBC    Component Value Date/Time   WBC 8.5 04/09/2017 1137   RBC 4.35 04/09/2017 1137   HGB 13.5 04/09/2017 1137   HCT 40.3 04/09/2017 1137   PLT 213 04/09/2017 1137   MCV 92.6 04/09/2017 1137   MCH 31.0 04/09/2017 1137   MCHC 33.5 04/09/2017 1137   RDW 13.8 04/09/2017 1137   LYMPHSABS 1.8 11/30/2009 0903   MONOABS 0.5 11/30/2009 0903   EOSABS 0.1 11/30/2009 0903   BASOSABS 0.0 11/30/2009 0903    BMET    Component Value Date/Time   NA 142 02/09/2012 1644   K 3.6 02/09/2012 1644   CL 105 02/09/2012 1644   CO2 28 02/09/2012 1644   GLUCOSE 141 (H) 02/09/2012 1644   BUN 18 02/09/2012 1644   CREATININE 0.89 02/09/2012 1644   CALCIUM 9.5 02/09/2012 1644   GFRNONAA 65 (L) 02/09/2012 1644   GFRAA 75 (L) 02/09/2012 1644    BNP No results found for: BNP  ProBNP No results found for: PROBNP  Imaging: Nm Hepato W/eject Fract  Result Date: 10/02/2017 CLINICAL DATA:  RIGHT upper quadrant pain for 1 year EXAM: NUCLEAR MEDICINE HEPATOBILIARY IMAGING WITH GALLBLADDER EF TECHNIQUE: Sequential images of the abdomen were obtained out to 60 minutes following intravenous administration of radiopharmaceutical. After oral  ingestion of Ensure, gallbladder ejection fraction was determined. At 60 min, normal ejection fraction is greater than 33%. RADIOPHARMACEUTICALS:  5.5 mCi Tc-28m Choletec IV COMPARISON:  None FINDINGS: Normal tracer extraction from bloodstream indicating normal hepatocellular function. Normal excretion of tracer into biliary tree. Gallbladder visualized at 18 min. Small bowel visualized at 12 min. No hepatic retention of tracer. Subjectively normal emptying of tracer from gallbladder following fatty meal stimulation. Calculated gallbladder ejection fraction is 67%, normal. Patient reported no symptoms following Ensure ingestion. Normal gallbladder ejection fraction following Ensure ingestion is greater than 33% at 1 hour. IMPRESSION: Normal exam. Electronically Signed  By: Lavonia Dana M.D.   On: 10/02/2017 16:24     Assessment & Plan:   Obstructive sleep apnea 100% compliance, auto titrate setting 8-15cm H20. AHI 2.1   Needs new machine, >14 years old Discuss with patient that she could check on [email or web address] for travel machine Do not drive if experiencing excessive daytime fatigue or somnolence  FU in 1 year with Dr. Kaleen Mask, NP 10/20/2017

## 2017-10-20 NOTE — Assessment & Plan Note (Signed)
100% compliance, auto titrate setting 8-15cm H20. AHI 2.1   Needs new machine, >73 years old Discuss with patient that she could check on [email or web address] for travel machine Do not drive if experiencing excessive daytime fatigue or somnolence  FU in 1 year with Dr. Annamaria Boots

## 2017-10-21 DIAGNOSIS — E663 Overweight: Secondary | ICD-10-CM | POA: Diagnosis not present

## 2017-10-21 DIAGNOSIS — K760 Fatty (change of) liver, not elsewhere classified: Secondary | ICD-10-CM | POA: Diagnosis not present

## 2017-10-21 DIAGNOSIS — M0609 Rheumatoid arthritis without rheumatoid factor, multiple sites: Secondary | ICD-10-CM | POA: Diagnosis not present

## 2017-10-21 DIAGNOSIS — M2550 Pain in unspecified joint: Secondary | ICD-10-CM | POA: Diagnosis not present

## 2017-10-21 DIAGNOSIS — Z6828 Body mass index (BMI) 28.0-28.9, adult: Secondary | ICD-10-CM | POA: Diagnosis not present

## 2017-10-21 DIAGNOSIS — Z79899 Other long term (current) drug therapy: Secondary | ICD-10-CM | POA: Diagnosis not present

## 2017-10-21 DIAGNOSIS — R7989 Other specified abnormal findings of blood chemistry: Secondary | ICD-10-CM | POA: Diagnosis not present

## 2017-10-21 DIAGNOSIS — M150 Primary generalized (osteo)arthritis: Secondary | ICD-10-CM | POA: Diagnosis not present

## 2017-10-29 DIAGNOSIS — R748 Abnormal levels of other serum enzymes: Secondary | ICD-10-CM | POA: Diagnosis not present

## 2017-10-29 DIAGNOSIS — K7581 Nonalcoholic steatohepatitis (NASH): Secondary | ICD-10-CM | POA: Diagnosis not present

## 2017-10-29 DIAGNOSIS — K740 Hepatic fibrosis: Secondary | ICD-10-CM | POA: Diagnosis not present

## 2017-10-29 DIAGNOSIS — E119 Type 2 diabetes mellitus without complications: Secondary | ICD-10-CM | POA: Diagnosis not present

## 2017-11-05 DIAGNOSIS — M170 Bilateral primary osteoarthritis of knee: Secondary | ICD-10-CM | POA: Diagnosis not present

## 2017-11-05 DIAGNOSIS — M1712 Unilateral primary osteoarthritis, left knee: Secondary | ICD-10-CM | POA: Diagnosis not present

## 2017-11-12 DIAGNOSIS — M1712 Unilateral primary osteoarthritis, left knee: Secondary | ICD-10-CM | POA: Diagnosis not present

## 2017-11-19 DIAGNOSIS — M1712 Unilateral primary osteoarthritis, left knee: Secondary | ICD-10-CM | POA: Diagnosis not present

## 2017-11-19 DIAGNOSIS — Z23 Encounter for immunization: Secondary | ICD-10-CM | POA: Diagnosis not present

## 2017-11-25 DIAGNOSIS — M0609 Rheumatoid arthritis without rheumatoid factor, multiple sites: Secondary | ICD-10-CM | POA: Diagnosis not present

## 2017-12-02 DIAGNOSIS — R1011 Right upper quadrant pain: Secondary | ICD-10-CM | POA: Diagnosis not present

## 2017-12-02 DIAGNOSIS — K7581 Nonalcoholic steatohepatitis (NASH): Secondary | ICD-10-CM | POA: Diagnosis not present

## 2017-12-02 DIAGNOSIS — R748 Abnormal levels of other serum enzymes: Secondary | ICD-10-CM | POA: Diagnosis not present

## 2017-12-02 DIAGNOSIS — N289 Disorder of kidney and ureter, unspecified: Secondary | ICD-10-CM | POA: Diagnosis not present

## 2017-12-02 DIAGNOSIS — K740 Hepatic fibrosis: Secondary | ICD-10-CM | POA: Diagnosis not present

## 2017-12-02 DIAGNOSIS — K769 Liver disease, unspecified: Secondary | ICD-10-CM | POA: Diagnosis not present

## 2017-12-09 ENCOUNTER — Other Ambulatory Visit: Payer: Self-pay | Admitting: Family Medicine

## 2017-12-09 DIAGNOSIS — E2839 Other primary ovarian failure: Secondary | ICD-10-CM

## 2017-12-23 DIAGNOSIS — R300 Dysuria: Secondary | ICD-10-CM | POA: Diagnosis not present

## 2017-12-23 DIAGNOSIS — R0981 Nasal congestion: Secondary | ICD-10-CM | POA: Diagnosis not present

## 2017-12-25 ENCOUNTER — Ambulatory Visit
Admission: RE | Admit: 2017-12-25 | Discharge: 2017-12-25 | Disposition: A | Discharge status: Home / Self Care | Payer: Medicare Other | Source: Ambulatory Visit | Attending: Family Medicine | Admitting: Family Medicine

## 2017-12-25 DIAGNOSIS — E2839 Other primary ovarian failure: Secondary | ICD-10-CM

## 2017-12-25 DIAGNOSIS — M8589 Other specified disorders of bone density and structure, multiple sites: Secondary | ICD-10-CM | POA: Diagnosis not present

## 2017-12-25 DIAGNOSIS — Z780 Asymptomatic menopausal state: Secondary | ICD-10-CM | POA: Diagnosis not present

## 2018-01-15 DIAGNOSIS — M1712 Unilateral primary osteoarthritis, left knee: Secondary | ICD-10-CM | POA: Diagnosis not present

## 2018-01-15 DIAGNOSIS — M25562 Pain in left knee: Secondary | ICD-10-CM | POA: Diagnosis not present

## 2018-01-20 DIAGNOSIS — M0609 Rheumatoid arthritis without rheumatoid factor, multiple sites: Secondary | ICD-10-CM | POA: Diagnosis not present

## 2018-01-21 DIAGNOSIS — N952 Postmenopausal atrophic vaginitis: Secondary | ICD-10-CM | POA: Diagnosis not present

## 2018-01-21 DIAGNOSIS — N907 Vulvar cyst: Secondary | ICD-10-CM | POA: Diagnosis not present

## 2018-01-21 DIAGNOSIS — N390 Urinary tract infection, site not specified: Secondary | ICD-10-CM | POA: Diagnosis not present

## 2018-01-21 DIAGNOSIS — Z09 Encounter for follow-up examination after completed treatment for conditions other than malignant neoplasm: Secondary | ICD-10-CM | POA: Diagnosis not present

## 2018-02-12 DIAGNOSIS — R0781 Pleurodynia: Secondary | ICD-10-CM | POA: Diagnosis not present

## 2018-02-12 DIAGNOSIS — R079 Chest pain, unspecified: Secondary | ICD-10-CM | POA: Diagnosis not present

## 2018-02-24 DIAGNOSIS — Z961 Presence of intraocular lens: Secondary | ICD-10-CM | POA: Diagnosis not present

## 2018-02-24 DIAGNOSIS — H04123 Dry eye syndrome of bilateral lacrimal glands: Secondary | ICD-10-CM | POA: Diagnosis not present

## 2018-02-24 DIAGNOSIS — H401213 Low-tension glaucoma, right eye, severe stage: Secondary | ICD-10-CM | POA: Diagnosis not present

## 2018-02-24 DIAGNOSIS — H401222 Low-tension glaucoma, left eye, moderate stage: Secondary | ICD-10-CM | POA: Diagnosis not present

## 2018-02-26 DIAGNOSIS — R300 Dysuria: Secondary | ICD-10-CM | POA: Diagnosis not present

## 2018-02-26 DIAGNOSIS — E7800 Pure hypercholesterolemia, unspecified: Secondary | ICD-10-CM | POA: Diagnosis not present

## 2018-02-26 DIAGNOSIS — N183 Chronic kidney disease, stage 3 (moderate): Secondary | ICD-10-CM | POA: Diagnosis not present

## 2018-02-26 DIAGNOSIS — E559 Vitamin D deficiency, unspecified: Secondary | ICD-10-CM | POA: Diagnosis not present

## 2018-02-26 DIAGNOSIS — E1122 Type 2 diabetes mellitus with diabetic chronic kidney disease: Secondary | ICD-10-CM | POA: Diagnosis not present

## 2018-02-27 DIAGNOSIS — J0100 Acute maxillary sinusitis, unspecified: Secondary | ICD-10-CM | POA: Diagnosis not present

## 2018-02-27 DIAGNOSIS — R05 Cough: Secondary | ICD-10-CM | POA: Diagnosis not present

## 2018-03-09 DIAGNOSIS — J019 Acute sinusitis, unspecified: Secondary | ICD-10-CM | POA: Diagnosis not present

## 2018-03-26 DIAGNOSIS — M0609 Rheumatoid arthritis without rheumatoid factor, multiple sites: Secondary | ICD-10-CM | POA: Diagnosis not present

## 2018-03-26 DIAGNOSIS — Z79899 Other long term (current) drug therapy: Secondary | ICD-10-CM | POA: Diagnosis not present

## 2018-04-07 DIAGNOSIS — R197 Diarrhea, unspecified: Secondary | ICD-10-CM | POA: Diagnosis not present

## 2018-04-07 DIAGNOSIS — K7581 Nonalcoholic steatohepatitis (NASH): Secondary | ICD-10-CM | POA: Diagnosis not present

## 2018-04-07 DIAGNOSIS — Z7984 Long term (current) use of oral hypoglycemic drugs: Secondary | ICD-10-CM | POA: Diagnosis not present

## 2018-04-07 DIAGNOSIS — E1122 Type 2 diabetes mellitus with diabetic chronic kidney disease: Secondary | ICD-10-CM | POA: Diagnosis not present

## 2018-04-15 DIAGNOSIS — K7581 Nonalcoholic steatohepatitis (NASH): Secondary | ICD-10-CM | POA: Diagnosis not present

## 2018-04-15 DIAGNOSIS — K740 Hepatic fibrosis: Secondary | ICD-10-CM | POA: Diagnosis not present

## 2018-04-15 DIAGNOSIS — K7689 Other specified diseases of liver: Secondary | ICD-10-CM | POA: Diagnosis not present

## 2018-04-15 DIAGNOSIS — N281 Cyst of kidney, acquired: Secondary | ICD-10-CM | POA: Diagnosis not present

## 2018-04-15 DIAGNOSIS — K769 Liver disease, unspecified: Secondary | ICD-10-CM | POA: Diagnosis not present

## 2018-04-15 DIAGNOSIS — R748 Abnormal levels of other serum enzymes: Secondary | ICD-10-CM | POA: Diagnosis not present

## 2018-04-15 DIAGNOSIS — K760 Fatty (change of) liver, not elsewhere classified: Secondary | ICD-10-CM | POA: Diagnosis not present

## 2018-04-17 DIAGNOSIS — R197 Diarrhea, unspecified: Secondary | ICD-10-CM | POA: Diagnosis not present

## 2018-04-21 DIAGNOSIS — Z79899 Other long term (current) drug therapy: Secondary | ICD-10-CM | POA: Diagnosis not present

## 2018-04-21 DIAGNOSIS — E663 Overweight: Secondary | ICD-10-CM | POA: Diagnosis not present

## 2018-04-21 DIAGNOSIS — K760 Fatty (change of) liver, not elsewhere classified: Secondary | ICD-10-CM | POA: Diagnosis not present

## 2018-04-21 DIAGNOSIS — Z6826 Body mass index (BMI) 26.0-26.9, adult: Secondary | ICD-10-CM | POA: Diagnosis not present

## 2018-04-21 DIAGNOSIS — M150 Primary generalized (osteo)arthritis: Secondary | ICD-10-CM | POA: Diagnosis not present

## 2018-04-21 DIAGNOSIS — M2550 Pain in unspecified joint: Secondary | ICD-10-CM | POA: Diagnosis not present

## 2018-04-21 DIAGNOSIS — M0609 Rheumatoid arthritis without rheumatoid factor, multiple sites: Secondary | ICD-10-CM | POA: Diagnosis not present

## 2018-04-27 ENCOUNTER — Other Ambulatory Visit: Payer: Self-pay

## 2018-04-27 ENCOUNTER — Encounter: Payer: Self-pay | Admitting: Internal Medicine

## 2018-04-27 ENCOUNTER — Ambulatory Visit (INDEPENDENT_AMBULATORY_CARE_PROVIDER_SITE_OTHER): Payer: Medicare Other | Admitting: Internal Medicine

## 2018-04-27 VITALS — BP 130/58 | HR 75 | Temp 97.7°F | Ht 66.0 in | Wt 161.0 lb

## 2018-04-27 DIAGNOSIS — E1165 Type 2 diabetes mellitus with hyperglycemia: Secondary | ICD-10-CM | POA: Diagnosis not present

## 2018-04-27 MED ORDER — METFORMIN HCL ER 500 MG PO TB24: 1000.0000 mg | ORAL_TABLET | Freq: Two times a day (BID) | ORAL | 3 refills | Status: DC

## 2018-04-27 NOTE — Progress Notes (Signed)
Patient ID: Tammy Pineda, female   DOB: [birth year], 73 y.o.   MRN: [id number]   HPI: Tammy Pineda is a 74 y.o.-year-old female, referred by her PCP, Dr. Drema Dallas, for management of DM2, dx in "several years ago", non-insulin-dependent, uncontrolled, with complications (CAD, CKD stage III, NASH). Tammy Pineda, her husband, is also my pt.  Reviewed latest HbA1c level: 02/26/2018: HbA1c 6.5% 09/20/2017: HbA1c 7.3% 04/18/2017: HbA1c 7.6% No results found for: HGBA1C  Pt is on a regimen of: - Metformin 1000 mg 2x a day, with meals -added 11/2017 - significant diarrhea with the am dose.  Pt checks her sugars 1x a day and they are: - am: 110-138 - 2h after b'fast: n/c - before lunch: n/c - 2h after lunch: n/c - before dinner: n/c - 2h after dinner: n/c - bedtime: n/c - nighttime: n/c Lowest sugar was 110; ? hypoglycemia awareness. Highest sugar was 150s.  Glucometer: One Touch variable  Pt's meals are: - Breakfast: eggs, cereal + fruit, egg waffles and sugar-free syrup - Lunch: salad, grilled chicken - Dinner: baked meat, veggies, starch  - Snacks: 1-2x - at bedtime - granola  She exercises at [employer] and also dances 2-3x a week.  She lost ~20 lbs in last year - partly intentional.  -+ CKD stage III, last BUN/creatinine:  02/26/2018: 23/0.94 Lab Results  Component Value Date   BUN 18 02/09/2012   BUN 25 (H) 11/30/2009   CREATININE 0.89 02/09/2012   CREATININE 1.0 11/30/2009  On losartan 50.  -+ HL; last set of lipids: Lab Results  Component Value Date   CHOL 143 12/22/2009   HDL 39.80 12/22/2009   LDLCALC 70 12/22/2009   TRIG 165.0 (H) 12/22/2009   CHOLHDL 4 12/22/2009  On Crestor 5.  - last eye exam was in 02/2018. No DR. + glaucoma. Dr. Katy Fitch. Had cataract sx.  - no numbness and tingling in her feet.  Pt has FH of DM in father and brother.   She also has a history of HTN-on Lasix as needed, IBS with diarrhea, GERD, NASH with stage 2 fibrosis- in a  study, vaginal prolapse - h/o surgery.  She is on Remicade for inflammatory arthritis.  ROS: Constitutional: no weight gain, + weight loss, no fatigue, no subjective hyperthermia, no subjective hypothermia, + nocturia Eyes: no blurry vision, no xerophthalmia ENT: + sore throat, no nodules palpated in neck, no dysphagia, no odynophagia, no hoarseness, + tinnitus, no hypoacusis Cardiovascular: no CP, no SOB, no palpitations, no leg swelling Respiratory: + cough, no SOB, no wheezing Gastrointestinal: no N, + V, + D, + C, + acid reflux Musculoskeletal: no muscle, + joint aches Skin: no rash, no hair loss Neurological: no tremors, no numbness or tingling/no dizziness/+ HAs Psychiatric: no depression, + anxiety  Past Medical History:  Diagnosis Date  . Anxiety   . Arthritis   . Coronary artery disease    nonobstructive by cardiac catheterization 2011 with a 30% LAD lesion  . Diabetes ([place])    Type 2  . GERD (gastroesophageal reflux disease)   . Glaucoma   . Hypercholesteremia   . Hypertension   . PVC (premature ventricular contraction)   . Seasonal allergies    Past Surgical History:  Procedure Laterality Date  . APPENDECTOMY  1959  . BUNIONECTOMY    . CARDIAC CATHETERIZATION  2011   clean cath  . CARDIOVASCULAR STRESS TEST    . CATARACT EXTRACTION W/ INTRAOCULAR LENS IMPLANT Right 2010  . KNEE ARTHROSCOPY  Left   . NASAL RECONSTRUCTION  1976  . SHOULDER ARTHROSCOPY Left   . TOTAL ABDOMINAL HYSTERECTOMY  1976   Social History   Socioeconomic History  . Marital status: Married    Spouse name: Not on file  . Number of children: 1  . Years of education: Not on file  . Highest education level: Not on file  Occupational History  . Occupation: Therapist, sports - retired    Fish farm manager: [employer]     Comment: [place]  Tobacco Use  . Smoking status: Never Smoker  . Smokeless tobacco: Never Used  Substance and Sexual Activity  . Alcohol use: No  . Drug use: No   Current  Outpatient Medications on File Prior to Visit  Medication Sig Dispense Refill  . brimonidine (ALPHAGAN) 0.2 % ophthalmic solution Place 1 drop into both eyes 2 (two) times daily.    . Cholecalciferol (VITAMIN D) 2000 UNITS CAPS Take 2,000 Units by mouth at bedtime.     . CRESTOR 10 MG tablet Take 5 mg by mouth every evening.     . escitalopram (LEXAPRO) 10 MG tablet Take 10 mg by mouth at bedtime.     Marland Kitchen HYDROcodone-Acetaminophen 5-300 MG TABS Take 0.5 mg by mouth 2 (two) times daily as needed (pain).    Marland Kitchen ibuprofen (ADVIL,MOTRIN) 200 MG tablet Take 400-800 mg by mouth daily as needed for moderate pain.    Marland Kitchen latanoprost (XALATAN) 0.005 % ophthalmic solution Place 1 drop into both eyes at bedtime.    Marland Kitchen LORazepam (ATIVAN) 1 MG tablet Take 0.5 mg by mouth daily as needed for anxiety.     Marland Kitchen losartan-hydrochlorothiazide (HYZAAR) 50-12.5 MG per tablet Take 1 tablet by mouth every evening.    . Methylcellulose, Laxative, (CITRUCEL) 500 MG TABS Take 500 mg by mouth at bedtime.     . Misc Natural Products (TURMERIC CURCUMIN) CAPS Take 1 capsule by mouth daily.    . Probiotic CAPS Take 1 capsule by mouth daily.    . timolol (BETIMOL) 0.5 % ophthalmic solution Place 1 drop into both eyes daily.    Marland Kitchen aspirin EC 81 MG tablet Take 81 mg by mouth every evening.     No current facility-administered medications on file prior to visit.    Also, Metformin 1000 mg 2x a day.  Allergies  Allergen Reactions  . Sulfamethoxazole-Trimethoprim Rash  . Tape Rash   Family History  Problem Relation Age of Onset  . Heart disease Father        open heart surgery at 65  . Colon cancer Father   . Heart disease Mother        open heart surgery at age 59  . Hypertension Mother   . Heart attack Maternal Grandfather   . Stroke Paternal Grandmother   . Heart block Brother      PE: BP (!) 130/58   Pulse 75   Temp 97.7 F (36.5 C)   Ht 5' 6"  (1.676 m) Comment: measured  Wt 161 lb (73 kg)   SpO2 97%   BMI 25.99  kg/m  Wt Readings from Last 3 Encounters:  04/27/18 161 lb (73 kg)  10/20/17 174 lb (78.9 kg)  10/02/17 175 lb (79.4 kg)   Constitutional: Normal weight, in NAD Eyes: PERRLA, EOMI, no exophthalmos ENT: moist mucous membranes, no thyromegaly, no cervical lymphadenopathy Cardiovascular: RRR, No MRG Respiratory: CTA B Gastrointestinal: abdomen soft, NT, ND, BS+ Musculoskeletal: + Osteoarthritic finger deformities, strength intact in all 4  Skin: moist, warm, no rashes Neurological: no tremor with outstretched hands, DTR normal in all 4  ASSESSMENT: 1. DM2, non-insulin-dependent, uncontrolled, with long-term complications - CAD - CKD stage III - NASH  PLAN:  1. Patient with long-standing, uncontrolled diabetes, on oral antidiabetic regimen with more recent improvement in control, especially after starting metformin 4 months ago.  She also started to improve her diet and she lost a significant amount of weight.   - Per review of her log, her sugars in the morning are at or slightly higher than goal, especially as she is eating high calorie snack at bedtime (granola).  I advised her to stop this and may be changed to nuts if she absolutely needs a snack, but ideally to avoid it.  - She responded very well to metformin, but she is not tolerating it very well, getting diarrhea especially after the morning dose.  We discussed about changing to metformin ER, which is better tolerated.  If she tolerates the BID dose well, I advised her to take the entire dose at dinnertime - We also discussed about other changes in her diet that will help her sugars - We discussed about the importance of checking sugars consistently and rotating check times. - I suggested to:  Patient Instructions  Please stop the regular Metformin and start Metformin ER 1000 mg 2x a day (if you tolerate this well, move the entire dose at dinnertime).  Please check sugars 1x a day, rotating checktimes.  Please return in 3  months with your sugar log.   - Strongly advised her to start checking sugars at different times of the day - check 1x a day, rotating checks - discussed about CBG targets for treatment: 80-130 mg/dL before meals and <180 mg/dL after meals; target HbA1c <7%. - given sugar log and advised how to fill it and to bring it at next appt  - given foot care handout and explained the principles  - given instructions for hypoglycemia management "15-15 rule"  - advised for yearly eye exams  - Return to clinic in 3 mo with sugar log   Philemon Kingdom, MD PhD [facility]

## 2018-04-27 NOTE — Patient Instructions (Addendum)
Please stop the regular Metformin and start Metformin ER 1000 mg 2x a day (if you tolerate this well, move the entire dose at dinnertime).  Please check sugars 1x a day, rotating checktimes.  Please return in 3 months with your sugar log.   PATIENT INSTRUCTIONS FOR TYPE 2 DIABETES:  **Please join MyChart!** - see attached instructions about how to join if you have not done so already.  DIET AND EXERCISE Diet and exercise is an important part of diabetic treatment.  We recommended aerobic exercise in the form of brisk walking (working between 40-60% of maximal aerobic capacity, similar to brisk walking) for 150 minutes per week (such as 30 minutes five days per week) along with 3 times per week performing 'resistance' training (using various gauge rubber tubes with handles) 5-10 exercises involving the major muscle groups (upper body, lower body and core) performing 10-15 repetitions (or near fatigue) each exercise. Start at half the above goal but build slowly to reach the above goals. If limited by weight, joint pain, or disability, we recommend daily walking in a swimming pool with water up to waist to reduce pressure from joints while allow for adequate exercise.    BLOOD GLUCOSES Monitoring your blood glucoses is important for continued management of your diabetes. Please check your blood glucoses 2-4 times a day: fasting, before meals and at bedtime (you can rotate these measurements - e.g. one day check before the 3 meals, the next day check before 2 of the meals and before bedtime, etc.).   HYPOGLYCEMIA (low blood sugar) Hypoglycemia is usually a reaction to not eating, exercising, or taking too much insulin/ other diabetes drugs.  Symptoms include tremors, sweating, hunger, confusion, headache, etc. Treat IMMEDIATELY with 15 grams of Carbs: . 4 glucose tablets .  cup regular juice/soda . 2 tablespoons raisins . 4 teaspoons sugar . 1 tablespoon honey Recheck blood glucose in 15 mins  and repeat above if still symptomatic/blood glucose <100.  RECOMMENDATIONS TO REDUCE YOUR RISK OF DIABETIC COMPLICATIONS: * Take your prescribed MEDICATION(S) * Follow a DIABETIC diet: Complex carbs, fiber rich foods, (monounsaturated and polyunsaturated) fats * AVOID saturated/trans fats, high fat foods, >2,300 mg salt per day. * EXERCISE at least 5 times a week for 30 minutes or preferably daily.  * DO NOT SMOKE OR DRINK more than 1 drink a day. * Check your FEET every day. Do not wear tightfitting shoes. Contact us if you develop an ulcer * See your EYE doctor once a year or more if needed * Get a FLU shot once a year * Get a PNEUMONIA vaccine once before and once after age 25 years  GOALS:  * Your Hemoglobin A1c of <7%  * fasting sugars need to be <130 * after meals sugars need to be <180 (2h after you start eating) * Your Systolic BP should be 413 or lower  * Your Diastolic BP should be 80 or lower  * Your HDL (Good Cholesterol) should be 40 or higher  * Your LDL (Bad Cholesterol) should be 100 or lower. * Your Triglycerides should be 150 or lower  * Your Urine microalbumin (kidney function) should be <30 * Your Body Mass Index should be 25 or lower    Please consider the following ways to cut down carbs and fat and increase fiber and micronutrients in your diet: - substitute whole grain for white bread or pasta - substitute brown rice for white rice - substitute 90-calorie flat bread pieces for slices of  bread when possible - substitute sweet potatoes or yams for white potatoes - substitute humus for margarine - substitute tofu for cheese when possible - substitute almond or rice milk for regular milk (would not drink soy milk daily due to concern for soy estrogen influence on breast cancer risk) - substitute dark chocolate for other sweets when possible - substitute water - can add lemon or orange slices for taste - for diet sodas (artificial sweeteners will trick your body  that you can eat sweets without getting calories and will lead you to overeating and weight gain in the long run) - do not skip breakfast or other meals (this will slow down the metabolism and will result in more weight gain over time)  - can try smoothies made from fruit and almond/rice milk in am instead of regular breakfast - can also try old-fashioned (not instant) oatmeal made with almond/rice milk in am - order the dressing on the side when eating salad at a restaurant (pour less than half of the dressing on the salad) - eat as little meat as possible - can try juicing, but should not forget that juicing will get rid of the fiber, so would alternate with eating raw veg./fruits or drinking smoothies - use as little oil as possible, even when using olive oil - can dress a salad with a mix of balsamic vinegar and lemon juice, for e.g. - use agave nectar, stevia sugar, or regular sugar rather than artificial sweateners - steam or broil/roast veggies  - snack on veggies/fruit/nuts (unsalted, preferably) when possible, rather than processed foods - reduce or eliminate aspartame in diet (it is in diet sodas, chewing gum, etc) Read the labels!  Try to read Dr. Janene Harvey book: "Program for Reversing Diabetes" for other ideas for healthy eating.

## 2018-04-29 DIAGNOSIS — L293 Anogenital pruritus, unspecified: Secondary | ICD-10-CM | POA: Diagnosis not present

## 2018-05-04 DIAGNOSIS — M1990 Unspecified osteoarthritis, unspecified site: Secondary | ICD-10-CM | POA: Diagnosis not present

## 2018-05-04 DIAGNOSIS — B029 Zoster without complications: Secondary | ICD-10-CM | POA: Diagnosis not present

## 2018-05-05 DIAGNOSIS — K7581 Nonalcoholic steatohepatitis (NASH): Secondary | ICD-10-CM | POA: Diagnosis not present

## 2018-05-07 DIAGNOSIS — B029 Zoster without complications: Secondary | ICD-10-CM | POA: Diagnosis not present

## 2018-05-28 DIAGNOSIS — B029 Zoster without complications: Secondary | ICD-10-CM | POA: Diagnosis not present

## 2018-06-18 DIAGNOSIS — B0229 Other postherpetic nervous system involvement: Secondary | ICD-10-CM | POA: Diagnosis not present

## 2018-06-25 DIAGNOSIS — H401222 Low-tension glaucoma, left eye, moderate stage: Secondary | ICD-10-CM | POA: Diagnosis not present

## 2018-06-25 DIAGNOSIS — Z961 Presence of intraocular lens: Secondary | ICD-10-CM | POA: Diagnosis not present

## 2018-06-25 DIAGNOSIS — H401213 Low-tension glaucoma, right eye, severe stage: Secondary | ICD-10-CM | POA: Diagnosis not present

## 2018-07-13 DIAGNOSIS — R300 Dysuria: Secondary | ICD-10-CM | POA: Diagnosis not present

## 2018-07-13 DIAGNOSIS — N3000 Acute cystitis without hematuria: Secondary | ICD-10-CM | POA: Diagnosis not present

## 2018-07-16 DIAGNOSIS — R5383 Other fatigue: Secondary | ICD-10-CM | POA: Diagnosis not present

## 2018-07-16 DIAGNOSIS — B0229 Other postherpetic nervous system involvement: Secondary | ICD-10-CM | POA: Diagnosis not present

## 2018-07-29 DIAGNOSIS — N390 Urinary tract infection, site not specified: Secondary | ICD-10-CM | POA: Diagnosis not present

## 2018-07-29 DIAGNOSIS — N952 Postmenopausal atrophic vaginitis: Secondary | ICD-10-CM | POA: Diagnosis not present

## 2018-07-29 DIAGNOSIS — Z9889 Other specified postprocedural states: Secondary | ICD-10-CM | POA: Diagnosis not present

## 2018-07-29 DIAGNOSIS — K580 Irritable bowel syndrome with diarrhea: Secondary | ICD-10-CM | POA: Diagnosis not present

## 2018-07-30 DIAGNOSIS — M5136 Other intervertebral disc degeneration, lumbar region: Secondary | ICD-10-CM | POA: Diagnosis not present

## 2018-07-30 DIAGNOSIS — M9902 Segmental and somatic dysfunction of thoracic region: Secondary | ICD-10-CM | POA: Diagnosis not present

## 2018-07-30 DIAGNOSIS — M9901 Segmental and somatic dysfunction of cervical region: Secondary | ICD-10-CM | POA: Diagnosis not present

## 2018-07-30 DIAGNOSIS — M9903 Segmental and somatic dysfunction of lumbar region: Secondary | ICD-10-CM | POA: Diagnosis not present

## 2018-07-30 DIAGNOSIS — M5032 Other cervical disc degeneration, mid-cervical region, unspecified level: Secondary | ICD-10-CM | POA: Diagnosis not present

## 2018-07-30 DIAGNOSIS — M5415 Radiculopathy, thoracolumbar region: Secondary | ICD-10-CM | POA: Diagnosis not present

## 2018-08-03 DIAGNOSIS — H16223 Keratoconjunctivitis sicca, not specified as Sjogren's, bilateral: Secondary | ICD-10-CM | POA: Diagnosis not present

## 2018-08-03 DIAGNOSIS — H401222 Low-tension glaucoma, left eye, moderate stage: Secondary | ICD-10-CM | POA: Diagnosis not present

## 2018-08-03 DIAGNOSIS — H0011 Chalazion right upper eyelid: Secondary | ICD-10-CM | POA: Diagnosis not present

## 2018-08-03 DIAGNOSIS — M0609 Rheumatoid arthritis without rheumatoid factor, multiple sites: Secondary | ICD-10-CM | POA: Diagnosis not present

## 2018-08-03 DIAGNOSIS — H401213 Low-tension glaucoma, right eye, severe stage: Secondary | ICD-10-CM | POA: Diagnosis not present

## 2018-08-04 ENCOUNTER — Ambulatory Visit (INDEPENDENT_AMBULATORY_CARE_PROVIDER_SITE_OTHER): Payer: Medicare Other | Admitting: Internal Medicine

## 2018-08-04 ENCOUNTER — Other Ambulatory Visit: Payer: Self-pay

## 2018-08-04 ENCOUNTER — Encounter: Payer: Self-pay | Admitting: Internal Medicine

## 2018-08-04 VITALS — BP 120/60 | HR 71 | Ht 66.0 in | Wt 160.0 lb

## 2018-08-04 DIAGNOSIS — E1165 Type 2 diabetes mellitus with hyperglycemia: Secondary | ICD-10-CM

## 2018-08-04 LAB — POCT GLYCOSYLATED HEMOGLOBIN (HGB A1C): Hemoglobin A1C: 6.2 % — AB (ref 4.0–5.6)

## 2018-08-04 NOTE — Progress Notes (Signed)
Patient ID: Tammy Pineda, female   DOB: 1945-05-26, 73 y.o.   MRN: [id number]   HPI: Tammy Pineda is a 73 y.o.-year-old female, initially referred by her PCP, Dr. Drema Dallas, returning for follow-up for DM2, dx in "several years ago", non-insulin-dependent, uncontrolled, with complications (CAD, CKD stage III, NASH). Tammy Pineda, her husband, is also my pt. last visit 3 months ago.  She had shingles on trunk in 04/2018.  Reviewed latest HbA1c level: 02/26/2018: HbA1c 6.5% 09/20/2017: HbA1c 7.3% 04/18/2017: HbA1c 7.6% No results found for: HGBA1C  Pt is on a regimen of: - Metformin 1000 mg 2x a day, with meals -added 11/2017 - had significant diarrhea >> now metformin ER 1000 mg 2x a day, which he tolerates well  Pt checks her sugars once a day: - am: 110-138 >> 110-140, 167 - 2h after b'fast: n/c >> 97-139, 170 - before lunch: n/c >> 106-121 - 2h after lunch: n/c >> 106-139, 151 - before dinner: n/c >> 85-126 - 2h after dinner: n/c >> 77-127 - bedtime: n/c - nighttime: n/c Lowest sugar was 110 >> 77; unclear at which level she has hypoglycemia awareness. Highest sugar was 150s >> 170.  Glucometer: One Touch variable  Pt's meals are: - Breakfast: eggs, cereal + fruit, egg waffles and sugar-free syrup - Lunch: salad, grilled chicken - Dinner: baked meat, veggies, starch  - Snacks: 1-2x - at bedtime - granola  At last visit she was exercising at [employer] and also dancing 2-3 times a week.  She stopped due to the coronavirus pandemic.  She lost almost 20 pounds in the year before last visit, partly intentional.  -+ CKD stage III, last BUN/creatinine:  02/26/2018: 23/0.94 Lab Results  Component Value Date   BUN 18 02/09/2012   BUN 25 (H) 11/30/2009   CREATININE 0.89 02/09/2012   CREATININE 1.0 11/30/2009  On losartan 50.  -+ HL; last set of lipids: 04/2018: will need records Lab Results  Component Value Date   CHOL 143 12/22/2009   HDL 39.80 12/22/2009   LDLCALC  70 12/22/2009   TRIG 165.0 (H) 12/22/2009   CHOLHDL 4 12/22/2009  On Crestor 5.  - last eye exam was in 02/2018: No DR, + glaucoma. Dr. Katy Fitch. Had cataract sx.  -Denies numbness and tingling in her feet.  Pt has FH of DM in father and brother.   She also has a history of HTN-on Lasix as needed, IBS with diarrhea, GERD, NASH with stage 2 fibrosis- in a study, vaginal prolapse - h/o surgery.  She is on Remicade for inflammatory arthritis.  ROS: Constitutional: no weight gain/no weight loss, no fatigue, no subjective hyperthermia, no subjective hypothermia, + nocturia Eyes: no blurry vision, no xerophthalmia ENT: no sore throat, no nodules palpated in neck, no dysphagia, no odynophagia, no hoarseness Cardiovascular: no CP/no SOB/no palpitations/no leg swelling Respiratory: no cough/no SOB/no wheezing Gastrointestinal: no N/no V/no D/no C/no acid reflux Musculoskeletal: no muscle aches/no joint aches Skin: no rashes, no hair loss Neurological: no tremors/no numbness/no tingling/no dizziness  I reviewed pt's medications, allergies, PMH, social hx, family hx, and changes were documented in the history of present illness. Otherwise, unchanged from my initial visit note.  Past Medical History:  Diagnosis Date  . Anxiety   . Arthritis   . Coronary artery disease    nonobstructive by cardiac catheterization 2011 with a 30% LAD lesion  . Diabetes ([place])    Type 2  . GERD (gastroesophageal reflux disease)   . Glaucoma   .  Hypercholesteremia   . Hypertension   . PVC (premature ventricular contraction)   . Seasonal allergies    Past Surgical History:  Procedure Laterality Date  . APPENDECTOMY  1959  . BUNIONECTOMY    . CARDIAC CATHETERIZATION  2011   clean cath  . CARDIOVASCULAR STRESS TEST    . CATARACT EXTRACTION W/ INTRAOCULAR LENS IMPLANT Right 2010  . KNEE ARTHROSCOPY Left   . NASAL RECONSTRUCTION  1976  . SHOULDER ARTHROSCOPY Left   . TOTAL ABDOMINAL HYSTERECTOMY  1976    Social History   Socioeconomic History  . Marital status: Married    Spouse name: Not on file  . Number of children: 1  . Years of education: Not on file  . Highest education level: Not on file  Occupational History  . 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 Smoking status: Never Smoker  . Smokeless tobacco: Never Used  Substance and Sexual Activity  . Alcohol use: No  . Drug use: No   Current Outpatient Medications on File Prior to Visit  Medication Sig Dispense Refill  . aspirin EC 81 MG tablet Take 81 mg by mouth every evening.    . brimonidine (ALPHAGAN) 0.2 % ophthalmic solution Place 1 drop into both eyes 2 (two) times daily.    . Cholecalciferol (VITAMIN D) 2000 UNITS CAPS Take 2,000 Units by mouth at bedtime.     . CRESTOR 10 MG tablet Take 5 mg by mouth every evening.     . escitalopram (LEXAPRO) 10 MG tablet Take 10 mg by mouth at bedtime.     Marland Kitchen HYDROcodone-Acetaminophen 5-300 MG TABS Take 0.5 mg by mouth 2 (two) times daily as needed (pain).    Marland Kitchen ibuprofen (ADVIL,MOTRIN) 200 MG tablet Take 400-800 mg by mouth daily as needed for moderate pain.    Marland Kitchen latanoprost (XALATAN) 0.005 % ophthalmic solution Place 1 drop into both eyes at bedtime.    Marland Kitchen LORazepam (ATIVAN) 1 MG tablet Take 0.5 mg by mouth daily as needed for anxiety.     Marland Kitchen losartan-hydrochlorothiazide (HYZAAR) 50-12.5 MG per tablet Take 1 tablet by mouth every evening.    . metFORMIN (GLUCOPHAGE-XR) 500 MG 24 hr tablet Take 2 tablets (1,000 mg total) by mouth 2 (two) times daily with a meal. 360 tablet 3  . Methylcellulose, Laxative, (CITRUCEL) 500 MG TABS Take 500 mg by mouth at bedtime.     . Misc Natural Products (TURMERIC CURCUMIN) CAPS Take 1 capsule by mouth daily.    . Probiotic CAPS Take 1 capsule by mouth daily.    . timolol (BETIMOL) 0.5 % ophthalmic solution Place 1 drop into both eyes daily.     No current facility-administered medications on file  prior to visit.    Also, Metformin 1000 mg 2x a day.  Allergies  Allergen Reactions  . Sulfamethoxazole-Trimethoprim Rash  . Tape Rash   Family History  Problem Relation Age of Onset  . Heart disease Father        open heart surgery at 52  . Colon cancer Father   . Heart disease Mother        open heart surgery at age 36  . Hypertension Mother   . Heart attack Maternal Grandfather   . Stroke Paternal Grandmother   . Heart block Brother     PE: BP 120/60   Pulse 71   Ht 5' 6"  (1.676 m)  Wt 160 lb (72.6 kg)   SpO2 97%   BMI 25.82 kg/m  Wt Readings from Last 3 Encounters:  08/04/18 160 lb (72.6 kg)  04/27/18 161 lb (73 kg)  10/20/17 174 lb (78.9 kg)   Constitutional: overweight, in NAD Eyes: PERRLA, EOMI, no exophthalmos ENT: moist mucous membranes, no thyromegaly, no cervical lymphadenopathy Cardiovascular: RRR, No MRG Respiratory: CTA B Gastrointestinal: abdomen soft, NT, ND, BS+ Musculoskeletal: + deformities (osteoarthritic finger changes), strength intact in all 4 Skin: moist, warm, no rashes Neurological: no tremor with outstretched hands, DTR normal in all 4  ASSESSMENT: 1. DM2, non-insulin-dependent, uncontrolled, with long-term complications - CAD - CKD stage III - NASH  PLAN:  1. Patient with longstanding, uncontrolled, type 2 diabetes, on oral antidiabetic regimen with improved control after starting metformin 4 months before last visit.  She also started to improve her diet and lost a significant amount of weight.  At that time, her sugars were at goal or slightly higher than goal in the morning despite eating a high calorie snack at bedtime (granola).  I advised her to stop this and change to nuts or other low carbohydrate snack.  At that time she was also describing diarrhea with metformin and we changed to metformin ER.  We also discussed about other changes in her diet that would help her sugars.  I advised her to check sugars daily, rotating check  times. - at this visit, she tells me she tolerates the Metformin ER w/o any problem - at this visit, sugars are at goal, mostly, with some values above target, but not many >> will not change her regimen - I suggested to:  Patient Instructions  Please continue metformin ER 1000 mg 2x a day.  Check sugars once a day, rotating check times.  Please return in 4-6 months with your sugar log.   - today, HbA1c is 6.2% (better) - continue checking sugars at different times of the day - check 1x a day, rotating checks - advised for yearly eye exams >> she is UTD - Return to clinic in 4-6 mo with sugar log   - time spent with the patient: 15 min, of which >50% was spent in obtaining information about her symptoms, reviewing her previous labs, evaluations, and treatments, counseling her about her condition (please see the discussed topics above), and developing a plan to further investigate and treat it.   Philemon Kingdom, MD PhD [facility] Endocrinology

## 2018-08-04 NOTE — Addendum Note (Signed)
Addended by: Cardell Peach I on: 08/04/2018 02:16 PM   Modules accepted: Orders

## 2018-08-04 NOTE — Patient Instructions (Addendum)
Please continue metformin ER 1000 mg 2x a day.  Check sugars once a day, rotating check times.  Please return in 4-6 months with your sugar log.

## 2018-08-11 DIAGNOSIS — Z711 Person with feared health complaint in whom no diagnosis is made: Secondary | ICD-10-CM | POA: Diagnosis not present

## 2018-08-18 DIAGNOSIS — N390 Urinary tract infection, site not specified: Secondary | ICD-10-CM | POA: Diagnosis not present

## 2018-08-18 DIAGNOSIS — R3915 Urgency of urination: Secondary | ICD-10-CM | POA: Diagnosis not present

## 2018-08-18 DIAGNOSIS — K580 Irritable bowel syndrome with diarrhea: Secondary | ICD-10-CM | POA: Diagnosis not present

## 2018-08-24 DIAGNOSIS — Z79899 Other long term (current) drug therapy: Secondary | ICD-10-CM | POA: Diagnosis not present

## 2018-08-24 DIAGNOSIS — M150 Primary generalized (osteo)arthritis: Secondary | ICD-10-CM | POA: Diagnosis not present

## 2018-08-24 DIAGNOSIS — K760 Fatty (change of) liver, not elsewhere classified: Secondary | ICD-10-CM | POA: Diagnosis not present

## 2018-08-24 DIAGNOSIS — M2550 Pain in unspecified joint: Secondary | ICD-10-CM | POA: Diagnosis not present

## 2018-08-24 DIAGNOSIS — M0609 Rheumatoid arthritis without rheumatoid factor, multiple sites: Secondary | ICD-10-CM | POA: Diagnosis not present

## 2018-08-26 DIAGNOSIS — M9902 Segmental and somatic dysfunction of thoracic region: Secondary | ICD-10-CM | POA: Diagnosis not present

## 2018-08-26 DIAGNOSIS — M5414 Radiculopathy, thoracic region: Secondary | ICD-10-CM | POA: Diagnosis not present

## 2018-08-26 DIAGNOSIS — M9901 Segmental and somatic dysfunction of cervical region: Secondary | ICD-10-CM | POA: Diagnosis not present

## 2018-08-26 DIAGNOSIS — M5032 Other cervical disc degeneration, mid-cervical region, unspecified level: Secondary | ICD-10-CM | POA: Diagnosis not present

## 2018-09-02 DIAGNOSIS — M9902 Segmental and somatic dysfunction of thoracic region: Secondary | ICD-10-CM | POA: Diagnosis not present

## 2018-09-02 DIAGNOSIS — M5414 Radiculopathy, thoracic region: Secondary | ICD-10-CM | POA: Diagnosis not present

## 2018-09-02 DIAGNOSIS — M9901 Segmental and somatic dysfunction of cervical region: Secondary | ICD-10-CM | POA: Diagnosis not present

## 2018-09-02 DIAGNOSIS — M5032 Other cervical disc degeneration, mid-cervical region, unspecified level: Secondary | ICD-10-CM | POA: Diagnosis not present

## 2018-09-08 DIAGNOSIS — M5414 Radiculopathy, thoracic region: Secondary | ICD-10-CM | POA: Diagnosis not present

## 2018-09-08 DIAGNOSIS — M5032 Other cervical disc degeneration, mid-cervical region, unspecified level: Secondary | ICD-10-CM | POA: Diagnosis not present

## 2018-09-08 DIAGNOSIS — M9901 Segmental and somatic dysfunction of cervical region: Secondary | ICD-10-CM | POA: Diagnosis not present

## 2018-09-08 DIAGNOSIS — M9902 Segmental and somatic dysfunction of thoracic region: Secondary | ICD-10-CM | POA: Diagnosis not present

## 2018-09-15 DIAGNOSIS — M0609 Rheumatoid arthritis without rheumatoid factor, multiple sites: Secondary | ICD-10-CM | POA: Diagnosis not present

## 2018-10-08 DIAGNOSIS — R1013 Epigastric pain: Secondary | ICD-10-CM | POA: Diagnosis not present

## 2018-10-08 DIAGNOSIS — K580 Irritable bowel syndrome with diarrhea: Secondary | ICD-10-CM | POA: Diagnosis not present

## 2018-10-08 DIAGNOSIS — R0989 Other specified symptoms and signs involving the circulatory and respiratory systems: Secondary | ICD-10-CM | POA: Diagnosis not present

## 2018-10-08 DIAGNOSIS — R634 Abnormal weight loss: Secondary | ICD-10-CM | POA: Diagnosis not present

## 2018-10-08 DIAGNOSIS — R031 Nonspecific low blood-pressure reading: Secondary | ICD-10-CM | POA: Diagnosis not present

## 2018-10-08 DIAGNOSIS — K7581 Nonalcoholic steatohepatitis (NASH): Secondary | ICD-10-CM | POA: Diagnosis not present

## 2018-10-08 LAB — HEMOGLOBIN A1C: Hemoglobin A1C: 5.9

## 2018-10-14 ENCOUNTER — Telehealth: Payer: Self-pay

## 2018-10-14 NOTE — Telephone Encounter (Signed)
Spoke to patient, she was started on Metformin, had GI issues so it was changed to long acting Metformin but that is now causing explosive diarrhea. Patient stopped the medication for 4 days and her sx have resolved.  Patient is asking what to take in place of it, she is wondering if she can try Glipizide?

## 2018-10-14 NOTE — Telephone Encounter (Signed)
Patient called today requesting to talk to [facility] or dr. Cruzita Lederer for a medication change

## 2018-10-14 NOTE — Telephone Encounter (Signed)
At last OV, she was taking Metformin ER 1000 mg 2x a day without any pbs! What changed/ Can she try a lower dose: 500 mg 2x a day. May need to get the Metformin ER from a different pharmacy to see if there is a problem with the new Metformin lot. Would she want to try this?

## 2018-10-14 NOTE — Telephone Encounter (Signed)
Left message for patient to return our call at [phone].

## 2018-10-16 NOTE — Telephone Encounter (Signed)
Notified patient of message from Dr. Cruzita Lederer, patient expressed understanding and agreement. No further questions.

## 2018-10-16 NOTE — Telephone Encounter (Signed)
Okay to hold off on metformin for now but definitely check sugars consistently and let us know if they increase.

## 2018-10-16 NOTE — Telephone Encounter (Signed)
Spoke to patient, she states she was seen at [facility] for a liver Bx, they checked her A1c and it was 5.9. They told her she did not need diabetes medication at this time.  Patient will like to hold off on any DM medication for now if that is ok with Dr. Cruzita Lederer but definitely will not take Metformin.

## 2018-10-19 DIAGNOSIS — W540XXA Bitten by dog, initial encounter: Secondary | ICD-10-CM | POA: Diagnosis not present

## 2018-10-19 DIAGNOSIS — S61451A Open bite of right hand, initial encounter: Secondary | ICD-10-CM | POA: Diagnosis not present

## 2018-10-22 DIAGNOSIS — R413 Other amnesia: Secondary | ICD-10-CM | POA: Diagnosis not present

## 2018-10-23 ENCOUNTER — Other Ambulatory Visit: Payer: Self-pay

## 2018-10-26 ENCOUNTER — Encounter: Payer: Self-pay | Admitting: Internal Medicine

## 2018-10-26 ENCOUNTER — Ambulatory Visit (INDEPENDENT_AMBULATORY_CARE_PROVIDER_SITE_OTHER): Payer: Medicare Other | Admitting: Internal Medicine

## 2018-10-26 ENCOUNTER — Other Ambulatory Visit: Payer: Self-pay

## 2018-10-26 VITALS — BP 130/50 | HR 82 | Ht 66.0 in | Wt 159.0 lb

## 2018-10-26 DIAGNOSIS — E1165 Type 2 diabetes mellitus with hyperglycemia: Secondary | ICD-10-CM | POA: Diagnosis not present

## 2018-10-26 NOTE — Patient Instructions (Addendum)
Please continue off medications for now.  Check sugars once a day, rotating check times.  Please return in 6 months with your meter.

## 2018-10-26 NOTE — Progress Notes (Signed)
Patient ID: Tammy Pineda, female   DOB: 1945-02-09, 73 y.o.   MRN: [id number]   HPI: Tammy Pineda is a 73 y.o.-year-old female, initially referred by her PCP, Dr. Drema Dallas, returning for follow-up for DM2, dx in "several years ago", non-insulin-dependent, uncontrolled, with complications (CAD, CKD stage III, NASH). Tammy Pineda, her husband, is also my pt. Last visit 3 mo ago.  Since last visit, patient contacted [place] with diarrhea and she stopped metformin.  Diarrhea resolved.  Reviewed latest HbA1c level: Lab Results  Component Value Date   HGBA1C 5.9 10/08/2018   HGBA1C 6.2 (A) 08/04/2018  02/26/2018: HbA1c 6.5% 09/20/2017: HbA1c 7.3% 04/18/2017: HbA1c 7.6%  Pt was on a regimen of: - Metformin 1000 mg 2x a day, with meals -added 11/2017 - had significant diarrhea >> metformin ER 1000 mg 2x a day which she was tolerating well, however, she then started to have severe diarrhea >> stopped end of 09/2018  Now off medicines.  Pt checks her sugars 1x a day: - am: 110-138 >> 110-140, 167 >>127-151 - 2h after b'fast: n/c >> 97-139, 170 >> n/c - before lunch: n/c >> 106-121 >> 119 - 2h after lunch: n/c >> 106-139, 151 >> n/c - before dinner: n/c >> 85-126 >> n/c - 2h after dinner: n/c >> 77-127 >> 96, 129 - bedtime: n/c - nighttime: n/c Lowest sugar was 110 >> 77 >> 96; ? At which level she has hypogly awareness Highest sugar was 150s >> 170 >> 156.  Glucometer: One Touch variable  Pt's meals are: - Breakfast: eggs, cereal + fruit, egg waffles and sugar-free syrup - Lunch: salad, grilled chicken - Dinner: baked meat, veggies, starch  - Snacks: 1-2x - at bedtime - granola  At last visit she was exercising at [employer] and also dancing 2-3 times a week.  Stopped 2/2 coronavs pandemic.Marland Kitchen  She lost almost 20 pounds last year.  -+ CKD stage 3;, last BUN/creatinine:  02/26/2018: 23/0.94 Lab Results  Component Value Date   BUN 18 02/09/2012   BUN 25 (H) 11/30/2009   CREATININE  0.89 02/09/2012   CREATININE 1.0 11/30/2009  On losartan 50.  -+ HL; last set of lipids: 04/2018: will need records Lab Results  Component Value Date   CHOL 143 12/22/2009   HDL 39.80 12/22/2009   LDLCALC 70 12/22/2009   TRIG 165.0 (H) 12/22/2009   CHOLHDL 4 12/22/2009  On Crestor 5.  - last eye exam was in 02/2018, + glaucoma. Dr. Katy Fitch. Had cataract sx.  - no numbness and tingling in her feet.  Pt has FH of DM in father and brother.   She also has a history of HTN-on Lasix as needed, IBS with diarrhea, GERD, NASH with stage 2 fibrosis- in a study, vaginal prolapse - h/o surgery.  She is on Remicade for inflammatory arthritis.  ROS: Constitutional: no weight gain/no weight loss, no fatigue, no subjective hyperthermia, no subjective hypothermia Eyes: no blurry vision, no xerophthalmia ENT: no sore throat, no nodules palpated in neck, no dysphagia, no odynophagia, no hoarseness Cardiovascular: no CP/no SOB/no palpitations/no leg swelling Respiratory: no cough/no SOB/no wheezing Gastrointestinal: no N/no V/no D/no C/no acid reflux Musculoskeletal: no muscle aches/no joint aches Skin: no rashes, no hair loss Neurological: no tremors/no numbness/no tingling/no dizziness  I reviewed pt's medications, allergies, PMH, social hx, family hx, and changes were documented in the history of present illness. Otherwise, unchanged from my initial visit note.  Past Medical History:  Diagnosis Date  . Anxiety   .  Arthritis   . Coronary artery disease    nonobstructive by cardiac catheterization 2011 with a 30% LAD lesion  . Diabetes ([place])    Type 2  . GERD (gastroesophageal reflux disease)   . Glaucoma   . Hypercholesteremia   . Hypertension   . PVC (premature ventricular contraction)   . Seasonal allergies    Past Surgical History:  Procedure Laterality Date  . APPENDECTOMY  1959  . BUNIONECTOMY    . CARDIAC CATHETERIZATION  2011   clean cath  . CARDIOVASCULAR STRESS TEST     . CATARACT EXTRACTION W/ INTRAOCULAR LENS IMPLANT Right 2010  . KNEE ARTHROSCOPY Left   . NASAL RECONSTRUCTION  1976  . SHOULDER ARTHROSCOPY Left   . TOTAL ABDOMINAL HYSTERECTOMY  1976   Social History   Socioeconomic History  . Marital status: Married    Spouse name: Not on file  . Number of children: 1  . Years of education: Not on file  . Highest education level: Not on file  Occupational History  . Occupation: Therapist, sports - retired    Fish farm manager: [employer]     Comment: [place]  Tobacco Use  . Smoking status: Never Smoker  . Smokeless tobacco: Never Used  Substance and Sexual Activity  . Alcohol use: No  . Drug use: No   Current Outpatient Medications on File Prior to Visit  Medication Sig Dispense Refill  . aspirin EC 81 MG tablet Take 81 mg by mouth every evening.    . brimonidine (ALPHAGAN) 0.2 % ophthalmic solution Place 1 drop into both eyes 2 (two) times daily.    . Cholecalciferol (VITAMIN D) 2000 UNITS CAPS Take 2,000 Units by mouth at bedtime.     . CRESTOR 10 MG tablet Take 5 mg by mouth every evening.     . escitalopram (LEXAPRO) 10 MG tablet Take 10 mg by mouth at bedtime.     Marland Kitchen HYDROcodone-Acetaminophen 5-300 MG TABS Take 0.5 mg by mouth 2 (two) times daily as needed (pain).    Marland Kitchen ibuprofen (ADVIL,MOTRIN) 200 MG tablet Take 400-800 mg by mouth daily as needed for moderate pain.    Marland Kitchen latanoprost (XALATAN) 0.005 % ophthalmic solution Place 1 drop into both eyes at bedtime.    Marland Kitchen LORazepam (ATIVAN) 1 MG tablet Take 0.5 mg by mouth daily as needed for anxiety.     Marland Kitchen losartan-hydrochlorothiazide (HYZAAR) 50-12.5 MG per tablet Take 1 tablet by mouth every evening.    . Methylcellulose, Laxative, (CITRUCEL) 500 MG TABS Take 500 mg by mouth at bedtime.     . Misc Natural Products (TURMERIC CURCUMIN) CAPS Take 1 capsule by mouth daily.    . Probiotic CAPS Take 1 capsule by mouth daily.    . timolol (BETIMOL) 0.5 % ophthalmic solution Place 1 drop into both eyes daily.      No current facility-administered medications on file prior to visit.    Also, Metformin 1000 mg 2x a day.  Allergies  Allergen Reactions  . Gabapentin Palpitations  . Sulfamethoxazole-Trimethoprim Rash  . Tape Rash   Family History  Problem Relation Age of Onset  . Heart disease Father        open heart surgery at 49  . Colon cancer Father   . Heart disease Mother        open heart surgery at age 10  . Hypertension Mother   . Heart attack Maternal Grandfather   . Stroke Paternal Grandmother   . Heart block  Brother     PE: BP (!) 130/50   Pulse 82   Ht 5' 6"  (1.676 m)   Wt 159 lb (72.1 kg)   SpO2 96%   BMI 25.66 kg/m  Wt Readings from Last 3 Encounters:  10/26/18 159 lb (72.1 kg)  08/04/18 160 lb (72.6 kg)  04/27/18 161 lb (73 kg)   Constitutional: overweight, in NAD Eyes: PERRLA, EOMI, no exophthalmos ENT: moist mucous membranes, no thyromegaly, no cervical lymphadenopathy Cardiovascular: RRR, No MRG Respiratory: CTA B Gastrointestinal: abdomen soft, NT, ND, BS+ Musculoskeletal: + arthritic deformities - fingers, strength intact in all 4 Skin: moist, warm, no rashes Neurological: no tremor with outstretched hands, DTR normal in all 4   ASSESSMENT: 1. DM2, non-insulin-dependent, uncontrolled, with long-term complications - CAD - CKD stage III - NASH  PLAN:  1. Patient with longstanding, previously uncontrolled type 2 diabetes, on metformin ER at last visit, which she was tolerating well.  However, afterwards, she started to get severe diarrhea and she had to stop metformin approximately 3 weeks ago.  Her diarrhea resolved.  An HbA1c obtained at the beginning of this month was excellent, at 5.9%. -At this visit, we reviewed her meter download and it appears that sugars are above target in the morning but they appear at goal later in the day.  For now, I advised her to watch her sugars carefully and let me know if they start increasing.  We may need to add  another diabetes medication, but she would not want to retry metformin. - I suggested to:  Patient Instructions  Please continue off medications for now.  Check sugars once a day, rotating check times.  Please return in 6 months with your sugar log.   - advised to check sugars at different times of the day - 1x a day, rotating check times - advised for yearly eye exams >> she is UTD - return to clinic in 6 months    - time spent with the patient: 15 min, of which >50% was spent in obtaining information about her symptoms, reviewing her previous labs, evaluations, and treatments, counseling her about her condition (please see the discussed topics above), and developing a plan to further investigate and treat it; she had a number of questions which I addressed.   Philemon Kingdom, MD PhD [facility] Endocrinology

## 2018-10-27 DIAGNOSIS — E119 Type 2 diabetes mellitus without complications: Secondary | ICD-10-CM | POA: Diagnosis not present

## 2018-10-27 DIAGNOSIS — H0011 Chalazion right upper eyelid: Secondary | ICD-10-CM | POA: Diagnosis not present

## 2018-10-27 DIAGNOSIS — H16223 Keratoconjunctivitis sicca, not specified as Sjogren's, bilateral: Secondary | ICD-10-CM | POA: Diagnosis not present

## 2018-10-27 DIAGNOSIS — Z961 Presence of intraocular lens: Secondary | ICD-10-CM | POA: Diagnosis not present

## 2018-10-27 DIAGNOSIS — H401213 Low-tension glaucoma, right eye, severe stage: Secondary | ICD-10-CM | POA: Diagnosis not present

## 2018-10-27 DIAGNOSIS — H401222 Low-tension glaucoma, left eye, moderate stage: Secondary | ICD-10-CM | POA: Diagnosis not present

## 2018-10-28 ENCOUNTER — Telehealth: Payer: Self-pay | Admitting: Internal Medicine

## 2018-10-28 NOTE — Telephone Encounter (Signed)
Note not needed 

## 2018-10-30 DIAGNOSIS — M1712 Unilateral primary osteoarthritis, left knee: Secondary | ICD-10-CM | POA: Diagnosis not present

## 2018-10-30 DIAGNOSIS — M25562 Pain in left knee: Secondary | ICD-10-CM | POA: Diagnosis not present

## 2018-11-04 DIAGNOSIS — M0609 Rheumatoid arthritis without rheumatoid factor, multiple sites: Secondary | ICD-10-CM | POA: Diagnosis not present

## 2018-11-05 DIAGNOSIS — Z85828 Personal history of other malignant neoplasm of skin: Secondary | ICD-10-CM | POA: Diagnosis not present

## 2018-11-05 DIAGNOSIS — L298 Other pruritus: Secondary | ICD-10-CM | POA: Diagnosis not present

## 2018-11-05 DIAGNOSIS — L820 Inflamed seborrheic keratosis: Secondary | ICD-10-CM | POA: Diagnosis not present

## 2018-11-05 DIAGNOSIS — B078 Other viral warts: Secondary | ICD-10-CM | POA: Diagnosis not present

## 2018-11-17 DIAGNOSIS — Z23 Encounter for immunization: Secondary | ICD-10-CM | POA: Diagnosis not present

## 2018-11-25 DIAGNOSIS — E538 Deficiency of other specified B group vitamins: Secondary | ICD-10-CM | POA: Diagnosis not present

## 2018-12-01 DIAGNOSIS — K760 Fatty (change of) liver, not elsewhere classified: Secondary | ICD-10-CM | POA: Diagnosis not present

## 2018-12-01 DIAGNOSIS — M2550 Pain in unspecified joint: Secondary | ICD-10-CM | POA: Diagnosis not present

## 2018-12-01 DIAGNOSIS — M150 Primary generalized (osteo)arthritis: Secondary | ICD-10-CM | POA: Diagnosis not present

## 2018-12-01 DIAGNOSIS — M0609 Rheumatoid arthritis without rheumatoid factor, multiple sites: Secondary | ICD-10-CM | POA: Diagnosis not present

## 2018-12-01 DIAGNOSIS — Z79899 Other long term (current) drug therapy: Secondary | ICD-10-CM | POA: Diagnosis not present

## 2018-12-10 DIAGNOSIS — B078 Other viral warts: Secondary | ICD-10-CM | POA: Diagnosis not present

## 2018-12-10 DIAGNOSIS — L820 Inflamed seborrheic keratosis: Secondary | ICD-10-CM | POA: Diagnosis not present

## 2018-12-10 DIAGNOSIS — Z85828 Personal history of other malignant neoplasm of skin: Secondary | ICD-10-CM | POA: Diagnosis not present

## 2018-12-16 DIAGNOSIS — M0609 Rheumatoid arthritis without rheumatoid factor, multiple sites: Secondary | ICD-10-CM | POA: Diagnosis not present

## 2018-12-24 DIAGNOSIS — N183 Chronic kidney disease, stage 3 unspecified: Secondary | ICD-10-CM | POA: Diagnosis not present

## 2018-12-24 DIAGNOSIS — E7800 Pure hypercholesterolemia, unspecified: Secondary | ICD-10-CM | POA: Diagnosis not present

## 2018-12-24 DIAGNOSIS — I10 Essential (primary) hypertension: Secondary | ICD-10-CM | POA: Diagnosis not present

## 2018-12-24 DIAGNOSIS — M1990 Unspecified osteoarthritis, unspecified site: Secondary | ICD-10-CM | POA: Diagnosis not present

## 2018-12-24 DIAGNOSIS — H401390 Pigmentary glaucoma, unspecified eye, stage unspecified: Secondary | ICD-10-CM | POA: Diagnosis not present

## 2018-12-24 DIAGNOSIS — E119 Type 2 diabetes mellitus without complications: Secondary | ICD-10-CM | POA: Diagnosis not present

## 2018-12-24 DIAGNOSIS — I2510 Atherosclerotic heart disease of native coronary artery without angina pectoris: Secondary | ICD-10-CM | POA: Diagnosis not present

## 2018-12-24 DIAGNOSIS — E1122 Type 2 diabetes mellitus with diabetic chronic kidney disease: Secondary | ICD-10-CM | POA: Diagnosis not present

## 2018-12-27 ENCOUNTER — Other Ambulatory Visit: Payer: Self-pay

## 2018-12-27 ENCOUNTER — Encounter (HOSPITAL_BASED_OUTPATIENT_CLINIC_OR_DEPARTMENT_OTHER): Payer: Self-pay | Admitting: Emergency Medicine

## 2018-12-27 ENCOUNTER — Emergency Department (HOSPITAL_BASED_OUTPATIENT_CLINIC_OR_DEPARTMENT_OTHER): Payer: Medicare Other

## 2018-12-27 ENCOUNTER — Emergency Department (HOSPITAL_BASED_OUTPATIENT_CLINIC_OR_DEPARTMENT_OTHER)
Admission: EM | Admit: 2018-12-27 | Discharge: 2018-12-27 | Disposition: A | Discharge status: Home / Self Care | Payer: Medicare Other | Attending: Emergency Medicine | Admitting: Emergency Medicine

## 2018-12-27 DIAGNOSIS — S0280XA Fracture of other specified skull and facial bones, unspecified side, initial encounter for closed fracture: Secondary | ICD-10-CM | POA: Diagnosis not present

## 2018-12-27 DIAGNOSIS — Y92009 Unspecified place in unspecified non-institutional (private) residence as the place of occurrence of the external cause: Secondary | ICD-10-CM

## 2018-12-27 DIAGNOSIS — Y9301 Activity, walking, marching and hiking: Secondary | ICD-10-CM | POA: Insufficient documentation

## 2018-12-27 DIAGNOSIS — W01198A Fall on same level from slipping, tripping and stumbling with subsequent striking against other object, initial encounter: Secondary | ICD-10-CM | POA: Insufficient documentation

## 2018-12-27 DIAGNOSIS — Y92014 Private driveway to single-family (private) house as the place of occurrence of the external cause: Secondary | ICD-10-CM | POA: Diagnosis not present

## 2018-12-27 DIAGNOSIS — Y998 Other external cause status: Secondary | ICD-10-CM | POA: Diagnosis not present

## 2018-12-27 DIAGNOSIS — H538 Other visual disturbances: Secondary | ICD-10-CM | POA: Insufficient documentation

## 2018-12-27 DIAGNOSIS — I2510 Atherosclerotic heart disease of native coronary artery without angina pectoris: Secondary | ICD-10-CM | POA: Diagnosis not present

## 2018-12-27 DIAGNOSIS — E119 Type 2 diabetes mellitus without complications: Secondary | ICD-10-CM | POA: Diagnosis not present

## 2018-12-27 DIAGNOSIS — M542 Cervicalgia: Secondary | ICD-10-CM | POA: Insufficient documentation

## 2018-12-27 DIAGNOSIS — W19XXXA Unspecified fall, initial encounter: Secondary | ICD-10-CM

## 2018-12-27 DIAGNOSIS — S0232XA Fracture of orbital floor, left side, initial encounter for closed fracture: Secondary | ICD-10-CM | POA: Diagnosis not present

## 2018-12-27 DIAGNOSIS — Z79899 Other long term (current) drug therapy: Secondary | ICD-10-CM | POA: Diagnosis not present

## 2018-12-27 DIAGNOSIS — R0789 Other chest pain: Secondary | ICD-10-CM | POA: Diagnosis not present

## 2018-12-27 DIAGNOSIS — S0285XA Fracture of orbit, unspecified, initial encounter for closed fracture: Secondary | ICD-10-CM

## 2018-12-27 DIAGNOSIS — S0990XA Unspecified injury of head, initial encounter: Secondary | ICD-10-CM

## 2018-12-27 DIAGNOSIS — Z7982 Long term (current) use of aspirin: Secondary | ICD-10-CM | POA: Insufficient documentation

## 2018-12-27 DIAGNOSIS — I10 Essential (primary) hypertension: Secondary | ICD-10-CM | POA: Diagnosis not present

## 2018-12-27 DIAGNOSIS — S022XXA Fracture of nasal bones, initial encounter for closed fracture: Secondary | ICD-10-CM | POA: Diagnosis not present

## 2018-12-27 DIAGNOSIS — S0512XA Contusion of eyeball and orbital tissues, left eye, initial encounter: Secondary | ICD-10-CM | POA: Diagnosis not present

## 2018-12-27 MED ORDER — HYDROCODONE-ACETAMINOPHEN 5-325 MG PO TABS: 1.0000 | ORAL_TABLET | Freq: Four times a day (QID) | ORAL | 0 refills | Status: DC | PRN

## 2018-12-27 NOTE — Discharge Instructions (Addendum)
?   Do not touch, rub, or try to move your eye. ? Do not blow your nose until your health care provider says it is okay. ? Stay away from dusty areas. ? Avoid traveling by plane or going to high-altitude areas until you recover. These activities may slow the healing of your swelling and may increase sinus pain.  Contact a health care provider if you: ? Have vision changes, such as double vision that gets worse. ? Notice that the redness or swelling around your eye gets worse instead of better. ? Notice blood or fluid starting to leak from your nose. ? Have a fever.

## 2018-12-27 NOTE — ED Provider Notes (Signed)
[place] [facility] EMERGENCY DEPARTMENT Provider Note   CSN: [id number] Arrival date & time: 12/27/18  1307     History   Chief Complaint Chief Complaint  Patient presents with   Fall    HPI Tammy Pineda is a 73 y.o. female with a past medical history of hypertension, GERD presenting to the ED with a chief complaint of left-sided facial pain after fall that occurred about 24 hours ago.  She tripped over a pipe in her yard and fell directly onto the concrete driveway at her home.  She denies any loss of consciousness.  Reports left-sided facial pain.  She was seen by her PCP earlier, x-ray of the face was done which showed possible orbital wall fracture but she was sent to the ED for CT imaging.  She has been taking hydrocodone as needed to help with her pain.  Report intermittent blurry vision in the left eye but denies any pain with EOMs, double vision, shortness of breath, numbness in arms or legs, vomiting or anticoagulant use.  Reports soreness throughout her entire torso and neck resulting from the fall.     HPI  Past Medical History:  Diagnosis Date   Anxiety    Arthritis    Coronary artery disease    nonobstructive by cardiac catheterization 2011 with a 30% LAD lesion   Diabetes ([place])    Type 2   GERD (gastroesophageal reflux disease)    Glaucoma    Hypercholesteremia    Hypertension    PVC (premature ventricular contraction)    Seasonal allergies     Patient Active Problem List   Diagnosis Date Noted   Type 2 diabetes mellitus with hyperglycemia, without long-term current use of insulin ([place]) 04/27/2018   Jaw pain 11/19/2012   Obstructive sleep apnea 08/23/2012   CAD, NATIVE VESSEL 12/21/2009   PALPITATIONS 10/19/2009   HYPERLIPIDEMIA 09/26/2008   Essential hypertension 09/26/2008   REACTIVE AIRWAY DISEASE 09/26/2008   GERD 09/26/2008   ARTHROSCOPY, LEFT KNEE, HX OF 09/26/2008    Past Surgical History:  Procedure Laterality Date    APPENDECTOMY  1959   BUNIONECTOMY     CARDIAC CATHETERIZATION  2011   clean cath   CARDIOVASCULAR STRESS TEST     CATARACT EXTRACTION W/ INTRAOCULAR LENS IMPLANT Right 2010   KNEE ARTHROSCOPY Left    NASAL RECONSTRUCTION  1976   SHOULDER ARTHROSCOPY Left    TOTAL ABDOMINAL HYSTERECTOMY  1976     OB History   No obstetric history on file.      Home Medications    Prior to Admission medications   Medication Sig Start Date End Date Taking? Authorizing Provider  aspirin EC 81 MG tablet Take 81 mg by mouth every evening.    [provider]  brimonidine (ALPHAGAN) 0.2 % ophthalmic solution Place 1 drop into both eyes 2 (two) times daily.    [provider]  Cholecalciferol (VITAMIN D) 2000 UNITS CAPS Take 2,000 Units by mouth at bedtime.     [provider]  CRESTOR 10 MG tablet Take 5 mg by mouth every evening.  09/22/13   [provider]  escitalopram (LEXAPRO) 10 MG tablet Take 10 mg by mouth at bedtime.  06/24/14   [provider]  HYDROcodone-Acetaminophen 5-300 MG TABS Take 0.5 mg by mouth 2 (two) times daily as needed (pain).    [provider]  ibuprofen (ADVIL,MOTRIN) 200 MG tablet Take 400-800 mg by mouth daily as needed for moderate pain.  [provider]  latanoprost (XALATAN) 0.005 % ophthalmic solution Place 1 drop into both eyes at bedtime.    [provider]  LORazepam (ATIVAN) 1 MG tablet Take 0.5 mg by mouth daily as needed for anxiety.     [provider]  losartan-hydrochlorothiazide (HYZAAR) 50-12.5 MG per tablet Take 1 tablet by mouth every evening.    [provider]  Methylcellulose, Laxative, (CITRUCEL) 500 MG TABS Take 500 mg by mouth at bedtime.     [provider]  Misc Natural Products (TURMERIC CURCUMIN) CAPS Take 1 capsule by mouth daily.    [provider]  Probiotic CAPS Take 1 capsule by mouth daily.    [provider]  timolol  (BETIMOL) 0.5 % ophthalmic solution Place 1 drop into both eyes daily.    [provider]    Family History Family History  Problem Relation Age of Onset   Heart disease Father        open heart surgery at 40   Colon cancer Father    Heart disease Mother        open heart surgery at age 42   Hypertension Mother    Heart attack Maternal Grandfather    Stroke Paternal Grandmother    Heart block Brother     Social History Social History   Tobacco Use   Smoking status: Never Smoker   Smokeless tobacco: Never Used  Substance Use Topics   Alcohol use: No   Drug use: No     Allergies   Metformin and related, Gabapentin, Sulfamethoxazole-trimethoprim, and Tape   Review of Systems Review of Systems  Constitutional: Negative for appetite change, chills and fever.  HENT: Positive for facial swelling. Negative for ear pain, rhinorrhea, sneezing and sore throat.   Eyes: Negative for photophobia and visual disturbance.  Respiratory: Negative for cough, chest tightness, shortness of breath and wheezing.   Cardiovascular: Negative for chest pain and palpitations.  Gastrointestinal: Negative for abdominal pain, blood in stool, constipation, diarrhea, nausea and vomiting.  Genitourinary: Negative for dysuria, hematuria and urgency.  Musculoskeletal: Negative for myalgias.  Skin: Positive for wound. Negative for rash.  Neurological: Negative for dizziness, weakness and light-headedness.     Physical Exam Updated Vital Signs BP (!) 145/58 (BP Location: Right Arm)    Pulse (!) 55    Temp 98.7 F (37.1 C)    Resp 18    Ht 5' 6"  (1.676 m)    Wt 74.8 kg    SpO2 99%    BMI 26.63 kg/m   Physical Exam Vitals signs and nursing note reviewed.  Constitutional:      General: She is not in acute distress.    Appearance: She is well-developed.  HENT:     Head: Normocephalic.     Comments: TTP and ecchymosis surrounding the left eye with mild edema noted.  No pain with  EOMs.  Subconjunctival hemorrhage noted.    Nose: Nose normal.  Eyes:     General: No scleral icterus.       Right eye: No discharge.        Left eye: No discharge.     Conjunctiva/sclera: Conjunctivae normal.     Pupils: Pupils are equal, round, and reactive to light.  Neck:     Musculoskeletal: Normal range of motion and neck supple.  Cardiovascular:     Rate and Rhythm: Normal rate and regular rhythm.     Heart sounds: Normal heart sounds. No murmur. No friction  rub. No gallop.   Pulmonary:     Effort: Pulmonary effort is normal. No respiratory distress.     Breath sounds: Normal breath sounds.  Abdominal:     General: Bowel sounds are normal. There is no distension.     Palpations: Abdomen is soft.     Tenderness: There is no abdominal tenderness. There is no guarding.  Musculoskeletal: Normal range of motion.  Skin:    General: Skin is warm and dry.     Findings: No rash.  Neurological:     General: No focal deficit present.     Mental Status: She is alert and oriented to person, place, and time.     Cranial Nerves: No cranial nerve deficit.     Sensory: No sensory deficit.     Motor: No weakness or abnormal muscle tone.     Coordination: Coordination normal.      ED Treatments / Results  Labs (all labs ordered are listed, but only abnormal results are displayed) Labs Reviewed - No data to display  EKG None  Radiology Ct Maxillofacial Wo Contrast  Result Date: 12/27/2018 CLINICAL DATA:  Patient tripped and fell yesterday. Left facial swelling and bruising to forehead region. EXAM: CT MAXILLOFACIAL WITHOUT CONTRAST TECHNIQUE: Multidetector CT imaging of the maxillofacial structures was performed. Multiplanar CT image reconstructions were also generated. COMPARISON:  None. FINDINGS: Osseous: Left inferior orbital wall blowout fracture evident. Medial wall of the right orbit is intact and no evidence for associated orbital rim fracture. Inferior orbital wall fracture  extends anteriorly into the anterior wall of the right maxillary sinus. Although no definite fracture is evident, there is some gas in the soft tissues of the infratemporal fossa raising the question of nondisplaced fracture involving the posterior wall of the left maxillary sinus. Left zygomatic arch is intact. The mandible is intact. Temporomandibular joints are located. Degenerative changes evident in the temporomandibular joints bilaterally. Minimally displaced fractures of the nasal bones are associated Orbits: As above. Sinuses: Air-fluid level in the left maxillary sinus compatible with hemorrhage. The remaining paranasal sinuses are clear. No evidence for fluid in the mastoid air cells or middle ear regions. Soft tissues: Swelling/contusion noted in the left cheek and orbital region. Limited intracranial: Unremarkable. IMPRESSION: 1. Left inferior orbital wall blowout fracture with extension into the anterior wall of the left maxillary sinus. Air-fluid level in the left maxillary sinus is compatible with hemorrhage. 2. Although no displaced acute fracture is seen in the posterior wall the left maxillary sinus there are some gas bubbles in the soft tissues of the left infratemporal fossa, raising this possibility. 3. Minimally displaced bilateral nasal bone fracture. 4. No zygomatic arch fracture. 5. Degenerative changes in the temporomandibular joints bilaterally. 6. Electronically Signed   By: Misty Stanley M.D.   On: 12/27/2018 15:26    Procedures Procedures (including critical care time)  Medications Ordered in ED Medications - No data to display   Initial Impression / Assessment and Plan / ED Course  I have reviewed the triage vital signs and the nursing notes.  Pertinent labs & imaging results that were available during my care of the patient were reviewed by me and considered in my medical decision making (see chart for details).        73 year old female with a past medical history  of hypertension, GERD presents to the ED with a chief complaint of left-sided facial pain after fall that occurred 24 hours ago.  X-ray of the face done at  PCPs office prior to arrival showed possible orbital wall fracture but sent to the ED for CT imaging.  Improvement in pain with hydrocodone.  Denies any double vision, pain with EOMs, shortness of breath, numbness in her legs or anticoagulant use.  On exam there is ecchymosis, mild edema surrounding the left eye with no changes to EOMs or pain with EOMs noted.  Pupils are reactive and equal.  CT confirms nasal bone fracture, orbital wall fracture and possible maxillary fracture.  Patient's vital signs are within normal limits here.  We will have her continue pain medication, give maxillofacial follow-up as well as instructions regarding precautions for facial bone fractures.  Patient is hemodynamically stable, in NAD, and able to ambulate in the ED. Evaluation does not show pathology that would require ongoing emergent intervention or inpatient treatment. I explained the diagnosis to the patient. Pain has been managed and has no complaints prior to discharge. Patient is comfortable with above plan and is stable for discharge at this time. All questions were answered prior to disposition. Strict return precautions for returning to the ED were discussed. Encouraged follow up with PCP.   An After Visit Summary was printed and given to the patient.   Portions of this note were generated with [employer]. Dictation errors may occur despite best attempts at proofreading.   Final Clinical Impressions(s) / ED Diagnoses   Final diagnoses:  Fall in home, initial encounter  Injury of head, initial encounter  Closed fracture of orbital wall, initial encounter [facility])    ED Discharge Orders    None       Delia Heady, PA-C 12/27/18 1610    Lajean Saver, MD 12/28/18 1334

## 2018-12-27 NOTE — ED Triage Notes (Signed)
Pt reports fall yesterday after tripping. Pt denies loc, presents with left side face swelling and bruising to forehead, eye and cheek. Pt denies blood thinners, Pt reports hydrocodone 74m pta.

## 2018-12-30 DIAGNOSIS — S022XXA Fracture of nasal bones, initial encounter for closed fracture: Secondary | ICD-10-CM | POA: Diagnosis not present

## 2018-12-30 DIAGNOSIS — S0285XA Fracture of orbit, unspecified, initial encounter for closed fracture: Secondary | ICD-10-CM | POA: Diagnosis not present

## 2019-01-13 DIAGNOSIS — Z85828 Personal history of other malignant neoplasm of skin: Secondary | ICD-10-CM | POA: Diagnosis not present

## 2019-01-13 DIAGNOSIS — B078 Other viral warts: Secondary | ICD-10-CM | POA: Diagnosis not present

## 2019-02-08 ENCOUNTER — Other Ambulatory Visit: Payer: Self-pay

## 2019-02-08 ENCOUNTER — Encounter: Payer: Self-pay | Admitting: Internal Medicine

## 2019-02-08 ENCOUNTER — Ambulatory Visit (INDEPENDENT_AMBULATORY_CARE_PROVIDER_SITE_OTHER): Payer: Medicare Other | Admitting: Internal Medicine

## 2019-02-08 VITALS — BP 138/70 | HR 73 | Ht 66.0 in | Wt 166.0 lb

## 2019-02-08 DIAGNOSIS — E1165 Type 2 diabetes mellitus with hyperglycemia: Secondary | ICD-10-CM | POA: Diagnosis not present

## 2019-02-08 DIAGNOSIS — E663 Overweight: Secondary | ICD-10-CM | POA: Diagnosis not present

## 2019-02-08 DIAGNOSIS — E785 Hyperlipidemia, unspecified: Secondary | ICD-10-CM | POA: Diagnosis not present

## 2019-02-08 LAB — MICROALBUMIN / CREATININE URINE RATIO
Creatinine,U: 64.1 mg/dL
Microalb Creat Ratio: 1.1 mg/g (ref 0.0–30.0)
Microalb, Ur: <0.7 mg/dL (ref 0.0–1.9)

## 2019-02-08 LAB — POCT GLYCOSYLATED HEMOGLOBIN (HGB A1C): Hemoglobin A1C: 6.4 % — AB (ref 4.0–5.6)

## 2019-02-08 LAB — LIPID PANEL
Cholesterol: 160 mg/dL (ref 0–200)
HDL: 49.9 mg/dL (ref >39.00)
NonHDL: 109.97
Total CHOL/HDL Ratio: 3
Triglycerides: 257 mg/dL — ABNORMAL HIGH (ref 0.0–149.0)
VLDL: 51.4 mg/dL — ABNORMAL HIGH (ref 0.0–40.0)

## 2019-02-08 LAB — LDL CHOLESTEROL, DIRECT: Direct LDL: 66 mg/dL

## 2019-02-08 NOTE — Progress Notes (Signed)
Patient ID: Tammy Pineda, female   DOB: [birth year], 74 y.o.   MRN: [id number]   This visit occurred during the SARS-CoV-2 public health emergency.  Safety protocols were in place, including screening questions prior to the visit, additional usage of staff PPE, and extensive cleaning of exam room while observing appropriate contact time as indicated for disinfecting solutions.   HPI: Tammy Pineda is a 74 y.o.-year-old female, initially referred by her PCP, Dr. Drema Pineda, returning for follow-up for DM2, dx in "several years ago", non-insulin-dependent, uncontrolled, with complications (CAD, CKD stage III, NASH). Tammy Pineda, her husband, is also my pt. Last visit 3.5 months ago.  Reviewed HbA1c levels: Lab Results  Component Value Date   HGBA1C 5.9 10/08/2018   HGBA1C 6.2 (A) 08/04/2018  02/26/2018: HbA1c 6.5% 09/20/2017: HbA1c 7.3% 04/18/2017: HbA1c 7.6%  She was on: - Metformin 1000 mg 2x a day, with meals -added 11/2017 - had significant diarrhea >> metformin ER 1000 mg 2x a day which she was tolerating well, however, she then started to have severe diarrhea >> she stopped TPN in 09/2018.  She is currently off diabetic medicines.  Pt checks her sugars once a day: - am: 110-140, 167 >>127-151 >> 92, 112-142, 150, 159 - 2h after b'fast: n/c >> 97-139, 170 >> n/c - before lunch: n/c >> 106-121 >> 119 >> 86 - 2h after lunch: n/c >> 106-139, 151 >> n/c - before dinner: n/c >> 85-126 >> n/c >> 68 (Glipizide), 75-124 - 2h after dinner: n/c >> 77-127 >> 96, 129 >> 73, 165 - bedtime: n/c >> 113, 117 - nighttime: n/c Lowest sugar was 110 >> 77 >> 96; it is unclear at which CBG level she has hypoglycemia awareness. Highest sugar was 150s >> 170 >> 156 >> 188 (correction of 68).  Glucometer: One Touch variable  Pt's meals are: - Breakfast: eggs, cereal + fruit, egg waffles and sugar-free syrup - Lunch: salad, grilled chicken - Dinner: baked meat, veggies, starch  - Snacks: 1-2x - at  bedtime - granola  In the past, she was exercising at [employer] and also dancing 2-3 times a week.  However, she stopped due to the corona virus pandemic.  In 2018, she lost almost 20 pounds.  -+ CKD stage III, last BUN/creatinine:  02/26/2018: 23/0.94 Lab Results  Component Value Date   BUN 18 02/09/2012   BUN 25 (H) 11/30/2009   CREATININE 0.89 02/09/2012   CREATININE 1.0 11/30/2009  On losartan 50.  -+ HL; last set of lipids: 04/2018: will need records Lab Results  Component Value Date   CHOL 143 12/22/2009   HDL 39.80 12/22/2009   LDLCALC 70 12/22/2009   TRIG 165.0 (H) 12/22/2009   CHOLHDL 4 12/22/2009  On Crestor 5.  - last eye exam was in 02/2018, + glaucoma, no DR. Dr. Katy Pineda. Had cataract sx.  -She denies numbness and tingling in her feet.  Pt has FH of DM in father and brother.   She also has a history of HTN-on Lasix as needed, IBS with diarrhea, GERD, NASH with stage 2 fibrosis- in a study, vaginal prolapse - h/o surgery.  She is on Remicade for inflammatory arthritis.  ROS: Constitutional: no weight gain/no weight loss, no fatigue, no subjective hyperthermia, no subjective hypothermia Eyes: no blurry vision, no xerophthalmia ENT: no sore throat, no nodules palpated in neck, no dysphagia, no odynophagia, no hoarseness Cardiovascular: no CP/no SOB/no palpitations/no leg swelling Respiratory: no cough/no SOB/no wheezing Gastrointestinal: no N/no V/no D/no  C/no acid reflux Musculoskeletal: no muscle aches/no joint aches Skin: no rashes, no hair loss Neurological: no tremors/no numbness/no tingling/no dizziness  I reviewed pt's medications, allergies, PMH, social hx, family hx, and changes were documented in the history of present illness. Otherwise, unchanged from my initial visit note.  Past Medical History:  Diagnosis Date  . Anxiety   . Arthritis   . Coronary artery disease    nonobstructive by cardiac catheterization 2011 with a 30% LAD lesion  .  Diabetes ([place])    Type 2  . GERD (gastroesophageal reflux disease)   . Glaucoma   . Hypercholesteremia   . Hypertension   . PVC (premature ventricular contraction)   . Seasonal allergies    Past Surgical History:  Procedure Laterality Date  . APPENDECTOMY  1959  . BUNIONECTOMY    . CARDIAC CATHETERIZATION  2011   clean cath  . CARDIOVASCULAR STRESS TEST    . CATARACT EXTRACTION W/ INTRAOCULAR LENS IMPLANT Right 2010  . KNEE ARTHROSCOPY Left   . NASAL RECONSTRUCTION  1976  . SHOULDER ARTHROSCOPY Left   . TOTAL ABDOMINAL HYSTERECTOMY  1976   Social History   Socioeconomic History  . Marital status: Married    Spouse name: Not on file  . Number of children: 1  . Years of education: Not on file  . Highest education level: Not on file  Occupational History  . Occupation: Therapist, sports - retired    Fish farm manager: [employer]     Comment: [place]  Tobacco Use  . Smoking status: Never Smoker  . Smokeless tobacco: Never Used  Substance and Sexual Activity  . Alcohol use: No  . Drug use: No   Current Outpatient Medications on File Prior to Visit  Medication Sig Dispense Refill  . aspirin EC 81 MG tablet Take 81 mg by mouth every evening.    . brimonidine (ALPHAGAN) 0.2 % ophthalmic solution Place 1 drop into both eyes 2 (two) times daily.    . Cholecalciferol (VITAMIN D) 2000 UNITS CAPS Take 2,000 Units by mouth at bedtime.     . CRESTOR 10 MG tablet Take 5 mg by mouth every evening.     . escitalopram (LEXAPRO) 10 MG tablet Take 10 mg by mouth at bedtime.     Marland Kitchen HYDROcodone-acetaminophen (NORCO/VICODIN) 5-325 MG tablet Take 1 tablet by mouth every 6 (six) hours as needed. 6 tablet 0  . ibuprofen (ADVIL,MOTRIN) 200 MG tablet Take 400-800 mg by mouth daily as needed for moderate pain.    Marland Kitchen latanoprost (XALATAN) 0.005 % ophthalmic solution Place 1 drop into both eyes at bedtime.    Marland Kitchen LORazepam (ATIVAN) 1 MG tablet Take 0.5 mg by mouth daily as needed for anxiety.     Marland Kitchen  losartan-hydrochlorothiazide (HYZAAR) 50-12.5 MG per tablet Take 1 tablet by mouth every evening.    . Methylcellulose, Laxative, (CITRUCEL) 500 MG TABS Take 500 mg by mouth at bedtime.     . Misc Natural Products (TURMERIC CURCUMIN) CAPS Take 1 capsule by mouth daily.    . Probiotic CAPS Take 1 capsule by mouth daily.    . timolol (BETIMOL) 0.5 % ophthalmic solution Place 1 drop into both eyes daily.     No current facility-administered medications on file prior to visit.   Also, Metformin 1000 mg 2x a day.  Allergies  Allergen Reactions  . Metformin And Related Diarrhea  . Gabapentin Palpitations  . Sulfamethoxazole-Trimethoprim Rash  . Tape Rash   Family History  Problem Relation Age of Onset  . Heart disease Father        open heart surgery at 23  . Colon cancer Father   . Heart disease Mother        open heart surgery at age 10  . Hypertension Mother   . Heart attack Maternal Grandfather   . Stroke Paternal Grandmother   . Heart block Brother     PE: BP 138/70   Pulse 73   Ht 5' 6"  (1.676 m)   Wt 166 lb (75.3 kg)   SpO2 96%   BMI 26.79 kg/m  Wt Readings from Last 3 Encounters:  02/08/19 166 lb (75.3 kg)  12/27/18 165 lb (74.8 kg)  10/26/18 159 lb (72.1 kg)   Constitutional: overweight, in NAD Eyes: PERRLA, EOMI, no exophthalmos ENT: moist mucous membranes, no thyromegaly, no cervical lymphadenopathy Cardiovascular: RRR, No MRG Respiratory: CTA B Gastrointestinal: abdomen soft, NT, ND, BS+ Musculoskeletal: + Arthritic finger deformities, strength intact in all 4 Skin: moist, warm, no rashes Neurological: no tremor with outstretched hands, DTR normal in all 4   ASSESSMENT: 1. DM2, non-insulin-dependent, uncontrolled, with long-term complications - CAD - CKD stage III - NASH  2. HL  3. Overweight  PLAN:  1. Patient with longstanding, previously uncontrolled type 2 diabetes, now with better control (HbA1c at last visit 5.9%).  However, before last  visit, she developed severe diarrhea from Metformin ER and had to stop.  Her diarrhea resolved after stopping Metformin. -At last visit, sugars were above target in the morning and they were at goal later in the day.  I advised her to only watch sugars carefully and let me know if they were increasing but we did not start any other medication. She did not want to restart Metformin -At this visit, sugars remain fairly well-controlled, but with occasional higher blood sugars in the morning.  She has 2 CBGs in the 150s in the last 3 months, several in the 140s and the rest of them are lower than 130. -She had one instance of mild hypoglycemia, at 66, after she took her husband's glipizide at bedtime as she ate too much on New Year's Eve.  She felt poorly until she brought her sugars and tells me that she learned her lesson and will not take this again. -At this visit, we do not absolutely needs to add another diabetic medicine for now - I suggested to:  Patient Instructions  Please continue with our diabetic medicines for now.  Check sugars once a day, rotating check times.  Please stop at the lab.  Please return in 6 months with your glucometer or sugar log.   - we checked her HbA1c: 6.4% (higher) - advised to check sugars at different times of the day - 1x a day, rotating check times - advised for yearly eye exams >> she is UTD - return to clinic in 6 months  2. HL - Reviewed latest lipid panel from 2011 - LDL at goal: Lab Results  Component Value Date   CHOL 143 12/22/2009   HDL 39.80 12/22/2009   LDLCALC 70 12/22/2009   TRIG 165.0 (H) 12/22/2009   CHOLHDL 4 12/22/2009  - Continues Crestor without side effects. - will recheck today - nonfasting  3. Overweight -She feels that she ate too much over the holidays and will start watching her diet -Her weight is higher by 7 pounds since last visit  Office Visit on 02/08/2019  Component Date Value Ref Range Status  .  Glucose, Bld  02/08/2019 132* 65 - 99 mg/dL Final   Comment: .            Fasting reference interval . For someone without known diabetes, a glucose value >125 mg/dL indicates that they may have diabetes and this should be confirmed with a follow-up test. .   . BUN 02/08/2019 24  7 - 25 mg/dL Final  . Creat 02/08/2019 1.06* 0.60 - 0.93 mg/dL Final   Comment: For patients >44 years of age, the reference limit for Creatinine is approximately 13% higher for people identified as African-American. .   . GFR, Est Non African American 02/08/2019 51* > OR = 60 mL/min/1.28m Final  . GFR, Est African American 02/08/2019 59* > OR = 60 mL/min/1.7110mFinal  . BUN/Creatinine Ratio 02/08/2019 23* 6 - 22 (calc) Final  . Sodium 02/08/2019 140  135 - 146 mmol/L Final  . Potassium 02/08/2019 4.2  3.5 - 5.3 mmol/L Final  . Chloride 02/08/2019 102  98 - 110 mmol/L Final  . CO2 02/08/2019 28  20 - 32 mmol/L Final  . Calcium 02/08/2019 9.8  8.6 - 10.4 mg/dL Final  . Total Protein 02/08/2019 7.1  6.1 - 8.1 g/dL Final  . Albumin 02/08/2019 4.3  3.6 - 5.1 g/dL Final  . Globulin 02/08/2019 2.8  1.9 - 3.7 g/dL (calc) Final  . AG Ratio 02/08/2019 1.5  1.0 - 2.5 (calc) Final  . Total Bilirubin 02/08/2019 0.5  0.2 - 1.2 mg/dL Final  . Alkaline phosphatase (APISO) 02/08/2019 266* 37 - 153 U/L Final  . AST 02/08/2019 34  10 - 35 U/L Final  . ALT 02/08/2019 55* 6 - 29 U/L Final  . Microalb, Ur 02/08/2019 <0.7  0.0 - 1.9 mg/dL Final  . Creatinine,U 02/08/2019 64.1  mg/dL Final  . Microalb Creat Ratio 02/08/2019 1.1  0.0 - 30.0 mg/g Final  . Cholesterol 02/08/2019 160  0 - 200 mg/dL Final   ATP III Classification       Desirable:  < 200 mg/dL               Borderline High:  200 - 239 mg/dL          High:  > = 240 mg/dL  . Triglycerides 02/08/2019 257.0* 0.0 - 149.0 mg/dL Final   Normal:  <150 mg/dLBorderline High:  150 - 199 mg/dL  . HDL 02/08/2019 49.90  >39.00 mg/dL Final  . VLDL 02/08/2019 51.4* 0.0 - 40.0 mg/dL Final   . Total CHOL/HDL Ratio 02/08/2019 3   Final                  Men          Women1/2 Average Risk     3.4          3.3Average Risk          5.0          4.42X Average Risk          9.6          7.13X Average Risk          15.0          11.0                      . NonHDL 02/08/2019 109.97   Final   NOTE:  Non-HDL goal should be 30 mg/dL higher than patient's LDL goal (i.e. LDL goal of < 70 mg/dL, would have non-HDL  goal of < 100 mg/dL)  . Hemoglobin A1C 02/08/2019 6.4* 4.0 - 5.6 % Final  . Direct LDL 02/08/2019 66.0  mg/dL Final   Optimal:  <100 mg/dLNear or Above Optimal:  100-129 mg/dLBorderline High:  130-159 mg/dLHigh:  160-189 mg/dLVery High:  >190 mg/dL   Kidney function slightly worse.  Will advise her to stay well-hydrated.   Triglycerides high, LDL at goal. Alkaline phosphatase high, ALT also high, AST normal.  Philemon Kingdom, MD PhD [facility] Endocrinology

## 2019-02-08 NOTE — Patient Instructions (Signed)
Please continue off medications for now.  Check sugars once a day, rotating check times.  Please return in 6 months with your meter.

## 2019-02-09 LAB — COMPLETE METABOLIC PANEL WITH GFR
AG Ratio: 1.5 (calc) (ref 1.0–2.5)
ALT: 55 U/L — ABNORMAL HIGH (ref 6–29)
AST: 34 U/L (ref 10–35)
Albumin: 4.3 g/dL (ref 3.6–5.1)
Alkaline phosphatase (APISO): 266 U/L — ABNORMAL HIGH (ref 37–153)
BUN/Creatinine Ratio: 23 (calc) — ABNORMAL HIGH (ref 6–22)
BUN: 24 mg/dL (ref 7–25)
CO2: 28 mmol/L (ref 20–32)
Calcium: 9.8 mg/dL (ref 8.6–10.4)
Chloride: 102 mmol/L (ref 98–110)
Creat: 1.06 mg/dL — ABNORMAL HIGH (ref 0.60–0.93)
GFR, Est African American: 59 mL/min/{1.73_m2} — ABNORMAL LOW (ref >=60)
GFR, Est Non African American: 51 mL/min/{1.73_m2} — ABNORMAL LOW (ref >=60)
Globulin: 2.8 g/dL (calc) (ref 1.9–3.7)
Glucose, Bld: 132 mg/dL — ABNORMAL HIGH (ref 65–99)
Potassium: 4.2 mmol/L (ref 3.5–5.3)
Sodium: 140 mmol/L (ref 135–146)
Total Bilirubin: 0.5 mg/dL (ref 0.2–1.2)
Total Protein: 7.1 g/dL (ref 6.1–8.1)

## 2019-02-15 ENCOUNTER — Other Ambulatory Visit: Payer: Self-pay | Admitting: Family Medicine

## 2019-02-15 DIAGNOSIS — Z1231 Encounter for screening mammogram for malignant neoplasm of breast: Secondary | ICD-10-CM

## 2019-02-17 DIAGNOSIS — E7800 Pure hypercholesterolemia, unspecified: Secondary | ICD-10-CM | POA: Diagnosis not present

## 2019-02-17 DIAGNOSIS — E119 Type 2 diabetes mellitus without complications: Secondary | ICD-10-CM | POA: Diagnosis not present

## 2019-02-17 DIAGNOSIS — I10 Essential (primary) hypertension: Secondary | ICD-10-CM | POA: Diagnosis not present

## 2019-02-17 DIAGNOSIS — E1122 Type 2 diabetes mellitus with diabetic chronic kidney disease: Secondary | ICD-10-CM | POA: Diagnosis not present

## 2019-02-17 DIAGNOSIS — I2510 Atherosclerotic heart disease of native coronary artery without angina pectoris: Secondary | ICD-10-CM | POA: Diagnosis not present

## 2019-02-17 DIAGNOSIS — H401390 Pigmentary glaucoma, unspecified eye, stage unspecified: Secondary | ICD-10-CM | POA: Diagnosis not present

## 2019-02-17 DIAGNOSIS — M1990 Unspecified osteoarthritis, unspecified site: Secondary | ICD-10-CM | POA: Diagnosis not present

## 2019-03-16 DIAGNOSIS — R0981 Nasal congestion: Secondary | ICD-10-CM | POA: Diagnosis not present

## 2019-03-17 DIAGNOSIS — M0609 Rheumatoid arthritis without rheumatoid factor, multiple sites: Secondary | ICD-10-CM | POA: Diagnosis not present

## 2019-03-22 DIAGNOSIS — H401390 Pigmentary glaucoma, unspecified eye, stage unspecified: Secondary | ICD-10-CM | POA: Diagnosis not present

## 2019-03-22 DIAGNOSIS — E7800 Pure hypercholesterolemia, unspecified: Secondary | ICD-10-CM | POA: Diagnosis not present

## 2019-03-22 DIAGNOSIS — E1122 Type 2 diabetes mellitus with diabetic chronic kidney disease: Secondary | ICD-10-CM | POA: Diagnosis not present

## 2019-03-22 DIAGNOSIS — I10 Essential (primary) hypertension: Secondary | ICD-10-CM | POA: Diagnosis not present

## 2019-03-22 DIAGNOSIS — E119 Type 2 diabetes mellitus without complications: Secondary | ICD-10-CM | POA: Diagnosis not present

## 2019-03-22 DIAGNOSIS — I2510 Atherosclerotic heart disease of native coronary artery without angina pectoris: Secondary | ICD-10-CM | POA: Diagnosis not present

## 2019-03-22 DIAGNOSIS — M1990 Unspecified osteoarthritis, unspecified site: Secondary | ICD-10-CM | POA: Diagnosis not present

## 2019-03-24 ENCOUNTER — Other Ambulatory Visit: Payer: Self-pay

## 2019-03-24 ENCOUNTER — Ambulatory Visit
Admission: RE | Admit: 2019-03-24 | Discharge: 2019-03-24 | Disposition: A | Discharge status: Home / Self Care | Payer: Medicare Other | Source: Ambulatory Visit | Attending: Family Medicine | Admitting: Family Medicine

## 2019-03-24 DIAGNOSIS — N644 Mastodynia: Secondary | ICD-10-CM | POA: Diagnosis not present

## 2019-03-24 DIAGNOSIS — Z1231 Encounter for screening mammogram for malignant neoplasm of breast: Secondary | ICD-10-CM

## 2019-03-25 ENCOUNTER — Other Ambulatory Visit: Payer: Self-pay | Admitting: Family Medicine

## 2019-03-25 DIAGNOSIS — N644 Mastodynia: Secondary | ICD-10-CM

## 2019-03-26 DIAGNOSIS — E119 Type 2 diabetes mellitus without complications: Secondary | ICD-10-CM | POA: Diagnosis not present

## 2019-03-26 DIAGNOSIS — H401222 Low-tension glaucoma, left eye, moderate stage: Secondary | ICD-10-CM | POA: Diagnosis not present

## 2019-03-26 DIAGNOSIS — H0011 Chalazion right upper eyelid: Secondary | ICD-10-CM | POA: Diagnosis not present

## 2019-03-26 DIAGNOSIS — Z961 Presence of intraocular lens: Secondary | ICD-10-CM | POA: Diagnosis not present

## 2019-03-26 DIAGNOSIS — H16223 Keratoconjunctivitis sicca, not specified as Sjogren's, bilateral: Secondary | ICD-10-CM | POA: Diagnosis not present

## 2019-03-26 DIAGNOSIS — H401213 Low-tension glaucoma, right eye, severe stage: Secondary | ICD-10-CM | POA: Diagnosis not present

## 2019-03-31 ENCOUNTER — Other Ambulatory Visit: Payer: Self-pay

## 2019-03-31 ENCOUNTER — Ambulatory Visit
Admission: RE | Admit: 2019-03-31 | Discharge: 2019-03-31 | Disposition: A | Discharge status: Home / Self Care | Payer: Medicare Other | Source: Ambulatory Visit | Attending: Family Medicine | Admitting: Family Medicine

## 2019-03-31 DIAGNOSIS — N644 Mastodynia: Secondary | ICD-10-CM | POA: Diagnosis not present

## 2019-03-31 DIAGNOSIS — R922 Inconclusive mammogram: Secondary | ICD-10-CM | POA: Diagnosis not present

## 2019-04-16 DIAGNOSIS — S81811A Laceration without foreign body, right lower leg, initial encounter: Secondary | ICD-10-CM | POA: Diagnosis not present

## 2019-04-20 DIAGNOSIS — I10 Essential (primary) hypertension: Secondary | ICD-10-CM | POA: Diagnosis not present

## 2019-04-20 DIAGNOSIS — H401390 Pigmentary glaucoma, unspecified eye, stage unspecified: Secondary | ICD-10-CM | POA: Diagnosis not present

## 2019-04-20 DIAGNOSIS — N183 Chronic kidney disease, stage 3 unspecified: Secondary | ICD-10-CM | POA: Diagnosis not present

## 2019-04-20 DIAGNOSIS — M1990 Unspecified osteoarthritis, unspecified site: Secondary | ICD-10-CM | POA: Diagnosis not present

## 2019-04-20 DIAGNOSIS — E119 Type 2 diabetes mellitus without complications: Secondary | ICD-10-CM | POA: Diagnosis not present

## 2019-04-20 DIAGNOSIS — E1122 Type 2 diabetes mellitus with diabetic chronic kidney disease: Secondary | ICD-10-CM | POA: Diagnosis not present

## 2019-04-20 DIAGNOSIS — E7800 Pure hypercholesterolemia, unspecified: Secondary | ICD-10-CM | POA: Diagnosis not present

## 2019-04-20 DIAGNOSIS — I2510 Atherosclerotic heart disease of native coronary artery without angina pectoris: Secondary | ICD-10-CM | POA: Diagnosis not present

## 2019-04-26 DIAGNOSIS — H401222 Low-tension glaucoma, left eye, moderate stage: Secondary | ICD-10-CM | POA: Diagnosis not present

## 2019-04-26 DIAGNOSIS — H16223 Keratoconjunctivitis sicca, not specified as Sjogren's, bilateral: Secondary | ICD-10-CM | POA: Diagnosis not present

## 2019-04-26 DIAGNOSIS — H401213 Low-tension glaucoma, right eye, severe stage: Secondary | ICD-10-CM | POA: Diagnosis not present

## 2019-04-26 DIAGNOSIS — Z961 Presence of intraocular lens: Secondary | ICD-10-CM | POA: Diagnosis not present

## 2019-04-30 ENCOUNTER — Other Ambulatory Visit: Payer: Self-pay

## 2019-05-03 ENCOUNTER — Encounter: Payer: Self-pay | Admitting: Internal Medicine

## 2019-05-03 ENCOUNTER — Telehealth: Payer: Self-pay | Admitting: Internal Medicine

## 2019-05-03 ENCOUNTER — Ambulatory Visit (INDEPENDENT_AMBULATORY_CARE_PROVIDER_SITE_OTHER): Payer: Medicare Other | Admitting: Internal Medicine

## 2019-05-03 ENCOUNTER — Other Ambulatory Visit: Payer: Self-pay

## 2019-05-03 VITALS — BP 150/60 | HR 74 | Ht 66.0 in | Wt 174.0 lb

## 2019-05-03 DIAGNOSIS — E1165 Type 2 diabetes mellitus with hyperglycemia: Secondary | ICD-10-CM

## 2019-05-03 DIAGNOSIS — E663 Overweight: Secondary | ICD-10-CM | POA: Diagnosis not present

## 2019-05-03 DIAGNOSIS — E785 Hyperlipidemia, unspecified: Secondary | ICD-10-CM | POA: Diagnosis not present

## 2019-05-03 LAB — POCT GLYCOSYLATED HEMOGLOBIN (HGB A1C): Hemoglobin A1C: 6.9 % — AB (ref 4.0–5.6)

## 2019-05-03 MED ORDER — FARXIGA 5 MG PO TABS: 5.0000 mg | ORAL_TABLET | Freq: Every day | ORAL | 11 refills | Status: DC

## 2019-05-03 NOTE — Progress Notes (Signed)
Patient ID: Tammy Pineda, female   DOB: 1945/04/10, 74 y.o.   MRN: [id number]   This visit occurred during the SARS-CoV-2 public health emergency.  Safety protocols were in place, including screening questions prior to the visit, additional usage of staff PPE, and extensive cleaning of exam room while observing appropriate contact time as indicated for disinfecting solutions.   HPI: Tammy Pineda is a 74 y.o.-year-old female, initially referred by her PCP, Dr. Drema Dallas, returning for follow-up for DM2, dx in "several years ago", non-insulin-dependent, uncontrolled, with complications (CAD, CKD stage III, NASH). [name], her husband, is also my pt. Last visit 6 months ago.  She is in another NASH study.  She was previously in another study that was using a drug which helped her significantly (HbA1c dropped to 6.2%), however, the study was stopped and the drug is not available.  She feels that the medication used in the new study is increased her blood sugars.  She also gained weight.  Reviewed HbA1c levels: Lab Results  Component Value Date   HGBA1C 6.4 (A) 02/08/2019   HGBA1C 5.9 10/08/2018   HGBA1C 6.2 (A) 08/04/2018  02/26/2018: HbA1c 6.5% 09/20/2017: HbA1c 7.3% 04/18/2017: HbA1c 7.6%  She was on: - Metformin 1000 mg 2x a day, with meals -added 11/2017 - had significant diarrhea >> metformin ER 1000 mg 2x a day which she was tolerating well, however, she then started to have severe diarrhea >> she stopped in 09/2018.  She is currently off diabetic medications.  Pt checks her sugars once a day: - am:  92, 112-142, 150, 159 >> 128-157, 163, 220, 232 - 2h after b'fast: n/c >> 97-139, 170 >> n/c >> 66, 150-241 - before lunch: n/c >> 106-121 >> 119 >> 86 >> 68 - 2h after lunch: n/c >> 106-139, 151 >> n/c >>  119, 150 - before dinner: 85-126 >> n/c >> 68 (Glipizide), 75-124 >> 168 - 2h after dinner: n/c >> 77-127 >> 96, 129 >> 73, 165 >> n/c - bedtime: n/c >> 113, 117 >> n/c -  nighttime: n/c Lowest sugar was 77 >> 96 >> 66; it is unclear at which level she has hypoglycemia awareness. Highest sugar was 188 (correction of 68) >> 246.  Glucometer: One Touch variable  Pt's meals are: - Breakfast: eggs, cereal + fruit, egg waffles and sugar-free syrup - Lunch: salad, grilled chicken - Dinner: baked meat, veggies, starch  - Snacks: 1-2x - at bedtime - granola  In the past, she was exercising at [employer] and also dancing 2-3 times a week.  However, she stopped due to the coronavirus pandemic.  In 2018, she lost almost 20 pounds.  -+ CKD stage III, last BUN/creatinine:  Lab Results  Component Value Date   BUN 24 02/08/2019   BUN 18 02/09/2012   CREATININE 1.06 (H) 02/08/2019   CREATININE 0.89 02/09/2012  02/26/2018: 23/0.94 On losartan 50  -+ HL; last set of lipids: Lab Results  Component Value Date   CHOL 160 02/08/2019   HDL 49.90 02/08/2019   LDLCALC 70 12/22/2009   LDLDIRECT 66.0 02/08/2019   TRIG 257.0 (H) 02/08/2019   CHOLHDL 3 02/08/2019  On Crestor 5.  - last eye exam was in 04/2019: + Glaucoma, No DR. Dr. Katy Fitch. Had cataract sx.  - no numbness and tingling in her feet.  Pt has FH of DM in father and brother.   She also has a history of HTN-on Lasix as needed, IBS with diarrhea, GERD, NASH  with stage 2 fibrosis- in a study, vaginal prolapse - h/o surgery.  She is on Remicade for inflammatory arthritis.  ROS: Constitutional: + weight gain/no weight loss, no fatigue, no subjective hyperthermia, no subjective hypothermia Eyes: no blurry vision, no xerophthalmia ENT: no sore throat, no nodules palpated in neck, no dysphagia, no odynophagia, no hoarseness Cardiovascular: no CP/no SOB/no palpitations/no leg swelling Respiratory: no cough/no SOB/no wheezing Gastrointestinal: no N/no V/no D/no C/no acid reflux Musculoskeletal: no muscle aches/no joint aches Skin: no rashes, no hair loss Neurological: no tremors/no numbness/no  tingling/no dizziness  I reviewed pt's medications, allergies, PMH, social hx, family hx, and changes were documented in the history of present illness. Otherwise, unchanged from my initial visit note.  Past Medical History:  Diagnosis Date  . Anxiety   . Arthritis   . Coronary artery disease    nonobstructive by cardiac catheterization 2011 with a 30% LAD lesion  . Diabetes ([place])    Type 2  . GERD (gastroesophageal reflux disease)   . Glaucoma   . Hypercholesteremia   . Hypertension   . PVC (premature ventricular contraction)   . Seasonal allergies    Past Surgical History:  Procedure Laterality Date  . APPENDECTOMY  1959  . BUNIONECTOMY    . CARDIAC CATHETERIZATION  2011   clean cath  . CARDIOVASCULAR STRESS TEST    . CATARACT EXTRACTION W/ INTRAOCULAR LENS IMPLANT Right 2010  . KNEE ARTHROSCOPY Left   . NASAL RECONSTRUCTION  1976  . SHOULDER ARTHROSCOPY Left   . TOTAL ABDOMINAL HYSTERECTOMY  1976   Social History   Socioeconomic History  . Marital status: Married    Spouse name: Not on file  . Number of children: 1  . Years of education: Not on file  . Highest education level: Not on file  Occupational History  . Occupation: Therapist, sports - retired    Fish farm manager: [employer]     Comment: [employer]  Tobacco Use  . Smoking status: Never Smoker  . Smokeless tobacco: Never Used  Substance and Sexual Activity  . Alcohol use: No  . Drug use: No   Current Outpatient Medications on File Prior to Visit  Medication Sig Dispense Refill  . aspirin EC 81 MG tablet Take 81 mg by mouth every evening.    . brimonidine (ALPHAGAN) 0.2 % ophthalmic solution Place 1 drop into both eyes 2 (two) times daily.    . Cholecalciferol (VITAMIN D) 2000 UNITS CAPS Take 2,000 Units by mouth at bedtime.     . CRESTOR 10 MG tablet Take 5 mg by mouth every evening.     . escitalopram (LEXAPRO) 10 MG tablet Take 10 mg by mouth at bedtime.     Marland Kitchen HYDROcodone-acetaminophen (NORCO/VICODIN) 5-325 MG  tablet Take 1 tablet by mouth every 6 (six) hours as needed. 6 tablet 0  . ibuprofen (ADVIL,MOTRIN) 200 MG tablet Take 400-800 mg by mouth daily as needed for moderate pain.    Marland Kitchen latanoprost (XALATAN) 0.005 % ophthalmic solution Place 1 drop into both eyes at bedtime.    Marland Kitchen LORazepam (ATIVAN) 1 MG tablet Take 0.5 mg by mouth daily as needed for anxiety.     Marland Kitchen losartan-hydrochlorothiazide (HYZAAR) 50-12.5 MG per tablet Take 1 tablet by mouth every evening.    . Methylcellulose, Laxative, (CITRUCEL) 500 MG TABS Take 500 mg by mouth at bedtime.     . Misc Natural Products (TURMERIC CURCUMIN) CAPS Take 1 capsule by mouth daily.    . Probiotic  CAPS Take 1 capsule by mouth daily.    . timolol (BETIMOL) 0.5 % ophthalmic solution Place 1 drop into both eyes daily.     No current facility-administered medications on file prior to visit.   Also, Metformin 1000 mg 2x a day.  Allergies  Allergen Reactions  . Metformin And Related Diarrhea  . Gabapentin Palpitations  . Sulfamethoxazole-Trimethoprim Rash  . Tape Rash   Family History  Problem Relation Age of Onset  . Heart disease Father        open heart surgery at 1  . Colon cancer Father   . Heart disease Mother        open heart surgery at age 50  . Hypertension Mother   . Heart attack Maternal Grandfather   . Stroke Paternal Grandmother   . Heart block Brother     PE: BP (!) 150/60   Pulse 74   Ht 5' 6"  (1.676 m)   Wt 174 lb (78.9 kg)   SpO2 97%   BMI 28.08 kg/m  Wt Readings from Last 3 Encounters:  05/03/19 174 lb (78.9 kg)  02/08/19 166 lb (75.3 kg)  12/27/18 165 lb (74.8 kg)   Constitutional: overweight, in NAD Eyes: PERRLA, EOMI, no exophthalmos ENT: moist mucous membranes, no thyromegaly, no cervical lymphadenopathy Cardiovascular: RRR, No MRG Respiratory: CTA B Gastrointestinal: abdomen soft, NT, ND, BS+ Musculoskeletal: + Arthritic finger deformities, strength intact in all 4 Skin: moist, warm, no  rashes Neurological: no tremor with outstretched hands, DTR normal in all 4   ASSESSMENT: 1. DM2, non-insulin-dependent, uncontrolled, with long-term complications - CAD - CKD stage III - NASH  2. HL  3. Overweight  PLAN:  1. Patient with longstanding, previously uncontrolled type 2 diabetes, now with better control and off diabetic medications.  She had severe diarrhea from Metformin ER in the past and had to stop.  Diarrhea resolved after stopping Metformin.  Sugars remained fairly well-controlled afterwards and we did not start any medications.  She would not want to restart Metformin. -Before last visit she had mild hypoglycemia, at 68, after she took her husband glipizide at bedtime.  We discussed about not doing this. -Most recent HbA1c from last month was higher, at 6.4%, but still at goal for her age -We repeated her HbA1c at this visit and this was 6.9% (higher).  Reviewing her sugar log, sugars are higher than before, with few lower blood sugars in the 60s.  We discussed about the fact that dietary indiscretions can cause occasional hyperglycemic peaks (in the 200s for her), but overall, the sugars are above goal and, at this point, I suggested an SGLT2 inhibitor.  I would use a low dose for now.  Discussed about benefits and possible side effects.  Advised her to stay well-hydrated. - I suggested to:  Patient Instructions  Please start: - Farxiga 5 mg daily before b'fast  Check sugars once a day, rotating check times.  Please return in 3-4 months with your sugar log.   - advised to check sugars at different times of the day - 1x a day, rotating check times - advised for yearly eye exams >> she is UTD - return to clinic in 3-4 months  2. HL -Reviewed latest lipid panel: LDL at goal, triglycerides high: Lab Results  Component Value Date   CHOL 160 02/08/2019   HDL 49.90 02/08/2019   LDLCALC 70 12/22/2009   LDLDIRECT 66.0 02/08/2019   TRIG 257.0 (H) 02/08/2019    CHOLHDL 3  02/08/2019  -Continues Crestor without side effects  3. Overweight -At last visit she gained 7 pounds from the previous visit -She gained 14 more pounds from last visit (159 >> 174) -We will start an SGLT 2 inhibitor which should also help with weight loss  Philemon Kingdom, MD PhD [facility]

## 2019-05-03 NOTE — Addendum Note (Signed)
Addended by: Cardell Peach I on: 05/03/2019 03:01 PM   Modules accepted: Orders

## 2019-05-03 NOTE — Patient Instructions (Signed)
Please start: - Farxiga 5 mg daily before b'fast  Check sugars once a day, rotating check times.  Please return in 3-4 months with your sugar log.

## 2019-05-03 NOTE — Telephone Encounter (Signed)
Noted. I would still continue with the plan to start Metformin for now and follow the morning sugars.  She may need to reduce the dose or even stop the Metformin in the near future if they improve.

## 2019-05-03 NOTE — Telephone Encounter (Signed)
Patient called to advise that she has cancelled the liver study.

## 2019-05-04 ENCOUNTER — Telehealth: Payer: Self-pay

## 2019-05-04 NOTE — Telephone Encounter (Signed)
Patient calling to let us know Tammy Pineda will cost her almost 400.00 for 30 day supply. Patient is applying for assistance though the drug manufacturer but until then is there something else she can take?

## 2019-05-04 NOTE — Telephone Encounter (Signed)
Because of the fluctuations in her blood sugars, I would suggest for now that she only watches her diet and see how the sugars do after she stops the medications for the liver trial.  However, let me know if her sugars increase or remain high consistently

## 2019-05-05 NOTE — Telephone Encounter (Signed)
Notified patient of message, she stopped the liver meds 3 months ago.

## 2019-05-05 NOTE — Telephone Encounter (Signed)
Ok to just watch her blood sugars for now and work on [employer], but if she cannot get Wilder Glade within the next month, please let me know

## 2019-05-06 DIAGNOSIS — M0609 Rheumatoid arthritis without rheumatoid factor, multiple sites: Secondary | ICD-10-CM | POA: Diagnosis not present

## 2019-05-06 DIAGNOSIS — Z79899 Other long term (current) drug therapy: Secondary | ICD-10-CM | POA: Diagnosis not present

## 2019-05-06 NOTE — Telephone Encounter (Signed)
Notified patient of message from Dr. Cruzita Lederer, patient expressed understanding and agreement. No further questions.

## 2019-05-14 DIAGNOSIS — E1122 Type 2 diabetes mellitus with diabetic chronic kidney disease: Secondary | ICD-10-CM | POA: Diagnosis not present

## 2019-05-14 DIAGNOSIS — R519 Headache, unspecified: Secondary | ICD-10-CM | POA: Diagnosis not present

## 2019-05-14 DIAGNOSIS — R300 Dysuria: Secondary | ICD-10-CM | POA: Diagnosis not present

## 2019-05-14 DIAGNOSIS — F418 Other specified anxiety disorders: Secondary | ICD-10-CM | POA: Diagnosis not present

## 2019-05-14 DIAGNOSIS — M1712 Unilateral primary osteoarthritis, left knee: Secondary | ICD-10-CM | POA: Diagnosis not present

## 2019-05-16 ENCOUNTER — Emergency Department (HOSPITAL_COMMUNITY): Payer: Medicare Other

## 2019-05-16 ENCOUNTER — Encounter (HOSPITAL_COMMUNITY): Payer: Self-pay

## 2019-05-16 ENCOUNTER — Emergency Department (HOSPITAL_COMMUNITY)
Admission: EM | Admit: 2019-05-16 | Discharge: 2019-05-16 | Disposition: A | Discharge status: Home / Self Care | Payer: Medicare Other | Attending: Emergency Medicine | Admitting: Emergency Medicine

## 2019-05-16 ENCOUNTER — Other Ambulatory Visit: Payer: Self-pay

## 2019-05-16 DIAGNOSIS — I10 Essential (primary) hypertension: Secondary | ICD-10-CM | POA: Insufficient documentation

## 2019-05-16 DIAGNOSIS — R519 Headache, unspecified: Secondary | ICD-10-CM | POA: Diagnosis not present

## 2019-05-16 DIAGNOSIS — E119 Type 2 diabetes mellitus without complications: Secondary | ICD-10-CM | POA: Insufficient documentation

## 2019-05-16 DIAGNOSIS — N390 Urinary tract infection, site not specified: Secondary | ICD-10-CM | POA: Diagnosis not present

## 2019-05-16 DIAGNOSIS — R270 Ataxia, unspecified: Secondary | ICD-10-CM | POA: Diagnosis not present

## 2019-05-16 DIAGNOSIS — Z79899 Other long term (current) drug therapy: Secondary | ICD-10-CM | POA: Insufficient documentation

## 2019-05-16 DIAGNOSIS — R42 Dizziness and giddiness: Secondary | ICD-10-CM | POA: Diagnosis not present

## 2019-05-16 DIAGNOSIS — I2510 Atherosclerotic heart disease of native coronary artery without angina pectoris: Secondary | ICD-10-CM | POA: Insufficient documentation

## 2019-05-16 LAB — BASIC METABOLIC PANEL
Anion gap: 9 (ref 5–15)
BUN: 33 mg/dL — ABNORMAL HIGH (ref 8–23)
CO2: 25 mmol/L (ref 22–32)
Calcium: 9.1 mg/dL (ref 8.9–10.3)
Chloride: 104 mmol/L (ref 98–111)
Creatinine, Ser: 0.93 mg/dL (ref 0.44–1.00)
GFR calc Af Amer: >60 mL/min (ref >60)
GFR calc non Af Amer: >60 mL/min (ref >60)
Glucose, Bld: 124 mg/dL — ABNORMAL HIGH (ref 70–99)
Potassium: 3.4 mmol/L — ABNORMAL LOW (ref 3.5–5.1)
Sodium: 138 mmol/L (ref 135–145)

## 2019-05-16 LAB — CBC
HCT: 40.7 % (ref 36.0–46.0)
Hemoglobin: 13.4 g/dL (ref 12.0–15.0)
MCH: 30.1 pg (ref 26.0–34.0)
MCHC: 32.9 g/dL (ref 30.0–36.0)
MCV: 91.5 fL (ref 80.0–100.0)
Platelets: 210 10*3/uL (ref 150–400)
RBC: 4.45 MIL/uL (ref 3.87–5.11)
RDW: 13.2 % (ref 11.5–15.5)
WBC: 11.2 10*3/uL — ABNORMAL HIGH (ref 4.0–10.5)
nRBC: 0 % (ref 0.0–0.2)

## 2019-05-16 LAB — URINALYSIS, ROUTINE W REFLEX MICROSCOPIC
Bilirubin Urine: NEGATIVE
Glucose, UA: NEGATIVE mg/dL
Ketones, ur: NEGATIVE mg/dL
Nitrite: NEGATIVE
Protein, ur: NEGATIVE mg/dL
Specific Gravity, Urine: 1.011 (ref 1.005–1.030)
WBC, UA: >50 WBC/hpf — ABNORMAL HIGH (ref 0–5)
pH: 5 (ref 5.0–8.0)

## 2019-05-16 LAB — CBG MONITORING, ED: Glucose-Capillary: 109 mg/dL — ABNORMAL HIGH (ref 70–99)

## 2019-05-16 MED ORDER — SODIUM CHLORIDE 0.9% FLUSH
3.0000 mL | Freq: Once | INTRAVENOUS | Status: AC
  Administered 2019-05-16: 14:00:00 3 mL via INTRAVENOUS

## 2019-05-16 MED ORDER — CIPROFLOXACIN HCL 500 MG PO TABS: 500.0000 mg | ORAL_TABLET | Freq: Two times a day (BID) | ORAL | 0 refills | Status: AC

## 2019-05-16 MED ORDER — LORAZEPAM 2 MG/ML IJ SOLN
0.5000 mg | Freq: Once | INTRAMUSCULAR | Status: AC
  Administered 2019-05-16: 14:00:00 0.5 mg via INTRAVENOUS
  Filled 2019-05-16: qty 1

## 2019-05-16 MED ORDER — MECLIZINE HCL 25 MG PO TABS: 25.0000 mg | ORAL_TABLET | Freq: Once | ORAL | Status: DC

## 2019-05-16 MED ORDER — LORAZEPAM 0.5 MG PO TABS: 0.5000 mg | ORAL_TABLET | Freq: Three times a day (TID) | ORAL | 0 refills | Status: DC | PRN

## 2019-05-16 MED ORDER — KETOROLAC TROMETHAMINE 15 MG/ML IJ SOLN
7.5000 mg | Freq: Once | INTRAMUSCULAR | Status: AC
  Administered 2019-05-16: 17:00:00 7.5 mg via INTRAVENOUS
  Filled 2019-05-16: qty 1

## 2019-05-16 MED ORDER — MECLIZINE HCL 12.5 MG PO TABS: 12.5000 mg | ORAL_TABLET | Freq: Three times a day (TID) | ORAL | 0 refills | Status: DC | PRN

## 2019-05-16 MED ORDER — CIPROFLOXACIN IN D5W 400 MG/200ML IV SOLN
400.0000 mg | Freq: Once | INTRAVENOUS | Status: AC
  Administered 2019-05-16: 400 mg via INTRAVENOUS
  Filled 2019-05-16: qty 200

## 2019-05-16 MED ORDER — PROCHLORPERAZINE EDISYLATE 10 MG/2ML IJ SOLN
5.0000 mg | Freq: Once | INTRAMUSCULAR | Status: AC
  Administered 2019-05-16: 5 mg via INTRAVENOUS
  Filled 2019-05-16: qty 2

## 2019-05-16 MED ORDER — MECLIZINE HCL 25 MG PO TABS
12.5000 mg | ORAL_TABLET | Freq: Once | ORAL | Status: AC
  Administered 2019-05-16: 14:00:00 12.5 mg via ORAL
  Filled 2019-05-16: qty 1

## 2019-05-16 MED ORDER — SODIUM CHLORIDE 0.9 % IV BOLUS
1000.0000 mL | Freq: Once | INTRAVENOUS | Status: AC
  Administered 2019-05-16: 1000 mL via INTRAVENOUS

## 2019-05-16 NOTE — Discharge Instructions (Addendum)
As discussed, your evaluation today has been largely reassuring.  But, it is important that you monitor your condition carefully, and do not hesitate to return to the ED if you develop new, or concerning changes in your condition. ? ?Otherwise, please follow-up with your physician for appropriate ongoing care. ? ?

## 2019-05-16 NOTE — ED Provider Notes (Signed)
[facility] DEPT Provider Note   CSN: [id number] Arrival date & time: 05/16/19  1132     History Chief Complaint  Patient presents with  . Headache  . Dizziness    Tammy Pineda is a 74 y.o. female.  HPI    Patient presents with concern of headache, nausea, dizziness. Patient is a former Marine scientist. Patient has multiple medical issues, including vertigo.  However, the patient has had no vertigo-like symptoms in about 1 year.  She notes that today, not long after awakening she had disequilibrium, headache, nausea.  Headache has improved somewhat, but is an unusual feature for her compared to prior episodes of vertigo.  No weakness in any extremity, no confusion, no speech changes. Though she has nausea, does not seem as though she has had any vomiting. She has not improved in spite of taking ibuprofen. She denies any focal pain beyond her headache. Past Medical History:  Diagnosis Date  . Anxiety   . Arthritis   . Coronary artery disease    nonobstructive by cardiac catheterization 2011 with a 30% LAD lesion  . Diabetes ([place])    Type 2  . GERD (gastroesophageal reflux disease)   . Glaucoma   . Hypercholesteremia   . Hypertension   . PVC (premature ventricular contraction)   . Seasonal allergies     Patient Active Problem List   Diagnosis Date Noted  . Type 2 diabetes mellitus with hyperglycemia, without long-term current use of insulin ([place]) 04/27/2018  . Jaw pain 11/19/2012  . Obstructive sleep apnea 08/23/2012  . CAD, NATIVE VESSEL 12/21/2009  . PALPITATIONS 10/19/2009  . HYPERLIPIDEMIA 09/26/2008  . Essential hypertension 09/26/2008  . REACTIVE AIRWAY DISEASE 09/26/2008  . GERD 09/26/2008  . ARTHROSCOPY, LEFT KNEE, HX OF 09/26/2008    Past Surgical History:  Procedure Laterality Date  . APPENDECTOMY  1959  . BUNIONECTOMY    . CARDIAC CATHETERIZATION  2011   clean cath  . CARDIOVASCULAR STRESS TEST    . CATARACT EXTRACTION W/  INTRAOCULAR LENS IMPLANT Right 2010  . KNEE ARTHROSCOPY Left   . NASAL RECONSTRUCTION  1976  . SHOULDER ARTHROSCOPY Left   . TOTAL ABDOMINAL HYSTERECTOMY  1976     OB History   No obstetric history on file.     Family History  Problem Relation Age of Onset  . Heart disease Father        open heart surgery at 26  . Colon cancer Father   . Heart disease Mother        open heart surgery at age 61  . Hypertension Mother   . Heart attack Maternal Grandfather   . Stroke Paternal Grandmother   . Heart block Brother     Social History   Tobacco Use  . Smoking status: Never Smoker  . Smokeless tobacco: Never Used  Substance Use Topics  . Alcohol use: No  . Drug use: No    Home Medications Prior to Admission medications   Medication Sig Start Date End Date Taking? Authorizing Provider  ALPRAZolam Duanne Moron) 0.5 MG tablet Take 0.5 mg by mouth daily as needed for anxiety.   Yes [provider]  brimonidine (ALPHAGAN) 0.2 % ophthalmic solution Place 1 drop into both eyes 2 (two) times daily.   Yes [provider]  Cholecalciferol (VITAMIN D) 2000 UNITS CAPS Take 2,000 Units by mouth at bedtime.    Yes [provider]  CRANBERRY PO Take 1 tablet by mouth daily.  Yes [provider]  escitalopram (LEXAPRO) 10 MG tablet Take 10 mg by mouth at bedtime.  06/24/14  Yes [provider]  hydrochlorothiazide (MICROZIDE) 12.5 MG capsule Take 12.5 mg by mouth daily. 04/05/19  Yes [provider]  HYDROcodone-acetaminophen (NORCO/VICODIN) 5-325 MG tablet Take 1 tablet by mouth every 6 (six) hours as needed. Patient taking differently: Take 1 tablet by mouth every 6 (six) hours as needed for moderate pain.  12/27/18  Yes Khatri, Hina, PA-C  ibuprofen (ADVIL,MOTRIN) 200 MG tablet Take 400-800 mg by mouth daily as needed for moderate pain.   Yes [provider]  inFLIXimab (REMICADE) 100 MG injection Inject 100 mg into the vein every 6  (six) weeks.   Yes [provider]  latanoprost (XALATAN) 0.005 % ophthalmic solution Place 1 drop into both eyes at bedtime.   Yes [provider]  losartan (COZAAR) 100 MG tablet Take 100 mg by mouth daily. 04/05/19  Yes [provider]  Misc Natural Products (TURMERIC CURCUMIN) CAPS Take 1 capsule by mouth daily.   Yes [provider]  omeprazole (PRILOSEC) 20 MG capsule Take 20 mg by mouth daily. 02/15/19  Yes [provider]  rosuvastatin (CRESTOR) 5 MG tablet Take 5 mg by mouth daily. 04/05/19  Yes [provider]  timolol (BETIMOL) 0.5 % ophthalmic solution Place 1 drop into both eyes daily.   Yes [provider]  ciprofloxacin (CIPRO) 250 MG tablet Take 250 mg by mouth 2 (two) times daily. 05/14/19   [provider]  dapagliflozin propanediol (FARXIGA) 5 MG TABS tablet Take 5 mg by mouth daily before breakfast. Patient not taking: Reported on 05/16/2019 05/03/19   Philemon Kingdom, MD    Allergies    Metformin and related, Gabapentin, Sulfamethoxazole-trimethoprim, and Tape  Review of Systems   Review of Systems  Constitutional:       Per HPI, otherwise negative  HENT:       Per HPI, otherwise negative  Respiratory:       Per HPI, otherwise negative  Cardiovascular:       Per HPI, otherwise negative  Gastrointestinal: Positive for nausea. Negative for vomiting.  Endocrine:       Negative aside from HPI  Genitourinary:       Neg aside from HPI   Musculoskeletal:       Per HPI, otherwise negative  Skin: Negative.   Neurological: Positive for dizziness and headaches. Negative for syncope.    Physical Exam Updated Vital Signs BP 140/71   Pulse (!) 55   Temp 97.6 F (36.4 C) (Oral)   Resp (!) 21   SpO2 98%   Physical Exam Vitals and nursing note reviewed.  Constitutional:      General: She is not in acute distress.    Appearance: She is well-developed.  HENT:     Head: Normocephalic and atraumatic.    Eyes:     Conjunctiva/sclera: Conjunctivae normal.  Cardiovascular:     Rate and Rhythm: Normal rate and regular rhythm.  Pulmonary:     Effort: Pulmonary effort is normal. No respiratory distress.     Breath sounds: Normal breath sounds. No stridor.  Abdominal:     General: There is no distension.  Skin:    General: Skin is warm and dry.  Neurological:     Mental Status: She is alert and oriented to person, place, and time.     Cranial Nerves: No cranial nerve deficit, dysarthria or facial asymmetry.  Psychiatric:  Mood and Affect: Mood normal.     ED Results / Procedures / Treatments   Labs (all labs ordered are listed, but only abnormal results are displayed) Labs Reviewed  BASIC METABOLIC PANEL - Abnormal; Notable for the following components:      Result Value   Potassium 3.4 (*)    Glucose, Bld 124 (*)    BUN 33 (*)    All other components within normal limits  CBC - Abnormal; Notable for the following components:   WBC 11.2 (*)    All other components within normal limits  URINALYSIS, ROUTINE W REFLEX MICROSCOPIC - Abnormal; Notable for the following components:   APPearance HAZY (*)    Hgb urine dipstick SMALL (*)    Leukocytes,Ua LARGE (*)    WBC, UA >50 (*)    Bacteria, UA RARE (*)    Non Squamous Epithelial 0-5 (*)    All other components within normal limits  CBG MONITORING, ED - Abnormal; Notable for the following components:   Glucose-Capillary 109 (*)    All other components within normal limits    EKG EKG Interpretation  Date/Time:  Sunday May 16 2019 12:00:27 EDT Ventricular Rate:  56 PR Interval:    QRS Duration: 105 QT Interval:  439 QTC Calculation: 424 R Axis:   62 Text Interpretation: Sinus rhythm 12 Lead; Mason-Likar Normal ECG Confirmed by Carmin Muskrat [phone]) on 05/16/2019 1:33:06 PM   Radiology CT Head Wo Contrast  Result Date: 05/16/2019 CLINICAL DATA:  Ataxia, headache EXAM: CT HEAD WITHOUT CONTRAST TECHNIQUE:  Contiguous axial images were obtained from the base of the skull through the vertex without intravenous contrast. COMPARISON:  11/29/2015 FINDINGS: Brain: No evidence of acute infarction, hemorrhage, hydrocephalus, extra-axial collection or mass lesion/mass effect. Scattered low-density changes within the periventricular and subcortical white matter compatible with chronic microvascular ischemic change. Mild diffuse cerebral volume loss. Vascular: Atherosclerotic calcifications involving the large vessels of the skull base. No unexpected hyperdense vessel. Skull: Normal. Negative for fracture or focal lesion. Sinuses/Orbits: No acute finding. Other: None. IMPRESSION: 1.  No acute intracranial findings. 2.  Chronic microvascular ischemic change and cerebral volume loss. Electronically Signed   By: Davina Poke D.O.   On: 05/16/2019 15:39    Procedures Procedures (including critical care time)  Medications Ordered in ED Medications  sodium chloride flush (NS) 0.9 % injection 3 mL (3 mLs Intravenous Given 05/16/19 1334)  sodium chloride 0.9 % bolus 1,000 mL (0 mLs Intravenous Stopped 05/16/19 1622)  LORazepam (ATIVAN) injection 0.5 mg (0.5 mg Intravenous Given 05/16/19 1420)  meclizine (ANTIVERT) tablet 12.5 mg (12.5 mg Oral Given 05/16/19 1420)  ciprofloxacin (CIPRO) IVPB 400 mg (400 mg Intravenous New Bag/Given 05/16/19 1633)  ketorolac (TORADOL) 15 MG/ML injection 7.5 mg (7.5 mg Intravenous Given 05/16/19 1630)  prochlorperazine (COMPAZINE) injection 5 mg (5 mg Intravenous Given 05/16/19 1630)    ED Course  I have reviewed the triage vital signs and the nursing notes.  Pertinent labs & imaging results that were available during my care of the patient were reviewed by me and considered in my medical decision making (see chart for details).   4:27 PM Patient awake, alert, states that her headache is minimal though persistent, nausea essentially resolved, but dizziness is persistent. We discussed  findings thus far including reassuring head CT, which I reviewed, labs which are generally reassuring aside from urinalysis that is abnormal, suggestive of infection.  She confirms she has no fever, no back pain suggesting likelihood of  kidney stone.  She notes that she has had multiple prior UTI, typically with culture demonstrating sensitivity to Cipro, and after discussion on proper antibiotics, she requests this.  We discussed risks of Cipro including tendinitis, particularly those with recent steroid use, in spite of this, the patient has preference for this antibiotic rather than others given her history of cultures. Patient was treated with Cipro, additional medication for her dizziness, required reassessment.   5:57 PM Patient ambulatory, feeling better, headache is resolved, dizziness has improved, though not resolved entirely.  I discussed all findings again at length with the patient and her husband.  Given her improvement, reassuring labs, CT, patient amenable to outpatient follow-up.  Patient will start antibiotics, meds for vertigo.  With her demonstrated ambulatory capacity, absence of other focal neuro deficits, lower suspicion for occult stroke, no evidence for bacteremia, sepsis, though there is evidence for UTI as above. Final Clinical Impression(s) / ED Diagnoses Final diagnoses:  Vertigo  Lower urinary tract infectious disease    Rx / DC Orders ED Discharge Orders         Ordered    ciprofloxacin (CIPRO) 500 MG tablet  2 times daily     05/16/19 1803    meclizine (ANTIVERT) 12.5 MG tablet  3 times daily PRN     05/16/19 1803    LORazepam (ATIVAN) 0.5 MG tablet  Every 8 hours PRN     05/16/19 1803           Carmin Muskrat, MD 05/16/19 2204

## 2019-05-16 NOTE — ED Notes (Signed)
Pt verbalizes understanding of DC instructions. Pt belongings returned and is ambulatory out of ED.  

## 2019-05-16 NOTE — ED Notes (Signed)
[facility] transported to [facility]

## 2019-05-16 NOTE — ED Triage Notes (Signed)
Pt presents with c/o headache, dizziness, and nausea. Pt reports that she has a terrible headache and is incredibly dizzy at this time and believes that her symptoms may be related to eating 3 hot dogs last night. Pt is alert and oriented, no neuro deficits noted.

## 2019-05-18 DIAGNOSIS — R42 Dizziness and giddiness: Secondary | ICD-10-CM | POA: Diagnosis not present

## 2019-05-18 DIAGNOSIS — M25562 Pain in left knee: Secondary | ICD-10-CM | POA: Diagnosis not present

## 2019-05-18 DIAGNOSIS — M25561 Pain in right knee: Secondary | ICD-10-CM | POA: Diagnosis not present

## 2019-05-18 DIAGNOSIS — E876 Hypokalemia: Secondary | ICD-10-CM | POA: Diagnosis not present

## 2019-05-18 DIAGNOSIS — N390 Urinary tract infection, site not specified: Secondary | ICD-10-CM | POA: Diagnosis not present

## 2019-05-25 DIAGNOSIS — M25561 Pain in right knee: Secondary | ICD-10-CM | POA: Diagnosis not present

## 2019-05-25 DIAGNOSIS — M25562 Pain in left knee: Secondary | ICD-10-CM | POA: Diagnosis not present

## 2019-06-01 DIAGNOSIS — M25562 Pain in left knee: Secondary | ICD-10-CM | POA: Diagnosis not present

## 2019-06-01 DIAGNOSIS — M25561 Pain in right knee: Secondary | ICD-10-CM | POA: Diagnosis not present

## 2019-06-02 DIAGNOSIS — E663 Overweight: Secondary | ICD-10-CM | POA: Diagnosis not present

## 2019-06-02 DIAGNOSIS — M0609 Rheumatoid arthritis without rheumatoid factor, multiple sites: Secondary | ICD-10-CM | POA: Diagnosis not present

## 2019-06-02 DIAGNOSIS — Z6827 Body mass index (BMI) 27.0-27.9, adult: Secondary | ICD-10-CM | POA: Diagnosis not present

## 2019-06-02 DIAGNOSIS — M150 Primary generalized (osteo)arthritis: Secondary | ICD-10-CM | POA: Diagnosis not present

## 2019-06-02 DIAGNOSIS — M2550 Pain in unspecified joint: Secondary | ICD-10-CM | POA: Diagnosis not present

## 2019-06-04 DIAGNOSIS — M25562 Pain in left knee: Secondary | ICD-10-CM | POA: Diagnosis not present

## 2019-06-04 DIAGNOSIS — M25561 Pain in right knee: Secondary | ICD-10-CM | POA: Diagnosis not present

## 2019-06-08 DIAGNOSIS — M25561 Pain in right knee: Secondary | ICD-10-CM | POA: Diagnosis not present

## 2019-06-08 DIAGNOSIS — M25562 Pain in left knee: Secondary | ICD-10-CM | POA: Diagnosis not present

## 2019-06-11 DIAGNOSIS — M25561 Pain in right knee: Secondary | ICD-10-CM | POA: Diagnosis not present

## 2019-06-11 DIAGNOSIS — M25562 Pain in left knee: Secondary | ICD-10-CM | POA: Diagnosis not present

## 2019-06-14 DIAGNOSIS — M25562 Pain in left knee: Secondary | ICD-10-CM | POA: Diagnosis not present

## 2019-06-14 DIAGNOSIS — M25561 Pain in right knee: Secondary | ICD-10-CM | POA: Diagnosis not present

## 2019-06-17 DIAGNOSIS — M25562 Pain in left knee: Secondary | ICD-10-CM | POA: Diagnosis not present

## 2019-06-17 DIAGNOSIS — M0609 Rheumatoid arthritis without rheumatoid factor, multiple sites: Secondary | ICD-10-CM | POA: Diagnosis not present

## 2019-06-17 DIAGNOSIS — M25561 Pain in right knee: Secondary | ICD-10-CM | POA: Diagnosis not present

## 2019-06-21 DIAGNOSIS — H401390 Pigmentary glaucoma, unspecified eye, stage unspecified: Secondary | ICD-10-CM | POA: Diagnosis not present

## 2019-06-21 DIAGNOSIS — E7800 Pure hypercholesterolemia, unspecified: Secondary | ICD-10-CM | POA: Diagnosis not present

## 2019-06-21 DIAGNOSIS — I10 Essential (primary) hypertension: Secondary | ICD-10-CM | POA: Diagnosis not present

## 2019-06-21 DIAGNOSIS — M1990 Unspecified osteoarthritis, unspecified site: Secondary | ICD-10-CM | POA: Diagnosis not present

## 2019-06-21 DIAGNOSIS — E1122 Type 2 diabetes mellitus with diabetic chronic kidney disease: Secondary | ICD-10-CM | POA: Diagnosis not present

## 2019-06-21 DIAGNOSIS — I2510 Atherosclerotic heart disease of native coronary artery without angina pectoris: Secondary | ICD-10-CM | POA: Diagnosis not present

## 2019-06-21 DIAGNOSIS — N183 Chronic kidney disease, stage 3 unspecified: Secondary | ICD-10-CM | POA: Diagnosis not present

## 2019-06-22 DIAGNOSIS — M25562 Pain in left knee: Secondary | ICD-10-CM | POA: Diagnosis not present

## 2019-06-22 DIAGNOSIS — M25561 Pain in right knee: Secondary | ICD-10-CM | POA: Diagnosis not present

## 2019-07-06 ENCOUNTER — Other Ambulatory Visit (HOSPITAL_COMMUNITY): Payer: Self-pay | Admitting: Hematology and Oncology

## 2019-07-06 ENCOUNTER — Other Ambulatory Visit (HOSPITAL_COMMUNITY): Payer: Self-pay | Admitting: Family Medicine

## 2019-07-06 ENCOUNTER — Other Ambulatory Visit: Payer: Self-pay

## 2019-07-06 ENCOUNTER — Other Ambulatory Visit: Payer: Self-pay | Admitting: Family Medicine

## 2019-07-06 ENCOUNTER — Ambulatory Visit (HOSPITAL_COMMUNITY)
Admission: RE | Admit: 2019-07-06 | Discharge: 2019-07-06 | Disposition: A | Discharge status: Home / Self Care | Payer: Medicare Other | Source: Ambulatory Visit | Attending: Family Medicine | Admitting: Family Medicine

## 2019-07-06 DIAGNOSIS — E538 Deficiency of other specified B group vitamins: Secondary | ICD-10-CM | POA: Diagnosis not present

## 2019-07-06 DIAGNOSIS — M7989 Other specified soft tissue disorders: Secondary | ICD-10-CM | POA: Insufficient documentation

## 2019-07-06 DIAGNOSIS — M79604 Pain in right leg: Secondary | ICD-10-CM | POA: Diagnosis not present

## 2019-07-06 DIAGNOSIS — I10 Essential (primary) hypertension: Secondary | ICD-10-CM | POA: Diagnosis not present

## 2019-07-06 DIAGNOSIS — K7581 Nonalcoholic steatohepatitis (NASH): Secondary | ICD-10-CM | POA: Diagnosis not present

## 2019-07-06 DIAGNOSIS — E7800 Pure hypercholesterolemia, unspecified: Secondary | ICD-10-CM | POA: Diagnosis not present

## 2019-07-06 DIAGNOSIS — E1122 Type 2 diabetes mellitus with diabetic chronic kidney disease: Secondary | ICD-10-CM | POA: Diagnosis not present

## 2019-07-06 DIAGNOSIS — N183 Chronic kidney disease, stage 3 unspecified: Secondary | ICD-10-CM | POA: Diagnosis not present

## 2019-07-06 DIAGNOSIS — R609 Edema, unspecified: Secondary | ICD-10-CM | POA: Diagnosis not present

## 2019-07-06 DIAGNOSIS — E559 Vitamin D deficiency, unspecified: Secondary | ICD-10-CM | POA: Diagnosis not present

## 2019-07-06 DIAGNOSIS — F418 Other specified anxiety disorders: Secondary | ICD-10-CM | POA: Diagnosis not present

## 2019-07-06 DIAGNOSIS — M79669 Pain in unspecified lower leg: Secondary | ICD-10-CM | POA: Diagnosis not present

## 2019-07-06 NOTE — Progress Notes (Signed)
Lower extremity venous has been completed.   Preliminary results in CV Proc.   Abram Sander 07/06/2019 3:09 PM

## 2019-08-11 ENCOUNTER — Ambulatory Visit (INDEPENDENT_AMBULATORY_CARE_PROVIDER_SITE_OTHER): Payer: Medicare Other | Admitting: Internal Medicine

## 2019-08-11 ENCOUNTER — Encounter: Payer: Self-pay | Admitting: Internal Medicine

## 2019-08-11 ENCOUNTER — Other Ambulatory Visit: Payer: Self-pay

## 2019-08-11 VITALS — BP 126/60 | HR 80 | Ht 66.0 in | Wt 167.0 lb

## 2019-08-11 DIAGNOSIS — E1165 Type 2 diabetes mellitus with hyperglycemia: Secondary | ICD-10-CM | POA: Diagnosis not present

## 2019-08-11 DIAGNOSIS — E663 Overweight: Secondary | ICD-10-CM | POA: Diagnosis not present

## 2019-08-11 DIAGNOSIS — E785 Hyperlipidemia, unspecified: Secondary | ICD-10-CM

## 2019-08-11 LAB — POCT GLYCOSYLATED HEMOGLOBIN (HGB A1C): Hemoglobin A1C: 6.7 % — AB (ref 4.0–5.6)

## 2019-08-11 MED ORDER — GLIPIZIDE ER 2.5 MG PO TB24: 2.5000 mg | ORAL_TABLET | Freq: Every day | ORAL | 3 refills | Status: DC

## 2019-08-11 NOTE — Addendum Note (Signed)
Addended by: Cardell Peach I on: 08/11/2019 11:32 AM   Modules accepted: Orders

## 2019-08-11 NOTE — Patient Instructions (Addendum)
Please start: - Glipizide XL 2.5 mg daily before b'fast  Check sugars once a day, rotating check times.  Please return in 4 months with your sugar log.

## 2019-08-11 NOTE — Progress Notes (Signed)
Patient ID: [name], female   DOB: 1945/10/06, 74 y.o.   MRN: [id number]   This visit occurred during the SARS-CoV-2 public health emergency.  Safety protocols were in place, including screening questions prior to the visit, additional usage of staff PPE, and extensive cleaning of exam room while observing appropriate contact time as indicated for disinfecting solutions.   HPI: Tammy Pineda is a 74 y.o.-year-old female, initially referred by her PCP, Dr. Drema Pineda, returning for follow-up for DM2, dx in "several years ago", non-insulin-dependent, uncontrolled, with complications (CAD, CKD stage III, NASH). Tammy Pineda, her husband, is also my pt. Last visit 3.5 months ago.  She has a lot of stress at home with her husband having multiple medical problems and her sister having cognitive impairment and having gone through spine surgery.  Before last visit, she started another NASH study.  She was previously in another study that was using a drug which helped her significantly (HbA1c dropped to 6.2%), however, the study was stopped and the drug is not available.  She felt that the medication used in the new study was increasing her blood sugars and also her weight.  Reviewed HbA1c levels: Lab Results  Component Value Date   HGBA1C 6.9 (A) 05/03/2019   HGBA1C 6.4 (A) 02/08/2019   HGBA1C 5.9 10/08/2018   HGBA1C 6.2 (A) 08/04/2018  02/26/2018: HbA1c 6.5% 09/20/2017: HbA1c 7.3% 04/18/2017: HbA1c 7.6%  She was on: - Metformin 1000 mg 2x a day, with meals -added 11/2017 - had significant diarrhea >> metformin ER 1000 mg 2x a day which she was tolerating well, however, she then started to have severe diarrhea >> she stopped in 09/2018.  At last visit she was off diabetic medications -I advised him to start [place], but this was too expensive.  She is now off diabetic medications.  Pt checks her sugars once a day: - am:  92, 112-142, 150, 159 >> 128-157, 163, 220, 232 >> 119-158, 164, 181, 198 -  Prednisone - 2h after b'fast: n/c >> 97-139, 170 >> n/c >> 66, 150-241 >> n/c - before lunch: n/c >> 106-121 >> 119 >> 86 >> 68 >> n/c - 2h after lunch: n/c >> 106-139, 151 >> n/c >>  119, 150 >> n/c >> 164 - before dinner: 85-126 >> n/c >> 68 (Glipizide), 75-124 >> 168 >> n/c  - 2h after dinner: n/c >> 77-127 >> 96, 129 >> 73, 165 >> n/c >> 96, 108-181 - bedtime: n/c >> 113, 117 >> n/c - nighttime: n/c Lowest sugar was 77 >> 96 >> 66; it is unclear at which level she has hypoglycemia awareness. Highest sugar was 188 (correction of 68) >> 246.  Glucometer: One Touch variable  Pt's meals are: - Breakfast: eggs, cereal + fruit, egg waffles and sugar-free syrup  - Lunch: salad, grilled chicken - Dinner: baked meat, veggies, starch  - Snacks: 1-2x - at bedtime - granola  In the past, she was exercising at [employer] and also dancing 2-3 times a week.  However, she stopped due to the coronavirus pandemic.  -+ CKD stage III, last BUN/creatinine:  Lab Results  Component Value Date   BUN 33 (H) 05/16/2019   BUN 24 02/08/2019   CREATININE 0.93 05/16/2019   CREATININE 1.06 (H) 02/08/2019  02/26/2018: 23/0.94 On losartan 50  -+ HL; last set of lipids: Lab Results  Component Value Date   CHOL 160 02/08/2019   HDL 49.90 02/08/2019   LDLCALC 70 12/22/2009   LDLDIRECT  66.0 02/08/2019   TRIG 257.0 (H) 02/08/2019   CHOLHDL 3 02/08/2019  On Crestor 5.  - last eye exam was in 04/2019: + Glaucoma, no DR. Dr. Katy Pineda. Had cataract sx.  - no numbness and tingling in her feet.  Pt has FH of DM in father and brother.   She also has a history of HTN-on Lasix as needed, IBS with diarrhea, GERD, NASH with stage 2 fibrosis- in a study, vaginal prolapse - h/o surgery.  She is on Remicade for inflammatory arthritis.  ROS: Constitutional: no weight gain/+ weight loss, no fatigue, no subjective hyperthermia, no subjective hypothermia Eyes: no blurry vision, no xerophthalmia ENT: no sore  throat, no nodules palpated in neck, no dysphagia, no odynophagia, no hoarseness Cardiovascular: no CP/no SOB/no palpitations/no leg swelling Respiratory: no cough/no SOB/no wheezing Gastrointestinal: no N/no V/no D/no C/no acid reflux Musculoskeletal: no muscle aches/no joint aches Skin: no rashes, no hair loss Neurological: no tremors/no numbness/no tingling/no dizziness  I reviewed pt's medications, allergies, PMH, social hx, family hx, and changes were documented in the history of present illness. Otherwise, unchanged from my initial visit note.  Past Medical History:  Diagnosis Date  . Anxiety   . Arthritis   . Coronary artery disease    nonobstructive by cardiac catheterization 2011 with a 30% LAD lesion  . Diabetes ([place])    Type 2  . GERD (gastroesophageal reflux disease)   . Glaucoma   . Hypercholesteremia   . Hypertension   . PVC (premature ventricular contraction)   . Seasonal allergies    Past Surgical History:  Procedure Laterality Date  . APPENDECTOMY  1959  . BUNIONECTOMY    . CARDIAC CATHETERIZATION  2011   clean cath  . CARDIOVASCULAR STRESS TEST    . CATARACT EXTRACTION W/ INTRAOCULAR LENS IMPLANT Right 2010  . KNEE ARTHROSCOPY Left   . NASAL RECONSTRUCTION  1976  . SHOULDER ARTHROSCOPY Left   . TOTAL ABDOMINAL HYSTERECTOMY  1976   Social History   Socioeconomic History  . Marital status: Married    Spouse name: Not on file  . Number of children: 1  . Years of education: Not on file  . Highest education level: Not on file  Occupational History  . Occupation: Therapist, sports - retired    Fish farm manager: [employer]     Comment: [place]  Tobacco Use  . Smoking status: Never Smoker  . Smokeless tobacco: Never Used  Substance and Sexual Activity  . Alcohol use: No  . Drug use: No   Current Outpatient Medications on File Prior to Visit  Medication Sig Dispense Refill  . brimonidine (ALPHAGAN) 0.2 % ophthalmic solution Place 1 drop into both eyes 2 (two) times  daily.    . Cholecalciferol (VITAMIN D) 2000 UNITS CAPS Take 2,000 Units by mouth at bedtime.     Marland Kitchen CRANBERRY PO Take 1 tablet by mouth daily.    . dapagliflozin propanediol (FARXIGA) 5 MG TABS tablet Take 5 mg by mouth daily before breakfast. (Patient not taking: Reported on 05/16/2019) 30 tablet 11  . escitalopram (LEXAPRO) 10 MG tablet Take 10 mg by mouth at bedtime.     . hydrochlorothiazide (MICROZIDE) 12.5 MG capsule Take 12.5 mg by mouth daily.    Marland Kitchen HYDROcodone-acetaminophen (NORCO/VICODIN) 5-325 MG tablet Take 1 tablet by mouth every 6 (six) hours as needed. (Patient taking differently: Take 1 tablet by mouth every 6 (six) hours as needed for moderate pain. ) 6 tablet 0  . ibuprofen (  ADVIL,MOTRIN) 200 MG tablet Take 400-800 mg by mouth daily as needed for moderate pain.    Marland Kitchen inFLIXimab (REMICADE) 100 MG injection Inject 100 mg into the vein every 6 (six) weeks.    Marland Kitchen latanoprost (XALATAN) 0.005 % ophthalmic solution Place 1 drop into both eyes at bedtime.    Marland Kitchen LORazepam (ATIVAN) 0.5 MG tablet Take 1 tablet (0.5 mg total) by mouth every 8 (eight) hours as needed (nausea / dizziness / emesis). Do not take this medication with narcotic pain medication at the same time. 20 tablet 0  . losartan (COZAAR) 100 MG tablet Take 100 mg by mouth daily.    . meclizine (ANTIVERT) 12.5 MG tablet Take 1 tablet (12.5 mg total) by mouth 3 (three) times daily as needed for dizziness. 21 tablet 0  . Misc Natural Products (TURMERIC CURCUMIN) CAPS Take 1 capsule by mouth daily.    Marland Kitchen omeprazole (PRILOSEC) 20 MG capsule Take 20 mg by mouth daily.    . rosuvastatin (CRESTOR) 5 MG tablet Take 5 mg by mouth daily.    . timolol (BETIMOL) 0.5 % ophthalmic solution Place 1 drop into both eyes daily.     No current facility-administered medications on file prior to visit.   Also, Metformin 1000 mg 2x a day.  Allergies  Allergen Reactions  . Metformin And Related Diarrhea  . Gabapentin Palpitations  .  Sulfamethoxazole-Trimethoprim Rash  . Tape Rash   Family History  Problem Relation Age of Onset  . Heart disease Father        open heart surgery at 22  . Colon cancer Father   . Heart disease Mother        open heart surgery at age 76  . Hypertension Mother   . Heart attack Maternal Grandfather   . Stroke Paternal Grandmother   . Heart block Brother     PE: BP 126/60   Pulse 80   Ht 5' 6"  (1.676 m)   Wt 167 lb (75.8 kg)   SpO2 96%   BMI 26.95 kg/m   Wt Readings from Last 3 Encounters:  08/11/19 167 lb (75.8 kg)  05/03/19 174 lb (78.9 kg)  02/08/19 166 lb (75.3 kg)   Constitutional: overweight, in NAD Eyes: PERRLA, EOMI, no exophthalmos ENT: moist mucous membranes, no thyromegaly, no cervical lymphadenopathy Cardiovascular: RRR, No MRG Respiratory: CTA B Gastrointestinal: abdomen soft, NT, ND, BS+ Musculoskeletal: +3 degrees finger deformities, strength intact in all 4 Skin: moist, warm, no rashes Neurological: no tremor with outstretched hands, DTR normal in all 4  ASSESSMENT: 1. DM2, non-insulin-dependent, uncontrolled, with long-term complications - CAD - CKD stage III - NASH  2. HL  3. Overweight  PLAN:  1. Patient with longstanding, previously uncontrolled type 2 diabetes, with improved control before 2020, but worsening control after the coronavirus pandemic. -She had severe diarrhea from Metformin ER in the past and had to stop.  Her diarrhea resolved after stopping Metformin.  HbA1c was higher at last visit, at 6.9% and also her sugars documented in her log are higher than before, with few lower blood sugars in the 60s.  I suggested an SGLT2 inhibitor and tried to start Farxiga 5 mg daily.  However, this was not affordable.  We discussed about paying close attention to her diet but staying off diabetic medicines while monitoring her sugars.   -At this visit, sugars are mostly at goal, but she has some hyperglycemic spikes.  She mentions that she has been  very careful with  her diet with 3 exceptions, but would like to start the medication for diabetes that allows her to control her blood sugars better.  She has been using her husband's glipizide occasionally and she tolerated this well.  She agrees to try extended release glipizide in the morning before breakfast. -We discussed that she can also use this before a larger meal if she crushes it first -she is currently taking at 2.5 mg dose of glipizide occasionally before a large meal. - I suggested to:  Patient Instructions  Please start: - Glipizide XL 2.5 mg daily before b'fast  Check sugars once a day, rotating check times.  Please return in 4 months with your sugar log.   - advised to check sugars at different times of the day - 1x a day, rotating check times - advised for yearly eye exams >> she is UTD - return to clinic in 4 months  2. HL -Reviewed latest lipid panel from 02/2019: LDL at goal, triglycerides high: Lab Results  Component Value Date   CHOL 160 02/08/2019   HDL 49.90 02/08/2019   LDLCALC 70 12/22/2009   LDLDIRECT 66.0 02/08/2019   TRIG 257.0 (H) 02/08/2019   CHOLHDL 3 02/08/2019  -Continues Crestor without side effects  3. Overweight -At last visit, she gained 14 pounds from the previous visit; she lost 7 more pounds since then -We tried to start a SGLT2 inhibitor which should also have helped with weight loss -however, this was not affordable -Discussed about the importance of continuing to control her diet -made specific suggestions.  Philemon Kingdom, MD PhD [facility] Endocrin[facility]

## 2019-08-17 DIAGNOSIS — N3000 Acute cystitis without hematuria: Secondary | ICD-10-CM | POA: Diagnosis not present

## 2019-09-03 DIAGNOSIS — N390 Urinary tract infection, site not specified: Secondary | ICD-10-CM | POA: Diagnosis not present

## 2019-09-17 DIAGNOSIS — M545 Low back pain: Secondary | ICD-10-CM | POA: Diagnosis not present

## 2019-09-20 DIAGNOSIS — M0609 Rheumatoid arthritis without rheumatoid factor, multiple sites: Secondary | ICD-10-CM | POA: Diagnosis not present

## 2019-09-21 DIAGNOSIS — R300 Dysuria: Secondary | ICD-10-CM | POA: Diagnosis not present

## 2019-09-21 DIAGNOSIS — N644 Mastodynia: Secondary | ICD-10-CM | POA: Diagnosis not present

## 2019-10-04 DIAGNOSIS — M545 Low back pain: Secondary | ICD-10-CM | POA: Diagnosis not present

## 2019-10-12 DIAGNOSIS — M545 Low back pain: Secondary | ICD-10-CM | POA: Diagnosis not present

## 2019-10-14 DIAGNOSIS — N390 Urinary tract infection, site not specified: Secondary | ICD-10-CM | POA: Diagnosis not present

## 2019-10-21 DIAGNOSIS — M25561 Pain in right knee: Secondary | ICD-10-CM | POA: Diagnosis not present

## 2019-10-21 DIAGNOSIS — M1712 Unilateral primary osteoarthritis, left knee: Secondary | ICD-10-CM | POA: Diagnosis not present

## 2019-10-27 DIAGNOSIS — E119 Type 2 diabetes mellitus without complications: Secondary | ICD-10-CM | POA: Diagnosis not present

## 2019-10-27 DIAGNOSIS — H401213 Low-tension glaucoma, right eye, severe stage: Secondary | ICD-10-CM | POA: Diagnosis not present

## 2019-10-27 DIAGNOSIS — H401222 Low-tension glaucoma, left eye, moderate stage: Secondary | ICD-10-CM | POA: Diagnosis not present

## 2019-10-27 DIAGNOSIS — H16223 Keratoconjunctivitis sicca, not specified as Sjogren's, bilateral: Secondary | ICD-10-CM | POA: Diagnosis not present

## 2019-10-27 DIAGNOSIS — Z961 Presence of intraocular lens: Secondary | ICD-10-CM | POA: Diagnosis not present

## 2019-11-01 DIAGNOSIS — M0609 Rheumatoid arthritis without rheumatoid factor, multiple sites: Secondary | ICD-10-CM | POA: Diagnosis not present

## 2019-11-09 DIAGNOSIS — Z20822 Contact with and (suspected) exposure to covid-19: Secondary | ICD-10-CM | POA: Diagnosis not present

## 2019-11-17 DIAGNOSIS — Z23 Encounter for immunization: Secondary | ICD-10-CM | POA: Diagnosis not present

## 2019-11-25 DIAGNOSIS — E7800 Pure hypercholesterolemia, unspecified: Secondary | ICD-10-CM | POA: Diagnosis not present

## 2019-11-25 DIAGNOSIS — Z01818 Encounter for other preprocedural examination: Secondary | ICD-10-CM | POA: Diagnosis not present

## 2019-11-25 DIAGNOSIS — R0989 Other specified symptoms and signs involving the circulatory and respiratory systems: Secondary | ICD-10-CM | POA: Diagnosis not present

## 2019-11-25 DIAGNOSIS — I10 Essential (primary) hypertension: Secondary | ICD-10-CM | POA: Diagnosis not present

## 2019-11-25 DIAGNOSIS — E538 Deficiency of other specified B group vitamins: Secondary | ICD-10-CM | POA: Diagnosis not present

## 2019-11-25 DIAGNOSIS — I2510 Atherosclerotic heart disease of native coronary artery without angina pectoris: Secondary | ICD-10-CM | POA: Diagnosis not present

## 2019-11-25 DIAGNOSIS — E1122 Type 2 diabetes mellitus with diabetic chronic kidney disease: Secondary | ICD-10-CM | POA: Diagnosis not present

## 2019-11-29 ENCOUNTER — Other Ambulatory Visit (HOSPITAL_COMMUNITY): Payer: Self-pay | Admitting: Family Medicine

## 2019-11-29 DIAGNOSIS — R0989 Other specified symptoms and signs involving the circulatory and respiratory systems: Secondary | ICD-10-CM

## 2019-12-02 ENCOUNTER — Other Ambulatory Visit: Payer: Self-pay

## 2019-12-02 ENCOUNTER — Ambulatory Visit (HOSPITAL_COMMUNITY)
Admission: RE | Admit: 2019-12-02 | Discharge: 2019-12-02 | Disposition: A | Discharge status: Home / Self Care | Payer: Medicare Other | Source: Ambulatory Visit | Attending: Family Medicine | Admitting: Family Medicine

## 2019-12-02 ENCOUNTER — Telehealth: Payer: Self-pay

## 2019-12-02 DIAGNOSIS — M2550 Pain in unspecified joint: Secondary | ICD-10-CM | POA: Diagnosis not present

## 2019-12-02 DIAGNOSIS — M0609 Rheumatoid arthritis without rheumatoid factor, multiple sites: Secondary | ICD-10-CM | POA: Diagnosis not present

## 2019-12-02 DIAGNOSIS — R0989 Other specified symptoms and signs involving the circulatory and respiratory systems: Secondary | ICD-10-CM | POA: Diagnosis not present

## 2019-12-02 DIAGNOSIS — M150 Primary generalized (osteo)arthritis: Secondary | ICD-10-CM | POA: Diagnosis not present

## 2019-12-02 DIAGNOSIS — Z6828 Body mass index (BMI) 28.0-28.9, adult: Secondary | ICD-10-CM | POA: Diagnosis not present

## 2019-12-02 DIAGNOSIS — E663 Overweight: Secondary | ICD-10-CM | POA: Diagnosis not present

## 2019-12-02 DIAGNOSIS — Z79899 Other long term (current) drug therapy: Secondary | ICD-10-CM | POA: Diagnosis not present

## 2019-12-02 DIAGNOSIS — K760 Fatty (change of) liver, not elsewhere classified: Secondary | ICD-10-CM | POA: Diagnosis not present

## 2019-12-02 NOTE — Telephone Encounter (Signed)
   Primary Cardiologist: Remotely seen by Dr. Gwenlyn Found in 2016  Chart reviewed as part of pre-operative protocol coverage. Because of Tammy Pineda past medical history and time since last visit, she will require a follow-up visit in order to better assess preoperative cardiovascular risk.  Pre-op covering staff: - Patient is scheduled to see Dr. Margaretann Loveless 12/10/19. Please add "pre-op clearance" to the appointment notes so provider is aware. - Please contact requesting surgeon's office via preferred method (i.e, phone, fax) to inform them of need for appointment prior to surgery.  [name], PA-C  12/02/2019, 3:03 PM

## 2019-12-02 NOTE — Telephone Encounter (Signed)
   [facility] Pre-operative Risk Assessment    HEARTCARE STAFF: - Please ensure there is not already an duplicate clearance open for this procedure. - Under Visit Info/Reason for Call, type in Other and utilize the format Clearance MM/DD/YY or Clearance TBD. Do not use dashes or single digits. - If request is for dental extraction, please clarify the # of teeth to be extracted.  Request for surgical clearance:  1. What type of surgery is being performed? Left Total Knee Arthoplasty   2. When is this surgery scheduled? January 03, 2020   3. What type of clearance is required (medical clearance vs. Pharmacy clearance to hold med vs. Both)? Medical  4. Are there any medications that need to be held prior to surgery and how long? None  5. Practice name and name of physician performing surgery? [facility] Jannifer Franklin MD  6. What is the office phone number? [phone]   7.   What is the office fax number? [phone]  8.   Anesthesia type (None, local, MAC, general) ? Choice   Monia Pouch 12/02/2019, 9:51 AM  _________________________________________________________________   (provider comments below)

## 2019-12-02 NOTE — Progress Notes (Signed)
Carotid artery duplex completed. Refer to "CV Proc" under chart review to view preliminary results.  12/02/2019 2:35 PM Kelby Aline., MHA, RVT, RDCS, RDMS

## 2019-12-10 ENCOUNTER — Encounter: Payer: Self-pay | Admitting: Internal Medicine

## 2019-12-10 ENCOUNTER — Other Ambulatory Visit: Payer: Self-pay

## 2019-12-10 ENCOUNTER — Ambulatory Visit (INDEPENDENT_AMBULATORY_CARE_PROVIDER_SITE_OTHER): Payer: Medicare Other | Admitting: Internal Medicine

## 2019-12-10 VITALS — BP 132/60 | HR 63 | Ht 66.0 in | Wt 176.2 lb

## 2019-12-10 DIAGNOSIS — Z01810 Encounter for preprocedural cardiovascular examination: Secondary | ICD-10-CM

## 2019-12-10 DIAGNOSIS — Z01818 Encounter for other preprocedural examination: Secondary | ICD-10-CM | POA: Diagnosis not present

## 2019-12-10 DIAGNOSIS — G4733 Obstructive sleep apnea (adult) (pediatric): Secondary | ICD-10-CM | POA: Diagnosis not present

## 2019-12-10 DIAGNOSIS — E785 Hyperlipidemia, unspecified: Secondary | ICD-10-CM | POA: Diagnosis not present

## 2019-12-10 DIAGNOSIS — I10 Essential (primary) hypertension: Secondary | ICD-10-CM | POA: Diagnosis not present

## 2019-12-10 DIAGNOSIS — E1165 Type 2 diabetes mellitus with hyperglycemia: Secondary | ICD-10-CM | POA: Diagnosis not present

## 2019-12-10 DIAGNOSIS — I2510 Atherosclerotic heart disease of native coronary artery without angina pectoris: Secondary | ICD-10-CM

## 2019-12-10 NOTE — Patient Instructions (Signed)
Medication Instructions:  No Changes In Medications at this time.  *If you need a refill on your cardiac medications before your next appointment, please call your pharmacy*  Lab Work: None Ordered At This Time.  If you have labs (blood work) drawn today and your tests are completely normal, you will receive your results only by: Marland Kitchen MyChart Message (if you have MyChart) OR . A paper copy in the mail If you have any lab test that is abnormal or we need to change your treatment, we will call you to review the results.  Testing/Procedures: None Ordered At This Time.   Follow-Up: At [facility], you and your health needs are our priority.  As part of our continuing mission to provide you with exceptional heart care, we have created designated Provider Care Teams.  These Care Teams include your primary Cardiologist (physician) and Advanced Practice Providers (APPs -  Physician Assistants and Nurse Practitioners) who all work together to provide you with the care you need, when you need it.  Your next appointment:   6 month(s)  The format for your next appointment:   In Person  Provider:   Cherlynn Kaiser, MD

## 2019-12-10 NOTE — Progress Notes (Signed)
Cardiology Office Note:    Date:  12/10/2019   ID:  Tammy Pineda, DOB 07-14-1945, MRN [id number]  PCP:  Leighton Ruff, MD  Cardiologist:  Elouise Munroe, MD  Electrophysiologist:  None   Referring MD: Leighton Ruff, MD   Chief Complaint/Reason for Referral: Preoperative cardiovascular risk stratification  History of Present Illness:    Tammy Pineda is a 74 y.o. female with a history of arthritis, multiple prior surgeries with no issues with general anesthesia. CV history includes nonobstructive CAD by cath in 2011, performed for positive stress echo, by Dr. Eustace Quail. Minimal CAD with at most 30-40% LAD.   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   She has recently been asymptomatic. Occasional palpitations which she thinks are PACs. She denies chest pain, chest pressure, dyspnea at rest or with exertion, PND, orthopnea, or leg swelling. Denies cough, fever, chills. Denies nausea, vomiting. Denies syncope or presyncope. Denies dizziness or lightheadedness.  No known anesthesia complications. Can complete >4 METS without symptoms. Risk factors included DM2 with A1C of 6.5% on oral agents, HTN well controlled on HCTZ and losartan, HLD well controlled on Crestor, and inflammatory arthritis on remicade.  Past Medical History:  Diagnosis Date  . Anxiety   . Arthritis   . Coronary artery disease    nonobstructive by cardiac catheterization 2011 with a 30% LAD lesion  . Diabetes ([place])    Type 2  . GERD (gastroesophageal reflux disease)   . Glaucoma   . Hypercholesteremia   . Hypertension   . PVC (premature ventricular contraction)   . Seasonal allergies     Past Surgical History:  Procedure Laterality Date  . APPENDECTOMY  1959  . BUNIONECTOMY    . CARDIAC CATHETERIZATION  2011   clean cath  . CARDIOVASCULAR STRESS TEST    . CATARACT EXTRACTION W/ INTRAOCULAR LENS IMPLANT Right 2010  . KNEE ARTHROSCOPY Left   . NASAL  RECONSTRUCTION  1976  . SHOULDER ARTHROSCOPY Left   . TOTAL ABDOMINAL HYSTERECTOMY  1976    Current Medications: Current Meds  Medication Sig  . brimonidine (ALPHAGAN) 0.2 % ophthalmic solution Place 1 drop into both eyes 2 (two) times daily.  . Cholecalciferol (VITAMIN D) 2000 UNITS CAPS Take 2,000 Units by mouth at bedtime.   Marland Kitchen CRANBERRY PO Take 1 tablet by mouth daily.  Marland Kitchen escitalopram (LEXAPRO) 10 MG tablet Take 10 mg by mouth at bedtime.   Marland Kitchen glipiZIDE (GLUCOTROL XL) 2.5 MG 24 hr tablet Take 1 tablet (2.5 mg total) by mouth daily before breakfast.  . hydrochlorothiazide (MICROZIDE) 12.5 MG capsule Take 12.5 mg by mouth daily.  Marland Kitchen HYDROcodone-acetaminophen (NORCO/VICODIN) 5-325 MG tablet Take 1 tablet by mouth every 6 (six) hours as needed.  Marland Kitchen ibuprofen (ADVIL,MOTRIN) 200 MG tablet Take 400-800 mg by mouth daily as needed for moderate pain.  Marland Kitchen inFLIXimab (REMICADE) 100 MG injection Inject 100 mg into the vein every 6 (six) weeks.  Marland Kitchen latanoprost (XALATAN) 0.005 % ophthalmic solution Place 1 drop into both eyes at bedtime.  Marland Kitchen losartan (COZAAR) 100 MG tablet Take 100 mg by mouth daily.  . meclizine (ANTIVERT) 12.5 MG tablet Take 1 tablet (12.5 mg total) by mouth 3 (three) times daily as needed for dizziness.  . Misc Natural Products (TURMERIC CURCUMIN) CAPS Take 1 capsule by mouth daily.  Marland Kitchen omeprazole (PRILOSEC) 20 MG capsule Take 20 mg by mouth daily. Pt takes as needed.  . rosuvastatin (CRESTOR) 5  MG tablet Take 5 mg by mouth daily.  . timolol (BETIMOL) 0.5 % ophthalmic solution Place 1 drop into both eyes daily.  . vitamin B-12 (CYANOCOBALAMIN) 1000 MCG tablet Take 1,000 mcg by mouth daily.     Allergies:   Metformin and related, Gabapentin, Sulfamethoxazole-trimethoprim, and Tape   Social History   Tobacco Use  . Smoking status: Never Smoker  . Smokeless tobacco: Never Used  Substance Use Topics  . Alcohol use: No  . Drug use: No     Family History: The patient's family  history includes Colon cancer in her father; Heart attack in her maternal grandfather; Heart block in her brother; Heart disease in her father and mother; Hypertension in her mother; Stroke in her paternal grandmother.  ROS:   Please see the history of present illness.    All other systems reviewed and are negative.  EKGs/Labs/Other Studies Reviewed:    The following studies were reviewed today:  EKG:  NSR, rate 63 bpm  Recent Labs: 02/08/2019: ALT 55 05/16/2019: BUN 33; Creatinine, Ser 0.93; Hemoglobin 13.4; Platelets 210; Potassium 3.4; Sodium 138  Recent Lipid Panel    Component Value Date/Time   CHOL 160 02/08/2019 1349   TRIG 257.0 (H) 02/08/2019 1349   HDL 49.90 02/08/2019 1349   CHOLHDL 3 02/08/2019 1349   VLDL 51.4 (H) 02/08/2019 1349   LDLCALC 70 12/22/2009 0858   LDLDIRECT 66.0 02/08/2019 1349    Physical Exam:    VS:  BP 132/60 (BP Location: Left Arm, Patient Position: Sitting)   Pulse 63   Ht 5' 6"  (1.676 m)   Wt 176 lb 3.2 oz (79.9 kg)   SpO2 99%   BMI 28.44 kg/m     Wt Readings from Last 5 Encounters:  12/10/19 176 lb 3.2 oz (79.9 kg)  08/11/19 167 lb (75.8 kg)  05/03/19 174 lb (78.9 kg)  02/08/19 166 lb (75.3 kg)  12/27/18 165 lb (74.8 kg)    Constitutional: No acute distress Eyes: sclera non-icteric, normal conjunctiva and lids ENMT: normal dentition, moist mucous membranes Cardiovascular: regular rhythm, normal rate, no murmurs. S1 and S2 normal. Radial pulses normal bilaterally. No jugular venous distention.  Respiratory: clear to auscultation bilaterally GI : normal bowel sounds, soft and nontender. No distention.   MSK: extremities warm, well perfused. No edema.  NEURO: grossly nonfocal exam, moves all extremities. PSYCH: alert and oriented x 3, normal mood and affect.   ASSESSMENT:    1. Pre-op evaluation   2. Preoperative cardiovascular examination   3. Atherosclerosis of native coronary artery of native heart without angina pectoris   4.  Essential hypertension   5. Hyperlipidemia, unspecified hyperlipidemia type   6. Type 2 diabetes mellitus with hyperglycemia, without long-term current use of insulin (HCC)   7. Obstructive sleep apnea    PLAN:    Preoperative cardiovascular examination -Normal ECG, no symptoms with >4 METS.  The patient is low risk for intermediate risk procedure. Risk is felt to be non-modifiable. No further cardiovascular testing is required prior to the procedure.  If this level of risk is acceptable to the patient and surgical team, the patient should be considered optimized from a cardiovascular standpoint. Patient understands the risk stratification, and is willing to proceed.   Atherosclerosis of native coronary artery of native heart without angina pectoris -mild CAD on cath in 2011. Risk factors well controlled and no symptoms. -continue statin  Essential hypertension -continue losartan, and hctz  Hyperlipidemia, unspecified hyperlipidemia type - continue statin  Type 2 diabetes mellitus with hyperglycemia, without long-term current use of insulin ([place]) - per pcp  Cherlynn Kaiser, MD [place]    Medication Adjustments/Labs and Tests Ordered: Current medicines are reviewed at length with the patient today.  Concerns regarding medicines are outlined above.   Orders Placed This Encounter  Procedures  . EKG 12-Lead    No orders of the defined types were placed in this encounter.   Patient Instructions  Medication Instructions:  No Changes In Medications at this time.  *If you need a refill on your cardiac medications before your next appointment, please call your pharmacy*  Lab Work: None Ordered At This Time.  If you have labs (blood work) drawn today and your tests are completely normal, you will receive your results only by: Marland Kitchen MyChart Message (if you have MyChart) OR . A paper copy in the mail If you have any lab test that is abnormal or we need to change  your treatment, we will call you to review the results.  Testing/Procedures: None Ordered At This Time.   Follow-Up: At [facility], you and your health needs are our priority.  As part of our continuing mission to provide you with exceptional heart care, we have created designated Provider Care Teams.  These Care Teams include your primary Cardiologist (physician) and Advanced Practice Providers (APPs -  Physician Assistants and Nurse Practitioners) who all work together to provide you with the care you need, when you need it.  Your next appointment:   6 month(s)  The format for your next appointment:   In Person  Provider:   Cherlynn Kaiser, MD

## 2019-12-13 DIAGNOSIS — M1712 Unilateral primary osteoarthritis, left knee: Secondary | ICD-10-CM | POA: Diagnosis not present

## 2019-12-13 DIAGNOSIS — M25662 Stiffness of left knee, not elsewhere classified: Secondary | ICD-10-CM | POA: Diagnosis not present

## 2019-12-13 DIAGNOSIS — M25562 Pain in left knee: Secondary | ICD-10-CM | POA: Diagnosis not present

## 2019-12-14 DIAGNOSIS — R7989 Other specified abnormal findings of blood chemistry: Secondary | ICD-10-CM | POA: Diagnosis not present

## 2019-12-14 DIAGNOSIS — R748 Abnormal levels of other serum enzymes: Secondary | ICD-10-CM | POA: Diagnosis not present

## 2019-12-14 DIAGNOSIS — M0609 Rheumatoid arthritis without rheumatoid factor, multiple sites: Secondary | ICD-10-CM | POA: Diagnosis not present

## 2019-12-15 DIAGNOSIS — Z85828 Personal history of other malignant neoplasm of skin: Secondary | ICD-10-CM | POA: Diagnosis not present

## 2019-12-15 DIAGNOSIS — L918 Other hypertrophic disorders of the skin: Secondary | ICD-10-CM | POA: Diagnosis not present

## 2019-12-15 DIAGNOSIS — L820 Inflamed seborrheic keratosis: Secondary | ICD-10-CM | POA: Diagnosis not present

## 2019-12-16 ENCOUNTER — Other Ambulatory Visit: Payer: Self-pay

## 2019-12-16 ENCOUNTER — Ambulatory Visit (INDEPENDENT_AMBULATORY_CARE_PROVIDER_SITE_OTHER): Payer: Medicare Other | Admitting: Internal Medicine

## 2019-12-16 ENCOUNTER — Encounter: Payer: Self-pay | Admitting: Internal Medicine

## 2019-12-16 VITALS — BP 120/72 | HR 77 | Ht 66.0 in | Wt 173.4 lb

## 2019-12-16 DIAGNOSIS — E785 Hyperlipidemia, unspecified: Secondary | ICD-10-CM | POA: Diagnosis not present

## 2019-12-16 DIAGNOSIS — E1165 Type 2 diabetes mellitus with hyperglycemia: Secondary | ICD-10-CM | POA: Diagnosis not present

## 2019-12-16 DIAGNOSIS — E663 Overweight: Secondary | ICD-10-CM | POA: Diagnosis not present

## 2019-12-16 DIAGNOSIS — I2510 Atherosclerotic heart disease of native coronary artery without angina pectoris: Secondary | ICD-10-CM | POA: Diagnosis not present

## 2019-12-16 NOTE — Progress Notes (Signed)
Patient ID: Tammy Pineda, female   DOB: 12/07/1945, 74 y.o.   MRN: [id number]   This visit occurred during the SARS-CoV-2 public health emergency.  Safety protocols were in place, including screening questions prior to the visit, additional usage of staff PPE, and extensive cleaning of exam room while observing appropriate contact time as indicated for disinfecting solutions.    HPI: Tammy Pineda is a 74 y.o.-year-old female, initially referred by her PCP, Dr. Drema Dallas, returning for follow-up for DM2, dx in "several years ago", non-insulin-dependent, uncontrolled, with complications (CAD, CKD stage III, NASH). [name], her husband, is also my pt. Last visit 4 months ago.  She will have left TKR 01/03/2020.  Afterwards, she will need a right TKR.  She has a lot of stress at home with her husband having multiple medical problems and her sister having cognitive impairment and having gone through spine surgery.  Reviewed HbA1c levels: 11/25/2019: HbA1c 6.5% Lab Results  Component Value Date   HGBA1C 6.7 (A) 08/11/2019   HGBA1C 6.9 (A) 05/03/2019   HGBA1C 6.4 (A) 02/08/2019   HGBA1C 5.9 10/08/2018   HGBA1C 6.2 (A) 08/04/2018  02/26/2018: HbA1c 6.5% 09/20/2017: HbA1c 7.3% 04/18/2017: HbA1c 7.6%  Currently on: - Glipizide XL 2.5 mg before breakfast -started 08/2019  She was on: - Metformin 1000 mg 2x a day, with meals -added 11/2017 - had significant diarrhea >> metformin ER 1000 mg 2x a day which she was tolerating well, however, she then started to have severe diarrhea >> she stopped in 09/2018. We tried [place] in the past but this was too expensive.  At last visit she was off diabetic medications  Pt checks her sugars once a day: - am:128-157, 163, 220, 232 >> 119-158, 164, 181, 198 - Prednisone >> 108, 112-151, 163 - 2h after b'fast:n/c >> 66, 150-241 >> n/c - before lunch: 119 >> 86 >> 68 >> n/c >> 125 - 2h after lunch: 119, 150 >> n/c >> 164 >> n/c - before dinner: 68  (Glipizide), 75-124 >> 168 >> n/c  - 2h after dinner: 73, 165 >> n/c >> 96, 108-181 >> 125, 153 - bedtime: n/c >> 113, 117 >> n/c >> 183 - nighttime: n/c >> 98, 108 Lowest sugar was 77 >> 96 >> 66 >> 90s; it is unclear at which level she has hypoglycemia awareness. Highest sugar was 188 (correction of 68) >> 246.  Glucometer: One Touch verio  Pt's meals are: - Breakfast: eggs, cereal + fruit, egg waffles and sugar-free syrup  - Lunch: salad, grilled chicken - Dinner: baked meat, veggies, starch  - Snacks: 1-2x - at bedtime - granola  In the past, she was exercising at [employer] and also dancing 2-3 times a week.  However, she stopped during the coronavirus pandemic.  -+ CKD stage III, last BUN/creatinine:  12/15/2019: 22/0.97, GFR 57, glucose 142  11/25/2019 27/0.98, GFR 55, glucose 91 Lab Results  Component Value Date   BUN 33 (H) 05/16/2019   BUN 24 02/08/2019   CREATININE 0.93 05/16/2019   CREATININE 1.06 (H) 02/08/2019  02/26/2018: 23/0.94 On losartan 50  -+ HL; last set of lipids: 11/25/2019: 151/268/43/65 Lab Results  Component Value Date   CHOL 160 02/08/2019   HDL 49.90 02/08/2019   LDLCALC 70 12/22/2009   LDLDIRECT 66.0 02/08/2019   TRIG 257.0 (H) 02/08/2019   CHOLHDL 3 02/08/2019  On Crestor 5.  - last eye exam was in 04/2019: + Glaucoma, no DR. Dr. Katy Fitch.  She has  a history of cataract surgery.  - no numbness and tingling in her feet.  Pt has FH of DM in father and brother.   She also has a history of HTN-on Lasix as needed, IBS with diarrhea, GERD, vaginal prolapse - h/o surgery.  She also has NASH with stage 2 fibrosis-currently in a new study.  She was previously in another study that was using a drug which helped her significantly (HbA1c dropped to 6.2%), however, the study was stopped and the drug is not available.   She is on Remicade for inflammatory arthritis.  Vitamin B12 (11/25/2019): 504, normal.  ROS: Constitutional: no weight gain/no  weight loss, no fatigue, no subjective hyperthermia, no subjective hypothermia Eyes: no blurry vision, no xerophthalmia ENT: no sore throat, no nodules palpated in neck, no dysphagia, no odynophagia, no hoarseness Cardiovascular: no CP/no SOB/no palpitations/no leg swelling Respiratory: no cough/no SOB/no wheezing Gastrointestinal: no N/no V/no D/no C/no acid reflux Musculoskeletal: no muscle aches/no joint aches Skin: no rashes, no hair loss Neurological: no tremors/no numbness/no tingling/no dizziness  I reviewed pt's medications, allergies, PMH, social hx, family hx, and changes were documented in the history of present illness. Otherwise, unchanged from my initial visit note.  Past Medical History:  Diagnosis Date  . Anxiety   . Arthritis   . Coronary artery disease    nonobstructive by cardiac catheterization 2011 with a 30% LAD lesion  . Diabetes ([place])    Type 2  . GERD (gastroesophageal reflux disease)   . Glaucoma   . Hypercholesteremia   . Hypertension   . PVC (premature ventricular contraction)   . Seasonal allergies    Past Surgical History:  Procedure Laterality Date  . APPENDECTOMY  1959  . BUNIONECTOMY    . CARDIAC CATHETERIZATION  2011   clean cath  . CARDIOVASCULAR STRESS TEST    . CATARACT EXTRACTION W/ INTRAOCULAR LENS IMPLANT Right 2010  . KNEE ARTHROSCOPY Left   . NASAL RECONSTRUCTION  1976  . SHOULDER ARTHROSCOPY Left   . TOTAL ABDOMINAL HYSTERECTOMY  1976   Social History   Socioeconomic History  . Marital status: Married    Spouse name: Not on file  . Number of children: 1  . Years of education: Not on file  . Highest education level: Not on file  Occupational History  . Occupation: Therapist, sports - retired    Fish farm manager: [employer]     Comment: [employer]  Tobacco Use  . Smoking status: Never Smoker  . Smokeless tobacco: Never Used  Substance and Sexual Activity  . Alcohol use: No  . Drug use: No   Current Outpatient Medications on File Prior to  Visit  Medication Sig Dispense Refill  . brimonidine (ALPHAGAN) 0.2 % ophthalmic solution Place 1 drop into both eyes 2 (two) times daily.    . Cholecalciferol (VITAMIN D) 2000 UNITS CAPS Take 2,000 Units by mouth at bedtime.     Marland Kitchen CRANBERRY PO Take 1 tablet by mouth daily.    Marland Kitchen escitalopram (LEXAPRO) 10 MG tablet Take 10 mg by mouth at bedtime.     Marland Kitchen glipiZIDE (GLUCOTROL XL) 2.5 MG 24 hr tablet Take 1 tablet (2.5 mg total) by mouth daily before breakfast. 90 tablet 3  . hydrochlorothiazide (MICROZIDE) 12.5 MG capsule Take 12.5 mg by mouth daily.    Marland Kitchen HYDROcodone-acetaminophen (NORCO/VICODIN) 5-325 MG tablet Take 1 tablet by mouth every 6 (six) hours as needed. 6 tablet 0  . ibuprofen (ADVIL,MOTRIN) 200 MG tablet Take 400-800  mg by mouth daily as needed for moderate pain.    Marland Kitchen inFLIXimab (REMICADE) 100 MG injection Inject 100 mg into the vein every 6 (six) weeks.    Marland Kitchen latanoprost (XALATAN) 0.005 % ophthalmic solution Place 1 drop into both eyes at bedtime.    Marland Kitchen LORazepam (ATIVAN) 0.5 MG tablet Take 1 tablet (0.5 mg total) by mouth every 8 (eight) hours as needed (nausea / dizziness / emesis). Do not take this medication with narcotic pain medication at the same time. (Patient not taking: Reported on 12/10/2019) 20 tablet 0  . losartan (COZAAR) 100 MG tablet Take 100 mg by mouth daily.    . meclizine (ANTIVERT) 12.5 MG tablet Take 1 tablet (12.5 mg total) by mouth 3 (three) times daily as needed for dizziness. 21 tablet 0  . Misc Natural Products (TURMERIC CURCUMIN) CAPS Take 1 capsule by mouth daily.    Marland Kitchen omeprazole (PRILOSEC) 20 MG capsule Take 20 mg by mouth daily. Pt takes as needed.    . rosuvastatin (CRESTOR) 5 MG tablet Take 5 mg by mouth daily.    . timolol (BETIMOL) 0.5 % ophthalmic solution Place 1 drop into both eyes daily.    . vitamin B-12 (CYANOCOBALAMIN) 1000 MCG tablet Take 1,000 mcg by mouth daily.     No current facility-administered medications on file prior to visit.   Also,  Metformin 1000 mg 2x a day.  Allergies  Allergen Reactions  . Metformin And Related Diarrhea  . Gabapentin Palpitations  . Sulfamethoxazole-Trimethoprim Rash  . Tape Rash   Family History  Problem Relation Age of Onset  . Heart disease Father        open heart surgery at 66  . Colon cancer Father   . Heart disease Mother        open heart surgery at age 24  . Hypertension Mother   . Heart attack Maternal Grandfather   . Stroke Paternal Grandmother   . Heart block Brother     PE: BP 120/72   Pulse 77   Ht 5' 6"  (1.676 m)   Wt 173 lb 6.4 oz (78.7 kg)   SpO2 96%   BMI 27.99 kg/m   Wt Readings from Last 3 Encounters:  12/16/19 173 lb 6.4 oz (78.7 kg)  12/10/19 176 lb 3.2 oz (79.9 kg)  08/11/19 167 lb (75.8 kg)   Constitutional: overweight, in NAD Eyes: PERRLA, EOMI, no exophthalmos ENT: moist mucous membranes, no thyromegaly, no cervical lymphadenopathy Cardiovascular: RRR, No MRG Respiratory: CTA B Gastrointestinal: abdomen soft, NT, ND, BS+ Musculoskeletal: no deformities, strength intact in all 4 Skin: moist, warm, no rashes Neurological: no tremor with outstretched hands, DTR normal in all 4  ASSESSMENT: 1. DM2, non-insulin-dependent, uncontrolled, with long-term complications - CAD - CKD stage III - NASH  2. HL  3. Overweight  PLAN:  1. Patient with longstanding, previously uncontrolled type 2 diabetes, with improved control before the coronavirus pandemic but worsening control after the pandemic started 04/2018.  Previously on metformin ER, but she could not tolerate it due to severe diarrhea.  I suggested an SGLT2 inhibitor and tried to start [place], but this was not affordable.  At last visit, sugars were mostly at goal but with hyperglycemic spikes and we added a low-dose glipizide XL.  At that time she was telling me that she was using occasionally for her husband's glipizide with good tolerance.  HbA1c was 6.7%. -Last month she had another HbA1c  checked by PCP and this was improved,  at 6.5%. -At today's visit, sugars are similar to before, usually after slightly higher than goal in the morning but improved later in the day.  She did have low blood sugars in the past so we need to be careful with her medications.  For now, I advised her to continue the same dose of glipizide but we did discuss that if her sugars are higher during the holidays she may need to double up on the dose. - I suggested to:  Patient Instructions  Please continue: - Glipizide XL 2.5 mg daily before b'fast  Check sugars once a day, rotating check times.  Please return in 4 months with your sugar log.   - advised to check sugars at different times of the day - 1x a day, rotating check times - advised for yearly eye exams >> she is UTD - return to clinic in 4 months  2. HL -Reviewed latest lipid panel from 11/2019: Triglycerides were still high, slightly higher than before, at 268, LDL was at goal, at 65 -Continues Crestor 5 without side effects  3. Overweight -Unfortunately, she could not afford SGLT2 inhibitors, which would have helped with weight loss -Discussed about improving diet and made specific suggestions at last OV - gained 6 lbs since last   Philemon Kingdom, MD PhD [facility] Endocrinology

## 2019-12-16 NOTE — Patient Instructions (Addendum)
Please continue: - Glipizide XL 2.5 mg daily before b'fast If sugars increase, we can increase the dose to 5 mg.  Check sugars once a day, rotating check times.  Please return in 4 months with your sugar log.

## 2019-12-20 ENCOUNTER — Encounter (HOSPITAL_COMMUNITY): Payer: Self-pay

## 2019-12-20 NOTE — Progress Notes (Addendum)
COVID Vaccine Completed:  x3 Date COVID Vaccine completed:  02-18-19 & 03-11-19 11-17-19 Booster COVID vaccine manufacturer: [place]   PCP - Leighton Ruff, MD Cardiologist - Cherlynn Kaiser, MD.  Last [place] 12-10-19 in Epic  Cardiac Clearance in [place] dated 12-10-19 by Dr. Margaretann Loveless  Chest x-ray -  EKG - 12-10-19 in Epic Stress Test - 01-12-14 in Epic ECHO - 01-03-14 in Epic Cardiac Cath - 2011 Pacemaker/ICD device last checked:  Sleep Study - 08-11-12 in Epic.  +sleep apnea CPAP - Yes  Fasting Blood Sugar - 114 to 180s Checks Blood Sugar - one time a day  Blood Thinner Instructions: Aspirin Instructions: Last Dose:  Anesthesia review: CAD, PVCs, HTN, DM, OSA, CKD, NASH  Patient denies shortness of breath, fever, cough and chest pain at PAT appointment.  Able to climb a flight of stairs slowly due to knee pain.  Able to perform ADLs without difficulty and continues to work part-time    Patient verbalized understanding of instructions that were given to them at the PAT appointment. Patient was also instructed that they will need to review over the PAT instructions again at home before surgery.

## 2019-12-20 NOTE — Patient Instructions (Addendum)
DUE TO COVID-19 ONLY ONE VISITOR IS ALLOWED TO COME WITH YOU AND STAY IN THE WAITING ROOM ONLY DURING PRE OP AND PROCEDURE.   IF YOU WILL BE ADMITTED INTO THE HOSPITAL YOU ARE ALLOWED ONE SUPPORT PERSON DURING VISITATION HOURS ONLY (10AM -8PM)   . The support person may change daily. . The support person must pass our screening, gel in and out, and wear a mask at all times, including in the patient's room. . Patients must also wear a mask when staff or their support person are in the room.   COVID SWAB TESTING MUST BE COMPLETED ON:   Friday, 12-31-19 @  2:00 PM   [address] [place], [place] [address]  (Must self quarantine after testing. Follow instructions on handout.)   Your procedure is scheduled on:   Monday, 01-03-20   Report to [facility] Main  Entrance    Report to admitting at 8:00 AM   Call this number if you have problems the morning of surgery [phone]   Do not eat food :After Midnight.   May have liquids until 7:30 AM day of surgery  CLEAR LIQUID DIET  Foods Allowed                                                                     Foods Excluded  Water, Black Coffee and tea, regular and decaf               liquids that you cannot  Plain Jell-O in any flavor  (No red)                                     see through such as: Fruit ices (not with fruit pulp)                                      milk, soups, orange juice              Iced Popsicles (No red)                                      All solid food                                   Apple juices Sports drinks like Gatorade (No red) Lightly seasoned clear broth or consume(fat free) Sugar, honey syrup    Complete one G2 drink the morning of surgery at  7:30 AM  the day of surgery.    Oral Hygiene is also important to reduce your risk of infection.                                    Remember - BRUSH YOUR TEETH THE MORNING OF SURGERY WITH YOUR REGULAR TOOTHPASTE   Do NOT smoke after  Midnight   Take these medicines the  morning of surgery with A SIP OF WATER: None  How to Manage Your Diabetes Before and After Surgery  Why is it important to control my blood sugar before and after surgery? . Improving blood sugar levels before and after surgery helps healing and can limit problems. . A way of improving blood sugar control is eating a healthy diet by: o  Eating less sugar and carbohydrates o  Increasing activity/exercise o  Talking with your doctor about reaching your blood sugar goals . High blood sugars (greater than 180 mg/dL) can raise your risk of infections and slow your recovery, so you will need to focus on controlling your diabetes during the weeks before surgery. . Make sure that the doctor who takes care of your diabetes knows about your planned surgery including the date and location.  How do I manage my blood sugar before surgery? . Check your blood sugar at least 4 times a day, starting 2 days before surgery, to make sure that the level is not too high or low. o Check your blood sugar the morning of your surgery when you wake up and every 2 hours until you get to the Short Stay unit. . If your blood sugar is less than 70 mg/dL, you will need to treat for low blood sugar: o Do not take insulin. o Treat a low blood sugar (less than 70 mg/dL) with  cup of clear juice (cranberry or apple), 4 glucose tablets, OR glucose gel. o Recheck blood sugar in 15 minutes after treatment (to make sure it is greater than 70 mg/dL). If your blood sugar is not greater than 70 mg/dL on recheck, call [phone] for further instructions. . Report your blood sugar to the short stay nurse when you get to Short Stay.  . If you are admitted to the hospital after surgery: o Your blood sugar will be checked by the staff and you will probably be given insulin after surgery (instead of oral diabetes medicines) to make sure you have good blood sugar levels. o The goal for blood sugar  control after surgery is 80-180 mg/dL.   WHAT DO I DO ABOUT MY DIABETES MEDICATION?  Marland Kitchen Do not take oral diabetes medicines (pills) the morning of surgery.  . THE DAY BEFORE SURGERY:  Take Glipizide as prescribed .       Marland Kitchen THE MORNING OF SURGERY:  Do not take Glipizide.   Reviewed and Endorsed by [facility] Patient Education Committee, August 2015                               You may not have any metal on your body including hair pins, jewelry, and body piercings             Do not wear make-up, lotions, powders, perfumes/cologne, or deodorant             Do not wear nail polish.  Do not shave  48 hours prior to surgery.    Do not bring valuables to the hospital. [place].   Contacts, dentures or bridgework may not be worn into surgery.   Bring small overnight bag day of surgery.   Bring CPAP mask and tubing day of surgery    Special Instructions: Bring a copy of your healthcare power of attorney and living will documents         the day of surgery if  you haven't scanned them in before.              Please read over the following fact sheets you were given: IF YOU HAVE QUESTIONS ABOUT YOUR PRE OP INSTRUCTIONS PLEASE CALL [phone]   [place] - Preparing for Surgery Before surgery, you can play an important role.  Because skin is not sterile, your skin needs to be as free of germs as possible.  You can reduce the number of germs on your skin by washing with CHG (chlorahexidine gluconate) soap before surgery.  CHG is an antiseptic cleaner which kills germs and bonds with the skin to continue killing germs even after washing. Please DO NOT use if you have an allergy to CHG or antibacterial soaps.  If your skin becomes reddened/irritated stop using the CHG and inform your nurse when you arrive at Short Stay. Do not shave (including legs and underarms) for at least 48 hours prior to the first CHG shower.  You may shave your  face/neck.  Please follow these instructions carefully:  1.  Shower with CHG Soap the night before surgery and the  morning of surgery.  2.  If you choose to wash your hair, wash your hair first as usual with your normal  shampoo.  3.  After you shampoo, rinse your hair and body thoroughly to remove the shampoo.                             4.  Use CHG as you would any other liquid soap.  You can apply chg directly to the skin and wash.  Gently with a scrungie or clean washcloth.  5.  Apply the CHG Soap to your body ONLY FROM THE NECK DOWN.   Do   not use on face/ open                           Wound or open sores. Avoid contact with eyes, ears mouth and   genitals (private parts).                       Wash face,  Genitals (private parts) with your normal soap.             6.  Wash thoroughly, paying special attention to the area where your    surgery  will be performed.  7.  Thoroughly rinse your body with warm water from the neck down.  8.  DO NOT shower/wash with your normal soap after using and rinsing off the CHG Soap.                9.  Pat yourself dry with a clean towel.            10.  Wear clean pajamas.            11.  Place clean sheets on your bed the night of your first shower and do not  sleep with pets. Day of Surgery : Do not apply any lotions/deodorants the morning of surgery.  Please wear clean clothes to the hospital/surgery center.  FAILURE TO FOLLOW THESE INSTRUCTIONS MAY RESULT IN THE CANCELLATION OF YOUR SURGERY  PATIENT SIGNATURE_________________________________  NURSE SIGNATURE__________________________________  ________________________________________________________________________   Adam Phenix  An incentive spirometer is a tool that can help keep your lungs clear and active. This tool measures how well you are filling your  lungs with each breath. Taking long deep breaths may help reverse or decrease the chance of developing breathing (pulmonary)  problems (especially infection) following:  A long period of time when you are unable to move or be active. BEFORE THE PROCEDURE   If the spirometer includes an indicator to show your best effort, your nurse or respiratory therapist will set it to a desired goal.  If possible, sit up straight or lean slightly forward. Try not to slouch.  Hold the incentive spirometer in an upright position. INSTRUCTIONS FOR USE  1. Sit on the edge of your bed if possible, or sit up as far as you can in bed or on a chair. 2. Hold the incentive spirometer in an upright position. 3. Breathe out normally. 4. Place the mouthpiece in your mouth and seal your lips tightly around it. 5. Breathe in slowly and as deeply as possible, raising the piston or the ball toward the top of the column. 6. Hold your breath for 3-5 seconds or for as long as possible. Allow the piston or ball to fall to the bottom of the column. 7. Remove the mouthpiece from your mouth and breathe out normally. 8. Rest for a few seconds and repeat Steps 1 through 7 at least 10 times every 1-2 hours when you are awake. Take your time and take a few normal breaths between deep breaths. 9. The spirometer may include an indicator to show your best effort. Use the indicator as a goal to work toward during each repetition. 10. After each set of 10 deep breaths, practice coughing to be sure your lungs are clear. If you have an incision (the cut made at the time of surgery), support your incision when coughing by placing a pillow or rolled up towels firmly against it. Once you are able to get out of bed, walk around indoors and cough well. You may stop using the incentive spirometer when instructed by your caregiver.  RISKS AND COMPLICATIONS  Take your time so you do not get dizzy or light-headed.  If you are in pain, you may need to take or ask for pain medication before doing incentive spirometry. It is harder to take a deep breath if you are having  pain. AFTER USE  Rest and breathe slowly and easily.  It can be helpful to keep track of a log of your progress. Your caregiver can provide you with a simple table to help with this. If you are using the spirometer at home, follow these instructions: [place] IF:   You are having difficultly using the spirometer.  You have trouble using the spirometer as often as instructed.  Your pain medication is not giving enough relief while using the spirometer.  You develop fever of 100.5 F (38.1 C) or higher. SEEK IMMEDIATE MEDICAL CARE IF:   You cough up bloody sputum that had not been present before.  You develop fever of 102 F (38.9 C) or greater.  You develop worsening pain at or near the incision site. MAKE SURE YOU:   Understand these instructions.  Will watch your condition.  Will get help right away if you are not doing well or get worse. Document Released: 06/03/2006 Document Revised: 04/15/2011 Document Reviewed: 08/04/2006 ExitCare Patient Information 2014 ExitCare, [place].   ________________________________________________________________________  WHAT IS A BLOOD TRANSFUSION? Blood Transfusion Information  A transfusion is the replacement of blood or some of its parts. Blood is made up of multiple cells which provide different functions.  Red blood cells carry oxygen and are used for blood loss replacement.  White blood cells fight against infection.  Platelets control bleeding.  Plasma helps clot blood.  Other blood products are available for specialized needs, such as hemophilia or other clotting disorders. BEFORE THE TRANSFUSION  Who gives blood for transfusions?   Healthy volunteers who are fully evaluated to make sure their blood is safe. This is blood bank blood. Transfusion therapy is the safest it has ever been in the practice of medicine. Before blood is taken from a donor, a complete history is taken to make sure that person has no history  of diseases nor engages in risky social behavior (examples are intravenous drug use or sexual activity with multiple partners). The donor's travel history is screened to minimize risk of transmitting infections, such as malaria. The donated blood is tested for signs of infectious diseases, such as HIV and hepatitis. The blood is then tested to be sure it is compatible with you in order to minimize the chance of a transfusion reaction. If you or a relative donates blood, this is often done in anticipation of surgery and is not appropriate for emergency situations. It takes many days to process the donated blood. RISKS AND COMPLICATIONS Although transfusion therapy is very safe and saves many lives, the main dangers of transfusion include:   Getting an infectious disease.  Developing a transfusion reaction. This is an allergic reaction to something in the blood you were given. Every precaution is taken to prevent this. The decision to have a blood transfusion has been considered carefully by your caregiver before blood is given. Blood is not given unless the benefits outweigh the risks. AFTER THE TRANSFUSION  Right after receiving a blood transfusion, you will usually feel much better and more energetic. This is especially true if your red blood cells have gotten low (anemic). The transfusion raises the level of the red blood cells which carry oxygen, and this usually causes an energy increase.  The nurse administering the transfusion will monitor you carefully for complications. HOME CARE INSTRUCTIONS  No special instructions are needed after a transfusion. You may find your energy is better. Speak with your caregiver about any limitations on activity for underlying diseases you may have. SEEK MEDICAL CARE IF:   Your condition is not improving after your transfusion.  You develop redness or irritation at the intravenous (IV) site. SEEK IMMEDIATE MEDICAL CARE IF:  Any of the following symptoms  occur over the next 12 hours:  Shaking chills.  You have a temperature by mouth above 102 F (38.9 C), not controlled by medicine.  Chest, back, or muscle pain.  People around you feel you are not acting correctly or are confused.  Shortness of breath or difficulty breathing.  Dizziness and fainting.  You get a rash or develop hives.  You have a decrease in urine output.  Your urine turns a dark color or changes to pink, red, or brown. Any of the following symptoms occur over the next 10 days:  You have a temperature by mouth above 102 F (38.9 C), not controlled by medicine.  Shortness of breath.  Weakness after normal activity.  The white part of the eye turns yellow (jaundice).  You have a decrease in the amount of urine or are urinating less often.  Your urine turns a dark color or changes to pink, red, or brown. Document Released: 01/19/2000 Document Revised: 04/15/2011 Document Reviewed: 09/07/2007 [facility] Patient Information 2014 [place].  _______________________________________________________________________ 

## 2019-12-22 ENCOUNTER — Encounter (HOSPITAL_COMMUNITY): Payer: Self-pay

## 2019-12-22 ENCOUNTER — Other Ambulatory Visit: Payer: Self-pay

## 2019-12-22 ENCOUNTER — Encounter (HOSPITAL_COMMUNITY)
Admission: RE | Admit: 2019-12-22 | Discharge: 2019-12-22 | Disposition: A | Discharge status: Home / Self Care | Payer: Medicare Other | Source: Ambulatory Visit | Attending: Orthopedic Surgery | Admitting: Orthopedic Surgery

## 2019-12-22 DIAGNOSIS — Z79899 Other long term (current) drug therapy: Secondary | ICD-10-CM | POA: Insufficient documentation

## 2019-12-22 DIAGNOSIS — Z79891 Long term (current) use of opiate analgesic: Secondary | ICD-10-CM | POA: Insufficient documentation

## 2019-12-22 DIAGNOSIS — E1122 Type 2 diabetes mellitus with diabetic chronic kidney disease: Secondary | ICD-10-CM | POA: Diagnosis not present

## 2019-12-22 DIAGNOSIS — K219 Gastro-esophageal reflux disease without esophagitis: Secondary | ICD-10-CM | POA: Diagnosis not present

## 2019-12-22 DIAGNOSIS — Z791 Long term (current) use of non-steroidal anti-inflammatories (NSAID): Secondary | ICD-10-CM | POA: Diagnosis not present

## 2019-12-22 DIAGNOSIS — I120 Hypertensive chronic kidney disease with stage 5 chronic kidney disease or end stage renal disease: Secondary | ICD-10-CM | POA: Diagnosis not present

## 2019-12-22 DIAGNOSIS — G4730 Sleep apnea, unspecified: Secondary | ICD-10-CM | POA: Diagnosis not present

## 2019-12-22 DIAGNOSIS — Z7984 Long term (current) use of oral hypoglycemic drugs: Secondary | ICD-10-CM | POA: Diagnosis not present

## 2019-12-22 DIAGNOSIS — N189 Chronic kidney disease, unspecified: Secondary | ICD-10-CM | POA: Diagnosis not present

## 2019-12-22 DIAGNOSIS — Z01812 Encounter for preprocedural laboratory examination: Secondary | ICD-10-CM | POA: Diagnosis not present

## 2019-12-22 DIAGNOSIS — M1712 Unilateral primary osteoarthritis, left knee: Secondary | ICD-10-CM | POA: Diagnosis not present

## 2019-12-22 DIAGNOSIS — K7581 Nonalcoholic steatohepatitis (NASH): Secondary | ICD-10-CM | POA: Diagnosis not present

## 2019-12-22 HISTORY — DX: Chronic kidney disease, unspecified: N18.9

## 2019-12-22 HISTORY — DX: Sleep apnea, unspecified: G47.30

## 2019-12-22 HISTORY — DX: Pneumonia, unspecified organism: J18.9

## 2019-12-22 HISTORY — DX: Nonalcoholic steatohepatitis (NASH): K75.81

## 2019-12-22 HISTORY — DX: Spinal stenosis, site unspecified: M48.00

## 2019-12-22 HISTORY — DX: Depression, unspecified: F32.A

## 2019-12-22 LAB — COMPREHENSIVE METABOLIC PANEL
ALT: 57 U/L — ABNORMAL HIGH (ref 0–44)
AST: 37 U/L (ref 15–41)
Albumin: 4.4 g/dL (ref 3.5–5.0)
Alkaline Phosphatase: 201 U/L — ABNORMAL HIGH (ref 38–126)
Anion gap: 8 (ref 5–15)
BUN: 25 mg/dL — ABNORMAL HIGH (ref 8–23)
CO2: 29 mmol/L (ref 22–32)
Calcium: 9.6 mg/dL (ref 8.9–10.3)
Chloride: 101 mmol/L (ref 98–111)
Creatinine, Ser: 0.85 mg/dL (ref 0.44–1.00)
GFR, Estimated: >60 mL/min (ref >60)
Glucose, Bld: 97 mg/dL (ref 70–99)
Potassium: 3.4 mmol/L — ABNORMAL LOW (ref 3.5–5.1)
Sodium: 138 mmol/L (ref 135–145)
Total Bilirubin: 0.6 mg/dL (ref 0.3–1.2)
Total Protein: 7.8 g/dL (ref 6.5–8.1)

## 2019-12-22 LAB — PROTIME-INR
INR: 0.9 (ref 0.8–1.2)
Prothrombin Time: 12.2 seconds (ref 11.4–15.2)

## 2019-12-22 LAB — CBC
HCT: 38.8 % (ref 36.0–46.0)
Hemoglobin: 12.9 g/dL (ref 12.0–15.0)
MCH: 30.2 pg (ref 26.0–34.0)
MCHC: 33.2 g/dL (ref 30.0–36.0)
MCV: 90.9 fL (ref 80.0–100.0)
Platelets: 230 10*3/uL (ref 150–400)
RBC: 4.27 MIL/uL (ref 3.87–5.11)
RDW: 12.8 % (ref 11.5–15.5)
WBC: 8.5 10*3/uL (ref 4.0–10.5)
nRBC: 0 % (ref 0.0–0.2)

## 2019-12-22 LAB — TYPE AND SCREEN
ABO/RH(D): A POS
Antibody Screen: NEGATIVE

## 2019-12-22 LAB — GLUCOSE, CAPILLARY: Glucose-Capillary: 112 mg/dL — ABNORMAL HIGH (ref 70–99)

## 2019-12-22 LAB — APTT: aPTT: 29 seconds (ref 24–36)

## 2019-12-22 LAB — SURGICAL PCR SCREEN
MRSA, PCR: NEGATIVE
Staphylococcus aureus: NEGATIVE

## 2019-12-24 NOTE — Anesthesia Preprocedure Evaluation (Addendum)
Anesthesia Evaluation  Patient identified by MRN, date of birth, ID band Patient awake    Reviewed: Allergy & Precautions, NPO status , Patient's Chart, lab work & pertinent test results  Airway Mallampati: III  TM Distance: >3 FB Neck ROM: Full    Dental no notable dental hx.    Pulmonary sleep apnea and Continuous Positive Airway Pressure Ventilation ,    Pulmonary exam normal breath sounds clear to auscultation       Cardiovascular hypertension, Pt. on medications + CAD  Normal cardiovascular exam Rhythm:Regular Rate:Normal  ECG: NSR, rate 63   Neuro/Psych PSYCHIATRIC DISORDERS Anxiety Depression negative neurological ROS     GI/Hepatic GERD  Medicated and Controlled,(+) Hepatitis -NASH   Endo/Other  diabetes, Oral Hypoglycemic Agents  Renal/GU negative Renal ROS     Musculoskeletal  (+) Arthritis , Spinal stenosis   Abdominal   Peds  Hematology HLD   Anesthesia Other Findings left knee osteoarthritis  Reproductive/Obstetrics                            Anesthesia Physical Anesthesia Plan  ASA: III  Anesthesia Plan: General and Regional   Post-op Pain Management: GA combined w/ Regional for post-op pain   Induction: Intravenous  PONV Risk Score and Plan: 3 and Ondansetron, Dexamethasone and Treatment may vary due to age or medical condition  Airway Management Planned: LMA  Additional Equipment:   Intra-op Plan:   Post-operative Plan: Extubation in OR  Informed Consent: I have reviewed the patients History and Physical, chart, labs and discussed the procedure including the risks, benefits and alternatives for the proposed anesthesia with the patient or authorized representative who has indicated his/her understanding and acceptance.     Dental advisory given  Plan Discussed with: CRNA  Anesthesia Plan Comments: (Reviewed PAT note 12/22/2019, Konrad Felix, PA-C)        Anesthesia Quick Evaluation

## 2019-12-24 NOTE — Progress Notes (Signed)
Anesthesia Chart Review   Case: [id number] Date/Time: 01/03/20 1015   Procedure: TOTAL KNEE ARTHROPLASTY (Left Knee) - 62mn   Anesthesia type: Choice   Pre-op diagnosis: left knee osteoarthritis   Location: [facility] ROOM 10 / [facility]   Surgeons: AGaynelle Arabian MD      DISCUSSION:74 y.o. never smoker with h/o HTN, GERD, CAD, CKD, NASH, DM II, sleep apnea, left knee OA scheduled for above procedure 01/03/2020 with Dr. FGaynelle Arabian   Pt last seen by cardiology 12/10/2019.  Per OV note, "Preoperative cardiovascular examination -Normal ECG, no symptoms with >4 METS. The patient is low risk for intermediate risk procedure. Risk is felt to be non-modifiable. No further cardiovascular testing is required prior to the procedure.  If this level of risk is acceptable to the patient and surgical team, the patient should be considered optimized from a cardiovascular standpoint. Patient understands the risk stratification, and is willing to proceed."  Anticipate pt can proceed with planned procedure barring acute status change.   VS: BP (!) 134/52   Pulse 61   Temp 36.6 C (Oral)   Resp 16   Ht 5' 6"  (1.676 m)   Wt 78.5 kg   SpO2 99%   BMI 27.92 kg/m   PROVIDERS: BLeighton Ruff MD is PCP   ACherlynn Kaiser MD is Cardiologist  LABS: Labs reviewed: Acceptable for surgery. (all labs ordered are listed, but only abnormal results are displayed)  Labs Reviewed  COMPREHENSIVE METABOLIC PANEL - Abnormal; Notable for the following components:      Result Value   Potassium 3.4 (*)    BUN 25 (*)    ALT 57 (*)    Alkaline Phosphatase 201 (*)    All other components within normal limits  GLUCOSE, CAPILLARY - Abnormal; Notable for the following components:   Glucose-Capillary 112 (*)    All other components within normal limits  SURGICAL PCR SCREEN  APTT  CBC  PROTIME-INR  TYPE AND SCREEN     IMAGES:   EKG: 12/10/19 Rate 63 bpm  NSR  CV: Myocardial Perfusion 01/12/2014 Overall  Impression:  Normal stress nuclear study.   Echo 01/03/2014 Study Conclusions   - Left ventricle: The cavity size was normal. The estimated  ejection fraction was 55%. Wall motion was normal; there were no  regional wall motion abnormalities. Doppler parameters are  consistent with abnormal left ventricular relaxation (grade 1  diastolic dysfunction).  - Aortic valve: There was no stenosis.  - Mitral valve: Mildly calcified annulus. There was trivial  regurgitation.  - Right ventricle: The cavity size was normal. Systolic function  was normal.  - Tricuspid valve: Peak RV-RA gradient (S): 17 mm Hg.  - Pulmonary arteries: PA peak pressure: 20 mm Hg (S).  - Inferior vena cava: The vessel was normal in size. The  respirophasic diameter changes were in the normal range (= 50%),  consistent with normal central venous pressure.  Past Medical History:  Diagnosis Date  . Anxiety   . Arthritis   . Chronic kidney disease   . Coronary artery disease    nonobstructive by cardiac catheterization 2011 with a 30% LAD lesion  . Depression   . Diabetes ([place]    Type 2  . GERD (gastroesophageal reflux disease)   . Glaucoma   . Hypercholesteremia   . Hypertension   . NASH (nonalcoholic steatohepatitis)    NASH  . Pneumonia    30 years ago  . PVC (premature ventricular contraction)   . Seasonal  allergies   . Sleep apnea   . Spinal stenosis     Past Surgical History:  Procedure Laterality Date  . ANTERIOR AND POSTERIOR VAGINAL REPAIR    . APPENDECTOMY  1959  . BLADDER SUSPENSION    . BUNIONECTOMY    . CARDIAC CATHETERIZATION  2011   clean cath  . CARDIOVASCULAR STRESS TEST    . CATARACT EXTRACTION W/ INTRAOCULAR LENS IMPLANT Right 2010  . KNEE ARTHROSCOPY Left   . NASAL RECONSTRUCTION  1976  . SHOULDER ARTHROSCOPY Left   . TOTAL ABDOMINAL HYSTERECTOMY  1976    MEDICATIONS: . ALPRAZolam (XANAX) 0.5 MG tablet  . brimonidine (ALPHAGAN) 0.2 % ophthalmic solution   . Cholecalciferol (VITAMIN D) 2000 UNITS CAPS  . Cranberry-Vitamin C-Vitamin E (CRANBERRY PLUS VITAMIN C) 4200-20-3 MG-MG-UNIT CAPS  . escitalopram (LEXAPRO) 10 MG tablet  . glipiZIDE (GLUCOTROL XL) 2.5 MG 24 hr tablet  . hydrochlorothiazide (MICROZIDE) 12.5 MG capsule  . HYDROcodone-acetaminophen (NORCO/VICODIN) 5-325 MG tablet  . ibuprofen (ADVIL,MOTRIN) 200 MG tablet  . inFLIXimab (REMICADE) 100 MG injection  . latanoprost (XALATAN) 0.005 % ophthalmic solution  . losartan (COZAAR) 50 MG tablet  . meclizine (ANTIVERT) 12.5 MG tablet  . Misc Natural Products (TURMERIC CURCUMIN) CAPS  . omeprazole (PRILOSEC) 20 MG capsule  . rosuvastatin (CRESTOR) 5 MG tablet  . timolol (TIMOPTIC) 0.5 % ophthalmic solution  . vitamin B-12 (CYANOCOBALAMIN) 1000 MCG tablet   No current facility-administered medications for this encounter.    Konrad Felix, PA-C [facility] Pre-Surgical Testing [phone]

## 2019-12-31 ENCOUNTER — Other Ambulatory Visit (HOSPITAL_COMMUNITY)
Admission: RE | Admit: 2019-12-31 | Discharge: 2019-12-31 | Disposition: A | Discharge status: Home / Self Care | Payer: Medicare Other | Source: Ambulatory Visit | Attending: Orthopedic Surgery | Admitting: Orthopedic Surgery

## 2019-12-31 DIAGNOSIS — Z01812 Encounter for preprocedural laboratory examination: Secondary | ICD-10-CM | POA: Insufficient documentation

## 2019-12-31 DIAGNOSIS — Z20822 Contact with and (suspected) exposure to covid-19: Secondary | ICD-10-CM | POA: Diagnosis not present

## 2019-12-31 LAB — SARS CORONAVIRUS 2 (TAT 6-24 HRS): SARS Coronavirus 2: NEGATIVE

## 2020-01-02 MED ORDER — BUPIVACAINE LIPOSOME 1.3 % IJ SUSP
20.0000 mL | Freq: Once | INTRAMUSCULAR | Status: DC
  Filled 2020-01-02: qty 20

## 2020-01-02 NOTE — H&P (Signed)
TOTAL KNEE ADMISSION H&P  Patient is being admitted for left total knee arthroplasty.  Subjective:  Chief Complaint:left knee pain.  HPI: Tammy Pineda, 74 y.o. female, has a history of pain and functional disability in the left knee due to arthritis and has failed non-surgical conservative treatments for greater than 12 weeks to includecorticosteriod injections, viscosupplementation injections and activity modification.  Onset of symptoms was gradual, starting 2 years ago with gradually worsening course since that time. The patient noted no past surgery on the left knee(s).  Patient currently rates pain in the left knee(s) at 8 out of 10 with activity. Patient has worsening of pain with activity and weight bearing and pain that interferes with activities of daily living.  Patient has evidence of joint space narrowing by imaging studies.  There is no active infection.  Patient Active Problem List   Diagnosis Date Noted  . Type 2 diabetes mellitus with hyperglycemia, without long-term current use of insulin ([place]) 04/27/2018  . Jaw pain 11/19/2012  . Obstructive sleep apnea 08/23/2012  . CAD, NATIVE VESSEL 12/21/2009  . PALPITATIONS 10/19/2009  . HYPERLIPIDEMIA 09/26/2008  . Essential hypertension 09/26/2008  . REACTIVE AIRWAY DISEASE 09/26/2008  . GERD 09/26/2008  . ARTHROSCOPY, LEFT KNEE, HX OF 09/26/2008   Past Medical History:  Diagnosis Date  . Anxiety   . Arthritis   . Chronic kidney disease   . Coronary artery disease    nonobstructive by cardiac catheterization 2011 with a 30% LAD lesion  . Depression   . Diabetes ([place])    Type 2  . GERD (gastroesophageal reflux disease)   . Glaucoma   . Hypercholesteremia   . Hypertension   . NASH (nonalcoholic steatohepatitis)    NASH  . Pneumonia    30 years ago  . PVC (premature ventricular contraction)   . Seasonal allergies   . Sleep apnea   . Spinal stenosis     Past Surgical History:  Procedure Laterality Date  .  ANTERIOR AND POSTERIOR VAGINAL REPAIR    . APPENDECTOMY  1959  . BLADDER SUSPENSION    . BUNIONECTOMY    . CARDIAC CATHETERIZATION  2011   clean cath  . CARDIOVASCULAR STRESS TEST    . CATARACT EXTRACTION W/ INTRAOCULAR LENS IMPLANT Right 2010  . KNEE ARTHROSCOPY Left   . NASAL RECONSTRUCTION  1976  . SHOULDER ARTHROSCOPY Left   . TOTAL ABDOMINAL HYSTERECTOMY  1976    Current Facility-Administered Medications  Medication Dose Route Frequency Provider Last Rate Last Admin  . [START ON 01/03/2020] bupivacaine liposome (EXPAREL) 1.3 % injection 266 mg  20 mL Other Once Gaynelle Arabian, MD       Current Outpatient Medications  Medication Sig Dispense Refill Last Dose  . ALPRAZolam (XANAX) 0.5 MG tablet Take 0.25 mg by mouth at bedtime as needed for anxiety.     . brimonidine (ALPHAGAN) 0.2 % ophthalmic solution Place 1 drop into both eyes 2 (two) times daily.     . Cholecalciferol (VITAMIN D) 2000 UNITS CAPS Take 2,000 Units by mouth daily.      . Cranberry-Vitamin C-Vitamin E (CRANBERRY PLUS VITAMIN C) 4200-20-3 MG-MG-UNIT CAPS Take by mouth.     . escitalopram (LEXAPRO) 10 MG tablet Take 10 mg by mouth at bedtime.      Marland Kitchen glipiZIDE (GLUCOTROL XL) 2.5 MG 24 hr tablet Take 1 tablet (2.5 mg total) by mouth daily before breakfast. 90 tablet 3   . hydrochlorothiazide (MICROZIDE) 12.5 MG capsule Take 12.5 mg  by mouth every evening.      Marland Kitchen HYDROcodone-acetaminophen (NORCO/VICODIN) 5-325 MG tablet Take 1 tablet by mouth every 6 (six) hours as needed. 6 tablet 0   . ibuprofen (ADVIL,MOTRIN) 200 MG tablet Take 800 mg by mouth every 8 (eight) hours as needed for moderate pain.      Marland Kitchen inFLIXimab (REMICADE) 100 MG injection Inject 100 mg into the vein every 6 (six) weeks.     Marland Kitchen latanoprost (XALATAN) 0.005 % ophthalmic solution Place 1 drop into both eyes at bedtime.     Marland Kitchen losartan (COZAAR) 50 MG tablet Take 50 mg by mouth at bedtime.     . meclizine (ANTIVERT) 12.5 MG tablet Take 1 tablet (12.5 mg  total) by mouth 3 (three) times daily as needed for dizziness. 21 tablet 0   . Misc Natural Products (TURMERIC CURCUMIN) CAPS Take 1 capsule by mouth daily. 1500 mg/capsule     . omeprazole (PRILOSEC) 20 MG capsule Take 20 mg by mouth daily as needed (acid reflux/indigestion.). Pt takes as needed.     . rosuvastatin (CRESTOR) 5 MG tablet Take 5 mg by mouth at bedtime.      . timolol (TIMOPTIC) 0.5 % ophthalmic solution Place 1 drop into both eyes daily.     . vitamin B-12 (CYANOCOBALAMIN) 1000 MCG tablet Take 1,000 mcg by mouth daily.      Allergies  Allergen Reactions  . Metformin And Related Diarrhea  . Gabapentin Palpitations  . Sulfamethoxazole-Trimethoprim Rash  . Tape Rash    Social History   Tobacco Use  . Smoking status: Never Smoker  . Smokeless tobacco: Never Used  Substance Use Topics  . Alcohol use: No    Family History  Problem Relation Age of Onset  . Heart disease Father        open heart surgery at 60  . Colon cancer Father   . Heart disease Mother        open heart surgery at age 32  . Hypertension Mother   . Heart attack Maternal Grandfather   . Stroke Paternal Grandmother   . Heart block Brother      Review of Systems  Constitutional: Negative for chills and fever.  Respiratory: Negative for cough and shortness of breath.   Cardiovascular: Negative for chest pain.  Gastrointestinal: Negative for nausea and vomiting.  Musculoskeletal: Positive for arthralgias.    Objective:  Physical Exam Patient is a 74 year old female.  Well nourished and well developed. General: Alert and oriented x3, cooperative and pleasant, no acute distress. Head: normocephalic, atraumatic, neck supple. Eyes: EOMI. Respiratory: breath sounds clear in all fields, no wheezing, rales, or rhonchi. Cardiovascular: Regular rate and rhythm, no murmurs, gallops or rubs. Abdomen: non-tender to palpation and soft, normoactive bowel sounds. Musculoskeletal:  Left Knee Exam: No  effusion present. No swelling present. The range of motion is: 5 to 130 degrees. Moderate crepitus on range of motion of the knee. Positive medial joint line tenderness. No lateral joint line tenderness. The knee is stable.  Calves soft and nontender. Motor function intact in LE. Strength 5/5 LE bilaterally. Neuro: Distal pulses 2+. Sensation to light touch intact in LE. Vital signs in last 24 hours:    Labs:   Estimated body mass index is 27.92 kg/m as calculated from the following:   Height as of 12/22/19: 5' 6"  (1.676 m).   Weight as of 12/22/19: 78.5 kg.   Imaging Review Plain radiographs demonstrate severe degenerative joint disease of the left knee(s).  The overall alignment isneutral. The bone quality appears to be adequate for age and reported activity level.      Assessment/Plan:  End stage arthritis, left knee   The patient history, physical examination, clinical judgment of the provider and imaging studies are consistent with end stage degenerative joint disease of the left knee(s) and total knee arthroplasty is deemed medically necessary. The treatment options including medical management, injection therapy arthroscopy and arthroplasty were discussed at length. The risks and benefits of total knee arthroplasty were presented and reviewed. The risks due to aseptic loosening, infection, stiffness, patella tracking problems, thromboembolic complications and other imponderables were discussed. The patient acknowledged the explanation, agreed to proceed with the plan and consent was signed. Patient is being admitted for inpatient treatment for surgery, pain control, PT, OT, prophylactic antibiotics, VTE prophylaxis, progressive ambulation and ADL's and discharge planning. The patient is planning to be discharged home.  Therapy Plans: outpatient therapy at [facility] Disposition: Home with husband Planned DVT Prophylaxis: aspirin 360m BID DME needed: none PCP: Dr.  ELeighton Ruff clearance received Cardiologist: Dr. AMargaretann Loveless(referred due to carotid bruit) TXA: IV Allergies: gabapentin - confusion, metformin - diarrhea, bactrim - rash Anesthesia Concerns: none BMI: 27.4 Last HgbA1c: 6.5%  Other:  -Retired OHaematologist  -No gabapentin.  - Takes Norco 5 as needed for pain, max of 1 daily. **She has a skin tear on her left hand which does not appear infected today. I instructed her to clean gently and use Neosporin/vaseline and gauze on this daily. If it does appear to become infected we will send in antibiotics.   Patient's anticipated LOS is less than 2 midnights, meeting these requirements: - Lives within 1 hour of care - Has a competent adult at home to recover with post-op recover - NO history of  - Chronic pain requiring opiods  - Diabetes  - Coronary Artery Disease  - Heart failure  - Heart attack  - Stroke  - DVT/VTE  - Cardiac arrhythmia  - Respiratory Failure/COPD  - Renal failure  - Anemia  - Advanced Liver disease  - Patient was instructed on what medications to stop prior to surgery. - Follow-up visit in 2 weeks with Dr. AWynelle Link- Begin physical therapy following surgery - Pre-operative lab work as pre-surgical testing - Prescriptions will be provided in hospital at time of discharge  AGriffith Citron PA-C Orthopedic Surgery [facility] [place]([phone]

## 2020-01-03 ENCOUNTER — Observation Stay (HOSPITAL_COMMUNITY)
Admission: RE | Admit: 2020-01-03 | Discharge: 2020-01-04 | Disposition: A | Discharge status: Home / Self Care | Payer: Medicare Other | Source: Ambulatory Visit | Attending: Orthopedic Surgery | Admitting: Orthopedic Surgery

## 2020-01-03 ENCOUNTER — Encounter (HOSPITAL_COMMUNITY): Payer: Self-pay | Admitting: Orthopedic Surgery

## 2020-01-03 ENCOUNTER — Ambulatory Visit (HOSPITAL_COMMUNITY): Payer: Medicare Other | Admitting: Certified Registered Nurse Anesthetist

## 2020-01-03 ENCOUNTER — Other Ambulatory Visit: Payer: Self-pay

## 2020-01-03 ENCOUNTER — Ambulatory Visit (HOSPITAL_COMMUNITY): Payer: Medicare Other | Admitting: Physician Assistant

## 2020-01-03 ENCOUNTER — Encounter (HOSPITAL_COMMUNITY)
Admission: RE | Disposition: A | Discharge status: Home / Self Care | Payer: Self-pay | Source: Ambulatory Visit | Attending: Orthopedic Surgery

## 2020-01-03 DIAGNOSIS — I129 Hypertensive chronic kidney disease with stage 1 through stage 4 chronic kidney disease, or unspecified chronic kidney disease: Secondary | ICD-10-CM | POA: Diagnosis not present

## 2020-01-03 DIAGNOSIS — M1712 Unilateral primary osteoarthritis, left knee: Principal | ICD-10-CM | POA: Diagnosis present

## 2020-01-03 DIAGNOSIS — I2510 Atherosclerotic heart disease of native coronary artery without angina pectoris: Secondary | ICD-10-CM | POA: Diagnosis not present

## 2020-01-03 DIAGNOSIS — E119 Type 2 diabetes mellitus without complications: Secondary | ICD-10-CM | POA: Diagnosis not present

## 2020-01-03 DIAGNOSIS — Z7982 Long term (current) use of aspirin: Secondary | ICD-10-CM | POA: Diagnosis not present

## 2020-01-03 DIAGNOSIS — M179 Osteoarthritis of knee, unspecified: Secondary | ICD-10-CM

## 2020-01-03 DIAGNOSIS — Z79899 Other long term (current) drug therapy: Secondary | ICD-10-CM | POA: Insufficient documentation

## 2020-01-03 DIAGNOSIS — N189 Chronic kidney disease, unspecified: Secondary | ICD-10-CM | POA: Insufficient documentation

## 2020-01-03 DIAGNOSIS — M25562 Pain in left knee: Secondary | ICD-10-CM | POA: Diagnosis present

## 2020-01-03 DIAGNOSIS — M1710 Unilateral primary osteoarthritis, unspecified knee: Secondary | ICD-10-CM | POA: Diagnosis present

## 2020-01-03 DIAGNOSIS — G8918 Other acute postprocedural pain: Secondary | ICD-10-CM | POA: Diagnosis not present

## 2020-01-03 DIAGNOSIS — Z794 Long term (current) use of insulin: Secondary | ICD-10-CM | POA: Diagnosis not present

## 2020-01-03 DIAGNOSIS — I10 Essential (primary) hypertension: Secondary | ICD-10-CM | POA: Diagnosis not present

## 2020-01-03 HISTORY — PX: TOTAL KNEE ARTHROPLASTY: SHX125

## 2020-01-03 LAB — GLUCOSE, CAPILLARY
Glucose-Capillary: 142 mg/dL — ABNORMAL HIGH (ref 70–99)
Glucose-Capillary: 151 mg/dL — ABNORMAL HIGH (ref 70–99)

## 2020-01-03 LAB — ABO/RH: ABO/RH(D): A POS

## 2020-01-03 SURGERY — ARTHROPLASTY, KNEE, TOTAL
Anesthesia: Regional | Site: Knee | Laterality: Left

## 2020-01-03 MED ORDER — HYDROCHLOROTHIAZIDE 12.5 MG PO CAPS: 12.5000 mg | ORAL_CAPSULE | Freq: Every evening | ORAL | Status: DC

## 2020-01-03 MED ORDER — ONDANSETRON HCL 4 MG/2ML IJ SOLN: 4.0000 mg | Freq: Once | INTRAMUSCULAR | Status: DC | PRN

## 2020-01-03 MED ORDER — DEXAMETHASONE SODIUM PHOSPHATE 10 MG/ML IJ SOLN: 8.0000 mg | Freq: Once | INTRAMUSCULAR | Status: DC

## 2020-01-03 MED ORDER — METOCLOPRAMIDE HCL 5 MG/ML IJ SOLN: 5.0000 mg | Freq: Three times a day (TID) | INTRAMUSCULAR | Status: DC | PRN

## 2020-01-03 MED ORDER — DEXAMETHASONE SODIUM PHOSPHATE 10 MG/ML IJ SOLN
INTRAMUSCULAR | Status: DC | PRN
  Administered 2020-01-03: 10 mg via INTRAVENOUS

## 2020-01-03 MED ORDER — BISACODYL 10 MG RE SUPP: 10.0000 mg | Freq: Every day | RECTAL | Status: DC | PRN

## 2020-01-03 MED ORDER — DEXAMETHASONE SODIUM PHOSPHATE 10 MG/ML IJ SOLN
INTRAMUSCULAR | Status: AC
  Filled 2020-01-03: qty 1

## 2020-01-03 MED ORDER — ASPIRIN EC 325 MG PO TBEC
325.0000 mg | DELAYED_RELEASE_TABLET | Freq: Two times a day (BID) | ORAL | Status: DC
  Administered 2020-01-04: 325 mg via ORAL
  Filled 2020-01-03: qty 1

## 2020-01-03 MED ORDER — DEXAMETHASONE SODIUM PHOSPHATE 10 MG/ML IJ SOLN
10.0000 mg | Freq: Once | INTRAMUSCULAR | Status: AC
  Administered 2020-01-04: 10 mg via INTRAVENOUS
  Filled 2020-01-03: qty 1

## 2020-01-03 MED ORDER — TIMOLOL MALEATE 0.5 % OP SOLN
1.0000 [drp] | Freq: Every day | OPHTHALMIC | Status: DC
  Administered 2020-01-04: 1 [drp] via OPHTHALMIC
  Filled 2020-01-03: qty 5

## 2020-01-03 MED ORDER — METHOCARBAMOL 500 MG IVPB - SIMPLE MED
500.0000 mg | Freq: Four times a day (QID) | INTRAVENOUS | Status: DC | PRN
  Administered 2020-01-03: 500 mg via INTRAVENOUS
  Filled 2020-01-03: qty 50

## 2020-01-03 MED ORDER — LIDOCAINE 2% (20 MG/ML) 5 ML SYRINGE
INTRAMUSCULAR | Status: DC | PRN
  Administered 2020-01-03: 40 mg via INTRAVENOUS

## 2020-01-03 MED ORDER — OXYCODONE HCL 5 MG PO TABS
ORAL_TABLET | ORAL | Status: AC
  Filled 2020-01-03: qty 2

## 2020-01-03 MED ORDER — FENTANYL CITRATE (PF) 100 MCG/2ML IJ SOLN
50.0000 ug | INTRAMUSCULAR | Status: DC
  Filled 2020-01-03: qty 2

## 2020-01-03 MED ORDER — PROPOFOL 10 MG/ML IV BOLUS
INTRAVENOUS | Status: AC
  Filled 2020-01-03: qty 20

## 2020-01-03 MED ORDER — PROPOFOL 10 MG/ML IV BOLUS
INTRAVENOUS | Status: DC | PRN
  Administered 2020-01-03: 200 mg via INTRAVENOUS

## 2020-01-03 MED ORDER — ONDANSETRON HCL 4 MG PO TABS: 4.0000 mg | ORAL_TABLET | Freq: Four times a day (QID) | ORAL | Status: DC | PRN

## 2020-01-03 MED ORDER — DIPHENHYDRAMINE HCL 12.5 MG/5ML PO ELIX: 12.5000 mg | ORAL_SOLUTION | ORAL | Status: DC | PRN

## 2020-01-03 MED ORDER — ONDANSETRON HCL 4 MG/2ML IJ SOLN
INTRAMUSCULAR | Status: AC
  Filled 2020-01-03: qty 2

## 2020-01-03 MED ORDER — PROPOFOL 1000 MG/100ML IV EMUL
INTRAVENOUS | Status: AC
  Filled 2020-01-03: qty 100

## 2020-01-03 MED ORDER — ONDANSETRON HCL 4 MG/2ML IJ SOLN
INTRAMUSCULAR | Status: DC | PRN
  Administered 2020-01-03: 4 mg via INTRAVENOUS

## 2020-01-03 MED ORDER — ROPIVACAINE HCL 5 MG/ML IJ SOLN
INTRAMUSCULAR | Status: DC | PRN
  Administered 2020-01-03: 30 mL via PERINEURAL

## 2020-01-03 MED ORDER — PROPOFOL 1000 MG/100ML IV EMUL
INTRAVENOUS | Status: AC
  Filled 2020-01-03: qty 200

## 2020-01-03 MED ORDER — SODIUM CHLORIDE 0.9 % IR SOLN
Status: DC | PRN
  Administered 2020-01-03: 1000 mL

## 2020-01-03 MED ORDER — MORPHINE SULFATE (PF) 2 MG/ML IV SOLN: 0.5000 mg | INTRAVENOUS | Status: DC | PRN

## 2020-01-03 MED ORDER — MIDAZOLAM HCL 2 MG/2ML IJ SOLN
1.0000 mg | INTRAMUSCULAR | Status: AC
  Administered 2020-01-03: 2 mg via INTRAVENOUS
  Filled 2020-01-03: qty 2

## 2020-01-03 MED ORDER — PHENOL 1.4 % MT LIQD: 1.0000 | OROMUCOSAL | Status: DC | PRN

## 2020-01-03 MED ORDER — POVIDONE-IODINE 10 % EX SWAB
2.0000 "application " | Freq: Once | CUTANEOUS | Status: AC
  Administered 2020-01-03: 2 via TOPICAL

## 2020-01-03 MED ORDER — LATANOPROST 0.005 % OP SOLN
1.0000 [drp] | Freq: Every day | OPHTHALMIC | Status: DC
  Administered 2020-01-03: 1 [drp] via OPHTHALMIC
  Filled 2020-01-03: qty 2.5

## 2020-01-03 MED ORDER — ACETAMINOPHEN 325 MG PO TABS
325.0000 mg | ORAL_TABLET | Freq: Four times a day (QID) | ORAL | Status: DC | PRN
  Administered 2020-01-04 (×2): 650 mg via ORAL
  Filled 2020-01-03 (×2): qty 2

## 2020-01-03 MED ORDER — SODIUM CHLORIDE (PF) 0.9 % IJ SOLN
INTRAMUSCULAR | Status: AC
  Filled 2020-01-03: qty 10

## 2020-01-03 MED ORDER — 0.9 % SODIUM CHLORIDE (POUR BTL) OPTIME
TOPICAL | Status: DC | PRN
  Administered 2020-01-03: 1000 mL

## 2020-01-03 MED ORDER — SODIUM CHLORIDE (PF) 0.9 % IJ SOLN
INTRAMUSCULAR | Status: DC | PRN
  Administered 2020-01-03: 60 mL

## 2020-01-03 MED ORDER — ALPRAZOLAM 0.25 MG PO TABS: 0.2500 mg | ORAL_TABLET | Freq: Every evening | ORAL | Status: DC | PRN

## 2020-01-03 MED ORDER — FENTANYL CITRATE (PF) 100 MCG/2ML IJ SOLN
INTRAMUSCULAR | Status: DC | PRN
  Administered 2020-01-03: 50 ug via INTRAVENOUS
  Administered 2020-01-03 (×2): 25 ug via INTRAVENOUS

## 2020-01-03 MED ORDER — LACTATED RINGERS IV SOLN: INTRAVENOUS | Status: DC

## 2020-01-03 MED ORDER — ACETAMINOPHEN 10 MG/ML IV SOLN
1000.0000 mg | Freq: Four times a day (QID) | INTRAVENOUS | Status: DC
  Filled 2020-01-03: qty 100

## 2020-01-03 MED ORDER — DOCUSATE SODIUM 100 MG PO CAPS
100.0000 mg | ORAL_CAPSULE | Freq: Two times a day (BID) | ORAL | Status: DC
  Administered 2020-01-03 – 2020-01-04 (×2): 100 mg via ORAL
  Filled 2020-01-03 (×2): qty 1

## 2020-01-03 MED ORDER — TRANEXAMIC ACID-NACL 1000-0.7 MG/100ML-% IV SOLN
1000.0000 mg | INTRAVENOUS | Status: AC
  Administered 2020-01-03: 1000 mg via INTRAVENOUS
  Filled 2020-01-03: qty 100

## 2020-01-03 MED ORDER — CHLORHEXIDINE GLUCONATE 0.12 % MT SOLN
15.0000 mL | Freq: Once | OROMUCOSAL | Status: AC
  Administered 2020-01-03: 15 mL via OROMUCOSAL

## 2020-01-03 MED ORDER — BRIMONIDINE TARTRATE 0.2 % OP SOLN
1.0000 [drp] | Freq: Two times a day (BID) | OPHTHALMIC | Status: DC
  Administered 2020-01-03 – 2020-01-04 (×2): 1 [drp] via OPHTHALMIC
  Filled 2020-01-03: qty 5

## 2020-01-03 MED ORDER — SODIUM CHLORIDE 0.9 % IV SOLN: INTRAVENOUS | Status: DC

## 2020-01-03 MED ORDER — OXYCODONE HCL 5 MG PO TABS
10.0000 mg | ORAL_TABLET | ORAL | Status: DC | PRN
  Administered 2020-01-03 – 2020-01-04 (×6): 10 mg via ORAL
  Filled 2020-01-03 (×6): qty 2

## 2020-01-03 MED ORDER — FLEET ENEMA 7-19 GM/118ML RE ENEM: 1.0000 | ENEMA | Freq: Once | RECTAL | Status: DC | PRN

## 2020-01-03 MED ORDER — ROSUVASTATIN CALCIUM 5 MG PO TABS: 5.0000 mg | ORAL_TABLET | Freq: Every day | ORAL | Status: DC

## 2020-01-03 MED ORDER — ORAL CARE MOUTH RINSE: 15.0000 mL | Freq: Once | OROMUCOSAL | Status: AC

## 2020-01-03 MED ORDER — ONDANSETRON HCL 4 MG/2ML IJ SOLN: 4.0000 mg | Freq: Four times a day (QID) | INTRAMUSCULAR | Status: DC | PRN

## 2020-01-03 MED ORDER — PANTOPRAZOLE SODIUM 40 MG PO TBEC: 40.0000 mg | DELAYED_RELEASE_TABLET | Freq: Every day | ORAL | Status: DC | PRN

## 2020-01-03 MED ORDER — SODIUM CHLORIDE (PF) 0.9 % IJ SOLN
INTRAMUSCULAR | Status: AC
  Filled 2020-01-03: qty 50

## 2020-01-03 MED ORDER — CEFAZOLIN SODIUM-DEXTROSE 2-4 GM/100ML-% IV SOLN
2.0000 g | INTRAVENOUS | Status: AC
  Administered 2020-01-03: 2 g via INTRAVENOUS
  Filled 2020-01-03: qty 100

## 2020-01-03 MED ORDER — METOCLOPRAMIDE HCL 5 MG PO TABS: 5.0000 mg | ORAL_TABLET | Freq: Three times a day (TID) | ORAL | Status: DC | PRN

## 2020-01-03 MED ORDER — FENTANYL CITRATE (PF) 100 MCG/2ML IJ SOLN
25.0000 ug | INTRAMUSCULAR | Status: DC | PRN
  Administered 2020-01-03 (×2): 50 ug via INTRAVENOUS

## 2020-01-03 MED ORDER — FENTANYL CITRATE (PF) 100 MCG/2ML IJ SOLN
INTRAMUSCULAR | Status: AC
  Filled 2020-01-03: qty 2

## 2020-01-03 MED ORDER — BUPIVACAINE LIPOSOME 1.3 % IJ SUSP
INTRAMUSCULAR | Status: DC | PRN
  Administered 2020-01-03: 20 mL

## 2020-01-03 MED ORDER — CLONIDINE HCL (ANALGESIA) 100 MCG/ML EP SOLN
EPIDURAL | Status: DC | PRN
  Administered 2020-01-03: 100 ug

## 2020-01-03 MED ORDER — METHOCARBAMOL 500 MG PO TABS
500.0000 mg | ORAL_TABLET | Freq: Four times a day (QID) | ORAL | Status: DC | PRN
  Administered 2020-01-03 – 2020-01-04 (×3): 500 mg via ORAL
  Filled 2020-01-03 (×3): qty 1

## 2020-01-03 MED ORDER — STERILE WATER FOR IRRIGATION IR SOLN
Status: DC | PRN
  Administered 2020-01-03: 1000 mL

## 2020-01-03 MED ORDER — METHOCARBAMOL 500 MG IVPB - SIMPLE MED
INTRAVENOUS | Status: AC
  Filled 2020-01-03: qty 50

## 2020-01-03 MED ORDER — FENTANYL CITRATE (PF) 100 MCG/2ML IJ SOLN
INTRAMUSCULAR | Status: AC
  Administered 2020-01-03: 50 ug via INTRAVENOUS
  Filled 2020-01-03: qty 2

## 2020-01-03 MED ORDER — ESCITALOPRAM OXALATE 10 MG PO TABS
10.0000 mg | ORAL_TABLET | Freq: Every day | ORAL | Status: DC
  Administered 2020-01-03: 10 mg via ORAL
  Filled 2020-01-03: qty 1

## 2020-01-03 MED ORDER — CEFAZOLIN SODIUM-DEXTROSE 2-4 GM/100ML-% IV SOLN
2.0000 g | Freq: Four times a day (QID) | INTRAVENOUS | Status: AC
  Administered 2020-01-03 (×2): 2 g via INTRAVENOUS
  Filled 2020-01-03 (×2): qty 100

## 2020-01-03 MED ORDER — POLYETHYLENE GLYCOL 3350 17 G PO PACK: 17.0000 g | PACK | Freq: Every day | ORAL | Status: DC | PRN

## 2020-01-03 MED ORDER — GLIPIZIDE ER 2.5 MG PO TB24
2.5000 mg | ORAL_TABLET | Freq: Every day | ORAL | Status: DC
  Administered 2020-01-04: 2.5 mg via ORAL
  Filled 2020-01-03: qty 1

## 2020-01-03 MED ORDER — OXYCODONE HCL 5 MG PO TABS
5.0000 mg | ORAL_TABLET | ORAL | Status: DC | PRN
  Administered 2020-01-04: 5 mg via ORAL
  Filled 2020-01-03: qty 1

## 2020-01-03 MED ORDER — MENTHOL 3 MG MT LOZG: 1.0000 | LOZENGE | OROMUCOSAL | Status: DC | PRN

## 2020-01-03 MED ORDER — LOSARTAN POTASSIUM 50 MG PO TABS: 50.0000 mg | ORAL_TABLET | Freq: Every day | ORAL | Status: DC

## 2020-01-03 SURGICAL SUPPLY — 59 items
ATTUNE PSFEM LTSZ5 NARCEM KNEE (Femur) ×3 IMPLANT
ATTUNE PSRP INSR SZ5 8 KNEE (Insert) ×1 IMPLANT
ATTUNE PSRP INSR SZ5 8MM KNEE (Insert) ×1 IMPLANT
BAG SPEC THK2 15X12 ZIP CLS (MISCELLANEOUS) ×1
BAG ZIPLOCK 12X15 (MISCELLANEOUS) ×3 IMPLANT
BASEPLATE TIBIAL ROTATING SZ 4 (Orthopedic Implant) ×2 IMPLANT
BLADE SAG 18X100X1.27 (BLADE) ×3 IMPLANT
BLADE SAW SGTL 11.0X1.19X90.0M (BLADE) ×3 IMPLANT
BLADE SURG SZ10 CARB STEEL (BLADE) ×6 IMPLANT
BNDG CMPR MED 10X6 ELC LF (GAUZE/BANDAGES/DRESSINGS) ×1
BNDG ELASTIC 6X10 VLCR STRL LF (GAUZE/BANDAGES/DRESSINGS) ×2 IMPLANT
BNDG ELASTIC 6X5.8 VLCR STR LF (GAUZE/BANDAGES/DRESSINGS) ×3 IMPLANT
BOWL SMART MIX CTS (DISPOSABLE) ×3 IMPLANT
CEMENT HV SMART SET (Cement) ×6 IMPLANT
CLOSURE STERI-STRIP 1/2X4 (GAUZE/BANDAGES/DRESSINGS) ×1
CLOSURE WOUND 1/2 X4 (GAUZE/BANDAGES/DRESSINGS) ×2
CLSR STERI-STRIP ANTIMIC 1/2X4 (GAUZE/BANDAGES/DRESSINGS) ×2 IMPLANT
COVER SURGICAL LIGHT HANDLE (MISCELLANEOUS) ×3 IMPLANT
COVER WAND RF STERILE (DRAPES) IMPLANT
CUFF TOURN SGL QUICK 34 (TOURNIQUET CUFF) ×3
CUFF TRNQT CYL 34X4.125X (TOURNIQUET CUFF) ×1 IMPLANT
DECANTER SPIKE VIAL GLASS SM (MISCELLANEOUS) ×3 IMPLANT
DRAPE U-SHAPE 47X51 STRL (DRAPES) ×3 IMPLANT
DRSG AQUACEL AG ADV 3.5X10 (GAUZE/BANDAGES/DRESSINGS) ×3 IMPLANT
DURAPREP 26ML APPLICATOR (WOUND CARE) ×3 IMPLANT
ELECT REM PT RETURN 15FT ADLT (MISCELLANEOUS) ×3 IMPLANT
GLOVE BIO SURGEON STRL SZ7 (GLOVE) ×3 IMPLANT
GLOVE BIO SURGEON STRL SZ8 (GLOVE) ×3 IMPLANT
GLOVE BIOGEL PI IND STRL 7.0 (GLOVE) ×1 IMPLANT
GLOVE BIOGEL PI IND STRL 8 (GLOVE) ×1 IMPLANT
GLOVE BIOGEL PI INDICATOR 7.0 (GLOVE) ×2
GLOVE BIOGEL PI INDICATOR 8 (GLOVE) ×2
GOWN STRL REUS W/TWL LRG LVL3 (GOWN DISPOSABLE) ×6 IMPLANT
HANDPIECE INTERPULSE COAX TIP (DISPOSABLE) ×3
HOLDER FOLEY CATH W/STRAP (MISCELLANEOUS) IMPLANT
IMMOBILIZER KNEE 20 (SOFTGOODS) ×3
IMMOBILIZER KNEE 20 THIGH 36 (SOFTGOODS) ×1 IMPLANT
KIT TURNOVER KIT A (KITS) IMPLANT
MANIFOLD NEPTUNE II (INSTRUMENTS) ×3 IMPLANT
NS IRRIG 1000ML POUR BTL (IV SOLUTION) ×3 IMPLANT
PACK TOTAL KNEE CUSTOM (KITS) ×3 IMPLANT
PADDING CAST ABS 6INX4YD NS (CAST SUPPLIES) ×2
PADDING CAST ABS COTTON 6X4 NS (CAST SUPPLIES) IMPLANT
PADDING CAST COTTON 6X4 STRL (CAST SUPPLIES) ×6 IMPLANT
PATELLA MEDIAL ATTUN 35MM KNEE (Knees) ×2 IMPLANT
PENCIL SMOKE EVACUATOR (MISCELLANEOUS) ×3 IMPLANT
PIN DRILL FIX HALF THREAD (BIT) ×2 IMPLANT
PIN STEINMAN FIXATION KNEE (PIN) ×3 IMPLANT
PROTECTOR NERVE ULNAR (MISCELLANEOUS) ×3 IMPLANT
SET HNDPC FAN SPRY TIP SCT (DISPOSABLE) ×1 IMPLANT
STRIP CLOSURE SKIN 1/2X4 (GAUZE/BANDAGES/DRESSINGS) ×4 IMPLANT
SUT MNCRL AB 4-0 PS2 18 (SUTURE) ×3 IMPLANT
SUT STRATAFIX 0 PDS 27 VIOLET (SUTURE) ×3
SUT VIC AB 2-0 CT1 27 (SUTURE) ×9
SUT VIC AB 2-0 CT1 TAPERPNT 27 (SUTURE) ×3 IMPLANT
SUTURE STRATFX 0 PDS 27 VIOLET (SUTURE) ×1 IMPLANT
TRAY FOLEY MTR SLVR 16FR STAT (SET/KITS/TRAYS/PACK) ×3 IMPLANT
WATER STERILE IRR 1000ML POUR (IV SOLUTION) ×6 IMPLANT
WRAP KNEE MAXI GEL POST OP (GAUZE/BANDAGES/DRESSINGS) ×3 IMPLANT

## 2020-01-03 NOTE — Anesthesia Postprocedure Evaluation (Signed)
Anesthesia Post Note  Patient: Tammy Pineda  Procedure(s) Performed: TOTAL KNEE ARTHROPLASTY (Left Knee)     Patient location during evaluation: PACU Anesthesia Type: Regional and General Level of consciousness: awake and alert Pain management: pain level controlled Vital Signs Assessment: post-procedure vital signs reviewed and stable Respiratory status: spontaneous breathing, nonlabored ventilation, respiratory function stable and patient connected to nasal cannula oxygen Cardiovascular status: blood pressure returned to baseline and stable Postop Assessment: no apparent nausea or vomiting Anesthetic complications: no   No complications documented.  Last Vitals:  Vitals:   01/03/20 1400 01/03/20 1423  BP: (!) 112/48 (!) 121/53  Pulse: 62 (!) 59  Resp: 13 18  Temp: (!) 36.4 C 36.6 C  SpO2: 94% 96%    Last Pain:  Vitals:   01/03/20 1423  TempSrc: Oral  PainSc: 1                  Rogelio Winbush P Milanya Sunderland

## 2020-01-03 NOTE — Anesthesia Procedure Notes (Signed)
Date/Time: 01/03/2020 10:59 AM Performed by: Cynda Familia, CRNA Oxygen Delivery Method: Simple face mask Placement Confirmation: positive ETCO2 and breath sounds checked- equal and bilateral Dental Injury: Teeth and Oropharynx as per pre-operative assessment

## 2020-01-03 NOTE — Progress Notes (Signed)
Orthopedic Tech Progress Note Patient Details:  Tammy Pineda [birth year] [id number] Patient already has knee immobilizer. CPM Left Knee CPM Left Knee: On Left Knee Flexion (Degrees): 10 Left Knee Extension (Degrees): 40  Post Interventions Patient Tolerated: Well Instructions Provided: Care of device  Braulio Bosch 01/03/2020, 11:55 AM

## 2020-01-03 NOTE — Transfer of Care (Signed)
Immediate Anesthesia Transfer of Care Note  Patient: Tammy Pineda  Procedure(s) Performed: TOTAL KNEE ARTHROPLASTY (Left Knee)  Patient Location: PACU  Anesthesia Type:General  Level of Consciousness: sedated  Airway & Oxygen Therapy: Patient Spontanous Breathing and Patient connected to face mask oxygen  Post-op Assessment: Report given to RN and Post -op Vital signs reviewed and stable  Post vital signs: Reviewed and stable  Last Vitals:  Vitals Value Taken Time  BP 131/63 01/03/20 1105  Temp    Pulse 59 01/03/20 1107  Resp 15 01/03/20 1107  SpO2 93 % 01/03/20 1107  Vitals shown include unvalidated device data.  Last Pain:  Vitals:   01/03/20 0830  TempSrc: Oral  PainSc:       Patients Stated Pain Goal: 4 (75/30/05 1102)  Complications: No complications documented.

## 2020-01-03 NOTE — Evaluation (Signed)
Physical Therapy Evaluation Patient Details Name: Tammy Pineda MRN: [id number] DOB: 18-May-1945 Today's Date: 01/03/2020   History of Present Illness  Patient is 74 y.o. female s/p TKA on 01/03/20 with PMH significant for HTN, Glaucoma, GERD, OA, CKD, CAD, depression, anxiety, DM.    Clinical Impression  Tammy Pineda is a 74 y.o. female POD 0 s/p Lt TKA. Patient reports independence with mobility at baseline. Patient is now limited by functional impairments (see PT problem list below) and requires min assist for transfers and gait with RW. Patient was able to take several small steps with RW and min assist to move from bed>recliner. Patient limited by pain and unable to increase distance today. Patient instructed in exercise to facilitate circulation. Patient will benefit from continued skilled PT interventions to address impairments and progress towards PLOF. Acute PT will follow to progress mobility and stair training in preparation for safe discharge home.     Follow Up Recommendations Follow surgeon's recommendation for DC plan and follow-up therapies    Equipment Recommendations  None recommended by PT    Recommendations for Other Services       Precautions / Restrictions Precautions Precautions: Fall Restrictions Weight Bearing Restrictions: No Other Position/Activity Restrictions: WBAT      Mobility  Bed Mobility Overal bed mobility: Needs Assistance Bed Mobility: Supine to Sit     Supine to sit: Min assist;HOB elevated     General bed mobility comments: light assist to bring LE's off EOB, cues to use bed rail to raise trunk.    Transfers Overall transfer level: Needs assistance Equipment used: Rolling walker (2 wheeled) Transfers: Sit to/from [facility] Sit to Stand: Min assist Stand pivot transfers: Min assist       General transfer comment: cues for technique with RW for power up and light assist to complete rise. Min assist to steady  and manage RW to take small steps to recliner.   Ambulation/Gait Ambulation/Gait assistance: Min assist Gait Distance (Feet): 4 Feet Assistive device: Rolling walker (2 wheeled) Gait Pattern/deviations: Step-to pattern;Decreased stride length;Decreased weight shift to left;Antalgic Gait velocity: decr   General Gait Details: cues for walker position, assist to manage and cues to use UE's to prevent Lt knee buckling.   Stairs            Wheelchair Mobility    Modified Rankin (Stroke Patients Only)       Balance Overall balance assessment: Needs assistance Sitting-balance support: Feet supported Sitting balance-Leahy Scale: Good     Standing balance support: During functional activity;Bilateral upper extremity supported Standing balance-Leahy Scale: Poor                               Pertinent Vitals/Pain Pain Assessment: Faces Faces Pain Scale: Hurts little more Pain Location: Lt knee Pain Descriptors / Indicators: Aching;Discomfort Pain Intervention(s): Limited activity within patient's tolerance;Monitored during session;Repositioned;Ice applied    Home Living Family/patient expects to be discharged to:: Private residence Living Arrangements: Spouse/significant other Available Help at Discharge: Family Type of Home: House Home Access: Level entry     Home Layout: One level [place]: Walker - 2 wheels;Grab bars - toilet;Grab bars - tub/shower;Cane - single point      Prior Function Level of Independence: Independent               Hand Dominance   Dominant Hand: Right    Extremity/Trunk Assessment   Upper Extremity  Assessment Upper Extremity Assessment: Overall WFL for tasks assessed    Lower Extremity Assessment Lower Extremity Assessment: LLE deficits/detail LLE Deficits / Details: no extensor lag with SLR however limited height, 3-/5  LLE Sensation: WNL LLE Coordination: WNL    Cervical / Trunk Assessment Cervical /  Trunk Assessment: Normal  Communication   Communication: No difficulties  Cognition Arousal/Alertness: Awake/alert Behavior During Therapy: WFL for tasks assessed/performed Overall Cognitive Status: Within Functional Limits for tasks assessed                                        General Comments      Exercises Total Joint Exercises Ankle Circles/Pumps: AROM;Both;20 reps;Seated   Assessment/Plan    PT Assessment Patient needs continued PT services  PT Problem List Decreased strength;Decreased range of motion;Decreased activity tolerance;Decreased balance;Decreased mobility;Decreased knowledge of use of DME;Decreased knowledge of precautions;Pain       PT Treatment Interventions DME instruction;Gait training;Stair training;Functional mobility training;Therapeutic activities;Therapeutic exercise;Balance training;Patient/family education    PT Goals (Current goals can be found in the Care Plan section)  Acute Rehab PT Goals Patient Stated Goal: regain independence PT Goal Formulation: With patient Time For Goal Achievement: 01/10/20 Potential to Achieve Goals: Good    Frequency 7X/week   Barriers to discharge        Co-evaluation               AM-PAC PT "6 Clicks" Mobility  Outcome Measure Help needed turning from your back to your side while in a flat bed without using bedrails?: None Help needed moving from lying on your back to sitting on the side of a flat bed without using bedrails?: A Little Help needed moving to and from a bed to a chair (including a wheelchair)?: A Little Help needed standing up from a chair using your arms (e.g., wheelchair or bedside chair)?: A Little Help needed to walk in hospital room?: A Little Help needed climbing 3-5 steps with a railing? : A Little 6 Click Score: 19    End of Session Equipment Utilized During Treatment: Gait belt Activity Tolerance: Patient tolerated treatment well;Patient limited by  pain Patient left: in chair;with call bell/phone within reach;with chair alarm set;with family/visitor present Nurse Communication: Mobility status PT Visit Diagnosis: Muscle weakness (generalized) (M62.81);Difficulty in walking, not elsewhere classified (R26.2)    Time: 3500-9381 PT Time Calculation (min) (ACUTE ONLY): 22 min   Charges:   PT Evaluation $PT Eval Low Complexity: 1 Low          Verner Mould, DPT Acute Rehabilitation Services  Office [phone] Pager [phone]  01/03/2020 4:32 PM

## 2020-01-03 NOTE — Anesthesia Procedure Notes (Signed)
Procedure Name: LMA Insertion Date/Time: 01/03/2020 9:32 AM Performed by: [place] Ami Mally C, CRNA Pre-anesthesia Checklist: Patient identified, Emergency Drugs available, Suction available and Patient being monitored Patient Re-evaluated:Patient Re-evaluated prior to induction Oxygen Delivery Method: Circle system utilized Preoxygenation: Pre-oxygenation with 100% oxygen Induction Type: IV induction Ventilation: Mask ventilation without difficulty LMA: LMA inserted LMA Size: 4.0 Number of attempts: 1 Airway Equipment and Method: Bite block Placement Confirmation: positive ETCO2 Tube secured with: Tape Dental Injury: Teeth and Oropharynx as per pre-operative assessment

## 2020-01-03 NOTE — Interval H&P Note (Signed)
History and Physical Interval Note:  01/03/2020 8:28 AM  Tammy Pineda  has presented today for surgery, with the diagnosis of left knee osteoarthritis.  The various methods of treatment have been discussed with the patient and family. After consideration of risks, benefits and other options for treatment, the patient has consented to  Procedure(s) with comments: TOTAL KNEE ARTHROPLASTY (Left) - 30mn as a surgical intervention.  The patient's history has been reviewed, patient examined, no change in status, stable for surgery.  I have reviewed the patient's chart and labs.  Questions were answered to the patient's satisfaction.     FPilar PlateAluisio

## 2020-01-03 NOTE — Op Note (Addendum)
OPERATIVE REPORT-TOTAL KNEE ARTHROPLASTY   Pre-operative diagnosis- Osteoarthritis  Left knee(s)  Post-operative diagnosis- Osteoarthritis Left knee(s)  Procedure-  Left  Total Knee Arthroplasty  Surgeon- Dione Plover. Sharde Gover, MD  Assistant- Theresa Duty, PA-C   Anesthesia-  Adductor canal block and General  EBL- 25 ml   Drains None  Tourniquet time- 33 minutes @ 462 mm Hg  Complications- None  Condition-PACU - hemodynamically stable.   Brief Clinical Note  Tammy Pineda is a 74 y.o. year old female with end stage OA of her left knee with progressively worsening pain and dysfunction. She has constant pain, with activity and at rest and significant functional deficits with difficulties even with ADLs. She has had extensive non-op management including analgesics, injections of cortisone and viscosupplements, and home exercise program, but remains in significant pain with significant dysfunction. Radiographs show bone on bone arthritis medial and patellofemoral. She presents now for left Total Knee Arthroplasty.    Procedure in detail---   The patient is brought into the operating room and positioned supine on the operating table. After successful administration of Adductor canal block and General,   a tourniquet is placed high on the  Left thigh(s) and the lower extremity is prepped and draped in the usual sterile fashion. Time out is performed by the operating team and then the  Left lower extremity is wrapped in Esmarch, knee flexed and the tourniquet inflated to 300 mmHg.       A midline incision is made with a ten blade through the subcutaneous tissue to the level of the extensor mechanism. A fresh blade is used to make a medial parapatellar arthrotomy. Soft tissue over the proximal medial tibia is subperiosteally elevated to the joint line with a knife and into the semimembranosus bursa with a Cobb elevator. Soft tissue over the proximal lateral tibia is elevated with attention  being paid to avoiding the patellar tendon on the tibial tubercle. The patella is everted, knee flexed 90 degrees and the ACL and PCL are removed. Findings are bone on bone medial and patellofemoral with large global osteophytes.        The drill is used to create a starting hole in the distal femur and the canal is thoroughly irrigated with sterile saline to remove the fatty contents. The 5 degree Left  valgus alignment guide is placed into the femoral canal and the distal femoral cutting block is pinned to remove 9 mm off the distal femur. Resection is made with an oscillating saw.      The tibia is subluxed forward and the menisci are removed. The extramedullary alignment guide is placed referencing proximally at the medial aspect of the tibial tubercle and distally along the second metatarsal axis and tibial crest. The block is pinned to remove 4m off the more deficient medial  side. Resection is made with an oscillating saw. Size 4is the most appropriate size for the tibia and the proximal tibia is prepared with the modular drill and keel punch for that size.      The femoral sizing guide is placed and size 5 is most appropriate. Rotation is marked off the epicondylar axis and confirmed by creating a rectangular flexion gap at 90 degrees. The size 5 cutting block is pinned in this rotation and the anterior, posterior and chamfer cuts are made with the oscillating saw. The intercondylar block is then placed and that cut is made.      Trial size 4 tibial component, trial size 5 narrow  posterior stabilized femur and a 8  mm posterior stabilized rotating platform insert trial is placed. Full extension is achieved with excellent varus/valgus and anterior/posterior balance throughout full range of motion. The patella is everted and thickness measured to be 22  mm. Free hand resection is taken to 12 mm, a 35 template is placed, lug holes are drilled, trial patella is placed, and it tracks normally. Osteophytes  are removed off the posterior femur with the trial in place. All trials are removed and the cut bone surfaces prepared with pulsatile lavage. Cement is mixed and once ready for implantation, the size 4 tibial implant, size  5 narrow posterior stabilized femoral component, and the size 35 patella are cemented in place and the patella is held with the clamp. The trial insert is placed and the knee held in full extension. The Exparel (20 ml mixed with 60 ml saline) is injected into the extensor mechanism, posterior capsule, medial and lateral gutters and subcutaneous tissues.  All extruded cement is removed and once the cement is hard the permanent 8 mm posterior stabilized rotating platform insert is placed into the tibial tray.      The wound is copiously irrigated with saline solution and the extensor mechanism closed with # 0 Stratofix suture. The tourniquet is released for a total tourniquet time of 33  minutes. Flexion against gravity is 140 degrees and the patella tracks normally. Subcutaneous tissue is closed with 2.0 vicryl and subcuticular with running 4.0 Monocryl. The incision is cleaned and dried and steri-strips and a bulky sterile dressing are applied. The limb is placed into a knee immobilizer and the patient is awakened and transported to recovery in stable condition.      Please note that a surgical assistant was a medical necessity for this procedure in order to perform it in a safe and expeditious manner. Surgical assistant was necessary to retract the ligaments and vital neurovascular structures to prevent injury to them and also necessary for proper positioning of the limb to allow for anatomic placement of the prosthesis.   Dione Plover Alyn Jurney, MD    01/03/2020, 10:29 AM

## 2020-01-03 NOTE — Plan of Care (Signed)
  Problem: Education: Goal: Knowledge of General Education information will improve Description: Including pain rating scale, medication(s)/side effects and non-pharmacologic comfort measures Outcome: Progressing   Problem: Activity: Goal: Risk for activity intolerance will decrease Outcome: Progressing   Problem: Nutrition: Goal: Adequate nutrition will be maintained Outcome: Progressing   Problem: Elimination: Goal: Will not experience complications related to bowel motility Outcome: Progressing   Problem: Pain Managment: Goal: General experience of comfort will improve Outcome: Progressing   Problem: Safety: Goal: Ability to remain free from injury will improve Outcome: Progressing   Problem: Skin Integrity: Goal: Risk for impaired skin integrity will decrease Outcome: Progressing   Problem: Education: Goal: Knowledge of the prescribed therapeutic regimen will improve Outcome: Progressing   Problem: Activity: Goal: Ability to avoid complications of mobility impairment will improve Outcome: Progressing   Problem: Pain Management: Goal: Pain level will decrease with appropriate interventions Outcome: Progressing

## 2020-01-03 NOTE — Anesthesia Procedure Notes (Signed)
Anesthesia Regional Block: Adductor canal block   Pre-Anesthetic Checklist: ,, timeout performed, Correct Patient, Correct Site, Correct Laterality, Correct Procedure,, site marked, risks and benefits discussed, Surgical consent,  Pre-op evaluation,  At surgeon's request and post-op pain management  Laterality: Left  Prep: chloraprep       Needles:  Injection technique: Single-shot  Needle Type: Echogenic Stimulator Needle     Needle Length: 10cm  Needle Gauge: 20     Additional Needles:   Procedures:,,,, ultrasound used (permanent image in chart),,,,  Narrative:  Start time: 01/03/2020 9:00 AM End time: 01/03/2020 9:10 AM Injection made incrementally with aspirations every 5 mL.  Performed by: Personally  Anesthesiologist: Murvin Natal, MD  Additional Notes: Functioning IV was confirmed and monitors were applied. A time-out was performed. Hand hygiene and sterile gloves were used. The thigh was placed in a frog-leg position and prepped in a sterile fashion. A 159m 20ga BBraun echogenic stimulator needle was placed using ultrasound guidance.  Negative aspiration and negative test dose prior to incremental administration of local anesthetic. The patient tolerated the procedure well.

## 2020-01-03 NOTE — Progress Notes (Signed)
Assisted Dr. Roanna Banning with left, ultrasound guided, adductor canal block. Side rails up, monitors on throughout procedure. See vital signs in flow sheet. Tolerated Procedure well.

## 2020-01-03 NOTE — Discharge Instructions (Signed)
Gaynelle Arabian, MD Total Joint Specialist [facility] [place] [address], [address] [place], [place] [address] [phone]  TOTAL KNEE REPLACEMENT POSTOPERATIVE DIRECTIONS    Knee Rehabilitation, Guidelines Following Surgery  Results after knee surgery are often greatly improved when you follow the exercise, range of motion and muscle strengthening exercises prescribed by your doctor. Safety measures are also important to protect the knee from further injury. If any of these exercises cause you to have increased pain or swelling in your knee joint, decrease the amount until you are comfortable again and slowly increase them. If you have problems or questions, call your caregiver or physical therapist for advice.   BLOOD CLOT PREVENTION . Take a 325 mg Aspirin two times a day for three weeks following surgery. Then take an 81 mg Aspirin once a day for three weeks. Then discontinue Aspirin. Dennis Bast may resume your vitamins/supplements upon discharge from the hospital. . Do not take any NSAIDs (Advil, Aleve, Ibuprofen, Meloxicam, etc.) until you have discontinued the 325 mg Aspirin.  HOME CARE INSTRUCTIONS  . Remove items at home which could result in a fall. This includes throw rugs or furniture in walking pathways.  . ICE to the affected knee as much as tolerated. Icing helps control swelling. If the swelling is well controlled you will be more comfortable and rehab easier. Continue to use ice on the knee for pain and swelling from surgery. You may notice swelling that will progress down to the foot and ankle. This is normal after surgery. Elevate the leg when you are not up walking on it.    . Continue to use the breathing machine which will help keep your temperature down. It is common for your temperature to cycle up and down following surgery, especially at night when you are not up moving around and exerting yourself. The breathing machine keeps your lungs expanded and your  temperature down. . Do not place pillow under the operative knee, focus on keeping the knee straight while resting  DIET You may resume your previous home diet once you are discharged from the hospital.  DRESSING / [place] / SHOWERING . Keep your bulky bandage on for 2 days. On the third post-operative day you may remove the Ace bandage and gauze. There is a waterproof adhesive bandage on your skin which will stay in place until your first follow-up appointment. Once you remove this you will not need to place another bandage . You may begin showering 3 days following surgery, but do not submerge the incision under water.  ACTIVITY For the first 5 days, the key is rest and control of pain and swelling . Do your home exercises twice a day starting on post-operative day 3. On the days you go to physical therapy, just do the home exercises once that day. . You should rest, ice and elevate the leg for 50 minutes out of every hour. Get up and walk/stretch for 10 minutes per hour. After 5 days you can increase your activity slowly as tolerated. . Walk with your walker as instructed. Use the walker until you are comfortable transitioning to a cane. Walk with the cane in the opposite hand of the operative leg. You may discontinue the cane once you are comfortable and walking steadily. . Avoid periods of inactivity such as sitting longer than an hour when not asleep. This helps prevent blood clots.  . You may discontinue the knee immobilizer once you are able to perform a straight  leg raise while lying down. . You may resume a sexual relationship in one month or when given the OK by your doctor.  . You may return to work once you are cleared by your doctor.  . Do not drive a car for 6 weeks or until released by your surgeon.  . Do not drive while taking narcotics.  TED HOSE STOCKINGS Wear the elastic stockings on both legs for three weeks following surgery during the day. You may remove them at night  for sleeping.  WEIGHT BEARING Weight bearing as tolerated with assist device (walker, cane, etc) as directed, use it as long as suggested by your surgeon or therapist, typically at least 4-6 weeks.  POSTOPERATIVE CONSTIPATION PROTOCOL Constipation - defined medically as fewer than three stools per week and severe constipation as less than one stool per week.  One of the most common issues patients have following surgery is constipation.  Even if you have a regular bowel pattern at home, your normal regimen is likely to be disrupted due to multiple reasons following surgery.  Combination of anesthesia, postoperative narcotics, change in appetite and fluid intake all can affect your bowels.  In order to avoid complications following surgery, here are some recommendations in order to help you during your recovery period.  . Colace (docusate) - Pick up an over-the-counter form of Colace or another stool softener and take twice a day as long as you are requiring postoperative pain medications.  Take with a full glass of water daily.  If you experience loose stools or diarrhea, hold the colace until you stool forms back up. If your symptoms do not get better within 1 week or if they get worse, check with your doctor. . Dulcolax (bisacodyl) - Pick up over-the-counter and take as directed by the product packaging as needed to assist with the movement of your bowels.  Take with a full glass of water.  Use this product as needed if not relieved by Colace only.  . MiraLax (polyethylene glycol) - Pick up over-the-counter to have on hand. MiraLax is a solution that will increase the amount of water in your bowels to assist with bowel movements.  Take as directed and can mix with a glass of water, juice, soda, coffee, or tea. Take if you go more than two days without a movement. Do not use MiraLax more than once per day. Call your doctor if you are still constipated or irregular after using this medication for 7 days  in a row.  If you continue to have problems with postoperative constipation, please contact the office for further assistance and recommendations.  If you experience "the worst abdominal pain ever" or develop nausea or vomiting, please contact the office immediatly for further recommendations for treatment.  ITCHING If you experience itching with your medications, try taking only a single pain pill, or even half a pain pill at a time.  You can also use Benadryl over the counter for itching or also to help with sleep.   MEDICATIONS See your medication summary on the "After Visit Summary" that the nursing staff will review with you prior to discharge.  You may have some home medications which will be placed on hold until you complete the course of blood thinner medication.  It is important for you to complete the blood thinner medication as prescribed by your surgeon.  Continue your approved medications as instructed at time of discharge.  PRECAUTIONS . If you experience chest pain or shortness of  breath - call 911 immediately for transfer to the hospital emergency department.  . If you develop a fever greater that 101 F, purulent drainage from wound, increased redness or drainage from wound, foul odor from the wound/dressing, or calf pain - CONTACT YOUR SURGEON.                                                   FOLLOW-UP APPOINTMENTS Make sure you keep all of your appointments after your operation with your surgeon and caregivers. You should call the office at the above phone number and make an appointment for approximately two weeks after the date of your surgery or on the date instructed by your surgeon outlined in the "After Visit Summary".  RANGE OF MOTION AND STRENGTHENING EXERCISES  Rehabilitation of the knee is important following a knee injury or an operation. After just a few days of immobilization, the muscles of the thigh which control the knee become weakened and shrink (atrophy). Knee  exercises are designed to build up the tone and strength of the thigh muscles and to improve knee motion. Often times heat used for twenty to thirty minutes before working out will loosen up your tissues and help with improving the range of motion but do not use heat for the first two weeks following surgery. These exercises can be done on a training (exercise) mat, on the floor, on a table or on a bed. Use what ever works the best and is most comfortable for you Knee exercises include:  . Leg Lifts - While your knee is still immobilized in a splint or cast, you can do straight leg raises. Lift the leg to 60 degrees, hold for 3 sec, and slowly lower the leg. Repeat 10-20 times 2-3 times daily. Perform this exercise against resistance later as your knee gets better.  Javier Docker and Hamstring Sets - Tighten up the muscle on the front of the thigh (Quad) and hold for 5-10 sec. Repeat this 10-20 times hourly. Hamstring sets are done by pushing the foot backward against an object and holding for 5-10 sec. Repeat as with quad sets.   Leg Slides: Lying on your back, slowly slide your foot toward your buttocks, bending your knee up off the floor (only go as far as is comfortable). Then slowly slide your foot back down until your leg is flat on the floor again.  Angel Wings: Lying on your back spread your legs to the side as far apart as you can without causing discomfort.  A rehabilitation program following serious knee injuries can speed recovery and prevent re-injury in the future due to weakened muscles. Contact your doctor or a physical therapist for more information on knee rehabilitation.   IF YOU ARE TRANSFERRED TO A SKILLED REHAB FACILITY If the patient is transferred to a skilled rehab facility following release from the hospital, a list of the current medications will be sent to the facility for the patient to continue.  When discharged from the skilled rehab facility, please have the facility set up the  patient's [place] prior to being released. Also, the skilled facility will be responsible for providing the patient with their medications at time of release from the facility to include their pain medication, the muscle relaxants, and their blood thinner medication. If the patient is still at the  rehab facility at time of the two week follow up appointment, the skilled rehab facility will also need to assist the patient in arranging follow up appointment in our office and any transportation needs.  MAKE SURE YOU:  . Understand these instructions.  . Get help right away if you are not doing well or get worse.   DENTAL ANTIBIOTICS:  In most cases prophylactic antibiotics for Dental procdeures after total joint surgery are not necessary.  Exceptions are as follows:  1. History of prior total joint infection  2. Severely immunocompromised (Organ Transplant, cancer chemotherapy, Rheumatoid biologic meds such as Bradley)  3. Poorly controlled diabetes (A1C &gt; 8.0, blood glucose over 200)  If you have one of these conditions, contact your surgeon for an antibiotic prescription, prior to your dental procedure.    Pick up stool softner and laxative for home use following surgery while on pain medications. Do not submerge incision under water. Please use good hand washing techniques while changing dressing each day. May shower starting three days after surgery. Please use a clean towel to pat the incision dry following showers. Continue to use ice for pain and swelling after surgery. Do not use any lotions or creams on the incision until instructed by your surgeon.

## 2020-01-03 NOTE — Care Plan (Signed)
Ortho Bundle Case Management Note  Patient Details  Name: Tammy Pineda MRN: [id number] Date of Birth: Jan 17, 1946  L TKA on 01-03-20 DCP:  Home with husband.  3 story home with 0 ste. DME:  No needs.  Has a RW and elevated toilets with grab bar. Doesn't need a 3-in-1. PT:  [facility]. PT eval scheduled on 01-06-20 at 11:15 am.                   DME Arranged:  N/A DME Agency:  NA  HH Arranged:  NA HH Agency:  NA  Additional Comments: Please contact me with any questions of if this plan should need to change.  Marianne Sofia, RN,CCM [facility]  [phone] 01/03/2020, 11:20 AM

## 2020-01-04 DIAGNOSIS — N189 Chronic kidney disease, unspecified: Secondary | ICD-10-CM | POA: Diagnosis not present

## 2020-01-04 DIAGNOSIS — E7800 Pure hypercholesterolemia, unspecified: Secondary | ICD-10-CM | POA: Diagnosis not present

## 2020-01-04 DIAGNOSIS — I2510 Atherosclerotic heart disease of native coronary artery without angina pectoris: Secondary | ICD-10-CM | POA: Diagnosis not present

## 2020-01-04 DIAGNOSIS — K219 Gastro-esophageal reflux disease without esophagitis: Secondary | ICD-10-CM | POA: Diagnosis not present

## 2020-01-04 DIAGNOSIS — N183 Chronic kidney disease, stage 3 unspecified: Secondary | ICD-10-CM | POA: Diagnosis not present

## 2020-01-04 DIAGNOSIS — M1712 Unilateral primary osteoarthritis, left knee: Secondary | ICD-10-CM | POA: Diagnosis not present

## 2020-01-04 DIAGNOSIS — I10 Essential (primary) hypertension: Secondary | ICD-10-CM | POA: Diagnosis not present

## 2020-01-04 DIAGNOSIS — Z79899 Other long term (current) drug therapy: Secondary | ICD-10-CM | POA: Diagnosis not present

## 2020-01-04 DIAGNOSIS — E119 Type 2 diabetes mellitus without complications: Secondary | ICD-10-CM | POA: Diagnosis not present

## 2020-01-04 DIAGNOSIS — E1122 Type 2 diabetes mellitus with diabetic chronic kidney disease: Secondary | ICD-10-CM | POA: Diagnosis not present

## 2020-01-04 DIAGNOSIS — I129 Hypertensive chronic kidney disease with stage 1 through stage 4 chronic kidney disease, or unspecified chronic kidney disease: Secondary | ICD-10-CM | POA: Diagnosis not present

## 2020-01-04 DIAGNOSIS — Z7982 Long term (current) use of aspirin: Secondary | ICD-10-CM | POA: Diagnosis not present

## 2020-01-04 DIAGNOSIS — M1990 Unspecified osteoarthritis, unspecified site: Secondary | ICD-10-CM | POA: Diagnosis not present

## 2020-01-04 DIAGNOSIS — H401390 Pigmentary glaucoma, unspecified eye, stage unspecified: Secondary | ICD-10-CM | POA: Diagnosis not present

## 2020-01-04 LAB — CBC
HCT: 31.9 % — ABNORMAL LOW (ref 36.0–46.0)
Hemoglobin: 10.8 g/dL — ABNORMAL LOW (ref 12.0–15.0)
MCH: 30.7 pg (ref 26.0–34.0)
MCHC: 33.9 g/dL (ref 30.0–36.0)
MCV: 90.6 fL (ref 80.0–100.0)
Platelets: 174 10*3/uL (ref 150–400)
RBC: 3.52 MIL/uL — ABNORMAL LOW (ref 3.87–5.11)
RDW: 13 % (ref 11.5–15.5)
WBC: 12.8 10*3/uL — ABNORMAL HIGH (ref 4.0–10.5)
nRBC: 0 % (ref 0.0–0.2)

## 2020-01-04 LAB — BASIC METABOLIC PANEL
Anion gap: 8 (ref 5–15)
BUN: 21 mg/dL (ref 8–23)
CO2: 24 mmol/L (ref 22–32)
Calcium: 8.5 mg/dL — ABNORMAL LOW (ref 8.9–10.3)
Chloride: 104 mmol/L (ref 98–111)
Creatinine, Ser: 0.92 mg/dL (ref 0.44–1.00)
GFR, Estimated: >60 mL/min (ref >60)
Glucose, Bld: 159 mg/dL — ABNORMAL HIGH (ref 70–99)
Potassium: 3.9 mmol/L (ref 3.5–5.1)
Sodium: 136 mmol/L (ref 135–145)

## 2020-01-04 MED ORDER — ASPIRIN 325 MG PO TBEC: 325.0000 mg | DELAYED_RELEASE_TABLET | Freq: Two times a day (BID) | ORAL | 0 refills | Status: AC

## 2020-01-04 MED ORDER — OXYCODONE HCL 5 MG PO TABS
5.0000 mg | ORAL_TABLET | Freq: Four times a day (QID) | ORAL | 0 refills | Status: DC | PRN
Start: 2020-01-04 — End: 2021-09-11

## 2020-01-04 MED ORDER — METHOCARBAMOL 500 MG PO TABS
500.0000 mg | ORAL_TABLET | Freq: Four times a day (QID) | ORAL | 0 refills | Status: DC | PRN
Start: 2020-01-04 — End: 2022-03-19

## 2020-01-04 NOTE — Progress Notes (Signed)
Subjective: 1 Day Post-Op Procedure(s) (LRB): TOTAL KNEE ARTHROPLASTY (Left) Patient reports pain as mild.   Patient seen in rounds by Dr. Wynelle Link. Patient is well, and has had no acute complaints or problems other than pain in the left knee. No issues overnight. Denies chest pain, SOB, or calf pain. Foley catheter removed this AM. We will continue therapy today, was limited yesterday due to pain.  Objective: Vital signs in last 24 hours: Temp:  [96.2 F (35.7 C)-98.2 F (36.8 C)] 98.2 F (36.8 C) (11/30 0527) Pulse Rate:  [51-71] 64 (11/30 0527) Resp:  [10-22] 18 (11/30 0527) BP: (98-139)/(37-112) 139/37 (11/30 0527) SpO2:  [87 %-100 %] 96 % (11/30 0527) Weight:  [78.5 kg] 78.5 kg (11/29 0825)  Intake/Output from previous day:  Intake/Output Summary (Last 24 hours) at 01/04/2020 0735 Last data filed at 01/04/2020 0609 Gross per 24 hour  Intake 1945.44 ml  Output 3275 ml  Net -1329.56 ml     Intake/Output this shift: No intake/output data recorded.  Labs: Recent Labs    01/04/20 0400  HGB 10.8*   Recent Labs    01/04/20 0400  WBC 12.8*  RBC 3.52*  HCT 31.9*  PLT 174   Recent Labs    01/04/20 0400  NA 136  K 3.9  CL 104  CO2 24  BUN 21  CREATININE 0.92  GLUCOSE 159*  CALCIUM 8.5*   No results for input(s): LABPT, INR in the last 72 hours.  Exam: General - Patient is Alert and Oriented Extremity - Neurologically intact Neurovascular intact Sensation intact distally Dorsiflexion/Plantar flexion intact Dressing - dressing C/D/I Motor Function - intact, moving foot and toes well on exam.   Past Medical History:  Diagnosis Date  . Anxiety   . Arthritis   . Chronic kidney disease   . Coronary artery disease    nonobstructive by cardiac catheterization 2011 with a 30% LAD lesion  . Depression   . Diabetes ([place])    Type 2  . GERD (gastroesophageal reflux disease)   . Glaucoma   . Hypercholesteremia   . Hypertension   . NASH (nonalcoholic  steatohepatitis)    NASH  . Pneumonia    30 years ago  . PVC (premature ventricular contraction)   . Seasonal allergies   . Sleep apnea   . Spinal stenosis     Assessment/Plan: 1 Day Post-Op Procedure(s) (LRB): TOTAL KNEE ARTHROPLASTY (Left) Principal Problem:   OA (osteoarthritis) of knee Active Problems:   Primary osteoarthritis of left knee  Estimated body mass index is 27.92 kg/m as calculated from the following:   Height as of this encounter: 5' 6"  (1.676 m).   Weight as of this encounter: 78.5 kg. Advance diet Up with therapy D/C IV fluids   Patient's anticipated LOS is less than 2 midnights, meeting these requirements: - Lives within 1 hour of care - Has a competent adult at home to recover with post-op recover - NO history of  - Chronic pain requiring opioids  - Diabetes  - Coronary Artery Disease  - Heart failure  - Heart attack  - Stroke  - DVT/VTE  - Cardiac arrhythmia  - Respiratory Failure/COPD  - Renal failure  - Anemia  - Advanced Liver disease  DVT Prophylaxis - Aspirin Weight bearing as tolerated. Continue therapy.  Plan is to go Home after hospital stay. Plan for discharge later today if progresses with therapy and is meeting her goals. Scheduled for OPP[facility] at [facility]. Follow-up in the office  December 14th.  The PDMP database was reviewed today prior to any opioid medications being prescribed to this patient.  Theresa Duty, PA-C Orthopedic Surgery [phone] 01/04/2020, 7:35 AM

## 2020-01-04 NOTE — Progress Notes (Signed)
Physical Therapy Treatment Patient Details Name: Tammy Pineda MRN: [id number] DOB: 21-Nov-1945 Today's Date: 01/04/2020    History of Present Illness Patient is 74 y.o. female s/p TKA on 01/03/20 with PMH significant for HTN, Glaucoma, GERD, OA, CKD, CAD, depression, anxiety, DM.    PT Comments    Pt assisted to bathroom and then ambulated in hallway.  Pt plans to d/c home later today.   Follow Up Recommendations  Follow surgeon's recommendation for DC plan and follow-up therapies     Equipment Recommendations  None recommended by PT    Recommendations for Other Services       Precautions / Restrictions Precautions Precautions: Fall;Knee Restrictions LLE Weight Bearing: Weight bearing as tolerated    Mobility  Bed Mobility Overal bed mobility: Needs Assistance Bed Mobility: Supine to Sit;Sit to Supine     Supine to sit: Min guard Sit to supine: Min guard   General bed mobility comments: cues for self assist  Transfers Overall transfer level: Needs assistance Equipment used: Rolling walker (2 wheeled) Transfers: Sit to/from Stand Sit to Stand: Min guard         General transfer comment: verbal cues for UE and LE positioning  Ambulation/Gait Ambulation/Gait assistance: Min guard;Min assist Gait Distance (Feet): 80 Feet Assistive device: Rolling walker (2 wheeled) Gait Pattern/deviations: Step-to pattern;Decreased stride length;Antalgic;Decreased stance time - left     General Gait Details: verbal cues for sequence, RW positioning, step length, posture; pt with slow pace; one instance of tipping walker toward right but pt able to self correct   Stairs             Wheelchair Mobility    Modified Rankin (Stroke Patients Only)       Balance                                            Cognition Arousal/Alertness: Awake/alert Behavior During Therapy: WFL for tasks assessed/performed Overall Cognitive Status: Within  Functional Limits for tasks assessed                                        Exercises      General Comments        Pertinent Vitals/Pain Pain Assessment: Faces Faces Pain Scale: Hurts little more Pain Location: Lt knee Pain Descriptors / Indicators: Aching;Discomfort;Sore Pain Intervention(s): Repositioned;Premedicated before session;Monitored during session    Home Living                      Prior Function            PT Goals (current goals can now be found in the care plan section) Progress towards PT goals: Progressing toward goals    Frequency    7X/week      PT Plan Current plan remains appropriate    Co-evaluation              AM-PAC PT "6 Clicks" Mobility   Outcome Measure  Help needed turning from your back to your side while in a flat bed without using bedrails?: None Help needed moving from lying on your back to sitting on the side of a flat bed without using bedrails?: A Little Help needed moving to and from a bed to a chair (including  a wheelchair)?: A Little Help needed standing up from a chair using your arms (e.g., wheelchair or bedside chair)?: A Little Help needed to walk in hospital room?: A Little Help needed climbing 3-5 steps with a railing? : A Little 6 Click Score: 19    End of Session Equipment Utilized During Treatment: Gait belt Activity Tolerance: Patient tolerated treatment well Patient left: in bed;with call bell/phone within reach;with bed alarm set Nurse Communication: Mobility status PT Visit Diagnosis: Muscle weakness (generalized) (M62.81);Difficulty in walking, not elsewhere classified (R26.2)     Time: 0404-5913 PT Time Calculation (min) (ACUTE ONLY): 22 min  Charges:  $Gait Training: 8-22 mins                     Arlyce Dice, DPT Acute Rehabilitation Services Pager: [phone] Office: [phone]  York Ram E 01/04/2020, 1:45 PM

## 2020-01-04 NOTE — Progress Notes (Signed)
Physical Therapy Treatment Patient Details Name: Tammy Pineda MRN: [id number] DOB: [birth year] Today's Date: 01/04/2020    History of Present Illness Patient is 75 y.o. female s/p TKA on 01/03/20 with PMH significant for HTN, Glaucoma, GERD, OA, CKD, CAD, depression, anxiety, DM.    PT Comments    Pt performed ankle pumps and knee flexion and extension in supine. Pt reviewed HEP handout and had no questions.  Pt assisted with ambulating in hallway. Spouse present for session.  Pt feels ready for d/c home today.    Follow Up Recommendations  Follow surgeon's recommendation for DC plan and follow-up therapies     Equipment Recommendations  None recommended by PT    Recommendations for Other Services       Precautions / Restrictions Precautions Precautions: Fall;Knee Restrictions LLE Weight Bearing: Weight bearing as tolerated    Mobility  Bed Mobility Overal bed mobility: Needs Assistance Bed Mobility: Supine to Sit     Supine to sit: Supervision Sit to supine: Min guard   General bed mobility comments: cues for self assist  Transfers Overall transfer level: Needs assistance Equipment used: Rolling walker (2 wheeled) Transfers: Sit to/from Stand Sit to Stand: Min guard         General transfer comment: verbal cues for UE and LE positioning  Ambulation/Gait Ambulation/Gait assistance: Min guard Gait Distance (Feet): 200 Feet Assistive device: Rolling walker (2 wheeled) Gait Pattern/deviations: Step-to pattern;Decreased stride length;Antalgic;Decreased stance time - left     General Gait Details: verbal cues for sequence, RW positioning, step length, allowing knee flexion, heel strike   Stairs             Wheelchair Mobility    Modified Rankin (Stroke Patients Only)       Balance                                            Cognition Arousal/Alertness: Awake/alert Behavior During Therapy: WFL for tasks  assessed/performed Overall Cognitive Status: Within Functional Limits for tasks assessed                                        Exercises Total Joint Exercises Ankle Circles/Pumps: AROM;Both;Seated;10 reps Quad Sets: AROM;Left;10 reps Heel Slides: AAROM;Left;10 reps    General Comments        Pertinent Vitals/Pain Pain Assessment: Faces Faces Pain Scale: Hurts little more Pain Location: Lt knee Pain Descriptors / Indicators: Aching;Discomfort;Sore Pain Intervention(s): Repositioned;Monitored during session;Premedicated before session    Home Living                      Prior Function            PT Goals (current goals can now be found in the care plan section) Progress towards PT goals: Progressing toward goals    Frequency    7X/week      PT Plan Current plan remains appropriate    Co-evaluation              AM-PAC PT "6 Clicks" Mobility   Outcome Measure  Help needed turning from your back to your side while in a flat bed without using bedrails?: None Help needed moving from lying on your back to sitting on the side of a flat  bed without using bedrails?: A Little Help needed moving to and from a bed to a chair (including a wheelchair)?: A Little Help needed standing up from a chair using your arms (e.g., wheelchair or bedside chair)?: A Little Help needed to walk in hospital room?: A Little Help needed climbing 3-5 steps with a railing? : A Little 6 Click Score: 19    End of Session Equipment Utilized During Treatment: Gait belt Activity Tolerance: Patient tolerated treatment well Patient left: with call bell/phone within reach;in bed (sitting EOB with spouse to get dressed) Nurse Communication: Mobility status PT Visit Diagnosis: Muscle weakness (generalized) (M62.81);Difficulty in walking, not elsewhere classified (R26.2)     Time: 0388-8280 PT Time Calculation (min) (ACUTE ONLY): 28 min  Charges:  $Gait Training:  8-22 mins $Therapeutic Exercise: 8-22 mins                     Jannette Spanner PT, DPT Acute Rehabilitation Services Pager: [phone] Office: [phone]  York Ram E 01/04/2020, 2:46 PM

## 2020-01-04 NOTE — Plan of Care (Signed)
  Problem: Education: Goal: Knowledge of General Education information will improve Description Including pain rating scale, medication(s)/side effects and non-pharmacologic comfort measures Outcome: Progressing   Problem: Health Behavior/Discharge Planning: Goal: Ability to manage health-related needs will improve Outcome: Progressing   

## 2020-01-04 NOTE — Plan of Care (Signed)
Problem: Education: Goal: Knowledge of General Education information will improve Description: Including pain rating scale, medication(s)/side effects and non-pharmacologic comfort measures 01/04/2020 1015 by Eimy Plaza, Helane Gunther, RN Outcome: Adequate for Discharge 01/04/2020 1015 by Adael Culbreath, Helane Gunther, RN Outcome: Progressing   Problem: Health Behavior/Discharge Planning: Goal: Ability to manage health-related needs will improve 01/04/2020 1015 by Oza Oberle, Helane Gunther, RN Outcome: Adequate for Discharge 01/04/2020 1015 by Perri Aragones, Helane Gunther, RN Outcome: Progressing   Problem: Clinical Measurements: Goal: Ability to maintain clinical measurements within normal limits will improve 01/04/2020 1015 by Tagen Milby, Helane Gunther, RN Outcome: Adequate for Discharge 01/04/2020 1015 by Deetta Perla, RN Outcome: Progressing Goal: Will remain free from infection 01/04/2020 1015 by Deetta Perla, RN Outcome: Adequate for Discharge 01/04/2020 1015 by Deetta Perla, RN Outcome: Progressing Goal: Diagnostic test results will improve 01/04/2020 1015 by Deetta Perla, RN Outcome: Adequate for Discharge 01/04/2020 1015 by Deetta Perla, RN Outcome: Progressing Goal: Respiratory complications will improve 01/04/2020 1015 by Deetta Perla, RN Outcome: Adequate for Discharge 01/04/2020 1015 by Deetta Perla, RN Outcome: Progressing Goal: Cardiovascular complication will be avoided 01/04/2020 1015 by Deetta Perla, RN Outcome: Adequate for Discharge 01/04/2020 1015 by Deetta Perla, RN Outcome: Progressing   Problem: Activity: Goal: Risk for activity intolerance will decrease 01/04/2020 1015 by Amberleigh Gerken, Helane Gunther, RN Outcome: Adequate for Discharge 01/04/2020 1015 by Brooks Stotz, Helane Gunther, RN Outcome: Progressing   Problem: Nutrition: Goal: Adequate nutrition will be maintained 01/04/2020 1015 by Narissa Beaufort, Helane Gunther, RN Outcome: Adequate for Discharge 01/04/2020 1015  by Deetta Perla, RN Outcome: Progressing   Problem: Coping: Goal: Level of anxiety will decrease 01/04/2020 1015 by Jasmynn Pfalzgraf, Helane Gunther, RN Outcome: Adequate for Discharge 01/04/2020 1015 by Deetta Perla, RN Outcome: Progressing   Problem: Elimination: Goal: Will not experience complications related to bowel motility 01/04/2020 1015 by Deetta Perla, RN Outcome: Adequate for Discharge 01/04/2020 1015 by Deetta Perla, RN Outcome: Progressing Goal: Will not experience complications related to urinary retention 01/04/2020 1015 by Ndeye Tenorio, Helane Gunther, RN Outcome: Adequate for Discharge 01/04/2020 1015 by Janaia Kozel, Helane Gunther, RN Outcome: Progressing   Problem: Pain Managment: Goal: General experience of comfort will improve 01/04/2020 1015 by Damaree Sargent, Helane Gunther, RN Outcome: Adequate for Discharge 01/04/2020 1015 by Deetta Perla, RN Outcome: Progressing   Problem: Skin Integrity: Goal: Risk for impaired skin integrity will decrease 01/04/2020 1015 by Deetta Perla, RN Outcome: Adequate for Discharge 01/04/2020 1015 by Deetta Perla, RN Outcome: Progressing   Problem: Education: Goal: Knowledge of the prescribed therapeutic regimen will improve 01/04/2020 1015 by Deetta Perla, RN Outcome: Adequate for Discharge 01/04/2020 1015 by Deetta Perla, RN Outcome: Progressing Goal: Individualized Educational Video(s) 01/04/2020 1015 by Deetta Perla, RN Outcome: Adequate for Discharge 01/04/2020 1015 by Deetta Perla, RN Outcome: Progressing   Problem: Activity: Goal: Ability to avoid complications of mobility impairment will improve 01/04/2020 1015 by Jensyn Cambria, Helane Gunther, RN Outcome: Adequate for Discharge 01/04/2020 1015 by Velia Pamer, Helane Gunther, RN Outcome: Progressing Goal: Range of joint motion will improve 01/04/2020 1015 by Amilya Haver, Helane Gunther, RN Outcome: Adequate for Discharge 01/04/2020 1015 by Stevon Gough, Helane Gunther, RN Outcome:  Progressing   Problem: Clinical Measurements: Goal: Postoperative complications will be avoided or minimized 01/04/2020 1015 by Langston Tuberville, Helane Gunther, RN Outcome: Adequate for Discharge 01/04/2020 1015 by Deetta Perla, RN Outcome: Progressing   Problem: Pain Management: Goal: Pain level will decrease with appropriate interventions 01/04/2020 1015  by Deetta Perla, RN Outcome: Adequate for Discharge 01/04/2020 1015 by Tita Terhaar, Helane Gunther, RN Outcome: Progressing

## 2020-01-05 NOTE — Discharge Summary (Signed)
Physician Discharge Summary   Patient ID: GABRIANNA FASSNACHT MRN: [id number] DOB/AGE: 1945/02/22 74 y.o.  Admit date: 01/03/2020 Discharge date: 01/04/2020  Primary Diagnosis: Osteoarthritis, left knee   Admission Diagnoses:  Past Medical History:  Diagnosis Date  . Anxiety   . Arthritis   . Chronic kidney disease   . Coronary artery disease    nonobstructive by cardiac catheterization 2011 with a 30% LAD lesion  . Depression   . Diabetes ([place])    Type 2  . GERD (gastroesophageal reflux disease)   . Glaucoma   . Hypercholesteremia   . Hypertension   . NASH (nonalcoholic steatohepatitis)    NASH  . Pneumonia    30 years ago  . PVC (premature ventricular contraction)   . Seasonal allergies   . Sleep apnea   . Spinal stenosis    Discharge Diagnoses:   Principal Problem:   OA (osteoarthritis) of knee Active Problems:   Primary osteoarthritis of left knee  Estimated body mass index is 27.92 kg/m as calculated from the following:   Height as of this encounter: 5' 6"  (1.676 m).   Weight as of this encounter: 78.5 kg.  Procedure:  Procedure(s) (LRB): TOTAL KNEE ARTHROPLASTY (Left)   Consults: None  HPI: ZIARE ORRICK is a 74 y.o. year old female with end stage OA of her left knee with progressively worsening pain and dysfunction. She has constant pain, with activity and at rest and significant functional deficits with difficulties even with ADLs. She has had extensive non-op management including analgesics, injections of cortisone and viscosupplements, and home exercise program, but remains in significant pain with significant dysfunction. Radiographs show bone on bone arthritis medial and patellofemoral. She presents now for left Total Knee Arthroplasty.    Laboratory Data: Admission on 01/03/2020, Discharged on 01/04/2020  Component Date Value Ref Range Status  . ABO/RH(D) 01/03/2020    Final                   Value:A POS Performed at [facility],  [place] [address], [place], [place] [address]   . Glucose-Capillary 01/03/2020 142* 70 - 99 mg/dL Final   Glucose reference range applies only to samples taken after fasting for at least 8 hours.  . Comment 1 01/03/2020 Notify RN   Final  . Comment 2 01/03/2020 Document in Chart   Final  . Glucose-Capillary 01/03/2020 151* 70 - 99 mg/dL Final   Glucose reference range applies only to samples taken after fasting for at least 8 hours.  . WBC 01/04/2020 12.8* 4.0 - 10.5 K/uL Final  . RBC 01/04/2020 3.52* 3.87 - 5.11 MIL/uL Final  . Hemoglobin 01/04/2020 10.8* 12.0 - 15.0 g/dL Final  . HCT 01/04/2020 31.9* 36 - 46 % Final  . MCV 01/04/2020 90.6  80.0 - 100.0 fL Final  . MCH 01/04/2020 30.7  26.0 - 34.0 pg Final  . MCHC 01/04/2020 33.9  30.0 - 36.0 g/dL Final  . RDW 01/04/2020 13.0  11.5 - 15.5 % Final  . Platelets 01/04/2020 174  150 - 400 K/uL Final  . nRBC 01/04/2020 0.0  0.0 - 0.2 % Final   Performed at [facility], [place] [address] [place], [place] [address]  . Sodium 01/04/2020 136  135 - 145 mmol/L Final  . Potassium 01/04/2020 3.9  3.5 - 5.1 mmol/L Final  . Chloride 01/04/2020 104  98 - 111 mmol/L Final  . CO2 01/04/2020 24  22 - 32 mmol/L Final  .  Glucose, Bld 01/04/2020 159* 70 - 99 mg/dL Final   Glucose reference range applies only to samples taken after fasting for at least 8 hours.  . BUN 01/04/2020 21  8 - 23 mg/dL Final  . Creatinine, Ser 01/04/2020 0.92  0.44 - 1.00 mg/dL Final  . Calcium 01/04/2020 8.5* 8.9 - 10.3 mg/dL Final  . GFR, Estimated 01/04/2020 >60  >60 mL/min Final   Comment: (NOTE) Calculated using the CKD-EPI Creatinine Equation (2021)   . Anion gap 01/04/2020 8  5 - 15 Final   Performed at [facility], [place] [address], [place], [place] [address]  Hospital Outpatient Visit on 12/31/2019  Component Date Value Ref Range Status  . SARS Coronavirus 2 12/31/2019 NEGATIVE  NEGATIVE Final   Comment: (NOTE) SARS-CoV-2 target  nucleic acids are NOT DETECTED.  The SARS-CoV-2 RNA is generally detectable in upper and lower respiratory specimens during the acute phase of infection. Negative results do not preclude SARS-CoV-2 infection, do not rule out co-infections with other pathogens, and should not be used as the sole basis for treatment or other patient management decisions. Negative results must be combined with clinical observations, patient history, and epidemiological information. The expected result is Negative.  Fact Sheet for Patients: SugarRoll.be  Fact Sheet for Healthcare Providers: https://www.woods-mathews.com/  This test is not yet approved or cleared by the [place] FDA and  has been authorized for detection and/or diagnosis of SARS-CoV-2 by FDA under an Emergency Use Authorization (EUA). This EUA will remain  in effect (meaning this test can be used) for the duration of the COVID-19 declaration under Se                          ction 564(b)(1) of the Act, 21 U.S.C. section 360bbb-3(b)(1), unless the authorization is terminated or revoked sooner.  Performed at [facility], [place] [address], [place], [place] [address]   Hospital Outpatient Visit on 12/22/2019  Component Date Value Ref Range Status  . MRSA, PCR 12/22/2019 NEGATIVE  NEGATIVE Final  . Staphylococcus aureus 12/22/2019 NEGATIVE  NEGATIVE Final   Comment: (NOTE) The Xpert SA Assay (FDA approved for NASAL specimens in patients 53 years of age and older), is one component of a comprehensive surveillance program. It is not intended to diagnose infection nor to guide or monitor treatment. Performed at [facility], [place] [address], [place], [place] [address]   . aPTT 12/22/2019 29  24 - 36 seconds Final   Performed at [facility], [place] [address], [place], [place] [address]  . WBC 12/22/2019 8.5  4.0 - 10.5 K/uL Final  . RBC 12/22/2019 4.27   3.87 - 5.11 MIL/uL Final  . Hemoglobin 12/22/2019 12.9  12.0 - 15.0 g/dL Final  . HCT 12/22/2019 38.8  36 - 46 % Final  . MCV 12/22/2019 90.9  80.0 - 100.0 fL Final  . MCH 12/22/2019 30.2  26.0 - 34.0 pg Final  . MCHC 12/22/2019 33.2  30.0 - 36.0 g/dL Final  . RDW 12/22/2019 12.8  11.5 - 15.5 % Final  . Platelets 12/22/2019 230  150 - 400 K/uL Final  . nRBC 12/22/2019 0.0  0.0 - 0.2 % Final   Performed at [facility], [place] [address] [place], [place] [address]  . Sodium 12/22/2019 138  135 - 145 mmol/L Final  . Potassium 12/22/2019 3.4* 3.5 - 5.1 mmol/L Final  . Chloride 12/22/2019 101  98 - 111 mmol/L Final  . CO2 12/22/2019 29  22 - 32 mmol/L Final  . Glucose, Bld 12/22/2019 97  70 - 99 mg/dL Final   Glucose reference range applies only to samples taken after fasting for at least 8 hours.  . BUN 12/22/2019 25* 8 - 23 mg/dL Final  . Creatinine, Ser 12/22/2019 0.85  0.44 - 1.00 mg/dL Final  . Calcium 12/22/2019 9.6  8.9 - 10.3 mg/dL Final  . Total Protein 12/22/2019 7.8  6.5 - 8.1 g/dL Final  . Albumin 12/22/2019 4.4  3.5 - 5.0 g/dL Final  . AST 12/22/2019 37  15 - 41 U/L Final  . ALT 12/22/2019 57* 0 - 44 U/L Final  . Alkaline Phosphatase 12/22/2019 201* 38 - 126 U/L Final  . Total Bilirubin 12/22/2019 0.6  0.3 - 1.2 mg/dL Final  . GFR, Estimated 12/22/2019 >60  >60 mL/min Final   Comment: (NOTE) Calculated using the CKD-EPI Creatinine Equation (2021)   . Anion gap 12/22/2019 8  5 - 15 Final   Performed at [facility], [place] [address] [place], [place] [address]  . Prothrombin Time 12/22/2019 12.2  11.4 - 15.2 seconds Final  . INR 12/22/2019 0.9  0.8 - 1.2 Final   Comment: (NOTE) INR goal varies based on device and disease states. Performed at [facility], [place] [address], [place], [place] [address]   . ABO/RH(D) 12/22/2019 A POS   Final  . Antibody Screen 12/22/2019 NEG   Final  . Sample Expiration 12/22/2019  01/05/2020,2359   Final  . Extend sample reason 12/22/2019    Final                   Value:NO TRANSFUSIONS OR PREGNANCY IN THE PAST 3 MONTHS Performed at [place] [address] [place], [place] [address]   . Glucose-Capillary 12/22/2019 112* 70 - 99 mg/dL Final   Glucose reference range applies only to samples taken after fasting for at least 8 hours.     X-Rays:No results found.  EKG: Orders placed or performed in visit on 12/10/19  . EKG 12-Lead     Hospital Course: CHAROLETTE BULTMAN is a 74 y.o. who was admitted to [facility]. They were brought to the operating room on 01/03/2020 and underwent Procedure(s): TOTAL KNEE ARTHROPLASTY.  Patient tolerated the procedure well and was later transferred to the recovery room and then to the orthopaedic floor for postoperative care. They were given PO and IV analgesics for pain control following their surgery. They were given 24 hours of postoperative antibiotics of  Anti-infectives (From admission, onward)   Start     Dose/Rate Route Frequency Ordered Stop   01/03/20 1600  ceFAZolin (ANCEF) IVPB 2g/100 mL premix        2 g 200 mL/hr over 30 Minutes Intravenous Every 6 hours 01/03/20 1422 01/03/20 2301   01/03/20 0815  ceFAZolin (ANCEF) IVPB 2g/100 mL premix        2 g 200 mL/hr over 30 Minutes Intravenous On call to O.R. 01/03/20 3154 01/03/20 0933     and started on DVT prophylaxis in the form of Aspirin.   PT and OT were ordered for total joint protocol. Discharge planning consulted to help with postop disposition and equipment needs.  Patient had a good night on the evening of surgery. They started to get up OOB with therapy on POD #0. Pt was seen during rounds and was ready to  go home pending progress with therapy. She worked with therapy on POD #1 and was meeting her goals. Pt was discharged to home later that day in stable condition.  Diet: Regular diet Activity: WBAT Follow-up: in 2  weeks Disposition: Home with outpatient physical therapy Discharged Condition: stable   Discharge Instructions    Call MD / Call 911   Complete by: As directed    If you experience chest pain or shortness of breath, CALL 911 and be transported to the hospital emergency room.  If you develope a fever above 101 F, pus (white drainage) or increased drainage or redness at the wound, or calf pain, call your surgeon's office.   Change dressing   Complete by: As directed    You may remove the bulky bandage (ACE wrap and gauze) two days after surgery. You will have an adhesive waterproof bandage underneath. Leave this in place until your first follow-up appointment.   Constipation Prevention   Complete by: As directed    Drink plenty of fluids.  Prune juice may be helpful.  You may use a stool softener, such as Colace (over the counter) 100 mg twice a day.  Use MiraLax (over the counter) for constipation as needed.   Diet - low sodium heart healthy   Complete by: As directed    Do not put a pillow under the knee. Place it under the heel.   Complete by: As directed    Driving restrictions   Complete by: As directed    No driving for two weeks   TED hose   Complete by: As directed    Use stockings (TED hose) for three weeks on both leg(s).  You may remove them at night for sleeping.   Weight bearing as tolerated   Complete by: As directed      Allergies as of 01/04/2020      Reactions   Metformin And Related Diarrhea   Gabapentin Palpitations   Sulfamethoxazole-trimethoprim Rash   Tape Rash      Medication List    STOP taking these medications   HYDROcodone-acetaminophen 5-325 MG tablet Commonly known as: NORCO/VICODIN   ibuprofen 200 MG tablet Commonly known as: ADVIL     TAKE these medications   ALPRAZolam 0.5 MG tablet Commonly known as: XANAX Take 0.25 mg by mouth at bedtime as needed for anxiety.   aspirin 325 MG EC tablet Take 1 tablet (325 mg total) by mouth 2 (two)  times daily for 20 days. Then take one 81 mg aspirin once a day for three weeks. Then discontinue aspirin.   brimonidine 0.2 % ophthalmic solution Commonly known as: ALPHAGAN Place 1 drop into both eyes 2 (two) times daily.   Cranberry Plus Vitamin C 4200-20-3 MG-MG-UNIT Caps Generic drug: Cranberry-Vitamin C-Vitamin E Take by mouth.   escitalopram 10 MG tablet Commonly known as: LEXAPRO Take 10 mg by mouth at bedtime.   glipiZIDE 2.5 MG 24 hr tablet Commonly known as: GLUCOTROL XL Take 1 tablet (2.5 mg total) by mouth daily before breakfast.   hydrochlorothiazide 12.5 MG capsule Commonly known as: MICROZIDE Take 12.5 mg by mouth every evening.   inFLIXimab 100 MG injection Commonly known as: REMICADE Inject 100 mg into the vein every 6 (six) weeks.   latanoprost 0.005 % ophthalmic solution Commonly known as: XALATAN Place 1 drop into both eyes at bedtime.   losartan 50 MG tablet Commonly known as: COZAAR Take 50 mg by mouth at bedtime.   meclizine 12.5 MG  tablet Commonly known as: ANTIVERT Take 1 tablet (12.5 mg total) by mouth 3 (three) times daily as needed for dizziness.   methocarbamol 500 MG tablet Commonly known as: ROBAXIN Take 1 tablet (500 mg total) by mouth every 6 (six) hours as needed for muscle spasms.   omeprazole 20 MG capsule Commonly known as: PRILOSEC Take 20 mg by mouth daily as needed (acid reflux/indigestion.). Pt takes as needed.   oxyCODONE 5 MG immediate release tablet Commonly known as: Oxy IR/ROXICODONE Take 1-2 tablets (5-10 mg total) by mouth every 6 (six) hours as needed for moderate pain or severe pain.   rosuvastatin 5 MG tablet Commonly known as: CRESTOR Take 5 mg by mouth at bedtime.   timolol 0.5 % ophthalmic solution Commonly known as: TIMOPTIC Place 1 drop into both eyes daily.   Turmeric Curcumin Caps Take 1 capsule by mouth daily. 1500 mg/capsule   vitamin B-12 1000 MCG tablet Commonly known as: CYANOCOBALAMIN Take  1,000 mcg by mouth daily.   Vitamin D 50 MCG (2000 UT) Caps Take 2,000 Units by mouth daily.            Discharge Care Instructions  (From admission, onward)         Start     Ordered   01/04/20 0000  Weight bearing as tolerated        01/04/20 0738   01/04/20 0000  Change dressing       Comments: You may remove the bulky bandage (ACE wrap and gauze) two days after surgery. You will have an adhesive waterproof bandage underneath. Leave this in place until your first follow-up appointment.   01/04/20 8110          Follow-up Information    Gaynelle Arabian, MD. Go on 01/18/2020.   Specialty: Orthopedic Surgery Why: You are scheduled for a post-operative appointment on 01-18-20 at 2:45 pm.  Contact information: [address] [place] [address] [phone]        Rosilyn Mings.. Go on 01/06/2020.   Why: You are scheduled for a physical therapy appointment on 01-06-20 at 11:15 am.  Contact information: [place] [address] [phone]               Signed: Theresa Duty, PA-C Orthopedic Surgery 01/05/2020, 10:48 AM

## 2020-01-06 DIAGNOSIS — M25662 Stiffness of left knee, not elsewhere classified: Secondary | ICD-10-CM | POA: Diagnosis not present

## 2020-01-06 DIAGNOSIS — M25562 Pain in left knee: Secondary | ICD-10-CM | POA: Diagnosis not present

## 2020-01-07 ENCOUNTER — Encounter (HOSPITAL_COMMUNITY): Payer: Self-pay | Admitting: Orthopedic Surgery

## 2020-01-11 ENCOUNTER — Other Ambulatory Visit: Payer: Self-pay

## 2020-01-11 ENCOUNTER — Ambulatory Visit (HOSPITAL_COMMUNITY)
Admission: RE | Admit: 2020-01-11 | Discharge: 2020-01-11 | Disposition: A | Discharge status: Home / Self Care | Payer: Medicare Other | Source: Ambulatory Visit | Attending: Cardiology | Admitting: Cardiology

## 2020-01-11 ENCOUNTER — Other Ambulatory Visit (HOSPITAL_COMMUNITY): Payer: Self-pay | Admitting: Specialist

## 2020-01-11 DIAGNOSIS — M79662 Pain in left lower leg: Secondary | ICD-10-CM | POA: Diagnosis not present

## 2020-01-11 DIAGNOSIS — M7989 Other specified soft tissue disorders: Secondary | ICD-10-CM | POA: Diagnosis not present

## 2020-01-19 DIAGNOSIS — M25662 Stiffness of left knee, not elsewhere classified: Secondary | ICD-10-CM | POA: Diagnosis not present

## 2020-01-19 DIAGNOSIS — M25562 Pain in left knee: Secondary | ICD-10-CM | POA: Diagnosis not present

## 2020-01-21 DIAGNOSIS — M25662 Stiffness of left knee, not elsewhere classified: Secondary | ICD-10-CM | POA: Diagnosis not present

## 2020-01-21 DIAGNOSIS — M25562 Pain in left knee: Secondary | ICD-10-CM | POA: Diagnosis not present

## 2020-01-25 DIAGNOSIS — M25562 Pain in left knee: Secondary | ICD-10-CM | POA: Diagnosis not present

## 2020-01-25 DIAGNOSIS — M0609 Rheumatoid arthritis without rheumatoid factor, multiple sites: Secondary | ICD-10-CM | POA: Diagnosis not present

## 2020-01-25 DIAGNOSIS — M25662 Stiffness of left knee, not elsewhere classified: Secondary | ICD-10-CM | POA: Diagnosis not present

## 2020-01-27 DIAGNOSIS — M25662 Stiffness of left knee, not elsewhere classified: Secondary | ICD-10-CM | POA: Diagnosis not present

## 2020-01-27 DIAGNOSIS — M25562 Pain in left knee: Secondary | ICD-10-CM | POA: Diagnosis not present

## 2020-02-01 DIAGNOSIS — M25562 Pain in left knee: Secondary | ICD-10-CM | POA: Diagnosis not present

## 2020-02-01 DIAGNOSIS — M25662 Stiffness of left knee, not elsewhere classified: Secondary | ICD-10-CM | POA: Diagnosis not present

## 2020-02-03 DIAGNOSIS — M25562 Pain in left knee: Secondary | ICD-10-CM | POA: Diagnosis not present

## 2020-02-03 DIAGNOSIS — M25662 Stiffness of left knee, not elsewhere classified: Secondary | ICD-10-CM | POA: Diagnosis not present

## 2020-02-05 HISTORY — PX: CARDIOVASCULAR STRESS TEST: SHX262

## 2020-02-08 DIAGNOSIS — Z96652 Presence of left artificial knee joint: Secondary | ICD-10-CM | POA: Diagnosis not present

## 2020-02-10 DIAGNOSIS — M25562 Pain in left knee: Secondary | ICD-10-CM | POA: Diagnosis not present

## 2020-02-17 DIAGNOSIS — M25662 Stiffness of left knee, not elsewhere classified: Secondary | ICD-10-CM | POA: Diagnosis not present

## 2020-02-17 DIAGNOSIS — M25562 Pain in left knee: Secondary | ICD-10-CM | POA: Diagnosis not present

## 2020-02-18 DIAGNOSIS — S96812A Strain of other specified muscles and tendons at ankle and foot level, left foot, initial encounter: Secondary | ICD-10-CM | POA: Diagnosis not present

## 2020-02-18 DIAGNOSIS — M79672 Pain in left foot: Secondary | ICD-10-CM | POA: Diagnosis not present

## 2020-02-18 DIAGNOSIS — M19079 Primary osteoarthritis, unspecified ankle and foot: Secondary | ICD-10-CM | POA: Diagnosis not present

## 2020-02-22 DIAGNOSIS — M25562 Pain in left knee: Secondary | ICD-10-CM | POA: Diagnosis not present

## 2020-02-22 DIAGNOSIS — M25662 Stiffness of left knee, not elsewhere classified: Secondary | ICD-10-CM | POA: Diagnosis not present

## 2020-02-24 DIAGNOSIS — M25662 Stiffness of left knee, not elsewhere classified: Secondary | ICD-10-CM | POA: Diagnosis not present

## 2020-02-29 DIAGNOSIS — M25662 Stiffness of left knee, not elsewhere classified: Secondary | ICD-10-CM | POA: Diagnosis not present

## 2020-03-02 ENCOUNTER — Other Ambulatory Visit (HOSPITAL_COMMUNITY): Payer: Self-pay | Admitting: Orthopedic Surgery

## 2020-03-02 ENCOUNTER — Ambulatory Visit (HOSPITAL_COMMUNITY)
Admission: RE | Admit: 2020-03-02 | Discharge: 2020-03-02 | Disposition: A | Discharge status: Home / Self Care | Payer: Medicare Other | Source: Ambulatory Visit | Attending: Orthopedic Surgery | Admitting: Orthopedic Surgery

## 2020-03-02 ENCOUNTER — Other Ambulatory Visit: Payer: Self-pay

## 2020-03-02 DIAGNOSIS — M79661 Pain in right lower leg: Secondary | ICD-10-CM | POA: Diagnosis not present

## 2020-03-02 DIAGNOSIS — M25662 Stiffness of left knee, not elsewhere classified: Secondary | ICD-10-CM | POA: Diagnosis not present

## 2020-03-02 DIAGNOSIS — M7989 Other specified soft tissue disorders: Secondary | ICD-10-CM

## 2020-03-02 DIAGNOSIS — M79662 Pain in left lower leg: Secondary | ICD-10-CM | POA: Insufficient documentation

## 2020-03-09 DIAGNOSIS — M0609 Rheumatoid arthritis without rheumatoid factor, multiple sites: Secondary | ICD-10-CM | POA: Diagnosis not present

## 2020-03-16 DIAGNOSIS — K219 Gastro-esophageal reflux disease without esophagitis: Secondary | ICD-10-CM | POA: Diagnosis not present

## 2020-03-16 DIAGNOSIS — H401390 Pigmentary glaucoma, unspecified eye, stage unspecified: Secondary | ICD-10-CM | POA: Diagnosis not present

## 2020-03-16 DIAGNOSIS — N183 Chronic kidney disease, stage 3 unspecified: Secondary | ICD-10-CM | POA: Diagnosis not present

## 2020-03-16 DIAGNOSIS — M1990 Unspecified osteoarthritis, unspecified site: Secondary | ICD-10-CM | POA: Diagnosis not present

## 2020-03-16 DIAGNOSIS — E7800 Pure hypercholesterolemia, unspecified: Secondary | ICD-10-CM | POA: Diagnosis not present

## 2020-03-16 DIAGNOSIS — I2510 Atherosclerotic heart disease of native coronary artery without angina pectoris: Secondary | ICD-10-CM | POA: Diagnosis not present

## 2020-03-16 DIAGNOSIS — I10 Essential (primary) hypertension: Secondary | ICD-10-CM | POA: Diagnosis not present

## 2020-03-16 DIAGNOSIS — E1122 Type 2 diabetes mellitus with diabetic chronic kidney disease: Secondary | ICD-10-CM | POA: Diagnosis not present

## 2020-03-24 DIAGNOSIS — E7800 Pure hypercholesterolemia, unspecified: Secondary | ICD-10-CM | POA: Diagnosis not present

## 2020-03-24 DIAGNOSIS — I10 Essential (primary) hypertension: Secondary | ICD-10-CM | POA: Diagnosis not present

## 2020-03-24 DIAGNOSIS — R799 Abnormal finding of blood chemistry, unspecified: Secondary | ICD-10-CM | POA: Diagnosis not present

## 2020-03-24 DIAGNOSIS — N183 Chronic kidney disease, stage 3 unspecified: Secondary | ICD-10-CM | POA: Diagnosis not present

## 2020-03-24 DIAGNOSIS — E1122 Type 2 diabetes mellitus with diabetic chronic kidney disease: Secondary | ICD-10-CM | POA: Diagnosis not present

## 2020-03-24 DIAGNOSIS — M1990 Unspecified osteoarthritis, unspecified site: Secondary | ICD-10-CM | POA: Diagnosis not present

## 2020-03-24 DIAGNOSIS — R300 Dysuria: Secondary | ICD-10-CM | POA: Diagnosis not present

## 2020-03-24 DIAGNOSIS — H401390 Pigmentary glaucoma, unspecified eye, stage unspecified: Secondary | ICD-10-CM | POA: Diagnosis not present

## 2020-03-24 DIAGNOSIS — M5416 Radiculopathy, lumbar region: Secondary | ICD-10-CM | POA: Diagnosis not present

## 2020-03-24 DIAGNOSIS — R0989 Other specified symptoms and signs involving the circulatory and respiratory systems: Secondary | ICD-10-CM | POA: Diagnosis not present

## 2020-03-24 DIAGNOSIS — K219 Gastro-esophageal reflux disease without esophagitis: Secondary | ICD-10-CM | POA: Diagnosis not present

## 2020-03-24 DIAGNOSIS — I2510 Atherosclerotic heart disease of native coronary artery without angina pectoris: Secondary | ICD-10-CM | POA: Diagnosis not present

## 2020-03-27 ENCOUNTER — Other Ambulatory Visit: Payer: Self-pay | Admitting: *Deleted

## 2020-03-27 ENCOUNTER — Telehealth: Payer: Self-pay | Admitting: Internal Medicine

## 2020-03-27 MED ORDER — GLIPIZIDE ER 2.5 MG PO TB24
2.5000 mg | ORAL_TABLET | Freq: Every day | ORAL | 3 refills | Status: DC
Start: 2020-03-27 — End: 2020-11-30

## 2020-03-27 NOTE — Telephone Encounter (Signed)
Patient requesting that her Glipizide be called into the [facility] - was at [facility] and she is switching back and [facility] will not transfer her RX back  Call back for patient # [phone]

## 2020-03-27 NOTE — Telephone Encounter (Signed)
Rx was sent to [facility]

## 2020-04-11 ENCOUNTER — Other Ambulatory Visit: Payer: Self-pay | Admitting: Family Medicine

## 2020-04-11 DIAGNOSIS — Z1231 Encounter for screening mammogram for malignant neoplasm of breast: Secondary | ICD-10-CM

## 2020-04-18 ENCOUNTER — Ambulatory Visit (INDEPENDENT_AMBULATORY_CARE_PROVIDER_SITE_OTHER): Payer: Medicare Other | Admitting: Internal Medicine

## 2020-04-18 ENCOUNTER — Other Ambulatory Visit: Payer: Self-pay

## 2020-04-18 ENCOUNTER — Encounter: Payer: Self-pay | Admitting: Internal Medicine

## 2020-04-18 VITALS — BP 138/72 | HR 93 | Ht 66.0 in | Wt 172.6 lb

## 2020-04-18 DIAGNOSIS — E663 Overweight: Secondary | ICD-10-CM

## 2020-04-18 DIAGNOSIS — E1165 Type 2 diabetes mellitus with hyperglycemia: Secondary | ICD-10-CM

## 2020-04-18 DIAGNOSIS — E785 Hyperlipidemia, unspecified: Secondary | ICD-10-CM

## 2020-04-18 LAB — POCT GLYCOSYLATED HEMOGLOBIN (HGB A1C): Hemoglobin A1C: 6.1 % — AB (ref 4.0–5.6)

## 2020-04-18 NOTE — Patient Instructions (Signed)
Please continue: - Glipizide XL 2.5 mg daily before b'fast  Check sugars once a day, rotating check times.  Please return in 4 months.

## 2020-04-18 NOTE — Progress Notes (Signed)
Patient ID: Tammy Pineda, female   DOB: Apr 11, 1945, 75 y.o.   MRN: [id number]   This visit occurred during the SARS-CoV-2 public health emergency.  Safety protocols were in place, including screening questions prior to the visit, additional usage of staff PPE, and extensive cleaning of exam room while observing appropriate contact time as indicated for disinfecting solutions.    HPI: Tammy Pineda is a 75 y.o.-year-old female, initially referred by her PCP, Dr. Drema Pineda, returning for follow-up for DM2, dx "several years ago", non-insulin-dependent, uncontrolled, with complications (CAD, CKD stage III, NASH). Tammy Pineda, her husband, is also my pt. Last visit 4 mo ago.  Interim history: She had left TKR 01/03/2020.  She had A LOT of pain right after the surgery >> still some pain now. She also had a DVT in the L leg - on Xarelto x 3 mo >> now ASA 81. Also on Amitriptyline for back and leg pain. She was also on Prednisone for several days. She will also need right TKR, but she is not eager to go ahead with this. She continues to have a lot of stress at home with husband having multiple medical problems.  She also takes care of her sister who has cognitive impairment.  Reviewed HbA1c levels: 11/25/2019: HbA1c 6.5% Lab Results  Component Value Date   HGBA1C 6.7 (A) 08/11/2019   HGBA1C 6.9 (A) 05/03/2019   HGBA1C 6.4 (A) 02/08/2019   HGBA1C 5.9 10/08/2018   HGBA1C 6.2 (A) 08/04/2018  02/26/2018: HbA1c 6.5% 09/20/2017: HbA1c 7.3% 04/18/2017: HbA1c 7.6%  Currently on: - Glipizide XL 2.5 mg before breakfast -started 08/2019  Previously on: - Metformin 1000 mg 2x a day, with meals -added 11/2017 - had significant diarrhea >> metformin ER 1000 mg 2x a day which she was tolerating well, however, she then started to have severe diarrhea >> she stopped in 09/2018. We tried [place] in the past but this was too expensive.  Pt checks her sugars once a day: - am:119-158, 164, 181, 198 >> 108,  112-151, 163 >> 110-139, 142 - 2h after b'fast:n/c >> 66, 150-241 >> n/c - before lunch: 119 >> 86 >> 68 >> n/c >> 125 >> n/c - 2h after lunch: 119, 150 >> n/c >> 164 >> n/c - before dinner: 68 (Glipizide), 75-124 >> 168 >> n/c  - 2h after dinner: 73, 165 >> n/c >> 96, 108-181 >> 125, 153 >> n/c >> 125 - bedtime: n/c >> 113, 117 >> n/c >> 183 - nighttime: n/c >> 98, 108 Lowest sugar was 66 >> 90s >> 110; it is unclear at which level she has hypoglycemia awareness. Highest sugar was 246 >> 142.  Glucometer: One Touch verio  Pt's meals are: - Breakfast: eggs, cereal + fruit, egg waffles and sugar-free syrup  - Lunch: salad, grilled chicken - Dinner: baked meat, veggies, starch  - Snacks: 1-2x - at bedtime - granola  In the past, she was exercising at [employer] and also dancing 2-3 times a week.  However, she stopped during the coronavirus pandemic.  [facility]  -+ CKD stage III, last BUN/creatinine:  12/15/2019: 22/0.97, GFR 57, glucose 142  11/25/2019 27/0.98, GFR 55, glucose 91 Lab Results  Component Value Date   BUN 21 01/04/2020   BUN 25 (H) 12/22/2019   CREATININE 0.92 01/04/2020   CREATININE 0.85 12/22/2019  02/26/2018: 23/0.94 On losartan 50.  -+ HL; last set of lipids: 11/25/2019: 151/268/43/65 Lab Results  Component Value Date  CHOL 160 02/08/2019   HDL 49.90 02/08/2019   LDLCALC 70 12/22/2009   LDLDIRECT 66.0 02/08/2019   TRIG 257.0 (H) 02/08/2019   CHOLHDL 3 02/08/2019  On Crestor 5.  - last eye exam was in 04/2019: + Glaucoma, no DR. Dr. Katy Fitch.  She has a history of cataract surgery.  - no numbness and tingling in her feet.  Pt has FH of DM in father and brother.   She also has a history of HTN-on Lasix as needed, IBS with diarrhea, GERD, vaginal prolapse - h/o surgery.  She also has NASH with stage 2 fibrosis-currently in a new study.  She was previously in another study that was using a drug which helped her significantly (HbA1c  dropped to 6.2%), however, the study was stopped and the drug is not available.   She is on Remicade for inflammatory arthritis.  Vitamin B12 (11/25/2019): 504, normal.  ROS: Constitutional: no weight gain/no weight loss, no fatigue, no subjective hyperthermia, no subjective hypothermia Eyes: no blurry vision, no xerophthalmia ENT: no sore throat, no nodules palpated in neck, no dysphagia, no odynophagia, no hoarseness Cardiovascular: no CP/no SOB/no palpitations/+ leg swelling - prn Lasix for this Respiratory: no cough/no SOB/no wheezing Gastrointestinal: no N/no V/no D/no C/no acid reflux Musculoskeletal: no muscle aches/+ joint aches Skin: no rashes, no hair loss Neurological: no tremors/no numbness/no tingling/no dizziness  I reviewed pt's medications, allergies, PMH, social hx, family hx, and changes were documented in the history of present illness. Otherwise, unchanged from my initial visit note.  Past Medical History:  Diagnosis Date  . Anxiety   . Arthritis   . Chronic kidney disease   . Coronary artery disease    nonobstructive by cardiac catheterization 2011 with a 30% LAD lesion  . Depression   . Diabetes ([place])    Type 2  . GERD (gastroesophageal reflux disease)   . Glaucoma   . Hypercholesteremia   . Hypertension   . NASH (nonalcoholic steatohepatitis)    NASH  . Pneumonia    30 years ago  . PVC (premature ventricular contraction)   . Seasonal allergies   . Sleep apnea   . Spinal stenosis    Past Surgical History:  Procedure Laterality Date  . ANTERIOR AND POSTERIOR VAGINAL REPAIR    . APPENDECTOMY  1959  . BLADDER SUSPENSION    . BUNIONECTOMY    . CARDIAC CATHETERIZATION  2011   clean cath  . CARDIOVASCULAR STRESS TEST    . CATARACT EXTRACTION W/ INTRAOCULAR LENS IMPLANT Right 2010  . KNEE ARTHROSCOPY Left   . NASAL RECONSTRUCTION  1976  . SHOULDER ARTHROSCOPY Left   . TOTAL ABDOMINAL HYSTERECTOMY  1976  . TOTAL KNEE ARTHROPLASTY Left 01/03/2020    Procedure: TOTAL KNEE ARTHROPLASTY;  Surgeon: Gaynelle Arabian, MD;  Location: [facility];  Service: Orthopedics;  Laterality: Left;  52mn   Social History   Socioeconomic History  . Marital status: Married    Spouse name: Not on file  . Number of children: 1  . Years of education: Not on file  . Highest education level: Not on file  Occupational History  . Occupation: RTherapist, sports- retired    [employer]     Comment: [place] Tobacco Use  . Smoking status: Never Smoker  . Smokeless tobacco: Never Used  Substance and Sexual Activity  . Alcohol use: No  . Drug use: No   Current Outpatient Medications on File Prior to Visit  Medication Sig  Dispense Refill  . ALPRAZolam (XANAX) 0.5 MG tablet Take 0.25 mg by mouth at bedtime as needed for anxiety.    . brimonidine (ALPHAGAN) 0.2 % ophthalmic solution Place 1 drop into both eyes 2 (two) times daily.    . Cholecalciferol (VITAMIN D) 2000 UNITS CAPS Take 2,000 Units by mouth daily.     . Cranberry-Vitamin C-Vitamin E (CRANBERRY PLUS VITAMIN C) 4200-20-3 MG-MG-UNIT CAPS Take by mouth.    . escitalopram (LEXAPRO) 10 MG tablet Take 10 mg by mouth at bedtime.     Marland Kitchen glipiZIDE (GLUCOTROL XL) 2.5 MG 24 hr tablet Take 1 tablet (2.5 mg total) by mouth daily before breakfast. 90 tablet 3  . hydrochlorothiazide (MICROZIDE) 12.5 MG capsule Take 12.5 mg by mouth every evening.     . inFLIXimab (REMICADE) 100 MG injection Inject 100 mg into the vein every 6 (six) weeks.    Marland Kitchen latanoprost (XALATAN) 0.005 % ophthalmic solution Place 1 drop into both eyes at bedtime.    Marland Kitchen losartan (COZAAR) 50 MG tablet Take 50 mg by mouth at bedtime.    . meclizine (ANTIVERT) 12.5 MG tablet Take 1 tablet (12.5 mg total) by mouth 3 (three) times daily as needed for dizziness. 21 tablet 0  . methocarbamol (ROBAXIN) 500 MG tablet Take 1 tablet (500 mg total) by mouth every 6 (six) hours as needed for muscle spasms. 40 tablet 0  . Misc Natural Products (TURMERIC CURCUMIN)  CAPS Take 1 capsule by mouth daily. 1500 mg/capsule    . omeprazole (PRILOSEC) 20 MG capsule Take 20 mg by mouth daily as needed (acid reflux/indigestion.). Pt takes as needed.    Marland Kitchen oxyCODONE (OXY IR/ROXICODONE) 5 MG immediate release tablet Take 1-2 tablets (5-10 mg total) by mouth every 6 (six) hours as needed for moderate pain or severe pain. 42 tablet 0  . rosuvastatin (CRESTOR) 5 MG tablet Take 5 mg by mouth at bedtime.     . timolol (TIMOPTIC) 0.5 % ophthalmic solution Place 1 drop into both eyes daily.    . vitamin B-12 (CYANOCOBALAMIN) 1000 MCG tablet Take 1,000 mcg by mouth daily.     No current facility-administered medications on file prior to visit.   Also, Metformin 1000 mg 2x a day.  Allergies  Allergen Reactions  . Metformin And Related Diarrhea  . Gabapentin Palpitations  . Sulfamethoxazole-Trimethoprim Rash  . Tape Rash   Family History  Problem Relation Age of Onset  . Heart disease Father        open heart surgery at 27  . Colon cancer Father   . Heart disease Mother        open heart surgery at age 13  . Hypertension Mother   . Heart attack Maternal Grandfather   . Stroke Paternal Grandmother   . Heart block Brother     PE: BP 138/72 (BP Location: Right Arm, Patient Position: Sitting, Cuff Size: Normal)   Pulse 93   Ht 5' 6"  (1.676 m)   Wt 172 lb 9.6 oz (78.3 kg)   SpO2 95%   BMI 27.86 kg/m   Wt Readings from Last 3 Encounters:  04/18/20 172 lb 9.6 oz (78.3 kg)  01/03/20 173 lb (78.5 kg)  12/22/19 173 lb (78.5 kg)   Constitutional: overweight, in NAD Eyes: PERRLA, EOMI, no exophthalmos ENT: moist mucous membranes, no thyromegaly, no cervical lymphadenopathy Cardiovascular: RRR, No MRG Respiratory: CTA B Gastrointestinal: abdomen soft, NT, ND, BS+ Musculoskeletal: no deformities, strength intact in all 4 Skin: moist, warm,  no rashes Neurological: no tremor with outstretched hands, DTR normal in all 4  ASSESSMENT: 1. DM2, non-insulin-dependent,  uncontrolled, with long-term complications - CAD - CKD stage III - NASH  2. HL  3. Overweight  PLAN:  1. Patient with longstanding, previously uncontrolled type 2 diabetes, with improved control after starting [place].  However, this was not affordable so she came off.  She was previously on Metformin ER but she could not tolerate it due to severe diarrhea.  Therefore, she is now on low-dose glipizide XL.  HbA1c before last visit was better, at 6.5%, at goal.  Sugars were slightly higher than goal in the morning at that time but improving later in the day.  I did not suggest an increase in the glipizide dose since she did have low blood sugars in the past. -At today's visit, sugars remain at goal in the morning with 3 exceptions but she is not usually checking sugars later in the day.  I again advised her to start checking.  She does not report any hypoglycemic symptoms.  Therefore, for now, we will continue the same dose of glipizide. - I suggested to:  Patient Instructions  Please continue: - Glipizide XL 2.5 mg daily before b'fast  Check sugars once a day, rotating check times.  Please return in 4 months with your sugar log.   - we checked her HbA1c: 6.1% (improved, excellent) - advised to check sugars at different times of the day - 1x a day, rotating check times - advised for yearly eye exams >> she is UTD and has an appointment coming up - return to clinic in 4 months  2. HL -Reviewed latest lipid panel from 11/2019: Triglycerides still high, slightly higher than before, at 268, LDL at goal, at 65 -Continues Crestor 5 without side effects  3. Overweight -She could not afford SGLT2 inhibitors which would have helped with weight loss -Gained 6 pounds before last visit -Lost 1 pound since then  Philemon Kingdom, MD PhD [facility]

## 2020-04-20 DIAGNOSIS — Z85828 Personal history of other malignant neoplasm of skin: Secondary | ICD-10-CM | POA: Diagnosis not present

## 2020-04-20 DIAGNOSIS — L820 Inflamed seborrheic keratosis: Secondary | ICD-10-CM | POA: Diagnosis not present

## 2020-04-24 DIAGNOSIS — K7581 Nonalcoholic steatohepatitis (NASH): Secondary | ICD-10-CM | POA: Diagnosis not present

## 2020-05-01 DIAGNOSIS — M0609 Rheumatoid arthritis without rheumatoid factor, multiple sites: Secondary | ICD-10-CM | POA: Diagnosis not present

## 2020-05-09 DIAGNOSIS — H401213 Low-tension glaucoma, right eye, severe stage: Secondary | ICD-10-CM | POA: Diagnosis not present

## 2020-05-09 DIAGNOSIS — H401222 Low-tension glaucoma, left eye, moderate stage: Secondary | ICD-10-CM | POA: Diagnosis not present

## 2020-05-29 DIAGNOSIS — M2550 Pain in unspecified joint: Secondary | ICD-10-CM | POA: Diagnosis not present

## 2020-05-29 DIAGNOSIS — E663 Overweight: Secondary | ICD-10-CM | POA: Diagnosis not present

## 2020-05-29 DIAGNOSIS — Z6828 Body mass index (BMI) 28.0-28.9, adult: Secondary | ICD-10-CM | POA: Diagnosis not present

## 2020-05-29 DIAGNOSIS — Z79899 Other long term (current) drug therapy: Secondary | ICD-10-CM | POA: Diagnosis not present

## 2020-05-29 DIAGNOSIS — K760 Fatty (change of) liver, not elsewhere classified: Secondary | ICD-10-CM | POA: Diagnosis not present

## 2020-05-29 DIAGNOSIS — M150 Primary generalized (osteo)arthritis: Secondary | ICD-10-CM | POA: Diagnosis not present

## 2020-05-29 DIAGNOSIS — M0609 Rheumatoid arthritis without rheumatoid factor, multiple sites: Secondary | ICD-10-CM | POA: Diagnosis not present

## 2020-06-06 DIAGNOSIS — U071 COVID-19: Secondary | ICD-10-CM | POA: Diagnosis not present

## 2020-06-07 ENCOUNTER — Telehealth: Payer: Self-pay | Admitting: Unknown Physician Specialty

## 2020-06-07 ENCOUNTER — Ambulatory Visit (INDEPENDENT_AMBULATORY_CARE_PROVIDER_SITE_OTHER): Payer: Medicare Other

## 2020-06-07 ENCOUNTER — Other Ambulatory Visit: Payer: Self-pay | Admitting: Unknown Physician Specialty

## 2020-06-07 ENCOUNTER — Telehealth: Payer: Self-pay

## 2020-06-07 ENCOUNTER — Other Ambulatory Visit: Payer: Self-pay

## 2020-06-07 DIAGNOSIS — U071 COVID-19: Secondary | ICD-10-CM

## 2020-06-07 DIAGNOSIS — E1165 Type 2 diabetes mellitus with hyperglycemia: Secondary | ICD-10-CM

## 2020-06-07 DIAGNOSIS — I10 Essential (primary) hypertension: Secondary | ICD-10-CM

## 2020-06-07 MED ORDER — FAMOTIDINE IN NACL 20-0.9 MG/50ML-% IV SOLN: 20.0000 mg | Freq: Once | INTRAVENOUS | Status: AC | PRN

## 2020-06-07 MED ORDER — EPINEPHRINE 0.3 MG/0.3ML IJ SOAJ: 0.3000 mg | Freq: Once | INTRAMUSCULAR | Status: AC | PRN

## 2020-06-07 MED ORDER — METHYLPREDNISOLONE SODIUM SUCC 125 MG IJ SOLR: 125.0000 mg | Freq: Once | INTRAMUSCULAR | Status: AC | PRN

## 2020-06-07 MED ORDER — BEBTELOVIMAB 175 MG/2 ML IV (EUA)
175.0000 mg | Freq: Once | INTRAMUSCULAR | Status: AC
  Administered 2020-06-07: 175 mg via INTRAVENOUS

## 2020-06-07 MED ORDER — ALBUTEROL SULFATE HFA 108 (90 BASE) MCG/ACT IN AERS: 2.0000 | INHALATION_SPRAY | Freq: Once | RESPIRATORY_TRACT | Status: AC | PRN

## 2020-06-07 MED ORDER — DIPHENHYDRAMINE HCL 50 MG/ML IJ SOLN
50.0000 mg | Freq: Once | INTRAMUSCULAR | Status: AC | PRN
Start: 2020-06-07 — End: 2020-06-07

## 2020-06-07 MED ORDER — SODIUM CHLORIDE 0.9 % IV SOLN: INTRAVENOUS | Status: DC | PRN

## 2020-06-07 NOTE — Patient Instructions (Signed)
10 Things You Can Do to Manage Your COVID-19 Symptoms at Home If you have possible or confirmed COVID-19: 1. Stay home except to get medical care. 2. Monitor your symptoms carefully. If your symptoms get worse, call your healthcare provider immediately. 3. Get rest and stay hydrated. 4. If you have a medical appointment, call the healthcare provider ahead of time and tell them that you have or may have COVID-19. 5. For medical emergencies, call 911 and notify the dispatch personnel that you have or may have COVID-19. 6. Cover your cough and sneezes with a tissue or use the inside of your elbow. 7. Wash your hands often with soap and water for at least 20 seconds or clean your hands with an alcohol-based hand sanitizer that contains at least 60% alcohol. 8. As much as possible, stay in a specific room and away from other people in your home. Also, you should use a separate bathroom, if available. If you need to be around other people in or outside of the home, wear a mask. 9. Avoid sharing personal items with other people in your household, like dishes, towels, and bedding. 10. Clean all surfaces that are touched often, like counters, tabletops, and doorknobs. Use household cleaning sprays or wipes according to the label instructions. [email or web address] 08/20/2019 This information is not intended to replace advice given to you by your health care provider. Make sure you discuss any questions you have with your health care provider. Document Revised: 12/06/2019 Document Reviewed: 12/06/2019 Elsevier Patient Education  2021 [place].  What types of side effects do monoclonal antibody drugs cause?  Common side effects  In general, the more common side effects caused by monoclonal antibody drugs include: . Allergic reactions, such as hives or itching . Flu-like signs and symptoms, including chills, fatigue, fever, and muscle aches and pains . Nausea, vomiting . Diarrhea . Skin  rashes . Low blood pressure   The CDC is recommending patients who receive monoclonal antibody treatments wait at least 90 days before being vaccinated.  Currently, there are no data on the safety and efficacy of mRNA COVID-19 vaccines in persons who received monoclonal antibodies or convalescent plasma as part of COVID-19 treatment. Based on the estimated half-life of such therapies as well as evidence suggesting that reinfection is uncommon in the 90 days after initial infection, vaccination should be deferred for at least 90 days, as a precautionary measure until additional information becomes available, to avoid interference of the antibody treatment with vaccine-induced immune responses.

## 2020-06-07 NOTE — Progress Notes (Addendum)
Diagnosis: COVID  Provider:  Marshell Garfinkel, MD  Procedure: Infusion  IV Type: Peripheral, IV Location: L Antecubital  Bebtelovimab, Dose: 177m  Infusion Start Time: 17124 Infusion Stop Time: 15809 Post Infusion IV Care: Observation period completed and Peripheral IV Discontinued  Discharge: Condition: Good, Destination: Home . AVS provided to patient.   Performed by:  CKoren Shiver RN

## 2020-06-07 NOTE — Telephone Encounter (Signed)
I connected by phone with Tammy Pineda on 06/07/2020 at 11:07 AM to discuss the potential use of a new treatment for mild to moderate COVID-19 viral infection in non-hospitalized patients.  This patient is a 75 y.o. female that meets the FDA criteria for Emergency Use Authorization of COVID monoclonal antibody bebtelovimab.  Has a (+) direct SARS-CoV-2 viral test result  Has mild or moderate COVID-19   Is NOT hospitalized due to COVID-19  Is within 10 days of symptom onset  Has at least one of the high risk factor(s) for progression to severe COVID-19 and/or hospitalization as defined in EUA.  Specific high risk criteria : BMI > 25, Diabetes and Immunosuppressive Disease or Treatment   I have spoken and communicated the following to the patient or parent/caregiver regarding COVID monoclonal antibody treatment:  1. FDA has authorized the emergency use for the treatment of mild to moderate COVID-19 in adults and pediatric patients with positive results of direct SARS-CoV-2 viral testing who are 34 years of age and older weighing at least 40 kg, and who are at high risk for progressing to severe COVID-19 and/or hospitalization.  2. The significant known and potential risks and benefits of COVID monoclonal antibody, and the extent to which such potential risks and benefits are unknown.  3. Information on available alternative treatments and the risks and benefits of those alternatives, including clinical trials.  4. Patients treated with COVID monoclonal antibody should continue to self-isolate and use infection control measures (e.g., wear mask, isolate, social distance, avoid sharing personal items, clean and disinfect "high touch" surfaces, and frequent handwashing) according to CDC guidelines.   5. The patient or parent/caregiver has the option to accept or refuse COVID monoclonal antibody treatment.  6. Discussion about the monoclonal antibody infusion does not ensure treatment. The  patient will be placed on a list and scheduled according to risk, symptom onset and availability. A scheduler will reach to the patient to let them know if we can accommodate their infusion or not.  After reviewing this information with the patient, the patient has agreed to receive one of the available covid 19 monoclonal antibodies and will be provided an appropriate fact sheet prior to infusion. Tammy Haddock, NP 06/07/2020 11:07 AM  Sx onset 5/1

## 2020-06-07 NOTE — Telephone Encounter (Signed)
Called to discuss with patient about COVID-19 symptoms and the use of one of the available treatments for those with mild to moderate Covid symptoms and at a high risk of hospitalization.  Pt appears to qualify for outpatient treatment due to co-morbid conditions and/or a member of an at-risk group in accordance with the FDA Emergency Use Authorization.    Symptom onset: 06/04/20 Nausea,cough,fever Vaccinated: yES Booster?Yes Immunocompromised? Yes Qualifiers: NASH,DM NIH Criteria:   Pt. Would like to speak with APP.  Marcello Moores

## 2020-06-08 ENCOUNTER — Ambulatory Visit: Payer: Medicare Other

## 2020-06-13 DIAGNOSIS — I10 Essential (primary) hypertension: Secondary | ICD-10-CM | POA: Diagnosis not present

## 2020-06-13 DIAGNOSIS — E1122 Type 2 diabetes mellitus with diabetic chronic kidney disease: Secondary | ICD-10-CM | POA: Diagnosis not present

## 2020-06-13 DIAGNOSIS — K219 Gastro-esophageal reflux disease without esophagitis: Secondary | ICD-10-CM | POA: Diagnosis not present

## 2020-06-13 DIAGNOSIS — E7800 Pure hypercholesterolemia, unspecified: Secondary | ICD-10-CM | POA: Diagnosis not present

## 2020-06-13 DIAGNOSIS — M1990 Unspecified osteoarthritis, unspecified site: Secondary | ICD-10-CM | POA: Diagnosis not present

## 2020-06-13 DIAGNOSIS — N183 Chronic kidney disease, stage 3 unspecified: Secondary | ICD-10-CM | POA: Diagnosis not present

## 2020-06-13 DIAGNOSIS — I2510 Atherosclerotic heart disease of native coronary artery without angina pectoris: Secondary | ICD-10-CM | POA: Diagnosis not present

## 2020-06-13 DIAGNOSIS — H401390 Pigmentary glaucoma, unspecified eye, stage unspecified: Secondary | ICD-10-CM | POA: Diagnosis not present

## 2020-06-19 ENCOUNTER — Emergency Department (HOSPITAL_BASED_OUTPATIENT_CLINIC_OR_DEPARTMENT_OTHER)
Admission: EM | Admit: 2020-06-19 | Discharge: 2020-06-19 | Disposition: A | Discharge status: Home / Self Care | Payer: Medicare Other | Attending: Emergency Medicine | Admitting: Emergency Medicine

## 2020-06-19 ENCOUNTER — Other Ambulatory Visit: Payer: Self-pay

## 2020-06-19 ENCOUNTER — Encounter (HOSPITAL_BASED_OUTPATIENT_CLINIC_OR_DEPARTMENT_OTHER): Payer: Self-pay

## 2020-06-19 DIAGNOSIS — Z79899 Other long term (current) drug therapy: Secondary | ICD-10-CM | POA: Diagnosis not present

## 2020-06-19 DIAGNOSIS — J45909 Unspecified asthma, uncomplicated: Secondary | ICD-10-CM | POA: Insufficient documentation

## 2020-06-19 DIAGNOSIS — E1122 Type 2 diabetes mellitus with diabetic chronic kidney disease: Secondary | ICD-10-CM | POA: Insufficient documentation

## 2020-06-19 DIAGNOSIS — Z7984 Long term (current) use of oral hypoglycemic drugs: Secondary | ICD-10-CM | POA: Insufficient documentation

## 2020-06-19 DIAGNOSIS — R059 Cough, unspecified: Secondary | ICD-10-CM | POA: Diagnosis not present

## 2020-06-19 DIAGNOSIS — U071 COVID-19: Secondary | ICD-10-CM | POA: Insufficient documentation

## 2020-06-19 DIAGNOSIS — Z96652 Presence of left artificial knee joint: Secondary | ICD-10-CM | POA: Diagnosis not present

## 2020-06-19 DIAGNOSIS — I129 Hypertensive chronic kidney disease with stage 1 through stage 4 chronic kidney disease, or unspecified chronic kidney disease: Secondary | ICD-10-CM | POA: Insufficient documentation

## 2020-06-19 DIAGNOSIS — I2510 Atherosclerotic heart disease of native coronary artery without angina pectoris: Secondary | ICD-10-CM | POA: Insufficient documentation

## 2020-06-19 DIAGNOSIS — N189 Chronic kidney disease, unspecified: Secondary | ICD-10-CM | POA: Insufficient documentation

## 2020-06-19 NOTE — ED Provider Notes (Signed)
[facility] EMERGENCY DEPARTMENT Provider Note   CSN: [id number] Arrival date & time: 06/19/20  1228     History Chief Complaint  Patient presents with  . Cough    Tammy Pineda is a 75 y.o. female.  Saw PCP and has a prescription for a cough syrup. Was told to take Zyrtec.  The history is provided by the patient.  Cough Cough characteristics:  Non-productive Severity:  Moderate Onset quality:  Gradual Duration:  3 days (but tested positive for COVID 06/04/20 and had symptoms then improved a bit prior to getting this cough) Timing:  Intermittent Progression:  Unchanged Chronicity:  New Smoker: no   Relieved by:  Nothing Worsened by:  Nothing Ineffective treatments:  None tried Associated symptoms: sinus congestion   Associated symptoms: no chest pain, no chills, no ear pain, no fever, no rash, no shortness of breath and no sore throat   Risk factors: recent infection        Past Medical History:  Diagnosis Date  . Anxiety   . Arthritis   . Chronic kidney disease   . Coronary artery disease    nonobstructive by cardiac catheterization 2011 with a 30% LAD lesion  . Depression   . Diabetes ([place])    Type 2  . GERD (gastroesophageal reflux disease)   . Glaucoma   . Hypercholesteremia   . Hypertension   . NASH (nonalcoholic steatohepatitis)    NASH  . Pneumonia    30 years ago  . PVC (premature ventricular contraction)   . Seasonal allergies   . Sleep apnea   . Spinal stenosis     Patient Active Problem List   Diagnosis Date Noted  . OA (osteoarthritis) of knee 01/03/2020  . Primary osteoarthritis of left knee 01/03/2020  . Type 2 diabetes mellitus with hyperglycemia, without long-term current use of insulin ([place]) 04/27/2018  . Jaw pain 11/19/2012  . Obstructive sleep apnea 08/23/2012  . CAD, NATIVE VESSEL 12/21/2009  . PALPITATIONS 10/19/2009  . HYPERLIPIDEMIA 09/26/2008  . Essential hypertension 09/26/2008  . REACTIVE AIRWAY DISEASE  09/26/2008  . GERD 09/26/2008  . ARTHROSCOPY, LEFT KNEE, HX OF 09/26/2008    Past Surgical History:  Procedure Laterality Date  . ANTERIOR AND POSTERIOR VAGINAL REPAIR    . APPENDECTOMY  1959  . BLADDER SUSPENSION    . BUNIONECTOMY    . CARDIAC CATHETERIZATION  2011   clean cath  . CARDIOVASCULAR STRESS TEST    . CATARACT EXTRACTION W/ INTRAOCULAR LENS IMPLANT Right 2010  . KNEE ARTHROSCOPY Left   . NASAL RECONSTRUCTION  1976  . SHOULDER ARTHROSCOPY Left   . TOTAL ABDOMINAL HYSTERECTOMY  1976  . TOTAL KNEE ARTHROPLASTY Left 01/03/2020   Procedure: TOTAL KNEE ARTHROPLASTY;  Surgeon: Gaynelle Arabian, MD;  Location: [facility];  Service: Orthopedics;  Laterality: Left;  53mn     OB History   No obstetric history on file.     Family History  Problem Relation Age of Onset  . Heart disease Father        open heart surgery at 881 . Colon cancer Father   . Heart disease Mother        open heart surgery at age 77 . Hypertension Mother   . Heart attack Maternal Grandfather   . Stroke Paternal Grandmother   . Heart block Brother     Social History   Tobacco Use  . Smoking status: Never Smoker  . Smokeless tobacco: Never Used  Vaping Use  . Vaping Use: Never used  Substance Use Topics  . Alcohol use: No  . Drug use: No    Home Medications Prior to Admission medications   Medication Sig Start Date End Date Taking? Authorizing Provider  ALPRAZolam Duanne Moron) 0.5 MG tablet Take 0.25 mg by mouth at bedtime as needed for anxiety.    [provider]  brimonidine (ALPHAGAN) 0.2 % ophthalmic solution Place 1 drop into both eyes 2 (two) times daily.    [provider]  Cholecalciferol (VITAMIN D) 2000 UNITS CAPS Take 2,000 Units by mouth daily.     [provider]  Cranberry-Vitamin C-Vitamin E (CRANBERRY PLUS VITAMIN C) 4200-20-3 MG-MG-UNIT CAPS Take by mouth.    [provider]  escitalopram (LEXAPRO) 10 MG tablet Take 10 mg by mouth at bedtime.   06/24/14   [provider]  glipiZIDE (GLUCOTROL XL) 2.5 MG 24 hr tablet Take 1 tablet (2.5 mg total) by mouth daily before breakfast. 03/27/20   Philemon Kingdom, MD  hydrochlorothiazide (MICROZIDE) 12.5 MG capsule Take 12.5 mg by mouth every evening.  04/05/19   [provider]  inFLIXimab (REMICADE) 100 MG injection Inject 100 mg into the vein every 6 (six) weeks.    [provider]  latanoprost (XALATAN) 0.005 % ophthalmic solution Place 1 drop into both eyes at bedtime.    [provider]  losartan (COZAAR) 50 MG tablet Take 50 mg by mouth at bedtime. 10/15/19   [provider]  meclizine (ANTIVERT) 12.5 MG tablet Take 1 tablet (12.5 mg total) by mouth 3 (three) times daily as needed for dizziness. 05/16/19   Carmin Muskrat, MD  methocarbamol (ROBAXIN) 500 MG tablet Take 1 tablet (500 mg total) by mouth every 6 (six) hours as needed for muscle spasms. 01/04/20   Edmisten, Ok Anis, PA  Misc Natural Products (TURMERIC CURCUMIN) CAPS Take 1 capsule by mouth daily. 1500 mg/capsule    [provider]  omeprazole (PRILOSEC) 20 MG capsule Take 20 mg by mouth daily as needed (acid reflux/indigestion.). Pt takes as needed. 02/15/19   [provider]  oxyCODONE (OXY IR/ROXICODONE) 5 MG immediate release tablet Take 1-2 tablets (5-10 mg total) by mouth every 6 (six) hours as needed for moderate pain or severe pain. 01/04/20   Edmisten, Kristie L, PA  rosuvastatin (CRESTOR) 5 MG tablet Take 5 mg by mouth at bedtime.  04/05/19   [provider]  timolol (TIMOPTIC) 0.5 % ophthalmic solution Place 1 drop into both eyes daily. 08/19/19   [provider]  vitamin B-12 (CYANOCOBALAMIN) 1000 MCG tablet Take 1,000 mcg by mouth daily.    [provider]    Allergies    Metformin and related, Gabapentin, Sulfamethoxazole-trimethoprim, and Tape  Review of Systems   Review of Systems  Constitutional: Negative for chills and fever.   HENT: Negative for ear pain and sore throat.   Eyes: Negative for pain and visual disturbance.  Respiratory: Positive for cough. Negative for shortness of breath.   Cardiovascular: Negative for chest pain and palpitations.  Gastrointestinal: Negative for abdominal pain and vomiting.  Genitourinary: Negative for dysuria and hematuria.  Musculoskeletal: Negative for arthralgias and back pain.  Skin: Negative for color change and rash.  Neurological: Negative for seizures and syncope.  All other systems reviewed and are negative.   Physical Exam Updated Vital Signs BP 104/79 (BP Location: Right Arm)   Pulse 73   Temp 98.3 F (36.8 C) (Oral)   Resp 18  Ht 5' 6"  (1.676 m)   Wt 77.1 kg   SpO2 98%   BMI 27.44 kg/m   Physical Exam Vitals and nursing note reviewed.  Constitutional:      General: She is not in acute distress.    Appearance: She is well-developed.  HENT:     Head: Normocephalic and atraumatic.     Nose: Nose normal.     Mouth/Throat:     Mouth: Mucous membranes are moist.     Pharynx: Oropharynx is clear. No posterior oropharyngeal erythema.  Eyes:     Conjunctiva/sclera: Conjunctivae normal.  Cardiovascular:     Rate and Rhythm: Normal rate and regular rhythm.     Heart sounds: No murmur heard.   Pulmonary:     Effort: Pulmonary effort is normal. No respiratory distress.     Breath sounds: Normal breath sounds.  Musculoskeletal:     Cervical back: Neck supple.  Skin:    General: Skin is warm and dry.  Neurological:     General: No focal deficit present.     Mental Status: She is alert.  Psychiatric:        Mood and Affect: Mood normal.     ED Results / Procedures / Treatments   Labs (all labs ordered are listed, but only abnormal results are displayed) Labs Reviewed - No data to display  EKG None  Radiology No results found.  Procedures Procedures   Medications Ordered in ED Medications - No data to display  ED Course  I have  reviewed the triage vital signs and the nursing notes.  Pertinent labs & imaging results that were available during my care of the patient were reviewed by me and considered in my medical decision making (see chart for details).    MDM Rules/Calculators/A&P                          Well-appearing patient s/p COVID diagnosis 15 days ago. Complaining of persistent cough. Normal vital signs. Recommend ongoing symptomatic management. No further diagnostic testing would change management. Antibiotic use has not proven helpful. Reassurance given. Final Clinical Impression(s) / ED Diagnoses Final diagnoses:  Cough  COVID-19    Rx / DC Orders ED Discharge Orders    None       Arnaldo Natal, MD 06/19/20 1544

## 2020-06-19 NOTE — ED Triage Notes (Signed)
Cough/sinus pain/drainage x3 days s/p + covid test on 5/1.  Pt has been rx cough meds from PCP but has not picked them up yet d/t driving her husband here.  Denies CP/SOB

## 2020-06-19 NOTE — ED Notes (Signed)
ED Provider at bedside. 

## 2020-06-29 DIAGNOSIS — Z96652 Presence of left artificial knee joint: Secondary | ICD-10-CM | POA: Diagnosis not present

## 2020-07-24 DIAGNOSIS — M0609 Rheumatoid arthritis without rheumatoid factor, multiple sites: Secondary | ICD-10-CM | POA: Diagnosis not present

## 2020-07-27 ENCOUNTER — Encounter: Payer: Self-pay | Admitting: Internal Medicine

## 2020-07-27 ENCOUNTER — Other Ambulatory Visit: Payer: Self-pay

## 2020-07-27 ENCOUNTER — Ambulatory Visit (INDEPENDENT_AMBULATORY_CARE_PROVIDER_SITE_OTHER): Payer: Medicare Other | Admitting: Internal Medicine

## 2020-07-27 VITALS — BP 132/80 | HR 80 | Ht 66.0 in | Wt 172.0 lb

## 2020-07-27 DIAGNOSIS — E663 Overweight: Secondary | ICD-10-CM

## 2020-07-27 DIAGNOSIS — E1165 Type 2 diabetes mellitus with hyperglycemia: Secondary | ICD-10-CM | POA: Diagnosis not present

## 2020-07-27 DIAGNOSIS — E785 Hyperlipidemia, unspecified: Secondary | ICD-10-CM | POA: Diagnosis not present

## 2020-07-27 LAB — POCT GLYCOSYLATED HEMOGLOBIN (HGB A1C): Hemoglobin A1C: 6.1 % — AB (ref 4.0–5.6)

## 2020-07-27 NOTE — Patient Instructions (Signed)
Please continue: - Glipizide XL 2.5 mg daily before b'fast   Check sugars once a day, rotating check times.   Please return in 4 months.

## 2020-07-27 NOTE — Progress Notes (Signed)
Patient ID: Tammy Pineda, female   DOB: [birth year], 75 y.o.   MRN: [id number]   This visit occurred during the SARS-CoV-2 public health emergency.  Safety protocols were in place, including screening questions prior to the visit, additional usage of staff PPE, and extensive cleaning of exam room while observing appropriate contact time as indicated for disinfecting solutions.    HPI: Tammy Pineda is a 75 y.o.-year-old female, initially referred by her PCP, Dr. Drema Dallas, returning for follow-up for DM2, dx "several years ago", non-insulin-dependent, uncontrolled, with complications (CAD, CKD stage III, NASH). Tammy Pineda, her husband, is also my pt. Last visit 4 mo ago.  Interim history: She had left TKR 01/03/2020.  At last visit she was still having a lot of pain after the surgery.  She also had a DVT in the L leg - on Xarelto x 3 mo.  She will also need a right TKR but she would like to postpone this for as long as possible. She had COVID-19 in 06/2020.  She is now feeling better. She continues to have a lot of stress at home with husband having multiple medical problems and also taking care of her sister who has cognitive impairment.  Reviewed HbA1c levels: Lab Results  Component Value Date   HGBA1C 6.1 (A) 04/18/2020   HGBA1C 6.7 (A) 08/11/2019   HGBA1C 6.9 (A) 05/03/2019   HGBA1C 6.4 (A) 02/08/2019   HGBA1C 5.9 10/08/2018   HGBA1C 6.2 (A) 08/04/2018  11/25/2019: HbA1c 6.5% 02/26/2018: HbA1c 6.5% 09/20/2017: HbA1c 7.3% 04/18/2017: HbA1c 7.6%  Currently on: - Glipizide XL 2.5 mg before breakfast -started 08/2019  Previously on: - Metformin 1000 mg 2x a day, with meals -added 11/2017 - had significant diarrhea >> metformin ER 1000 mg 2x a day which she was tolerating well, however, she then started to have severe diarrhea >> she stopped in 09/2018. We tried [place] in the past but this was too expensive.  Pt checks her sugars once a day: - am: 108, 112-151, 163 >> 110-139, 142 >>  88-142, 150 - 2h after b'fast:n/c >> 66, 150-241 >> n/c - before lunch: 119 >> 86 >> 68 >> n/c >> 125 >> n/c - 2h after lunch: 119, 150 >> n/c >> 164 >> n/c - before dinner: 68, 75-124 >> 168 >> n/c >> 57, 131, 138, 140 - 2h after dinner: 96, 108-181 >> 125, 153 >> n/c >> 125 >> 162 - bedtime: n/c >> 113, 117 >> n/c >> 183 - nighttime: n/c >> 98, 108 Lowest sugar was 66 >> 90s >> 110 >> 52 before dinner (ate only oatmeal that day); it is unclear at which level she has hypoglycemia awareness. Highest sugar was 246 >> 142 >> 162.  Glucometer: One Touch verio  Pt's meals are: - Breakfast: eggs, cereal + fruit, egg waffles and sugar-free syrup  - Lunch: salad, grilled chicken - Dinner: baked meat, veggies, starch  - Snacks: 1-2x - at bedtime - granola  In the past, she was exercising at [employer] and also dancing 2-3 times a week.  However, she stopped during the coronavirus pandemic.  [facility]  -+ CKD stage III, last BUN/creatinine:  11/25/2019 27/0.98, GFR 55, glucose 91 Lab Results  Component Value Date   BUN 21 01/04/2020   BUN 25 (H) 12/22/2019   CREATININE 0.92 01/04/2020   CREATININE 0.85 12/22/2019  02/26/2018: 23/0.94 On losartan 50.  -+ HL; last set of lipids: 11/25/2019: 151/268/43/65 Lab Results  Component Value Date   CHOL 160 02/08/2019   HDL 49.90 02/08/2019   LDLCALC 70 12/22/2009   LDLDIRECT 66.0 02/08/2019   TRIG 257.0 (H) 02/08/2019   CHOLHDL 3 02/08/2019  On Crestor 5.  - last eye exam was in 2022: + Glaucoma, no DR. Dr. Katy Fitch.  She has a history of cataract surgery.  - no numbness and tingling in her feet.  Pt has FH of DM in father and brother.   She also has a history of HTN-on Lasix as needed, IBS with diarrhea, GERD, vaginal prolapse - h/o surgery.  She also has NASH with stage 2 fibrosis-currently in a new study.  She was previously in another study that was using a drug which helped her significantly (HbA1c dropped to  6.2%), however, the study was stopped and the drug is not available.   She is on Remicade for inflammatory arthritis.  Vitamin B12 (11/25/2019): 504, normal.  ROS: Constitutional: no weight gain/no weight loss, no fatigue, no subjective hyperthermia, no subjective hypothermia Eyes: no blurry vision, no xerophthalmia ENT: no sore throat, no nodules palpated in neck, no dysphagia, no odynophagia, no hoarseness Cardiovascular: no CP/no SOB/no palpitations/+ leg swelling - prn Lasix for this Respiratory: no cough/no SOB/no wheezing Gastrointestinal: no N/no V/no D/no C/no acid reflux Musculoskeletal: no muscle aches/+ joint aches Skin: no rashes, no hair loss Neurological: no tremors/no numbness/no tingling/no dizziness  I reviewed pt's medications, allergies, PMH, social hx, family hx, and changes were documented in the history of present illness. Otherwise, unchanged from my initial visit note.  Past Medical History:  Diagnosis Date   Anxiety    Arthritis    Chronic kidney disease    Coronary artery disease    nonobstructive by cardiac catheterization 2011 with a 30% LAD lesion   Depression    Diabetes ([facility])    Type 2   GERD (gastroesophageal reflux disease)    Glaucoma    Hypercholesteremia    Hypertension    NASH (nonalcoholic steatohepatitis)    NASH   Pneumonia    30 years ago   PVC (premature ventricular contraction)    Seasonal allergies    Sleep apnea    Spinal stenosis    Past Surgical History:  Procedure Laterality Date   ANTERIOR AND POSTERIOR VAGINAL REPAIR     APPENDECTOMY  1959   BLADDER SUSPENSION     BUNIONECTOMY     CARDIAC CATHETERIZATION  2011   clean cath   CARDIOVASCULAR STRESS TEST     CATARACT EXTRACTION W/ INTRAOCULAR LENS IMPLANT Right 2010   KNEE ARTHROSCOPY Left    NASAL RECONSTRUCTION  1976   SHOULDER ARTHROSCOPY Left    TOTAL ABDOMINAL HYSTERECTOMY  1976   TOTAL KNEE ARTHROPLASTY Left 01/03/2020   Procedure: TOTAL KNEE ARTHROPLASTY;   Surgeon: Gaynelle Arabian, MD;  Location: [facility];  Service: Orthopedics;  Laterality: Left;  57mn   Social History   Socioeconomic History   Marital status: Married    Spouse name: Not on file   Number of children: 1   Years of education: Not on file   Highest education level: Not on file  Occupational History   Occupation: RTherapist, sports- retired    [employer] farm manager [employer]     Comment: [place] Tobacco Use   Smoking status: Never Smoker   Smokeless tobacco: Never Used  Substance and Sexual Activity   Alcohol use: No   Drug use: No   Current Outpatient Medications on File Prior  to Visit  Medication Sig Dispense Refill   ALPRAZolam (XANAX) 0.5 MG tablet Take 0.25 mg by mouth at bedtime as needed for anxiety.     brimonidine (ALPHAGAN) 0.2 % ophthalmic solution Place 1 drop into both eyes 2 (two) times daily.     Cholecalciferol (VITAMIN D) 2000 UNITS CAPS Take 2,000 Units by mouth daily.      Cranberry-Vitamin C-Vitamin E (CRANBERRY PLUS VITAMIN C) 4200-20-3 MG-MG-UNIT CAPS Take by mouth.     escitalopram (LEXAPRO) 10 MG tablet Take 10 mg by mouth at bedtime.      glipiZIDE (GLUCOTROL XL) 2.5 MG 24 hr tablet Take 1 tablet (2.5 mg total) by mouth daily before breakfast. 90 tablet 3   hydrochlorothiazide (MICROZIDE) 12.5 MG capsule Take 12.5 mg by mouth every evening.      inFLIXimab (REMICADE) 100 MG injection Inject 100 mg into the vein every 6 (six) weeks.     latanoprost (XALATAN) 0.005 % ophthalmic solution Place 1 drop into both eyes at bedtime.     losartan (COZAAR) 50 MG tablet Take 50 mg by mouth at bedtime.     meclizine (ANTIVERT) 12.5 MG tablet Take 1 tablet (12.5 mg total) by mouth 3 (three) times daily as needed for dizziness. 21 tablet 0   methocarbamol (ROBAXIN) 500 MG tablet Take 1 tablet (500 mg total) by mouth every 6 (six) hours as needed for muscle spasms. 40 tablet 0   Misc Natural Products (TURMERIC CURCUMIN) CAPS Take 1 capsule by mouth daily. 1500 mg/capsule      omeprazole (PRILOSEC) 20 MG capsule Take 20 mg by mouth daily as needed (acid reflux/indigestion.). Pt takes as needed.     oxyCODONE (OXY IR/ROXICODONE) 5 MG immediate release tablet Take 1-2 tablets (5-10 mg total) by mouth every 6 (six) hours as needed for moderate pain or severe pain. 42 tablet 0   rosuvastatin (CRESTOR) 5 MG tablet Take 5 mg by mouth at bedtime.      timolol (TIMOPTIC) 0.5 % ophthalmic solution Place 1 drop into both eyes daily.     vitamin B-12 (CYANOCOBALAMIN) 1000 MCG tablet Take 1,000 mcg by mouth daily.     Current Facility-Administered Medications on File Prior to Visit  Medication Dose Route Frequency Provider Last Rate Last Admin   0.9 %  sodium chloride infusion   Intravenous PRN Kathrine Haddock, NP       Also, Metformin 1000 mg 2x a day.  Allergies  Allergen Reactions   Metformin And Related Diarrhea   Gabapentin Palpitations   Sulfamethoxazole-Trimethoprim Rash   Tape Rash   Family History  Problem Relation Age of Onset   Heart disease Father        open heart surgery at 30   Colon cancer Father    Heart disease Mother        open heart surgery at age 18   Hypertension Mother    Heart attack Maternal Grandfather    Stroke Paternal Grandmother    Heart block Brother     PE: BP 132/80   Pulse 80   Ht 5' 6"  (1.676 m)   Wt 172 lb (78 kg)   SpO2 95%   BMI 27.76 kg/m   Wt Readings from Last 3 Encounters:  07/27/20 172 lb (78 kg)  06/19/20 170 lb (77.1 kg)  04/18/20 172 lb 9.6 oz (78.3 kg)   Constitutional: overweight, in NAD Eyes: PERRLA, EOMI, no exophthalmos ENT: moist mucous membranes, no thyromegaly, no cervical lymphadenopathy Cardiovascular: RRR, No  MRG Respiratory: CTA B Gastrointestinal: abdomen soft, NT, ND, BS+ Musculoskeletal: no deformities, strength intact in all 4 Skin: moist, warm, no rashes Neurological: no tremor with outstretched hands, DTR normal in all 4  ASSESSMENT: 1. DM2, non-insulin-dependent, uncontrolled, with  long-term complications - CAD - CKD stage III - NASH  2. HL  3. Overweight  PLAN:  1. Patient with longstanding, previously uncontrolled, type 2 diabetes, with improved control after initially starting [place], however, this was not affordable, so she had to come off.  She could also not tolerate metformin ER due to diarrhea.  Therefore, she is now only on low-dose glipizide XL, 2.5 mg daily.  At last visit, sugars were at goal in the morning with 3 exceptions, but she was not checking later in the day and I advised her to start checking.  She did not report any hypoglycemic symptoms.  HbA1c was better, at goal, at 6.1%.  We did not change her regimen. -At today's visit, sugars are mostly at goal with 1 hypoglycemic exception, at 52 late afternoon, after she only had oatmeal that morning.  Since otherwise sugars are at goal, with few very mild hyperglycemic exceptions, we will continue the same regimen for now. - I suggested to: Patient Instructions  Please continue: - Glipizide XL 2.5 mg daily before b'fast  Check sugars once a day, rotating check times.  Please return in 4 months with your sugar log.   - we checked her HbA1c: 6.1% (stable) - advised to check sugars at different times of the day - 1x a day, rotating check times - advised for yearly eye exams >> she is UTD - return to clinic in 4 months  2. HL -Reviewed latest lipid panel from 11/2019: Triglycerides still high, higher than before, LDL at goal  -Continues Crestor 5 mg daily without side effects  3. Overweight -She could not afford SGLT2 inhibitors which would have helped with weight loss -Lost 1 pound before last visit and weight stable now  Philemon Kingdom, MD PhD [facility]

## 2020-07-27 NOTE — Addendum Note (Signed)
Addended by: Elta Guadeloupe on: 07/27/2020 03:08 PM   Modules accepted: Orders

## 2020-07-28 ENCOUNTER — Ambulatory Visit
Admission: RE | Admit: 2020-07-28 | Discharge: 2020-07-28 | Disposition: A | Discharge status: Home / Self Care | Payer: Medicare Other | Source: Ambulatory Visit | Attending: Family Medicine | Admitting: Family Medicine

## 2020-07-28 DIAGNOSIS — Z1231 Encounter for screening mammogram for malignant neoplasm of breast: Secondary | ICD-10-CM | POA: Diagnosis not present

## 2020-08-04 DIAGNOSIS — K219 Gastro-esophageal reflux disease without esophagitis: Secondary | ICD-10-CM | POA: Diagnosis not present

## 2020-08-04 DIAGNOSIS — N183 Chronic kidney disease, stage 3 unspecified: Secondary | ICD-10-CM | POA: Diagnosis not present

## 2020-08-04 DIAGNOSIS — E7800 Pure hypercholesterolemia, unspecified: Secondary | ICD-10-CM | POA: Diagnosis not present

## 2020-08-04 DIAGNOSIS — H401390 Pigmentary glaucoma, unspecified eye, stage unspecified: Secondary | ICD-10-CM | POA: Diagnosis not present

## 2020-08-04 DIAGNOSIS — M5416 Radiculopathy, lumbar region: Secondary | ICD-10-CM | POA: Diagnosis not present

## 2020-08-04 DIAGNOSIS — L409 Psoriasis, unspecified: Secondary | ICD-10-CM | POA: Diagnosis not present

## 2020-08-04 DIAGNOSIS — I2510 Atherosclerotic heart disease of native coronary artery without angina pectoris: Secondary | ICD-10-CM | POA: Diagnosis not present

## 2020-08-04 DIAGNOSIS — U071 COVID-19: Secondary | ICD-10-CM | POA: Diagnosis not present

## 2020-08-04 DIAGNOSIS — I10 Essential (primary) hypertension: Secondary | ICD-10-CM | POA: Diagnosis not present

## 2020-08-04 DIAGNOSIS — M1990 Unspecified osteoarthritis, unspecified site: Secondary | ICD-10-CM | POA: Diagnosis not present

## 2020-08-04 DIAGNOSIS — E1122 Type 2 diabetes mellitus with diabetic chronic kidney disease: Secondary | ICD-10-CM | POA: Diagnosis not present

## 2020-08-10 ENCOUNTER — Other Ambulatory Visit: Payer: Self-pay

## 2020-08-10 ENCOUNTER — Other Ambulatory Visit: Payer: Self-pay | Admitting: Family Medicine

## 2020-08-10 ENCOUNTER — Ambulatory Visit
Admission: RE | Admit: 2020-08-10 | Discharge: 2020-08-10 | Disposition: A | Discharge status: Home / Self Care | Payer: Medicare Other | Source: Ambulatory Visit | Attending: Family Medicine | Admitting: Family Medicine

## 2020-08-10 DIAGNOSIS — M545 Low back pain, unspecified: Secondary | ICD-10-CM | POA: Diagnosis not present

## 2020-08-10 DIAGNOSIS — M5416 Radiculopathy, lumbar region: Secondary | ICD-10-CM

## 2020-08-22 ENCOUNTER — Other Ambulatory Visit: Payer: Self-pay | Admitting: Family Medicine

## 2020-08-22 DIAGNOSIS — M5416 Radiculopathy, lumbar region: Secondary | ICD-10-CM

## 2020-08-27 ENCOUNTER — Ambulatory Visit
Admission: RE | Admit: 2020-08-27 | Discharge: 2020-08-27 | Disposition: A | Discharge status: Home / Self Care | Payer: Medicare Other | Source: Ambulatory Visit | Attending: Family Medicine | Admitting: Family Medicine

## 2020-08-27 DIAGNOSIS — M48061 Spinal stenosis, lumbar region without neurogenic claudication: Secondary | ICD-10-CM | POA: Diagnosis not present

## 2020-08-27 DIAGNOSIS — M5416 Radiculopathy, lumbar region: Secondary | ICD-10-CM

## 2020-08-31 ENCOUNTER — Other Ambulatory Visit: Payer: Self-pay

## 2020-08-31 ENCOUNTER — Ambulatory Visit (INDEPENDENT_AMBULATORY_CARE_PROVIDER_SITE_OTHER): Payer: Medicare Other | Admitting: Internal Medicine

## 2020-08-31 ENCOUNTER — Encounter: Payer: Self-pay | Admitting: Internal Medicine

## 2020-08-31 VITALS — BP 116/52 | HR 77 | Ht 66.0 in | Wt 170.0 lb

## 2020-08-31 DIAGNOSIS — E1165 Type 2 diabetes mellitus with hyperglycemia: Secondary | ICD-10-CM

## 2020-08-31 DIAGNOSIS — I10 Essential (primary) hypertension: Secondary | ICD-10-CM | POA: Diagnosis not present

## 2020-08-31 DIAGNOSIS — E785 Hyperlipidemia, unspecified: Secondary | ICD-10-CM | POA: Diagnosis not present

## 2020-08-31 DIAGNOSIS — G4733 Obstructive sleep apnea (adult) (pediatric): Secondary | ICD-10-CM

## 2020-08-31 DIAGNOSIS — I2510 Atherosclerotic heart disease of native coronary artery without angina pectoris: Secondary | ICD-10-CM | POA: Diagnosis not present

## 2020-08-31 DIAGNOSIS — R079 Chest pain, unspecified: Secondary | ICD-10-CM

## 2020-08-31 NOTE — Progress Notes (Signed)
Cardiology Office Note:    Date:  08/31/2020   ID:  Tammy Pineda, DOB 06-05-1945, MRN [id number]  PCP:  Vernie Shanks, MD  Cardiologist:  Elouise Munroe, MD  Electrophysiologist:  None   Referring MD: Vernie Shanks, MD   Chief Complaint/Reason for Referral: Follow up  History of Present Illness:    Tammy Pineda is a 75 y.o. female with a history of arthritis, multiple prior surgeries with no issues with general anesthesia. CV history includes nonobstructive CAD by cath in 2011, performed for positive stress echo, by Dr. Eustace Quail. Minimal CAD with at most 30-40% LAD. Hx includes DM2 with A1C of 6.5% on oral agents, HTN well controlled on HCTZ and losartan, HLD well controlled on Crestor, and inflammatory arthritis on remicade. 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   L knee total arthroplasty performed 39/76/73, complicated by post operative DVT for which she took Xarelto for 3 mo, continues now on ASA 81 mg daily.  She has been experiencing some intermittent discomfort in her throat. She reports it has happened twice over the past 2 weeks. She was in the car for one of the episodes. She mentions these episodes last for 5 minutes and it goes away without any medication. She has a history of heartburn and GERD.   She reports that her diastolic blood pressure is always low such as 120/50. She has had some back and knee pain and has been diagnosed with spinal stenosis which limits some of her physical activities. She mentions she has also been under a lot of stress due to family health issues.  She denies shortness of breath, palpitations, lightheadedness, headaches, syncope, orthopnea, PND.   Past Medical History:  Diagnosis Date   Anxiety    Arthritis    Chronic kidney disease    Coronary artery disease    nonobstructive by cardiac catheterization 2011 with a 30% LAD lesion   Depression    Diabetes (HCC)    Type 2   GERD  (gastroesophageal reflux disease)    Glaucoma    Hypercholesteremia    Hypertension    NASH (nonalcoholic steatohepatitis)    NASH   Pneumonia    30 years ago   PVC (premature ventricular contraction)    Seasonal allergies    Sleep apnea    Spinal stenosis     Past Surgical History:  Procedure Laterality Date   ANTERIOR AND POSTERIOR VAGINAL REPAIR     APPENDECTOMY  1959   BLADDER SUSPENSION     BUNIONECTOMY     CARDIAC CATHETERIZATION  2011   clean cath   CARDIOVASCULAR STRESS TEST     CATARACT EXTRACTION W/ INTRAOCULAR LENS IMPLANT Right 2010   KNEE ARTHROSCOPY Left    NASAL RECONSTRUCTION  1976   SHOULDER ARTHROSCOPY Left    TOTAL ABDOMINAL HYSTERECTOMY  1976   TOTAL KNEE ARTHROPLASTY Left 01/03/2020   Procedure: TOTAL KNEE ARTHROPLASTY;  Surgeon: Gaynelle Arabian, MD;  Location: [facility];  Service: Orthopedics;  Laterality: Left;  70mn    Current Medications: Current Meds  Medication Sig   ALPRAZolam (XANAX) 0.5 MG tablet Take 0.25 mg by mouth at bedtime as needed for anxiety.   ASPIRIN 81 PO Take 81 mg by mouth daily.   brimonidine (ALPHAGAN) 0.2 % ophthalmic solution Place 1 drop into both eyes 2 (two) times daily.   Cholecalciferol (VITAMIN D) 2000 UNITS CAPS Take 2,000 Units by mouth daily.  Cranberry-Vitamin C-Vitamin E (CRANBERRY PLUS VITAMIN C) 4200-20-3 MG-MG-UNIT CAPS Take by mouth.   escitalopram (LEXAPRO) 10 MG tablet Take 10 mg by mouth at bedtime.    glipiZIDE (GLUCOTROL XL) 2.5 MG 24 hr tablet Take 1 tablet (2.5 mg total) by mouth daily before breakfast.   hydrochlorothiazide (MICROZIDE) 12.5 MG capsule Take 12.5 mg by mouth every evening.    inFLIXimab (REMICADE) 100 MG injection Inject 100 mg into the vein every 6 (six) weeks.   latanoprost (XALATAN) 0.005 % ophthalmic solution Place 1 drop into both eyes at bedtime.   losartan (COZAAR) 50 MG tablet Take 50 mg by mouth at bedtime.   meclizine (ANTIVERT) 12.5 MG tablet Take 1 tablet (12.5 mg total) by  mouth 3 (three) times daily as needed for dizziness.   methocarbamol (ROBAXIN) 500 MG tablet Take 1 tablet (500 mg total) by mouth every 6 (six) hours as needed for muscle spasms.   Misc Natural Products (TURMERIC CURCUMIN) CAPS Take 1 capsule by mouth daily. 1500 mg/capsule   omeprazole (PRILOSEC) 20 MG capsule Take 20 mg by mouth daily as needed (acid reflux/indigestion.). Pt takes as needed.   oxyCODONE (OXY IR/ROXICODONE) 5 MG immediate release tablet Take 1-2 tablets (5-10 mg total) by mouth every 6 (six) hours as needed for moderate pain or severe pain.   rosuvastatin (CRESTOR) 5 MG tablet Take 5 mg by mouth at bedtime.    timolol (TIMOPTIC) 0.5 % ophthalmic solution Place 1 drop into both eyes daily.   vitamin B-12 (CYANOCOBALAMIN) 1000 MCG tablet Take 1,000 mcg by mouth daily.   Current Facility-Administered Medications for the 08/31/20 encounter (Office Visit) with Elouise Munroe, MD  Medication   0.9 %  sodium chloride infusion     Allergies:   Metformin and related, Gabapentin, Sulfamethoxazole-trimethoprim, and Tape   Social History   Tobacco Use   Smoking status: Never   Smokeless tobacco: Never  Vaping Use   Vaping Use: Never used  Substance Use Topics   Alcohol use: No   Drug use: No     Family History: The patient's family history includes Colon cancer in her father; Heart attack in her maternal grandfather; Heart block in her brother; Heart disease in her father and mother; Hypertension in her mother; Stroke in her paternal grandmother.  ROS:   Please see the history of present illness.   (+) LE edema occasionally  (+)stress All other systems reviewed and are negative.  EKGs/Labs/Other Studies Reviewed:    The following studies were reviewed today:  Carotid Duplex Bilateral 12/02/2019:   Summary:  Right Carotid: Velocities in the right ICA are consistent with a 1-39%  stenosis.   Left Carotid: Velocities in the left ICA are consistent with a 1-39%   stenosis.   Vertebrals:  Bilateral vertebral arteries demonstrate antegrade flow.  Subclavians: Normal flow hemodynamics were seen in bilateral subclavian arteries.   EKG:  08/31/2020: NSR, rate 77 bpm non specific ST and T abnormalities. 12/10/2019:NSR, rate 63 bpm  Recent Labs: 12/22/2019: ALT 57 01/04/2020: BUN 21; Creatinine, Ser 0.92; Hemoglobin 10.8; Platelets 174; Potassium 3.9; Sodium 136  Recent Lipid Panel    Component Value Date/Time   CHOL 160 02/08/2019 1349   TRIG 257.0 (H) 02/08/2019 1349   HDL 49.90 02/08/2019 1349   CHOLHDL 3 02/08/2019 1349   VLDL 51.4 (H) 02/08/2019 1349   LDLCALC 70 12/22/2009 0858   LDLDIRECT 66.0 02/08/2019 1349    Physical Exam:    VS:  BP (!) 116/52 (  BP Location: Left Arm, Patient Position: Sitting, Cuff Size: Normal)   Pulse 77   Ht 5' 6"  (1.676 m)   Wt 170 lb (77.1 kg)   BMI 27.44 kg/m     Wt Readings from Last 5 Encounters:  07/27/20 172 lb (78 kg)  06/19/20 170 lb (77.1 kg)  04/18/20 172 lb 9.6 oz (78.3 kg)  01/03/20 173 lb (78.5 kg)  12/22/19 173 lb (78.5 kg)    Constitutional: No acute distress Eyes: sclera non-icteric, normal conjunctiva and lids ENMT: normal dentition, moist mucous membranes Cardiovascular: regular rhythm, normal rate, no murmurs. S1 and S2 normal. Radial pulses normal bilaterally. No jugular venous distention.  Respiratory: clear to auscultation bilaterally GI : normal bowel sounds, soft and nontender. No distention.   MSK: extremities warm, well perfused. No edema.  NEURO: grossly nonfocal exam, moves all extremities. PSYCH: alert and oriented x 3, normal mood and affect.   ASSESSMENT:    1. Essential hypertension   2. Chest pain, unspecified type   3. Atherosclerosis of native coronary artery of native heart without angina pectoris   4. Hyperlipidemia, unspecified hyperlipidemia type   5. Type 2 diabetes mellitus with hyperglycemia, without long-term current use of insulin (HCC)   6.  Obstructive sleep apnea     PLAN:    Throat pain as anginal equivalent, possible cardiac pain - recommended nuclear stress test to ensure no ischemia contributing to throat pain.  Atherosclerosis of native coronary artery of native heart without angina pectoris -mild CAD on cath in 2011. Will plan for nuc study as above. -continue statin and ASA 81 mg daily.  Essential hypertension -continue losartan, and hctz  Hyperlipidemia, unspecified hyperlipidemia type - continue Crestor, LDL 65  Type 2 diabetes mellitus with hyperglycemia, without long-term current use of insulin (HCC) - per pcp/endocrine. Most recent A1C 6.1%, improved.  Total time of encounter: 30 minutes total time of encounter, including 20 minutes spent in face-to-face patient care on the date of this encounter. This time includes coordination of care and counseling regarding above mentioned problem list. Remainder of non-face-to-face time involved reviewing chart documents/testing relevant to the patient encounter and documentation in the medical record. I have independently reviewed documentation from referring provider.   Cherlynn Kaiser, MD, [place]   Shared Decision Making/Informed Consent:   Shared Decision Making/Informed Consent The risks [chest pain, shortness of breath, cardiac arrhythmias, dizziness, blood pressure fluctuations, myocardial infarction, stroke/transient ischemic attack, nausea, vomiting, allergic reaction, radiation exposure, metallic taste sensation and life-threatening complications (estimated to be 1 in 10,000)], benefits (risk stratification, diagnosing coronary artery disease, treatment guidance) and alternatives of a nuclear stress test were discussed in detail with Tammy Pineda and she agrees to proceed.   Medication Adjustments/Labs and Tests Ordered: Current medicines are reviewed at length with the patient today.  Concerns regarding medicines are outlined above.    Orders Placed This Encounter  Procedures   Cardiac Stress Test: Informed Consent Details: Physician/Practitioner Attestation; Transcribe to consent form and obtain patient signature   MYOCARDIAL PERFUSION IMAGING   EKG 12-Lead     No orders of the defined types were placed in this encounter.   Patient Instructions  Medication Instructions:  No Changes In Medications at this time.  *If you need a refill on your cardiac medications before your next appointment, please call your pharmacy*  Testing/Procedures: Your physician has requested that you have a lexiscan myoview This will take place at [address]  Follow-Up: At [facility], you and your health needs are our priority.  As part of our continuing mission to provide you with exceptional heart care, we have created designated Provider Care Teams.  These Care Teams include your primary Cardiologist (physician) and Advanced Practice Providers (APPs -  Physician Assistants and Nurse Practitioners) who all work together to provide you with the care you need, when you need it.  Your next appointment:   To Be Determined After Stress Test   The format for your next appointment:   In Person  Provider:   Cherlynn Kaiser, MD    I,Zite Okoli,acting as a scribe for Elouise Munroe, MD.,have documented all relevant documentation on the behalf of Elouise Munroe, MD,as directed by  Elouise Munroe, MD while in the presence of Elouise Munroe, MD.   I, Elouise Munroe, MD, have reviewed all documentation for this visit. The documentation on 08/31/20 for the exam, diagnosis, procedures, and orders are all accurate and complete.

## 2020-08-31 NOTE — Patient Instructions (Addendum)
Medication Instructions:  No Changes In Medications at this time.  *If you need a refill on your cardiac medications before your next appointment, please call your pharmacy*  Testing/Procedures: Your physician has requested that you have a lexiscan myoview This will take place at [address]  Follow-Up: At [facility], you and your health needs are our priority.  As part of our continuing mission to provide you with exceptional heart care, we have created designated Provider Care Teams.  These Care Teams include your primary Cardiologist (physician) and Advanced Practice Providers (APPs -  Physician Assistants and Nurse Practitioners) who all work together to provide you with the care you need, when you need it.  Your next appointment:   To Be Determined After Stress Test   The format for your next appointment:   In Person  Provider:   Cherlynn Kaiser, MD

## 2020-09-01 DIAGNOSIS — N3020 Other chronic cystitis without hematuria: Secondary | ICD-10-CM | POA: Diagnosis not present

## 2020-09-01 DIAGNOSIS — N3941 Urge incontinence: Secondary | ICD-10-CM | POA: Diagnosis not present

## 2020-09-01 DIAGNOSIS — N281 Cyst of kidney, acquired: Secondary | ICD-10-CM | POA: Diagnosis not present

## 2020-09-06 DIAGNOSIS — Z79899 Other long term (current) drug therapy: Secondary | ICD-10-CM | POA: Diagnosis not present

## 2020-09-06 DIAGNOSIS — Z111 Encounter for screening for respiratory tuberculosis: Secondary | ICD-10-CM | POA: Diagnosis not present

## 2020-09-06 DIAGNOSIS — R5383 Other fatigue: Secondary | ICD-10-CM | POA: Diagnosis not present

## 2020-09-06 DIAGNOSIS — M0609 Rheumatoid arthritis without rheumatoid factor, multiple sites: Secondary | ICD-10-CM | POA: Diagnosis not present

## 2020-09-07 ENCOUNTER — Telehealth (HOSPITAL_COMMUNITY): Payer: Self-pay | Admitting: *Deleted

## 2020-09-07 NOTE — Telephone Encounter (Signed)
Patient given detailed instructions per Myocardial Perfusion Study Information Sheet for the test on 09/15/20 at 10:45. Patient notified to arrive 15 minutes early and that it is imperative to arrive on time for appointment to keep from having the test rescheduled.  If you need to cancel or reschedule your appointment, please call the office within 24 hours of your appointment. . Patient verbalized understanding.Tammy Pineda

## 2020-09-10 NOTE — Progress Notes (Signed)
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 Does want to try a different mask style.  09/11/20- 66 yo F never smoker coming to re-establish after last here 5 yrs ago-followed for OSA, complicated by HBP, CAD, GERD/cough, DM2, Hyperlipidemia, Covid infection May 2022, Rheumatoid Arthritis, CPAP auto 8-15/ [facility]                   AirSense 10 AutoSet machine Download- compliance 100%, AHI 1.1/ hr,     Body weight today-171 lbs Covid vax-3 Phizer -----CPAP- is working fine, pt states she does not take a nap without it.   ROS-see HPI Constitutional:   No-   weight loss, night sweats, fevers, chills, +fatigue, lassitude. HEENT:   No-  headaches, difficulty swallowing, tooth/dental problems, sore throat,  pain L jaw      No-  sneezing, itching, ear ache, +nasal congestion, post nasal drip,  CV:  No-   chest pain, orthopnea, PND, swelling in lower extremities, anasarca, dizziness, palpitations Resp:  shortness of breath with exertion or at rest.              productive cough,  non-productive cough,  No- coughing up of blood.              No-   change in color of mucus.  No- wheezing.   Skin: No-   rash or lesions. GI:  No-   heartburn, +indigestion, abdominal pain, nausea, vomiting, GU:  MS:  No-   joint pain or swelling. Neuro-     nothing unusual Psych:  No- change in mood or affect. No depression or anxiety.  No memory loss.  OBJ- Physical Exam General- Alert, Oriented, Affect-appropriate, Distress- none acute. Medium build, looks  well Skin- rash-none, lesions- none, excoriation- none Lymphadenopathy- none Head- atraumatic            Eyes- Gross vision intact, PERRLA, conjunctivae and secretions clear            Ears- Hearing, canals-normal.             Nose- Clear, + mild external deviation, +more narrow on the right,  No-mucus, polyps,  erosion, perforation             Throat- Mallampati II , mucosa clear , drainage- none, tonsils- atrophic. Teeth and                                 oropharynx seem ok. Neck- flexible , trachea midline, no stridor , thyroid nl, carotid no bruit Chest - symmetrical excursion , unlabored           Heart/CV- RRR , no murmur , no gallop  , no rub, nl s1 s2                           - JVD- none , edema- none, stasis changes- none, varices- none           Lung- clear, unlabored, wheeze- none, cough-none , dullness-none, rub- none  Chest wall-  Abd-  Br/ Gen/ Rectal- Not done, not indicated Extrem- cyanosis- none, clubbing, none, atrophy- none, strength- nl Neuro- grossly intact to observation

## 2020-09-11 ENCOUNTER — Encounter: Payer: Self-pay | Admitting: Internal Medicine

## 2020-09-11 ENCOUNTER — Ambulatory Visit (INDEPENDENT_AMBULATORY_CARE_PROVIDER_SITE_OTHER): Payer: Medicare Other | Admitting: Internal Medicine

## 2020-09-11 ENCOUNTER — Other Ambulatory Visit: Payer: Self-pay

## 2020-09-11 VITALS — BP 108/58 | HR 78 | Temp 97.5°F | Ht 66.0 in | Wt 171.8 lb

## 2020-09-11 DIAGNOSIS — I2510 Atherosclerotic heart disease of native coronary artery without angina pectoris: Secondary | ICD-10-CM

## 2020-09-11 DIAGNOSIS — G4733 Obstructive sleep apnea (adult) (pediatric): Secondary | ICD-10-CM

## 2020-09-11 DIAGNOSIS — M069 Rheumatoid arthritis, unspecified: Secondary | ICD-10-CM | POA: Diagnosis not present

## 2020-09-11 NOTE — Patient Instructions (Signed)
Order- DME [facility]- please replace old CPAP machine auto 8-15, mask of choice, humidifier, supplies, AirView/ card  Please call if we can help

## 2020-09-13 DIAGNOSIS — M1990 Unspecified osteoarthritis, unspecified site: Secondary | ICD-10-CM | POA: Diagnosis not present

## 2020-09-13 DIAGNOSIS — E7800 Pure hypercholesterolemia, unspecified: Secondary | ICD-10-CM | POA: Diagnosis not present

## 2020-09-13 DIAGNOSIS — M47816 Spondylosis without myelopathy or radiculopathy, lumbar region: Secondary | ICD-10-CM | POA: Diagnosis not present

## 2020-09-13 DIAGNOSIS — M48062 Spinal stenosis, lumbar region with neurogenic claudication: Secondary | ICD-10-CM | POA: Diagnosis not present

## 2020-09-13 DIAGNOSIS — K219 Gastro-esophageal reflux disease without esophagitis: Secondary | ICD-10-CM | POA: Diagnosis not present

## 2020-09-13 DIAGNOSIS — N183 Chronic kidney disease, stage 3 unspecified: Secondary | ICD-10-CM | POA: Diagnosis not present

## 2020-09-13 DIAGNOSIS — I2510 Atherosclerotic heart disease of native coronary artery without angina pectoris: Secondary | ICD-10-CM | POA: Diagnosis not present

## 2020-09-13 DIAGNOSIS — E1122 Type 2 diabetes mellitus with diabetic chronic kidney disease: Secondary | ICD-10-CM | POA: Diagnosis not present

## 2020-09-13 DIAGNOSIS — H401390 Pigmentary glaucoma, unspecified eye, stage unspecified: Secondary | ICD-10-CM | POA: Diagnosis not present

## 2020-09-13 DIAGNOSIS — M4316 Spondylolisthesis, lumbar region: Secondary | ICD-10-CM | POA: Diagnosis not present

## 2020-09-13 DIAGNOSIS — I10 Essential (primary) hypertension: Secondary | ICD-10-CM | POA: Diagnosis not present

## 2020-09-14 ENCOUNTER — Telehealth: Payer: Self-pay | Admitting: Internal Medicine

## 2020-09-14 NOTE — Telephone Encounter (Signed)
New, Carroll Kinds, Rachell Cipro, [place]; [place], [place].  Order received, but I need some clarification. It looks like this is a new machine request but also states this is not a new start. In the note from MD it say patient to get new cpap machine but it looks like patient received a pap unit on 11/04/2017 .   I have processed the supply order in the meantime: Please advise.   Thank you,   Demetrius Charity

## 2020-09-15 ENCOUNTER — Ambulatory Visit (HOSPITAL_COMMUNITY): Payer: Medicare Other | Attending: Cardiology

## 2020-09-15 ENCOUNTER — Other Ambulatory Visit: Payer: Self-pay

## 2020-09-15 DIAGNOSIS — R079 Chest pain, unspecified: Secondary | ICD-10-CM | POA: Diagnosis not present

## 2020-09-15 LAB — MYOCARDIAL PERFUSION IMAGING
LV dias vol: 62 mL (ref 46–106)
LV sys vol: 21 mL
Peak HR: 82 {beats}/min
Rest HR: 65 {beats}/min
SDS: 1
SRS: 0
SSS: 1
TID: 1.07

## 2020-09-15 MED ORDER — TECHNETIUM TC 99M TETROFOSMIN IV KIT
9.7000 | PACK | Freq: Once | INTRAVENOUS | Status: AC | PRN
  Administered 2020-09-15: 9.7 via INTRAVENOUS
  Filled 2020-09-15: qty 10

## 2020-09-15 MED ORDER — TECHNETIUM TC 99M TETROFOSMIN IV KIT
30.9000 | PACK | Freq: Once | INTRAVENOUS | Status: AC | PRN
  Filled 2020-09-15: qty 31

## 2020-09-15 MED ORDER — REGADENOSON 0.4 MG/5ML IV SOLN: 0.4000 mg | Freq: Once | INTRAVENOUS | Status: AC

## 2020-09-15 NOTE — Telephone Encounter (Signed)
LMTCB for the pt 

## 2020-09-19 NOTE — Telephone Encounter (Signed)
Dr. Annamaria Boots documented that the pt needs a replacement CPAP. The order indicates the pt needs a new cpap but already has one in the home. Message sent to [facility] to see what is needed for [facility].

## 2020-09-20 DIAGNOSIS — M48061 Spinal stenosis, lumbar region without neurogenic claudication: Secondary | ICD-10-CM | POA: Diagnosis not present

## 2020-09-20 DIAGNOSIS — M5416 Radiculopathy, lumbar region: Secondary | ICD-10-CM | POA: Diagnosis not present

## 2020-09-20 DIAGNOSIS — M4316 Spondylolisthesis, lumbar region: Secondary | ICD-10-CM | POA: Diagnosis not present

## 2020-09-22 ENCOUNTER — Telehealth: Payer: Self-pay | Admitting: Internal Medicine

## 2020-09-22 NOTE — Telephone Encounter (Signed)
Spoke with patient regarding the following results. Patient made aware and patient verbalized understanding.    Tammy Munroe, MD  09/15/2020  4:54 PM EDT     Normal stress test.

## 2020-09-22 NOTE — Telephone Encounter (Signed)
Patient returning call for stress test results.

## 2020-09-25 DIAGNOSIS — M4316 Spondylolisthesis, lumbar region: Secondary | ICD-10-CM | POA: Diagnosis not present

## 2020-09-25 DIAGNOSIS — M5416 Radiculopathy, lumbar region: Secondary | ICD-10-CM | POA: Diagnosis not present

## 2020-09-25 DIAGNOSIS — M48061 Spinal stenosis, lumbar region without neurogenic claudication: Secondary | ICD-10-CM | POA: Diagnosis not present

## 2020-09-26 DIAGNOSIS — M48062 Spinal stenosis, lumbar region with neurogenic claudication: Secondary | ICD-10-CM | POA: Diagnosis not present

## 2020-09-27 DIAGNOSIS — M0609 Rheumatoid arthritis without rheumatoid factor, multiple sites: Secondary | ICD-10-CM | POA: Diagnosis not present

## 2020-09-27 DIAGNOSIS — R5383 Other fatigue: Secondary | ICD-10-CM | POA: Diagnosis not present

## 2020-09-27 DIAGNOSIS — Z79899 Other long term (current) drug therapy: Secondary | ICD-10-CM | POA: Diagnosis not present

## 2020-09-27 DIAGNOSIS — M0579 Rheumatoid arthritis with rheumatoid factor of multiple sites without organ or systems involvement: Secondary | ICD-10-CM | POA: Diagnosis not present

## 2020-09-28 DIAGNOSIS — M5416 Radiculopathy, lumbar region: Secondary | ICD-10-CM | POA: Diagnosis not present

## 2020-09-28 DIAGNOSIS — M48061 Spinal stenosis, lumbar region without neurogenic claudication: Secondary | ICD-10-CM | POA: Diagnosis not present

## 2020-09-28 DIAGNOSIS — M4316 Spondylolisthesis, lumbar region: Secondary | ICD-10-CM | POA: Diagnosis not present

## 2020-09-29 NOTE — Telephone Encounter (Signed)
Found a response to the community message from [place] in [employer].   "Hello Tammy Pineda,   Nothing is needed, the order was just a little confusing as the note on the message said supply order and the order looked like a new unit order.  I will go ahead and process this but it looks like the patient may have received a new unit in 2019 based on the notes I see. I will let the verification team review to verify. I will let you know if something comes up.   Thank you,   Leroy [place]"  Will close encounter since nothing is needed.

## 2020-10-02 DIAGNOSIS — M4316 Spondylolisthesis, lumbar region: Secondary | ICD-10-CM | POA: Diagnosis not present

## 2020-10-02 DIAGNOSIS — M48061 Spinal stenosis, lumbar region without neurogenic claudication: Secondary | ICD-10-CM | POA: Diagnosis not present

## 2020-10-02 DIAGNOSIS — M5416 Radiculopathy, lumbar region: Secondary | ICD-10-CM | POA: Diagnosis not present

## 2020-10-05 DIAGNOSIS — I2510 Atherosclerotic heart disease of native coronary artery without angina pectoris: Secondary | ICD-10-CM | POA: Diagnosis not present

## 2020-10-05 DIAGNOSIS — M1990 Unspecified osteoarthritis, unspecified site: Secondary | ICD-10-CM | POA: Diagnosis not present

## 2020-10-05 DIAGNOSIS — K219 Gastro-esophageal reflux disease without esophagitis: Secondary | ICD-10-CM | POA: Diagnosis not present

## 2020-10-05 DIAGNOSIS — U071 COVID-19: Secondary | ICD-10-CM | POA: Diagnosis not present

## 2020-10-05 DIAGNOSIS — M48061 Spinal stenosis, lumbar region without neurogenic claudication: Secondary | ICD-10-CM | POA: Diagnosis not present

## 2020-10-05 DIAGNOSIS — N183 Chronic kidney disease, stage 3 unspecified: Secondary | ICD-10-CM | POA: Diagnosis not present

## 2020-10-05 DIAGNOSIS — M5416 Radiculopathy, lumbar region: Secondary | ICD-10-CM | POA: Diagnosis not present

## 2020-10-05 DIAGNOSIS — H401390 Pigmentary glaucoma, unspecified eye, stage unspecified: Secondary | ICD-10-CM | POA: Diagnosis not present

## 2020-10-05 DIAGNOSIS — I10 Essential (primary) hypertension: Secondary | ICD-10-CM | POA: Diagnosis not present

## 2020-10-05 DIAGNOSIS — N281 Cyst of kidney, acquired: Secondary | ICD-10-CM | POA: Diagnosis not present

## 2020-10-05 DIAGNOSIS — L409 Psoriasis, unspecified: Secondary | ICD-10-CM | POA: Diagnosis not present

## 2020-10-05 DIAGNOSIS — M4316 Spondylolisthesis, lumbar region: Secondary | ICD-10-CM | POA: Diagnosis not present

## 2020-10-05 DIAGNOSIS — Z8719 Personal history of other diseases of the digestive system: Secondary | ICD-10-CM | POA: Diagnosis not present

## 2020-10-05 DIAGNOSIS — E7800 Pure hypercholesterolemia, unspecified: Secondary | ICD-10-CM | POA: Diagnosis not present

## 2020-10-05 DIAGNOSIS — E1122 Type 2 diabetes mellitus with diabetic chronic kidney disease: Secondary | ICD-10-CM | POA: Diagnosis not present

## 2020-10-10 DIAGNOSIS — M4316 Spondylolisthesis, lumbar region: Secondary | ICD-10-CM | POA: Diagnosis not present

## 2020-10-10 DIAGNOSIS — M48061 Spinal stenosis, lumbar region without neurogenic claudication: Secondary | ICD-10-CM | POA: Diagnosis not present

## 2020-10-10 DIAGNOSIS — M5416 Radiculopathy, lumbar region: Secondary | ICD-10-CM | POA: Diagnosis not present

## 2020-10-12 DIAGNOSIS — M48061 Spinal stenosis, lumbar region without neurogenic claudication: Secondary | ICD-10-CM | POA: Diagnosis not present

## 2020-10-12 DIAGNOSIS — M5416 Radiculopathy, lumbar region: Secondary | ICD-10-CM | POA: Diagnosis not present

## 2020-10-12 DIAGNOSIS — M4316 Spondylolisthesis, lumbar region: Secondary | ICD-10-CM | POA: Diagnosis not present

## 2020-10-17 DIAGNOSIS — M48061 Spinal stenosis, lumbar region without neurogenic claudication: Secondary | ICD-10-CM | POA: Diagnosis not present

## 2020-10-17 DIAGNOSIS — M5416 Radiculopathy, lumbar region: Secondary | ICD-10-CM | POA: Diagnosis not present

## 2020-10-17 DIAGNOSIS — M4316 Spondylolisthesis, lumbar region: Secondary | ICD-10-CM | POA: Diagnosis not present

## 2020-10-18 DIAGNOSIS — M0609 Rheumatoid arthritis without rheumatoid factor, multiple sites: Secondary | ICD-10-CM | POA: Diagnosis not present

## 2020-10-18 DIAGNOSIS — Z79899 Other long term (current) drug therapy: Secondary | ICD-10-CM | POA: Diagnosis not present

## 2020-10-19 DIAGNOSIS — M5416 Radiculopathy, lumbar region: Secondary | ICD-10-CM | POA: Diagnosis not present

## 2020-10-19 DIAGNOSIS — M48061 Spinal stenosis, lumbar region without neurogenic claudication: Secondary | ICD-10-CM | POA: Diagnosis not present

## 2020-10-19 DIAGNOSIS — M4316 Spondylolisthesis, lumbar region: Secondary | ICD-10-CM | POA: Diagnosis not present

## 2020-10-24 DIAGNOSIS — M48061 Spinal stenosis, lumbar region without neurogenic claudication: Secondary | ICD-10-CM | POA: Diagnosis not present

## 2020-10-24 DIAGNOSIS — M5416 Radiculopathy, lumbar region: Secondary | ICD-10-CM | POA: Diagnosis not present

## 2020-10-24 DIAGNOSIS — M4316 Spondylolisthesis, lumbar region: Secondary | ICD-10-CM | POA: Diagnosis not present

## 2020-10-26 DIAGNOSIS — M5416 Radiculopathy, lumbar region: Secondary | ICD-10-CM | POA: Diagnosis not present

## 2020-10-26 DIAGNOSIS — M48061 Spinal stenosis, lumbar region without neurogenic claudication: Secondary | ICD-10-CM | POA: Diagnosis not present

## 2020-10-26 DIAGNOSIS — M4316 Spondylolisthesis, lumbar region: Secondary | ICD-10-CM | POA: Diagnosis not present

## 2020-10-31 DIAGNOSIS — M4316 Spondylolisthesis, lumbar region: Secondary | ICD-10-CM | POA: Diagnosis not present

## 2020-10-31 DIAGNOSIS — M48061 Spinal stenosis, lumbar region without neurogenic claudication: Secondary | ICD-10-CM | POA: Diagnosis not present

## 2020-10-31 DIAGNOSIS — M5416 Radiculopathy, lumbar region: Secondary | ICD-10-CM | POA: Diagnosis not present

## 2020-11-01 DIAGNOSIS — M79661 Pain in right lower leg: Secondary | ICD-10-CM | POA: Diagnosis not present

## 2020-11-01 DIAGNOSIS — Z86718 Personal history of other venous thrombosis and embolism: Secondary | ICD-10-CM | POA: Diagnosis not present

## 2020-11-02 ENCOUNTER — Ambulatory Visit (HOSPITAL_COMMUNITY)
Admission: RE | Admit: 2020-11-02 | Discharge: 2020-11-02 | Disposition: A | Discharge status: Home / Self Care | Payer: Medicare Other | Source: Ambulatory Visit | Attending: Family Medicine | Admitting: Family Medicine

## 2020-11-02 ENCOUNTER — Other Ambulatory Visit (HOSPITAL_COMMUNITY): Payer: Self-pay | Admitting: Family Medicine

## 2020-11-02 ENCOUNTER — Other Ambulatory Visit: Payer: Self-pay

## 2020-11-02 DIAGNOSIS — M79661 Pain in right lower leg: Secondary | ICD-10-CM

## 2020-11-09 DIAGNOSIS — E7800 Pure hypercholesterolemia, unspecified: Secondary | ICD-10-CM | POA: Diagnosis not present

## 2020-11-09 DIAGNOSIS — I10 Essential (primary) hypertension: Secondary | ICD-10-CM | POA: Diagnosis not present

## 2020-11-09 DIAGNOSIS — N183 Chronic kidney disease, stage 3 unspecified: Secondary | ICD-10-CM | POA: Diagnosis not present

## 2020-11-09 DIAGNOSIS — I2510 Atherosclerotic heart disease of native coronary artery without angina pectoris: Secondary | ICD-10-CM | POA: Diagnosis not present

## 2020-11-09 DIAGNOSIS — G43009 Migraine without aura, not intractable, without status migrainosus: Secondary | ICD-10-CM | POA: Diagnosis not present

## 2020-11-09 DIAGNOSIS — M1990 Unspecified osteoarthritis, unspecified site: Secondary | ICD-10-CM | POA: Diagnosis not present

## 2020-11-09 DIAGNOSIS — K219 Gastro-esophageal reflux disease without esophagitis: Secondary | ICD-10-CM | POA: Diagnosis not present

## 2020-11-09 DIAGNOSIS — E1122 Type 2 diabetes mellitus with diabetic chronic kidney disease: Secondary | ICD-10-CM | POA: Diagnosis not present

## 2020-11-14 DIAGNOSIS — E119 Type 2 diabetes mellitus without complications: Secondary | ICD-10-CM | POA: Diagnosis not present

## 2020-11-14 DIAGNOSIS — H16223 Keratoconjunctivitis sicca, not specified as Sjogren's, bilateral: Secondary | ICD-10-CM | POA: Diagnosis not present

## 2020-11-14 DIAGNOSIS — H401222 Low-tension glaucoma, left eye, moderate stage: Secondary | ICD-10-CM | POA: Diagnosis not present

## 2020-11-14 DIAGNOSIS — Z961 Presence of intraocular lens: Secondary | ICD-10-CM | POA: Diagnosis not present

## 2020-11-14 DIAGNOSIS — H401213 Low-tension glaucoma, right eye, severe stage: Secondary | ICD-10-CM | POA: Diagnosis not present

## 2020-11-20 DIAGNOSIS — M4316 Spondylolisthesis, lumbar region: Secondary | ICD-10-CM | POA: Diagnosis not present

## 2020-11-28 DIAGNOSIS — M150 Primary generalized (osteo)arthritis: Secondary | ICD-10-CM | POA: Diagnosis not present

## 2020-11-28 DIAGNOSIS — E663 Overweight: Secondary | ICD-10-CM | POA: Diagnosis not present

## 2020-11-28 DIAGNOSIS — Z79899 Other long term (current) drug therapy: Secondary | ICD-10-CM | POA: Diagnosis not present

## 2020-11-28 DIAGNOSIS — M2550 Pain in unspecified joint: Secondary | ICD-10-CM | POA: Diagnosis not present

## 2020-11-28 DIAGNOSIS — M0609 Rheumatoid arthritis without rheumatoid factor, multiple sites: Secondary | ICD-10-CM | POA: Diagnosis not present

## 2020-11-28 DIAGNOSIS — M48061 Spinal stenosis, lumbar region without neurogenic claudication: Secondary | ICD-10-CM | POA: Diagnosis not present

## 2020-11-28 DIAGNOSIS — Z6828 Body mass index (BMI) 28.0-28.9, adult: Secondary | ICD-10-CM | POA: Diagnosis not present

## 2020-11-28 DIAGNOSIS — N951 Menopausal and female climacteric states: Secondary | ICD-10-CM | POA: Diagnosis not present

## 2020-11-30 ENCOUNTER — Ambulatory Visit (INDEPENDENT_AMBULATORY_CARE_PROVIDER_SITE_OTHER): Payer: Medicare Other | Admitting: Internal Medicine

## 2020-11-30 ENCOUNTER — Encounter: Payer: Self-pay | Admitting: Internal Medicine

## 2020-11-30 ENCOUNTER — Other Ambulatory Visit: Payer: Self-pay

## 2020-11-30 VITALS — BP 140/72 | HR 71 | Ht 66.0 in | Wt 174.6 lb

## 2020-11-30 DIAGNOSIS — E663 Overweight: Secondary | ICD-10-CM

## 2020-11-30 DIAGNOSIS — E785 Hyperlipidemia, unspecified: Secondary | ICD-10-CM

## 2020-11-30 DIAGNOSIS — Z23 Encounter for immunization: Secondary | ICD-10-CM | POA: Diagnosis not present

## 2020-11-30 DIAGNOSIS — I2510 Atherosclerotic heart disease of native coronary artery without angina pectoris: Secondary | ICD-10-CM

## 2020-11-30 DIAGNOSIS — E1165 Type 2 diabetes mellitus with hyperglycemia: Secondary | ICD-10-CM | POA: Diagnosis not present

## 2020-11-30 LAB — LIPID PANEL
Cholesterol: 139 mg/dL (ref 0–200)
HDL: 42.7 mg/dL (ref >39.00)
NonHDL: 96.71
Total CHOL/HDL Ratio: 3
Triglycerides: 278 mg/dL — ABNORMAL HIGH (ref 0.0–149.0)
VLDL: 55.6 mg/dL — ABNORMAL HIGH (ref 0.0–40.0)

## 2020-11-30 LAB — POCT GLYCOSYLATED HEMOGLOBIN (HGB A1C): Hemoglobin A1C: 6.5 % — AB (ref 4.0–5.6)

## 2020-11-30 LAB — LDL CHOLESTEROL, DIRECT: Direct LDL: 59 mg/dL

## 2020-11-30 MED ORDER — GLIPIZIDE ER 2.5 MG PO TB24: 5.0000 mg | ORAL_TABLET | Freq: Every day | ORAL | 3 refills | Status: DC

## 2020-11-30 NOTE — Progress Notes (Addendum)
Patient ID: Tammy Pineda, female   DOB: 03/25/1945, 75 y.o.   MRN: [id number]   This visit occurred during the SARS-CoV-2 public health emergency.  Safety protocols were in place, including screening questions prior to the visit, additional usage of staff PPE, and extensive cleaning of exam room while observing appropriate contact time as indicated for disinfecting solutions.    HPI: Tammy Pineda is a 75 y.o.-year-old female, initially referred by her PCP, Dr. Drema Dallas, returning for follow-up for DM2, dx "several years ago", non-insulin-dependent, uncontrolled, with complications (CAD, CKD stage III, NASH). Tammy Pineda, her husband, is also my pt. Last visit 4 mo ago.  Interim history: She had left TKR 01/03/2020.  At last visit she was still having a lot of pain after the surgery.  She also had a DVT in the L leg - on Xarelto x 3 mo. she also needs to have the right TKR performed but she would like to postpone this for as long as possible. She has back pain and  is on Meloxicam, Hydrocodone.  She is getting steroid injections in back.  Sugars increased up to 200s after last injection. She continues to have a lot of stress at home with her husband having multiple medical problems and also taking care of her sister who has cognitive impairment. No increased urination, blurry vision, nausea, chest pain.  Reviewed HbA1c levels: Lab Results  Component Value Date   HGBA1C 6.1 (A) 07/27/2020   HGBA1C 6.1 (A) 04/18/2020   HGBA1C 6.7 (A) 08/11/2019   HGBA1C 6.9 (A) 05/03/2019   HGBA1C 6.4 (A) 02/08/2019   HGBA1C 5.9 10/08/2018   HGBA1C 6.2 (A) 08/04/2018  11/25/2019: HbA1c 6.5% 02/26/2018: HbA1c 6.5% 09/20/2017: HbA1c 7.3% 04/18/2017: HbA1c 7.6%  Currently on: - Glipizide XL 2.5 mg before breakfast and 2.5 mg before dinner  Previously on: - Metformin 1000 mg 2x a day, with meals -added 11/2017 - had significant diarrhea >> metformin ER 1000 mg 2x a day which she was tolerating well, however,  she then started to have severe diarrhea >> she stopped in 09/2018. We tried [place] in the past but this was too expensive.  Pt checks her sugars once a day: - am: 110-139, 142 >> 88-142, 150 >> 121-148, 153 - 2h after b'fast:n/c >> 66, 150-241 >> n/c - before lunch: 68 >> n/c >> 125 >> n/c >> 122, 130 - 2h after lunch: 119, 150 >> n/c >> 164 >> n/c - before dinner:  n/c >> 57, 131, 138, 140 >> 96, 169 - 2h after dinner: 125, 153 >> n/c >> 125 >> 162 - bedtime: n/c >> 113, 117 >> n/c >> 183 - nighttime: n/c >> 98, 108 Lowest sugar was 52 before dinner (ate only oatmeal that day) >> 96; it is unclear at which level she has hypoglycemia awareness. Highest sugar was 162 >> 224 (steroids).  Glucometer: One Touch verio  Pt's meals are: - Breakfast: eggs, cereal + fruit, egg waffles and sugar-free syrup  - Lunch: salad, grilled chicken - Dinner: baked meat, veggies, starch  - Snacks: 1-2x - at bedtime - granola  In the past, she was exercising at [employer] and also dancing 2-3 times a week.  However, she stopped during the coronavirus pandemic.  [facility]  -+ CKD stage III, last BUN/creatinine:  10/03/2020: 23/0.96, GR 61, Glu 181 Lab Results  Component Value Date   BUN 21 01/04/2020   BUN 25 (H) 12/22/2019   CREATININE 0.92  01/04/2020   CREATININE 0.85 12/22/2019  On losartan 50.  -+ HL; last set of lipids: 11/25/2019: 151/268/43/65 Lab Results  Component Value Date   CHOL 160 02/08/2019   HDL 49.90 02/08/2019   LDLCALC 70 12/22/2009   LDLDIRECT 66.0 02/08/2019   TRIG 257.0 (H) 02/08/2019   CHOLHDL 3 02/08/2019  On Crestor 5.  - last eye exam was in 2022: + Glaucoma, no DR. Dr. Katy Fitch.  She has a history of cataract surgery.  - no numbness and tingling in her feet.  Pt has FH of DM in father and brother.   She also has a history of HTN-on Lasix as needed, IBS with diarrhea, GERD, vaginal prolapse - h/o surgery.  She also has NASH with stage 2  fibrosis-currently in a new study.  She was previously in another study that was using a drug which helped her significantly (HbA1c dropped to 6.2%), however, the study was stopped and the drug is not available.   She sees Dr. Trudie Reed for RA.  On Remicade.  Vitamin B12 (11/25/2019): 504, normal.  ROS: + see HPI + Joint aches  I reviewed pt's medications, allergies, PMH, social hx, family hx, and changes were documented in the history of present illness. Otherwise, unchanged from my initial visit note.  Past Medical History:  Diagnosis Date   Anxiety    Arthritis    Chronic kidney disease    Coronary artery disease    nonobstructive by cardiac catheterization 2011 with a 30% LAD lesion   Depression    Diabetes (HCC)    Type 2   GERD (gastroesophageal reflux disease)    Glaucoma    Hypercholesteremia    Hypertension    NASH (nonalcoholic steatohepatitis)    NASH   Pneumonia    30 years ago   PVC (premature ventricular contraction)    Seasonal allergies    Sleep apnea    Spinal stenosis    Past Surgical History:  Procedure Laterality Date   ANTERIOR AND POSTERIOR VAGINAL REPAIR     APPENDECTOMY  1959   BLADDER SUSPENSION     BUNIONECTOMY     CARDIAC CATHETERIZATION  2011   clean cath   CARDIOVASCULAR STRESS TEST     CATARACT EXTRACTION W/ INTRAOCULAR LENS IMPLANT Right 2010   KNEE ARTHROSCOPY Left    NASAL RECONSTRUCTION  1976   SHOULDER ARTHROSCOPY Left    TOTAL ABDOMINAL HYSTERECTOMY  1976   TOTAL KNEE ARTHROPLASTY Left 01/03/2020   Procedure: TOTAL KNEE ARTHROPLASTY;  Surgeon: Gaynelle Arabian, MD;  Location: [facility];  Service: Orthopedics;  Laterality: Left;  50mn   Social History   Socioeconomic History   Marital status: Married    Spouse name: Not on file   Number of children: 1   Years of education: Not on file   Highest education level: Not on file  Occupational History   Occupation: RTherapist, sports- retired    [employer] farm [employer]     Comment: [employer]  Tobacco Use   Smoking status: Never Smoker   Smokeless tobacco: Never Used  Substance and Sexual Activity   Alcohol use: No   Drug use: No   Current Outpatient Medications on File Prior to Visit  Medication Sig Dispense Refill   ALPRAZolam (XANAX) 0.5 MG tablet Take 0.25 mg by mouth at bedtime as needed for anxiety.     ASPIRIN 81 PO Take 81 mg by mouth daily.     brimonidine (ALPHAGAN) 0.2 % ophthalmic solution  Place 1 drop into both eyes 2 (two) times daily.     Cholecalciferol (VITAMIN D) 2000 UNITS CAPS Take 2,000 Units by mouth daily.      Cranberry-Vitamin C-Vitamin E (CRANBERRY PLUS VITAMIN C) 4200-20-3 MG-MG-UNIT CAPS Take by mouth.     escitalopram (LEXAPRO) 10 MG tablet Take 10 mg by mouth at bedtime.      glipiZIDE (GLUCOTROL XL) 2.5 MG 24 hr tablet Take 1 tablet (2.5 mg total) by mouth daily before breakfast. 90 tablet 3   hydrochlorothiazide (MICROZIDE) 12.5 MG capsule Take 12.5 mg by mouth every evening.      inFLIXimab (REMICADE) 100 MG injection Inject 100 mg into the vein every 6 (six) weeks.     latanoprost (XALATAN) 0.005 % ophthalmic solution Place 1 drop into both eyes at bedtime.     losartan (COZAAR) 50 MG tablet Take 50 mg by mouth at bedtime.     meclizine (ANTIVERT) 12.5 MG tablet Take 1 tablet (12.5 mg total) by mouth 3 (three) times daily as needed for dizziness. 21 tablet 0   methocarbamol (ROBAXIN) 500 MG tablet Take 1 tablet (500 mg total) by mouth every 6 (six) hours as needed for muscle spasms. 40 tablet 0   Misc Natural Products (TURMERIC CURCUMIN) CAPS Take 1 capsule by mouth daily. 1500 mg/capsule     omeprazole (PRILOSEC) 20 MG capsule Take 20 mg by mouth daily as needed (acid reflux/indigestion.). Pt takes as needed.     oxyCODONE (OXY IR/ROXICODONE) 5 MG immediate release tablet Take 1-2 tablets (5-10 mg total) by mouth every 6 (six) hours as needed for moderate pain or severe pain. 42 tablet 0   rosuvastatin (CRESTOR) 5 MG tablet Take 5 mg by mouth  at bedtime.      timolol (TIMOPTIC) 0.5 % ophthalmic solution Place 1 drop into both eyes daily.     vitamin B-12 (CYANOCOBALAMIN) 1000 MCG tablet Take 1,000 mcg by mouth daily.     Current Facility-Administered Medications on File Prior to Visit  Medication Dose Route Frequency Provider Last Rate Last Admin   0.9 %  sodium chloride infusion   Intravenous PRN Kathrine Haddock, NP       regadenoson (LEXISCAN) injection SOLN 0.4 mg  0.4 mg Intravenous Once Buford Dresser, MD       technetium tetrofosmin (TC-MYOVIEW) injection 32.9 millicurie  51.8 millicurie Intravenous Once PRN Buford Dresser, MD       Allergies  Allergen Reactions   Metformin And Related Diarrhea   Gabapentin Palpitations   Sulfamethoxazole-Trimethoprim Rash   Tape Rash   Family History  Problem Relation Age of Onset   Heart disease Father        open heart surgery at 26   Colon cancer Father    Heart disease Mother        open heart surgery at age 33   Hypertension Mother    Heart attack Maternal Grandfather    Stroke Paternal Grandmother    Heart block Brother     PE: BP 140/72 (BP Location: Left Arm, Patient Position: Sitting, Cuff Size: Normal)   Pulse 71   Ht 5' 6"  (1.676 m)   Wt 174 lb 9.6 oz (79.2 kg)   SpO2 96%   BMI 28.18 kg/m   Wt Readings from Last 3 Encounters:  11/30/20 174 lb 9.6 oz (79.2 kg)  09/15/20 170 lb (77.1 kg)  09/11/20 171 lb 12.8 oz (77.9 kg)   Constitutional: overweight, in NAD Eyes: PERRLA, EOMI, no exophthalmos  ENT: moist mucous membranes, no thyromegaly, no cervical lymphadenopathy Cardiovascular: RRR, No MRG Respiratory: CTA B Gastrointestinal: abdomen soft, NT, ND, BS+ Musculoskeletal: no deformities, strength intact in all 4 Skin: moist, warm, no rashes Neurological: no tremor with outstretched hands, DTR normal in all 4  ASSESSMENT: 1. DM2, non-insulin-dependent, uncontrolled, with long-term complications - CAD - CKD stage III - NASH  2.  HL  3. Overweight  PLAN:  1. Patient with longstanding, previously uncontrolled, type 2 diabetes, with improved control after initially starting [place], however, this was not affordable so she had to come off.  She could not tolerate metformin ER due to diarrhea.  Therefore, for now, she is only on low-dose glipizide extended release.  At last visit, sugars were mostly at goal with only 1 hypoglycemic exception, at 52, in the late afternoon, after delayed lunch and having a small breakfast.  Otherwise, sugars were at goal with few very mild hyperglycemic exceptions.  We continued the same regimen.  HbA1c at that time was excellent, at 6.1%. -At today's visit, sugars are slightly higher than goal in the morning and she feels that she would benefit from increasing her morning glipizide.  I agree with this plan.  I advised her to take 2 of the low-dose glipizide tablets before breakfast.  She is occasionally taking half of the glipizide tablet before dinner.  We will continue with this. - I suggested to: Patient Instructions  Please increase: - Glipizide XL 5 mg daily before b'fast. You can take an extra 2.5 mg before a larger dinner.  Check sugars once a day, rotating check times.  Please return in 4 months with your sugar log.   - we checked her HbA1c: 6.5% (higher - advised to check sugars at different times of the day - 1x a day, rotating check times - advised for yearly eye exams >> she is UTD - return to clinic in 4 months  2. HL -Reviewed latest lipid panel from 11/2019: Triglycerides slightly high, higher than before, LDL at goal -She continues on Crestor 5 mg daily without side effects -We will check a lipid panel now.  She is not fasting.  3. Overweight -She could not afford SGLT2 inhibitors, which would have helped with weight loss -Weight was stable at last visit; she gained 2 pounds since then  + flu shot today  Component     Latest Ref Rng & Units 11/30/2020   Cholesterol     0 - 200 mg/dL 139  Triglycerides     0.0 - 149.0 mg/dL 278.0 (H)  HDL Cholesterol     >39.00 mg/dL 42.70  VLDL     0.0 - 40.0 mg/dL 55.6 (H)  Total CHOL/HDL Ratio      3  NonHDL      96.71  Hemoglobin A1C     4.0 - 5.6 % 6.5 (A)  Direct LDL     mg/dL 59.0  LDL at goal, but triglycerides are elevated.  I will advise her to start fish oil 1000 mg twice a day.  Philemon Kingdom, MD PhD [facility]

## 2020-11-30 NOTE — Addendum Note (Signed)
Addended by: Lauralyn Primes on: 11/30/2020 03:36 PM   Modules accepted: Orders

## 2020-11-30 NOTE — Patient Instructions (Addendum)
Please increase: - Glipizide XL 5 mg daily before b'fast. You can take an extra 2.5 mg before a larger dinner.  Check sugars once a day, rotating check times.  Please return in 4 months with your sugar log.

## 2020-12-04 DIAGNOSIS — M0609 Rheumatoid arthritis without rheumatoid factor, multiple sites: Secondary | ICD-10-CM | POA: Diagnosis not present

## 2020-12-05 DIAGNOSIS — G43009 Migraine without aura, not intractable, without status migrainosus: Secondary | ICD-10-CM | POA: Diagnosis not present

## 2020-12-05 DIAGNOSIS — M47816 Spondylosis without myelopathy or radiculopathy, lumbar region: Secondary | ICD-10-CM | POA: Diagnosis not present

## 2020-12-05 DIAGNOSIS — I10 Essential (primary) hypertension: Secondary | ICD-10-CM | POA: Diagnosis not present

## 2020-12-05 DIAGNOSIS — K219 Gastro-esophageal reflux disease without esophagitis: Secondary | ICD-10-CM | POA: Diagnosis not present

## 2020-12-05 DIAGNOSIS — E7800 Pure hypercholesterolemia, unspecified: Secondary | ICD-10-CM | POA: Diagnosis not present

## 2020-12-05 DIAGNOSIS — H401390 Pigmentary glaucoma, unspecified eye, stage unspecified: Secondary | ICD-10-CM | POA: Diagnosis not present

## 2020-12-05 DIAGNOSIS — I2510 Atherosclerotic heart disease of native coronary artery without angina pectoris: Secondary | ICD-10-CM | POA: Diagnosis not present

## 2020-12-05 DIAGNOSIS — N183 Chronic kidney disease, stage 3 unspecified: Secondary | ICD-10-CM | POA: Diagnosis not present

## 2020-12-05 DIAGNOSIS — M1990 Unspecified osteoarthritis, unspecified site: Secondary | ICD-10-CM | POA: Diagnosis not present

## 2020-12-05 DIAGNOSIS — E1122 Type 2 diabetes mellitus with diabetic chronic kidney disease: Secondary | ICD-10-CM | POA: Diagnosis not present

## 2020-12-07 DIAGNOSIS — M48062 Spinal stenosis, lumbar region with neurogenic claudication: Secondary | ICD-10-CM | POA: Diagnosis not present

## 2020-12-19 ENCOUNTER — Other Ambulatory Visit: Payer: Self-pay | Admitting: Rheumatology

## 2020-12-19 DIAGNOSIS — E2839 Other primary ovarian failure: Secondary | ICD-10-CM

## 2020-12-20 DIAGNOSIS — Z1389 Encounter for screening for other disorder: Secondary | ICD-10-CM | POA: Diagnosis not present

## 2020-12-20 DIAGNOSIS — Z0000 Encounter for general adult medical examination without abnormal findings: Secondary | ICD-10-CM | POA: Diagnosis not present

## 2021-01-11 DIAGNOSIS — Z96652 Presence of left artificial knee joint: Secondary | ICD-10-CM | POA: Diagnosis not present

## 2021-01-12 DIAGNOSIS — E1122 Type 2 diabetes mellitus with diabetic chronic kidney disease: Secondary | ICD-10-CM | POA: Diagnosis not present

## 2021-01-12 DIAGNOSIS — R109 Unspecified abdominal pain: Secondary | ICD-10-CM | POA: Diagnosis not present

## 2021-01-12 DIAGNOSIS — R945 Abnormal results of liver function studies: Secondary | ICD-10-CM | POA: Diagnosis not present

## 2021-01-15 DIAGNOSIS — M0609 Rheumatoid arthritis without rheumatoid factor, multiple sites: Secondary | ICD-10-CM | POA: Diagnosis not present

## 2021-01-17 ENCOUNTER — Other Ambulatory Visit: Payer: Self-pay | Admitting: Family Medicine

## 2021-01-17 DIAGNOSIS — Z6828 Body mass index (BMI) 28.0-28.9, adult: Secondary | ICD-10-CM | POA: Diagnosis not present

## 2021-01-17 DIAGNOSIS — M48062 Spinal stenosis, lumbar region with neurogenic claudication: Secondary | ICD-10-CM | POA: Diagnosis not present

## 2021-01-17 DIAGNOSIS — R109 Unspecified abdominal pain: Secondary | ICD-10-CM

## 2021-02-06 DIAGNOSIS — M069 Rheumatoid arthritis, unspecified: Secondary | ICD-10-CM | POA: Insufficient documentation

## 2021-02-06 NOTE — Assessment & Plan Note (Signed)
Followed by Rheumatology/Dr. Trudie Reed

## 2021-02-06 NOTE — Assessment & Plan Note (Signed)
Benefits from CPAP with no concerns. Plan-replace old machine auto 8-15

## 2021-02-07 DIAGNOSIS — B078 Other viral warts: Secondary | ICD-10-CM | POA: Diagnosis not present

## 2021-02-07 DIAGNOSIS — L820 Inflamed seborrheic keratosis: Secondary | ICD-10-CM | POA: Diagnosis not present

## 2021-02-07 DIAGNOSIS — Z85828 Personal history of other malignant neoplasm of skin: Secondary | ICD-10-CM | POA: Diagnosis not present

## 2021-02-07 DIAGNOSIS — L821 Other seborrheic keratosis: Secondary | ICD-10-CM | POA: Diagnosis not present

## 2021-02-08 ENCOUNTER — Ambulatory Visit
Admission: RE | Admit: 2021-02-08 | Discharge: 2021-02-08 | Disposition: A | Discharge status: Home / Self Care | Payer: Medicare Other | Source: Ambulatory Visit | Attending: Family Medicine | Admitting: Family Medicine

## 2021-02-08 DIAGNOSIS — K760 Fatty (change of) liver, not elsewhere classified: Secondary | ICD-10-CM | POA: Diagnosis not present

## 2021-02-08 DIAGNOSIS — N281 Cyst of kidney, acquired: Secondary | ICD-10-CM | POA: Diagnosis not present

## 2021-02-08 DIAGNOSIS — R109 Unspecified abdominal pain: Secondary | ICD-10-CM

## 2021-02-16 DIAGNOSIS — M5416 Radiculopathy, lumbar region: Secondary | ICD-10-CM | POA: Diagnosis not present

## 2021-02-19 DIAGNOSIS — M48062 Spinal stenosis, lumbar region with neurogenic claudication: Secondary | ICD-10-CM | POA: Diagnosis not present

## 2021-02-27 DIAGNOSIS — Z79899 Other long term (current) drug therapy: Secondary | ICD-10-CM | POA: Diagnosis not present

## 2021-02-27 DIAGNOSIS — M0609 Rheumatoid arthritis without rheumatoid factor, multiple sites: Secondary | ICD-10-CM | POA: Diagnosis not present

## 2021-02-28 DIAGNOSIS — M5416 Radiculopathy, lumbar region: Secondary | ICD-10-CM | POA: Diagnosis not present

## 2021-03-09 ENCOUNTER — Telehealth: Payer: Self-pay

## 2021-03-09 DIAGNOSIS — M5416 Radiculopathy, lumbar region: Secondary | ICD-10-CM | POA: Diagnosis not present

## 2021-03-09 NOTE — Telephone Encounter (Signed)
Yes, she can double up on the glipizide doses if there are no lows.  We can also offer her an appointment next week.

## 2021-03-09 NOTE — Telephone Encounter (Signed)
Pt called back and wanted to know if she can take extra glipizide when experiencing high blood sugars. Pt had a spinal injection and had experienced some high blood sugars. If she has to get another one she wants to know how to keep her sugars controlled. Pt also requested rx for [facility] reader and sensors.

## 2021-03-09 NOTE — Telephone Encounter (Signed)
Called and left a message for pt following up on appt request made 2 weeks ago that mentioned some high blood sugar readings. Requested a call back.

## 2021-03-09 NOTE — Telephone Encounter (Signed)
Called and advised pt she can double dose for high blood sugar readings. Pt's appt was rescheduled for next week.

## 2021-03-14 DIAGNOSIS — N183 Chronic kidney disease, stage 3 unspecified: Secondary | ICD-10-CM | POA: Diagnosis not present

## 2021-03-14 DIAGNOSIS — E1122 Type 2 diabetes mellitus with diabetic chronic kidney disease: Secondary | ICD-10-CM | POA: Diagnosis not present

## 2021-03-14 DIAGNOSIS — E7800 Pure hypercholesterolemia, unspecified: Secondary | ICD-10-CM | POA: Diagnosis not present

## 2021-03-14 DIAGNOSIS — K219 Gastro-esophageal reflux disease without esophagitis: Secondary | ICD-10-CM | POA: Diagnosis not present

## 2021-03-14 DIAGNOSIS — I10 Essential (primary) hypertension: Secondary | ICD-10-CM | POA: Diagnosis not present

## 2021-03-14 DIAGNOSIS — M5416 Radiculopathy, lumbar region: Secondary | ICD-10-CM | POA: Diagnosis not present

## 2021-03-15 ENCOUNTER — Encounter: Payer: Self-pay | Admitting: Internal Medicine

## 2021-03-15 ENCOUNTER — Ambulatory Visit (INDEPENDENT_AMBULATORY_CARE_PROVIDER_SITE_OTHER): Payer: Medicare Other | Admitting: Internal Medicine

## 2021-03-15 ENCOUNTER — Other Ambulatory Visit: Payer: Self-pay

## 2021-03-15 VITALS — BP 130/78 | HR 68 | Ht 66.0 in | Wt 181.0 lb

## 2021-03-15 DIAGNOSIS — E785 Hyperlipidemia, unspecified: Secondary | ICD-10-CM

## 2021-03-15 DIAGNOSIS — E663 Overweight: Secondary | ICD-10-CM

## 2021-03-15 DIAGNOSIS — E1165 Type 2 diabetes mellitus with hyperglycemia: Secondary | ICD-10-CM

## 2021-03-15 LAB — POCT GLYCOSYLATED HEMOGLOBIN (HGB A1C): Hemoglobin A1C: 6.5 % — AB (ref 4.0–5.6)

## 2021-03-15 MED ORDER — GLIPIZIDE ER 2.5 MG PO TB24: 2.5000 mg | ORAL_TABLET | Freq: Two times a day (BID) | ORAL | 3 refills | Status: DC

## 2021-03-15 MED ORDER — EMPAGLIFLOZIN 10 MG PO TABS: 10.0000 mg | ORAL_TABLET | Freq: Every day | ORAL | 3 refills | Status: DC

## 2021-03-15 NOTE — Progress Notes (Signed)
Patient ID: Tammy Pineda, female   DOB: [birth year], 76 y.o.   MRN: [id number]   This visit occurred during the SARS-CoV-2 public health emergency.  Safety protocols were in place, including screening questions prior to the visit, additional usage of staff PPE, and extensive cleaning of exam room while observing appropriate contact time as indicated for disinfecting solutions.    HPI: Tammy Pineda is a 76 y.o.-year-old female, initially referred by her PCP, Dr. Drema Dallas, returning for follow-up for DM2, dx "several years ago", non-insulin-dependent, uncontrolled, with complications (CAD, CKD stage III, NASH). Tammy Pineda, her husband, is also my pt. Last visit 4 mo ago.  Interim history: She continues to have left knee pain after her TKR and also has right knee pain.  She also has back pain.  On meloxicam, hydrocodone.  She is also getting steroid injections in back, after which her sugars increase to 200s. She continues to have a lot of stress at home with her husband having multiple medical problems and also taking care of her sister who has cognitive impairment. No increased urination, blurry vision, nausea, chest pain.  Reviewed HbA1c levels: Lab Results  Component Value Date   HGBA1C 6.5 (A) 11/30/2020   HGBA1C 6.1 (A) 07/27/2020   HGBA1C 6.1 (A) 04/18/2020   HGBA1C 6.7 (A) 08/11/2019   HGBA1C 6.9 (A) 05/03/2019   HGBA1C 6.4 (A) 02/08/2019   HGBA1C 5.9 10/08/2018   HGBA1C 6.2 (A) 08/04/2018  11/25/2019: HbA1c 6.5% 02/26/2018: HbA1c 6.5% 09/20/2017: HbA1c 7.3% 04/18/2017: HbA1c 7.6%  Previously on: - Metformin 1000 mg 2x a day, with meals -added 11/2017 - had significant diarrhea >> metformin ER 1000 mg 2x a day which she was tolerating well, however, she then started to have severe diarrhea >> she stopped in 09/2018. We tried [place] in the past but this was too expensive.  Currently on: - Glipizide XL 2.5 >> 5 >> 2.5 mg (decreased again b/c lows) before breakfast and 2.5 mg  before dinner  Pt checks her sugars once a day: - am: 110-139, 142 >> 88-142, 150 >> 121-148, 153 >> 109-142 - 2h after b'fast:n/c >> 66, 150-241 >> n/c - before lunch: 68 >> n/c >> 125 >> n/c >> 122, 130 >> 84-146 - 2h after lunch: 119, 150 >> n/c >> 164 >> n/c - before dinner:  n/c >> 57, 131, 138, 140 >> 96, 169 >> n/c - 2h after dinner: 125, 153 >> n/c >> 125 >> 162 >> n/c - bedtime: n/c >> 113, 117 >> n/c >> 183 >> 111-155 - nighttime: n/c >> 98, 108 Lowest sugar was 52 before dinner (ate only oatmeal that day) >> 96 >> 84; it is unclear at which level she has hypoglycemia awareness. Highest sugar was 162 >> 224 (steroids) >> 155  Glucometer: One Touch verio  Pt's meals are: - Breakfast: eggs, cereal + fruit, egg waffles and sugar-free syrup  - Lunch: salad, grilled chicken - Dinner: baked meat, veggies, starch  - Snacks: 1-2x - at bedtime - granola  In the past, she was exercising [facility] [employer] and also dancing 2-3 times a week.  However, she stopped during the coronavirus pandemic.  [facility]  -+ CKD stage III, last BUN/creatinine:  10/03/2020: 23/0.96, GR 61, Glu 181 Lab Results  Component Value Date   BUN 21 01/04/2020   BUN 25 (H) 12/22/2019   CREATININE 0.92 01/04/2020   CREATININE 0.85 12/22/2019  On losartan 50.  -+ HL; last  set of lipids: Lab Results  Component Value Date   CHOL 139 11/30/2020   HDL 42.70 11/30/2020   LDLCALC 70 12/22/2009   LDLDIRECT 59.0 11/30/2020   TRIG 278.0 (H) 11/30/2020   CHOLHDL 3 11/30/2020  11/25/2019: 151/268/43/65 On Crestor 5. I rec'd  fish oil 1000 mg daily after last visit >> did not start.  - last eye exam was in 2022: + Glaucoma, no DR. Dr. Katy Fitch.  She has a history of cataract surgery.  - no numbness and tingling in her feet.  Pt has FH of DM in father and brother.   She also has a history of HTN-on Lasix as needed, IBS with diarrhea, GERD, vaginal prolapse - h/o surgery. She also has NASH  with stage 2 fibrosis-currently in a new study.  She was previously in another study that was using a drug which helped her significantly (HbA1c dropped to 6.2%), however, the study was stopped and the drug is not available.  She sees Dr. Trudie Reed for RA.  On Remicade. She had left TKR 01/03/2020 - having a lot of pain after the surgery.  She also had a DVT in the L leg - on Xarelto x 3 mo.  Vitamin B12 (11/25/2019): 504, normal.  ROS: + see HPI  I reviewed pt's medications, allergies, PMH, social hx, family hx, and changes were documented in the history of present illness. Otherwise, unchanged from my initial visit note.  Past Medical History:  Diagnosis Date   Anxiety    Arthritis    Chronic kidney disease    Coronary artery disease    nonobstructive by cardiac catheterization 2011 with a 30% LAD lesion   Depression    Diabetes (HCC)    Type 2   GERD (gastroesophageal reflux disease)    Glaucoma    Hypercholesteremia    Hypertension    NASH (nonalcoholic steatohepatitis)    NASH   Pneumonia    30 years ago   PVC (premature ventricular contraction)    Seasonal allergies    Sleep apnea    Spinal stenosis    Past Surgical History:  Procedure Laterality Date   ANTERIOR AND POSTERIOR VAGINAL REPAIR     APPENDECTOMY  1959   BLADDER SUSPENSION     BUNIONECTOMY     CARDIAC CATHETERIZATION  2011   clean cath   CARDIOVASCULAR STRESS TEST     CATARACT EXTRACTION W/ INTRAOCULAR LENS IMPLANT Right 2010   KNEE ARTHROSCOPY Left    NASAL RECONSTRUCTION  1976   SHOULDER ARTHROSCOPY Left    TOTAL ABDOMINAL HYSTERECTOMY  1976   TOTAL KNEE ARTHROPLASTY Left 01/03/2020   Procedure: TOTAL KNEE ARTHROPLASTY;  Surgeon: Gaynelle Arabian, MD;  Location: [facility];  Service: Orthopedics;  Laterality: Left;  64mn   Social History   Socioeconomic History   Marital status: Married    Spouse name: Not on file   Number of children: 1   Years of education: Not on file   Highest education level:  Not on file  Occupational History   Occupation: RTherapist, 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     ASPIRIN 81 PO Take 81 mg by mouth daily.     brimonidine (ALPHAGAN) 0.2 % ophthalmic solution Place 1 drop into both eyes 2 (two) times daily.     Cholecalciferol (VITAMIN D) 2000 UNITS CAPS Take 2,000 Units by mouth daily.      Cranberry-Vitamin C-Vitamin E (CRANBERRY PLUS VITAMIN C) 4200-20-3 MG-MG-UNIT CAPS Take by mouth.     escitalopram (LEXAPRO) 10 MG tablet Take 10 mg by mouth at bedtime.      glipiZIDE (GLUCOTROL XL) 2.5 MG 24 hr tablet Take 2 tablets (5 mg total) by mouth daily before breakfast. 180 tablet 3   hydrochlorothiazide (MICROZIDE) 12.5 MG capsule Take 12.5 mg by mouth every evening.      inFLIXimab (REMICADE) 100 MG injection Inject 100 mg into the vein every 6 (six) weeks.     latanoprost (XALATAN) 0.005 % ophthalmic solution Place 1 drop into both eyes at bedtime.     losartan (COZAAR) 50 MG tablet Take 50 mg by mouth at bedtime.     meclizine (ANTIVERT) 12.5 MG tablet Take 1 tablet (12.5 mg total) by mouth 3 (three) times daily as needed for dizziness. 21 tablet 0   methocarbamol (ROBAXIN) 500 MG tablet Take 1 tablet (500 mg total) by mouth every 6 (six) hours as needed for muscle spasms. 40 tablet 0   Misc Natural Products (TURMERIC CURCUMIN) CAPS Take 1 capsule by mouth daily. 1500 mg/capsule     omeprazole (PRILOSEC) 20 MG capsule Take 20 mg by mouth daily as needed (acid reflux/indigestion.). Pt takes as needed.     oxyCODONE (OXY IR/ROXICODONE) 5 MG immediate release tablet Take 1-2 tablets (5-10 mg total) by mouth every 6 (six)  hours as needed for moderate pain or severe pain. 42 tablet 0   rosuvastatin (CRESTOR) 5 MG tablet Take 5 mg by mouth at bedtime.      timolol (TIMOPTIC) 0.5 % ophthalmic solution Place 1 drop into both eyes daily.     vitamin B-12 (CYANOCOBALAMIN) 1000 MCG tablet Take 1,000 mcg by mouth daily.     Current Facility-Administered Medications on File Prior to Visit  Medication Dose Route Frequency Provider Last Rate Last Admin   0.9 %  sodium chloride infusion   Intravenous PRN Kathrine Haddock, NP       regadenoson (LEXISCAN) injection SOLN 0.4 mg  0.4 mg Intravenous Once Buford Dresser, MD       technetium tetrofosmin (TC-MYOVIEW) injection 28.0 millicurie  03.4 millicurie Intravenous Once PRN Buford Dresser, MD       Allergies  Allergen Reactions   Metformin And Related Diarrhea   Gabapentin Palpitations   Plaquenil [Hydroxychloroquine] Nausea And Vomiting   Sulfamethoxazole-Trimethoprim Rash   Tape Rash   Family History  Problem Relation Age of Onset   Heart disease Father        open heart surgery at 48   Colon cancer Father    Heart disease Mother        open heart surgery at age 22   Hypertension Mother    Heart attack Maternal Grandfather    Stroke Paternal Grandmother    Heart block Brother    PE: BP 130/78 (BP Location: Right Arm, Patient Position: Sitting, Cuff Size: Normal)    Pulse 68    Ht 5' 6"  (1.676 m)    Wt 181 lb (82.1 kg)    SpO2 98%    BMI 29.21 kg/m   Wt Readings from Last  3 Encounters:  03/15/21 181 lb (82.1 kg)  11/30/20 174 lb 9.6 oz (79.2 kg)  09/15/20 170 lb (77.1 kg)   Constitutional: overweight, in NAD Eyes: PERRLA, EOMI, no exophthalmos ENT: moist mucous membranes, no thyromegaly, no cervical lymphadenopathy Cardiovascular: RRR, No MRG Respiratory: CTA B Musculoskeletal: no deformities, strength intact in all 4 Skin: moist, warm, no rashes Neurological: no tremor with outstretched hands, DTR normal in all 4 Diabetic Foot Exam -  Simple   Simple Foot Form Diabetic Foot exam was performed with the following findings: Yes 03/15/2021  2:00 PM  Visual Inspection See comments: Yes Sensation Testing Intact to touch and monofilament testing bilaterally: Yes Pulse Check Posterior Tibialis and Dorsalis pulse intact bilaterally: Yes Comments Onychodystrophy first 2 toes of the left foot Erythema on the medial aspect of second right toe due to overlapping of the first 2 toes    ASSESSMENT: 1. DM2, non-insulin-dependent, uncontrolled, with long-term complications - CAD - CKD stage III - NASH  2. HL  3. Overweight  PLAN:  1. Patient with longstanding, previously uncontrolled type 2 diabetes, with improved control initially after starting Farxiga, but unfortunately he had to come off the medication because of price.  She could not tolerate metformin ER due to diarrhea.  So, for now, she is only on low-dose sulfonylurea. At last OV, sugars are slightly higher than goal in the morning and she felt that she could benefit from taking more glipizide in the morning.  We increased the dose from 2.5 mg to 5 mg before breakfast. Continued the glipizide dose before a larger dinner.  HbA1c was 6.5%, higher. -At today's visit, sugars are at or slightly above target throughout the day.  She tells me that she try to use a higher dose of glipizide, 5 mg in a.m., but this dropped her sugars too low so she backed off to 2.5 mg in a.m.  For now, we will continue this dose and she also takes at 2.5 mg before dinner.  She is not taking it only before a larger dinner, as advised at last visit, but before every dinner.  No lows overnight.  At today's visit, we checked and her insurance appears to be covering [place] so I sent the prescription for the 10 mg of Jardiance to her pharmacy.  I advised her that if the sugars improve after starting this, to stop glipizide. -She is interested in a freestyle libre CGM.  Given a list of suppliers and advised  her how to proceed with this.  However, she is not on insulin so I am not sure if this would be approved for her. - I suggested to: Patient Instructions  Please continue: - Glipizide XL 2.5 mg daily before b'fast and  2.5 mg before a larger dinner  Add: - Jardiance 10 mg before b'fast  If the sugars improve, try to stop Glipizide.  Try to start Fish oil 1000 mg daily.  The most common suppliers for the continuous glucose monitor are:  [place] [employer]: [place]: [phone] Ext [phone] [employer]: [place]: [place]: [phone] [place] Supp[employer]: [phone]  Philemon Kingdom, MD PhD [facility] [address] [place], [place] [address] Tel. [phone] Fax [phone]  Please return in 4 months with your sugar log.   - we checked her HbA1c: 6.5% - stable - advised to check sugars at different times of the day - 1x a day, rotating check times - advised for yearly  eye exams >> she is UTD - foot exam performed today -she has to overlap toes-discussed the use of spacers - return to clinic in 4 months  2. HL - Reviewed latest lipid panel from 11/2020: Fractions at goal with the exception of a high triglyceride level: Lab Results  Component Value Date   CHOL 139 11/30/2020   HDL 42.70 11/30/2020   LDLCALC 70 12/22/2009   LDLDIRECT 59.0 11/30/2020   TRIG 278.0 (H) 11/30/2020   CHOLHDL 3 11/30/2020  -She continues on Crestor 5 mg daily without side effects.  At last visit I also advised her to add fish oil 1000 mg daily  3. Overweight -We tried an SGLT2 inhibitor which would have helped with weight loss, but this was not affordable.  At this visit we will retry Jardiance. -She gained 2 pounds before last visit -She gained 7 pounds since last visit   Philemon Kingdom, MD PhD [facility]

## 2021-03-15 NOTE — Patient Instructions (Addendum)
Please continue: - Glipizide XL 2.5 mg daily before b'fast and  2.5 mg before a larger dinner  Add: - Jardiance 10 mg before b'fast  If the sugars improve, try to stop Glipizide.  Try to start Fish oil 1000 mg daily.  The most common suppliers for the continuous glucose monitor are:  [place] [employer]: [place]: [phone] Ext [phone] [employer]: [place]: [place]: [phone] [place] Supp[employer]: [phone]  Philemon Kingdom, MD PhD [facility] [address] [place], [place] [address] Tel. [phone] Fax [phone]  Please return in 4 months with your sugar log.

## 2021-03-16 ENCOUNTER — Telehealth: Payer: Self-pay

## 2021-03-16 DIAGNOSIS — Z85828 Personal history of other malignant neoplasm of skin: Secondary | ICD-10-CM | POA: Diagnosis not present

## 2021-03-16 DIAGNOSIS — L820 Inflamed seborrheic keratosis: Secondary | ICD-10-CM | POA: Diagnosis not present

## 2021-03-16 DIAGNOSIS — L570 Actinic keratosis: Secondary | ICD-10-CM | POA: Diagnosis not present

## 2021-03-16 NOTE — Telephone Encounter (Signed)
Called and spoke with pt. Application mailed to address on file.

## 2021-03-16 NOTE — Telephone Encounter (Signed)
Let's try pt assistance

## 2021-03-16 NOTE — Telephone Encounter (Signed)
Pt unable to afford rx for Jardiance (over $500) pt said she can continue glipizide if there is no other alternative.

## 2021-03-26 ENCOUNTER — Encounter: Payer: Self-pay | Admitting: Internal Medicine

## 2021-04-04 DIAGNOSIS — Z6829 Body mass index (BMI) 29.0-29.9, adult: Secondary | ICD-10-CM | POA: Diagnosis not present

## 2021-04-04 DIAGNOSIS — I10 Essential (primary) hypertension: Secondary | ICD-10-CM | POA: Diagnosis not present

## 2021-04-04 DIAGNOSIS — M4316 Spondylolisthesis, lumbar region: Secondary | ICD-10-CM | POA: Diagnosis not present

## 2021-04-06 DIAGNOSIS — I2510 Atherosclerotic heart disease of native coronary artery without angina pectoris: Secondary | ICD-10-CM | POA: Diagnosis not present

## 2021-04-06 DIAGNOSIS — K219 Gastro-esophageal reflux disease without esophagitis: Secondary | ICD-10-CM | POA: Diagnosis not present

## 2021-04-06 DIAGNOSIS — I700 Atherosclerosis of aorta: Secondary | ICD-10-CM | POA: Diagnosis not present

## 2021-04-06 DIAGNOSIS — F419 Anxiety disorder, unspecified: Secondary | ICD-10-CM | POA: Diagnosis not present

## 2021-04-06 DIAGNOSIS — M48061 Spinal stenosis, lumbar region without neurogenic claudication: Secondary | ICD-10-CM | POA: Diagnosis not present

## 2021-04-06 DIAGNOSIS — J029 Acute pharyngitis, unspecified: Secondary | ICD-10-CM | POA: Diagnosis not present

## 2021-04-06 DIAGNOSIS — M47816 Spondylosis without myelopathy or radiculopathy, lumbar region: Secondary | ICD-10-CM | POA: Diagnosis not present

## 2021-04-06 DIAGNOSIS — Z20822 Contact with and (suspected) exposure to covid-19: Secondary | ICD-10-CM | POA: Diagnosis not present

## 2021-04-06 DIAGNOSIS — K7581 Nonalcoholic steatohepatitis (NASH): Secondary | ICD-10-CM | POA: Diagnosis not present

## 2021-04-06 DIAGNOSIS — E7800 Pure hypercholesterolemia, unspecified: Secondary | ICD-10-CM | POA: Diagnosis not present

## 2021-04-06 DIAGNOSIS — E1122 Type 2 diabetes mellitus with diabetic chronic kidney disease: Secondary | ICD-10-CM | POA: Diagnosis not present

## 2021-04-06 DIAGNOSIS — I10 Essential (primary) hypertension: Secondary | ICD-10-CM | POA: Diagnosis not present

## 2021-04-06 DIAGNOSIS — R059 Cough, unspecified: Secondary | ICD-10-CM | POA: Diagnosis not present

## 2021-04-09 DIAGNOSIS — R300 Dysuria: Secondary | ICD-10-CM | POA: Diagnosis not present

## 2021-04-09 DIAGNOSIS — R059 Cough, unspecified: Secondary | ICD-10-CM | POA: Diagnosis not present

## 2021-04-12 ENCOUNTER — Ambulatory Visit: Payer: Medicare Other | Admitting: Internal Medicine

## 2021-04-18 DIAGNOSIS — E559 Vitamin D deficiency, unspecified: Secondary | ICD-10-CM | POA: Diagnosis not present

## 2021-04-18 DIAGNOSIS — E7800 Pure hypercholesterolemia, unspecified: Secondary | ICD-10-CM | POA: Diagnosis not present

## 2021-04-18 DIAGNOSIS — N183 Chronic kidney disease, stage 3 unspecified: Secondary | ICD-10-CM | POA: Diagnosis not present

## 2021-04-18 DIAGNOSIS — E1122 Type 2 diabetes mellitus with diabetic chronic kidney disease: Secondary | ICD-10-CM | POA: Diagnosis not present

## 2021-04-23 DIAGNOSIS — H6981 Other specified disorders of Eustachian tube, right ear: Secondary | ICD-10-CM | POA: Diagnosis not present

## 2021-04-27 ENCOUNTER — Encounter: Payer: Self-pay | Admitting: Internal Medicine

## 2021-04-27 DIAGNOSIS — E1165 Type 2 diabetes mellitus with hyperglycemia: Secondary | ICD-10-CM

## 2021-04-27 MED ORDER — ONETOUCH VERIO VI STRP: ORAL_STRIP | 3 refills | Status: DC

## 2021-05-01 DIAGNOSIS — M0609 Rheumatoid arthritis without rheumatoid factor, multiple sites: Secondary | ICD-10-CM | POA: Diagnosis not present

## 2021-05-04 ENCOUNTER — Encounter: Payer: Self-pay | Admitting: Internal Medicine

## 2021-05-04 DIAGNOSIS — R8290 Unspecified abnormal findings in urine: Secondary | ICD-10-CM | POA: Diagnosis not present

## 2021-05-04 DIAGNOSIS — E1165 Type 2 diabetes mellitus with hyperglycemia: Secondary | ICD-10-CM

## 2021-05-08 MED ORDER — FREESTYLE LIBRE 2 SENSOR MISC: 1 refills | Status: DC

## 2021-05-28 DIAGNOSIS — R509 Fever, unspecified: Secondary | ICD-10-CM | POA: Diagnosis not present

## 2021-05-28 DIAGNOSIS — N3941 Urge incontinence: Secondary | ICD-10-CM | POA: Diagnosis not present

## 2021-05-28 DIAGNOSIS — Z03818 Encounter for observation for suspected exposure to other biological agents ruled out: Secondary | ICD-10-CM | POA: Diagnosis not present

## 2021-05-29 DIAGNOSIS — Z79899 Other long term (current) drug therapy: Secondary | ICD-10-CM | POA: Diagnosis not present

## 2021-05-29 DIAGNOSIS — M48061 Spinal stenosis, lumbar region without neurogenic claudication: Secondary | ICD-10-CM | POA: Diagnosis not present

## 2021-05-29 DIAGNOSIS — M0609 Rheumatoid arthritis without rheumatoid factor, multiple sites: Secondary | ICD-10-CM | POA: Diagnosis not present

## 2021-05-29 DIAGNOSIS — K760 Fatty (change of) liver, not elsewhere classified: Secondary | ICD-10-CM | POA: Diagnosis not present

## 2021-05-29 DIAGNOSIS — Z6829 Body mass index (BMI) 29.0-29.9, adult: Secondary | ICD-10-CM | POA: Diagnosis not present

## 2021-05-29 DIAGNOSIS — E663 Overweight: Secondary | ICD-10-CM | POA: Diagnosis not present

## 2021-05-29 DIAGNOSIS — M1991 Primary osteoarthritis, unspecified site: Secondary | ICD-10-CM | POA: Diagnosis not present

## 2021-05-30 DIAGNOSIS — L820 Inflamed seborrheic keratosis: Secondary | ICD-10-CM | POA: Diagnosis not present

## 2021-05-30 DIAGNOSIS — L821 Other seborrheic keratosis: Secondary | ICD-10-CM | POA: Diagnosis not present

## 2021-05-30 DIAGNOSIS — D485 Neoplasm of uncertain behavior of skin: Secondary | ICD-10-CM | POA: Diagnosis not present

## 2021-05-30 DIAGNOSIS — C44329 Squamous cell carcinoma of skin of other parts of face: Secondary | ICD-10-CM | POA: Diagnosis not present

## 2021-05-30 DIAGNOSIS — Z85828 Personal history of other malignant neoplasm of skin: Secondary | ICD-10-CM | POA: Diagnosis not present

## 2021-05-30 DIAGNOSIS — L570 Actinic keratosis: Secondary | ICD-10-CM | POA: Diagnosis not present

## 2021-06-01 ENCOUNTER — Ambulatory Visit
Admission: RE | Admit: 2021-06-01 | Discharge: 2021-06-01 | Disposition: A | Discharge status: Home / Self Care | Payer: Medicare Other | Source: Ambulatory Visit | Attending: Rheumatology | Admitting: Rheumatology

## 2021-06-01 DIAGNOSIS — E2839 Other primary ovarian failure: Secondary | ICD-10-CM

## 2021-06-01 DIAGNOSIS — Z780 Asymptomatic menopausal state: Secondary | ICD-10-CM | POA: Diagnosis not present

## 2021-06-01 DIAGNOSIS — M8589 Other specified disorders of bone density and structure, multiple sites: Secondary | ICD-10-CM | POA: Diagnosis not present

## 2021-06-03 DIAGNOSIS — Z20822 Contact with and (suspected) exposure to covid-19: Secondary | ICD-10-CM | POA: Diagnosis not present

## 2021-06-04 DIAGNOSIS — M4316 Spondylolisthesis, lumbar region: Secondary | ICD-10-CM | POA: Diagnosis not present

## 2021-06-04 DIAGNOSIS — Z6829 Body mass index (BMI) 29.0-29.9, adult: Secondary | ICD-10-CM | POA: Diagnosis not present

## 2021-06-05 ENCOUNTER — Telehealth: Payer: Self-pay

## 2021-06-05 NOTE — Telephone Encounter (Signed)
Pt called to advise her Bone density scan came back and the ordering provider wants pt put on a osteopetrosis medication. Pt requested a medication be sent in as soon as possible. ?

## 2021-06-06 DIAGNOSIS — Z20822 Contact with and (suspected) exposure to covid-19: Secondary | ICD-10-CM | POA: Diagnosis not present

## 2021-06-06 NOTE — Telephone Encounter (Signed)
We can discuss about this at next OV, in that case ?

## 2021-06-06 NOTE — Telephone Encounter (Signed)
Pt contacted and advised an appt would be needed before a prescription can be sent in. Pt requested her appt be moved up sooner. Please advise. ?

## 2021-06-12 DIAGNOSIS — N390 Urinary tract infection, site not specified: Secondary | ICD-10-CM | POA: Diagnosis not present

## 2021-06-12 DIAGNOSIS — R300 Dysuria: Secondary | ICD-10-CM | POA: Diagnosis not present

## 2021-06-18 ENCOUNTER — Ambulatory Visit (INDEPENDENT_AMBULATORY_CARE_PROVIDER_SITE_OTHER): Payer: Medicare Other | Admitting: Internal Medicine

## 2021-06-18 ENCOUNTER — Encounter: Payer: Self-pay | Admitting: Internal Medicine

## 2021-06-18 VITALS — BP 130/74 | HR 68 | Ht 66.0 in | Wt 182.4 lb

## 2021-06-18 DIAGNOSIS — E1165 Type 2 diabetes mellitus with hyperglycemia: Secondary | ICD-10-CM

## 2021-06-18 DIAGNOSIS — E663 Overweight: Secondary | ICD-10-CM

## 2021-06-18 DIAGNOSIS — E785 Hyperlipidemia, unspecified: Secondary | ICD-10-CM | POA: Diagnosis not present

## 2021-06-18 DIAGNOSIS — M810 Age-related osteoporosis without current pathological fracture: Secondary | ICD-10-CM | POA: Diagnosis not present

## 2021-06-18 LAB — POCT GLYCOSYLATED HEMOGLOBIN (HGB A1C): Hemoglobin A1C: 6.7 % — AB (ref 4.0–5.6)

## 2021-06-18 LAB — VITAMIN D 25 HYDROXY (VIT D DEFICIENCY, FRACTURES): VITD: 26.22 ng/mL — ABNORMAL LOW (ref 30.00–100.00)

## 2021-06-18 MED ORDER — FREESTYLE LIBRE 2 READER DEVI: 0 refills | Status: DC

## 2021-06-18 MED ORDER — FREESTYLE LIBRE 2 SENSOR MISC: 1 refills | Status: DC

## 2021-06-18 NOTE — Progress Notes (Signed)
Patient ID: Tammy Pineda, female   DOB: [birth year], 76 y.o.   MRN: [id number]  ? ?This visit occurred during the SARS-CoV-2 public health emergency.  Safety protocols were in place, including screening questions prior to the visit, additional usage of staff PPE, and extensive cleaning of exam room while observing appropriate contact time as indicated for disinfecting solutions.   ? ?HPI: ?Tammy Pineda is a 76 y.o.-year-old female, initially referred by her PCP, Dr. Drema Dallas, returning for follow-up for DM2, dx "several years ago", non-insulin-dependent, uncontrolled, with complications (CAD, CKD stage III, NASH). Tammy Pineda, her husband, is also my pt. Last visit 3 mo ago.  She returns sooner to also address her clinical osteoporosis. ? ?Interim history: ?She continues to have left knee pain after her TKR and also has right knee pain and knee weaknes.  She also has back pain, for which she is getting steroid injections - last 4 mo ago.  On meloxicam, hydrocodone.  ?She continues to have a lot of stress at home with her husband having multiple medical problems and also taking care of her sister who has cognitive impairment.  Also, her sister is in the nursing home after she had a hip fracture.  She has dementia. ?No blurry vision, nausea, chest pain. ?She recently had a bone density scan that showed worsening of her T-scores.  Her 10-year risk of fracture (FRAX) is high, prompting the need for treatment. ?She has repeated UTIs in last 3 months. ? ?DM2: ?Reviewed HbA1c levels: ?Lab Results  ?Component Value Date  ? HGBA1C 6.5 (A) 03/15/2021  ? HGBA1C 6.5 (A) 11/30/2020  ? HGBA1C 6.1 (A) 07/27/2020  ? HGBA1C 6.1 (A) 04/18/2020  ? HGBA1C 6.7 (A) 08/11/2019  ? HGBA1C 6.9 (A) 05/03/2019  ? HGBA1C 6.4 (A) 02/08/2019  ? HGBA1C 5.9 10/08/2018  ? HGBA1C 6.2 (A) 08/04/2018  ?11/25/2019: HbA1c 6.5% ?02/26/2018: HbA1c 6.5% ?09/20/2017: HbA1c 7.3% ?04/18/2017: HbA1c 7.6% ? ?Previously on: ?- Metformin 1000 mg 2x a day, with  meals -added 11/2017 - had significant diarrhea >> metformin ER 1000 mg 2x a day which she was tolerating well, however, she then started to have severe diarrhea >> she stopped in 09/2018. ?We tried [place] in the past but this was too expensive. ? ?Currently on: ?- Glipizide XL 2.5 >> 5 >> 2.5 mg (decreased again b/c lows) before breakfast and 2.5 mg before dinner ?We tried Jardiance in 03/2021 and this was too expensive. ? ?Pt checks her sugars once a day: ?- am: 110-139, 142 >> 88-142, 150 >> 121-148, 153 >> 109-142 >> 116-140s ?- 2h after b'fast:n/c >> 66, 150-241 >> n/c ?- before lunch: 68 >> n/c >> 125 >> n/c >> 122, 130 >> 84-146 >> n/c ?- 2h after lunch: 119, 150 >> n/c >> 164 >> n/c ?- before dinner:  n/c >> 57, 131, 138, 140 >> 96, 169 >> n/c ?- 2h after dinner: 125, 153 >> n/c >> 125 >> 162 >> n/c ?- bedtime: n/c >> 113, 117 >> n/c >> 183 >> 111-155 >> <188 ?- nighttime: n/c >> 98, 108 ?Lowest sugar was 52 before dinner (ate only oatmeal that day) >> 96 >> 84 >> 116; it is unclear at which level she has hypoglycemia awareness. ?Highest sugar was 162 >> 224 (steroids) >> 155 >> 188 ? ?Glucometer: One Touch verio ? ?Pt's meals are: ?- Breakfast: eggs, cereal + fruit, egg waffles and sugar-free syrup  ?- Lunch: salad, grilled chicken ?- Dinner: baked meat, veggies, starch  ?-  Snacks: 1-2x - at bedtime - granola ? ?In the past, she was exercising at [employer] and also dancing 2-3 times a week.  However, she stopped during the coronavirus pandemic.  [facility] ? ?-+ CKD stage III, last BUN/creatinine:  ?10/03/2020: 23/0.96, GR 61, Glu 181 ?Lab Results  ?Component Value Date  ? BUN 21 01/04/2020  ? BUN 25 (H) 12/22/2019  ? CREATININE 0.92 01/04/2020  ? CREATININE 0.85 12/22/2019  ?On losartan 50. ? ?-+ HL; last set of lipids: ?Lab Results  ?Component Value Date  ? CHOL 139 11/30/2020  ? HDL 42.70 11/30/2020  ? [place] 70 12/22/2009  ? LDLDIRECT 59.0 11/30/2020  ? TRIG 278.0 (H)  11/30/2020  ? CHOLHDL 3 11/30/2020  ?11/25/2019: 151/268/43/65 ?On Crestor 5. I rec'd  fish oil 1000 mg daily after last visit >> tried >> indigestion. ? ?- last eye exam was in 2022: + Glaucoma, no DR. Dr. Katy Fitch.  She has a history of cataract surgery. ? ?- no numbness and tingling in her feet.  Foot exam performed 03/2021. ? ?Pt has FH of DM in father and brother.  ? ?She also has a history of HTN-on Lasix as needed, IBS with diarrhea, GERD, vaginal prolapse - h/o surgery. ?She also has NASH with stage 2 fibrosis-currently in a new study.  She was previously in another study that was using a drug which helped her significantly (HbA1c dropped to 6.2%), however, the study was stopped and the drug is not available.  ?She sees Dr. Trudie Reed for RA.  On Remicade. ?She had left TKR 01/03/2020 - having a lot of pain after the surgery.  She also had a DVT in the L leg - on Xarelto x 3 mo. ? ?At this visit, she also wants to address osteoporosis (OP): ? ?I reviewed pt's DXA scans: ?Date L1-L4 T score FN T score 33% distal Radius Ultra distal radius  ?06/01/2021 N/a RFN: -2.1 ?LFN: -2.4 -1.7 (-3.4%) -1.5  ?12/25/2017 N/a  RFN: -0.9 ?LFN: -2.1 -1.5 -1.8  ?FRAX: MOF 24.2%, 10-year hip fracture risk 7.2% ? ?She has the following fractures: ?Left wrist - in her 49s ?Skull - 01/2019 - tripped in the driveway ? ?No dizziness/vertigo/orthostasis/poor vision.  ? ?No previous OP treatments. ? ?No h/o vitamin D deficiency. Reviewed available vit D levels: ?10/29/2017: Vitamin D 35 ?No results found for: VD25OH ? ?Pt is on: ?- vitamin D  - 2000 units daily ? ?No weight bearing exercises. R shoulder rotator cuff.  ? ?She does not take high vitamin A doses. ? ?Menopause was at 76 y/o - postsurgical - HRT - stopped at 26.  ? ?FH of osteoporosis: sister - hip fracture. ? ?No h/o persistent hyper/hypocalcemia or hyperparathyroidism. No h/o kidney stones. ?02/27/2021: Calcium 9.7 (8.7-10.3) ?Lab Results  ?Component Value Date  ? CALCIUM 8.5 (L)  01/04/2020  ? CALCIUM 9.6 12/22/2019  ? CALCIUM 9.1 05/16/2019  ? CALCIUM 9.8 02/08/2019  ? CALCIUM 9.5 02/09/2012  ? CALCIUM 9.2 11/30/2009  ? CALCIUM 9.1 06/08/2008  ? CALCIUM 9.7 05/09/2006  ? ?No h/o thyrotoxicosis. Reviewed TSH recent levels:  ?Lab Results  ?Component Value Date  ? TSH 2.55 10/19/2009  ? TSH 1.55 05/09/2006  ? ?No h/o CKD. Last CMP: ? 2021-02-27     ?A/G Ratio 1.9   1.2-2.2  ?Albumin 4.7   3.7-4.7  ?Alkaline Phosphatase 258   44-121  ?ALT (SGPT) 86   0-32  ?AST (SGOT) 38   0-40  ?Bilirubin,  Total 0.4   0.0-1.2  ?BUN 21   8-27  ?BUN/Creatinine Ratio 21   12-28  ?Calcium 9.7   8.7-10.3  ?Carbon Dioxide, Total 19   20-29  ?Chloride 100   96-106  ?Creatinine 1.00   0.57-1.00  ?eGFR 58   >59  ?Globulin, Total 2.5   1.5-4.5  ?Glucose 132   70-99  ?Potassium 4.4   3.5-5.2  ?Protein, Total 7.2   6.0-8.5  ?Sodium 138   134-144  ? ?Lab Results  ?Component Value Date  ? BUN 21 01/04/2020  ? CREATININE 0.92 01/04/2020  ? ?She has a history of GERD. ? ?ROS: ?+ see HPI ? ?I reviewed pt's medications, allergies, PMH, social hx, family hx, and changes were documented in the history of present illness. Otherwise, unchanged from my initial visit note. ? ?Past Medical History:  ?Diagnosis Date  ? Anxiety   ? Arthritis   ? Chronic kidney disease   ? Coronary artery disease   ? nonobstructive by cardiac catheterization 2011 with a 30% LAD lesion  ? Depression   ? Diabetes ([place])   ? Type 2  ? GERD (gastroesophageal reflux disease)   ? Glaucoma   ? Hypercholesteremia   ? Hypertension   ? NASH (nonalcoholic steatohepatitis)   ? NASH  ? Pneumonia   ? 30 years ago  ? PVC (premature ventricular contraction)   ? Seasonal allergies   ? Sleep apnea   ? Spinal stenosis   ? ?Past Surgical History:  ?Procedure Laterality Date  ? ANTERIOR AND POSTERIOR VAGINAL REPAIR    ? APPENDECTOMY  1959  ? BLADDER SUSPENSION    ? BUNIONECTOMY    ? CARDIAC CATHETERIZATION  2011  ? clean cath  ? CARDIOVASCULAR STRESS TEST    ? CATARACT  EXTRACTION W/ INTRAOCULAR LENS IMPLANT Right 2010  ? KNEE ARTHROSCOPY Left   ? NASAL RECONSTRUCTION  1976  ? SHOULDER ARTHROSCOPY Left   ? TOTAL ABDOMINAL HYSTERECTOMY  1976  ? TOTAL KNEE ARTHROPLASTY Left 01/03/2020  ?

## 2021-06-18 NOTE — Patient Instructions (Signed)
Please stop at the lab. ? ?How Can I Prevent Falls? ?Men and women with osteoporosis need to take care not to fall down. Falls can break bones. Some reasons people fall are: ?Poor vision  ?Poor balance  ?Certain diseases that affect how you walk  ?Some types of medicine, such as sleeping pills.  ?Some tips to help prevent falls outdoors are: ?Use a cane or walker  ?Wear rubber-soled shoes so you don't slip  ?Walk on grass when sidewalks are slippery  ?In winter, put salt or kitty litter on icy sidewalks.  ?Some ways to help prevent falls indoors are: ?Keep rooms free of clutter, especially on floors  ?Use plastic or carpet runners on slippery floors  ?Wear low-heeled shoes that provide good support  ?Do not walk in socks, stockings, or slippers  ?Be sure carpets and area rugs have skid-proof backs or are tacked to the floor  ?Be sure stairs are well lit and have rails on both sides  ?Put grab bars on bathroom walls near tub, shower, and toilet  ?Use a rubber bath mat in the shower or tub  ?Keep a flashlight next to your bed  ?Use a sturdy step stool with a handrail and wide steps  ?Add more lights in rooms (and night lights) ?Buy a cordless phone to keep with you so that you don't have to rush to the phone       when it rings and so that you can call for help if you fall.  ? ?(adapted from http://www.niams.NightlifePreviews.se) ? ?Exercise for Strong Bones (from [employer]) ?There are two types of exercises that are important for building and maintaining bone density:  weight-bearing and muscle-strengthening exercises. ?Weight-bearing Exercises ?These exercises include activities that make you move against gravity while staying upright. Weight-bearing exercises can be high-impact or low-impact. ?High-impact weight-bearing exercises help build bones and keep them strong. If you have broken a bone due to osteoporosis or are at risk of breaking a bone, you may  need to avoid high-impact exercises. If you?re not sure, you should check with your healthcare provider. ?Examples of high-impact weight-bearing exercises are: ?Dancing ?Doing high-impact aerobics ?Hiking ?Jogging/running ?Jumping Rope ?Stair climbing ?Tennis ?Low-impact weight-bearing exercises can also help keep bones strong and are a safe alternative if you cannot do high-impact exercises. Examples of low-impact weight-bearing exercises are: ?Using elliptical training machines ?Doing low-impact aerobics ?Using stair-step machines ?Fast walking on a treadmill or outside ?Muscle-Strengthening Exercises ?These exercises include activities where you move your body, a weight or some other resistance against gravity. They are also known as resistance exercises and include: ?Lifting weights ?Using elastic exercise bands ?Using weight machines ?Lifting your own body weight ?Functional movements, such as standing and rising up on your toes ?Yoga and Pilates can also improve strength, balance and flexibility. However, certain positions may not be safe for people with osteoporosis or those at increased risk of broken bones. For example, exercises that have you bend forward may increase the chance of breaking a bone in the spine. A physical therapist should be able to help you learn which exercises are safe and appropriate for you. ?Non-Impact Exercises ?Non-impact exercises can help you to improve balance, posture and how well you move in everyday activities. These exercises can also help to increase muscle strength and decrease the risk of falls and broken bones. Some of these exercises include: ?Balance exercises that strengthen your legs and test your balance, such as Tai Chi, can decrease your risk of  falls. ?Posture exercises that improve your posture and reduce rounded or ?sloping? shoulders can help you decrease the chance of breaking a bone, especially in the spine. ?Functional exercises that improve how well you  move can help you with everyday activities and decrease your chance of falling and breaking a bone. For example, if you have trouble getting up from a chair or climbing stairs, you should do these activities as exercises. ?A physical therapist can teach you balance, posture and functional exercises. ?Starting a New Exercise Program ?If you haven?t exercised regularly for a while, check with your healthcare provider before beginning a new exercise program--particularly if you have health problems such as heart disease, diabetes or high blood pressure. If you?re at high risk of breaking a bone, you should work with a physical therapist to develop a safe exercise program. ?Once you have your healthcare provider?s approval, start slowly. If you?ve already broken bones in the spine because of osteoporosis, be very careful to avoid activities that require reaching down, bending forward, rapid twisting motions, heavy lifting and those that increase your chance of a fall. ?As you get started, your muscles may feel sore for a day or two after you exercise. If soreness lasts longer, you may be working too hard and need to ease up. Exercises should be done in a pain-free range of motion. ?How Much Exercise Do You Need? ?Weight-bearing exercises 30 minutes on most days of the week. Do a 30-minutesession or multiple sessions spread out throughout the day. The benefits to your bones are the same.   ?Muscle-strengthening exercises Two to three days per week. If you don?t have much time for strengthening/resistance training, do small amounts at a time. You can do just one body part each day. For example do arms one day, legs the next and trunk the next. You can also spread these exercises out during your normal day.  ?Balance, posture and functional exercises Every day or as often as needed. You may want to focus on one area more than the others. If you have fallen or lose your balance, spend time doing balance exercises. If you  are getting rounded shoulders, work more on posture exercises. If you have trouble climbing stairs or getting up from the couch, do more functional exercises. You can also perform these exercises at one time or spread them during your day. Work with a phyiscal therapist to learn the right exercises for you.  ? ? ? ?

## 2021-06-19 ENCOUNTER — Encounter: Payer: Self-pay | Admitting: Internal Medicine

## 2021-06-19 DIAGNOSIS — M810 Age-related osteoporosis without current pathological fracture: Secondary | ICD-10-CM | POA: Insufficient documentation

## 2021-06-19 DIAGNOSIS — M25511 Pain in right shoulder: Secondary | ICD-10-CM | POA: Diagnosis not present

## 2021-06-19 LAB — BASIC METABOLIC PANEL
BUN: 18 mg/dL (ref 6–23)
CO2: 20 mEq/L (ref 19–32)
Calcium: 9.6 mg/dL (ref 8.4–10.5)
Chloride: 102 mEq/L (ref 96–112)
Creatinine, Ser: 1 mg/dL (ref 0.40–1.20)
GFR: 54.18 mL/min — ABNORMAL LOW (ref >60.00)
Glucose, Bld: 128 mg/dL — ABNORMAL HIGH (ref 70–99)
Potassium: 4.1 mEq/L (ref 3.5–5.1)
Sodium: 137 mEq/L (ref 135–145)

## 2021-06-21 ENCOUNTER — Telehealth: Payer: Self-pay

## 2021-06-21 NOTE — Telephone Encounter (Signed)
Pt lvm requesting a PA for the [employer].

## 2021-06-21 NOTE — Telephone Encounter (Signed)
Called and lvm for pt advising PA team notified and we will follow up with determination.

## 2021-06-22 ENCOUNTER — Telehealth: Payer: Self-pay | Admitting: Pharmacy Technician

## 2021-06-22 NOTE — Telephone Encounter (Signed)
Dr. Cruzita Lederer, Juluis Rainier note:  Auth Submission: NO AUTH NEEDED Payer: MEDICARE Medication & CPT/J Code(s) submitted: Reclast (Zolendronic acid) J3489 Route of submission (phone, fax, portal): PHONE Auth type: Buy/Bill Units/visits requested: X1 DOSE Reference number:  Approval from: 06/22/21 to 01/22/22

## 2021-06-25 DIAGNOSIS — M48062 Spinal stenosis, lumbar region with neurogenic claudication: Secondary | ICD-10-CM | POA: Diagnosis not present

## 2021-06-26 ENCOUNTER — Encounter: Payer: Self-pay | Admitting: Internal Medicine

## 2021-06-26 ENCOUNTER — Telehealth: Payer: Self-pay

## 2021-06-26 ENCOUNTER — Other Ambulatory Visit (HOSPITAL_COMMUNITY): Payer: Self-pay

## 2021-06-26 NOTE — Telephone Encounter (Signed)
Pt called to report elevated blood sugar, post spinal steroid injection, blood sugar increased to 363 4 hours after the injection, please advise.  [phone]

## 2021-06-26 NOTE — Telephone Encounter (Signed)
T, Please ask her to take 2  glipizide XL tablet now and 1 before dinner if sugars remain high. Tomorrow a.m., if sugars are still high, also take 2 tabs.  However, if the sugars remain in the 300s, please let me know, since in that case, she will need insulin.  Please stay very well-hydrated.

## 2021-06-26 NOTE — Telephone Encounter (Signed)
-----   Message from Lauralyn Primes, [place] sent at 06/21/2021  2:01 PM EDT ----- Regarding: PA Pt advised PA was needed for the [facility]. Thank you.

## 2021-06-26 NOTE — Telephone Encounter (Signed)
Called and advised pt to take 2  glipizide XL tablet now and 1 before dinner if sugars remain high. Tomorrow a.m., if sugars are still high, also take 2 tabs.  However, if the sugars remain in the 300s, please let me know, since in that case, she will need insulin. Pt advised she understood and would contact if sugars stay high.

## 2021-06-27 DIAGNOSIS — H401222 Low-tension glaucoma, left eye, moderate stage: Secondary | ICD-10-CM | POA: Diagnosis not present

## 2021-06-27 DIAGNOSIS — H401213 Low-tension glaucoma, right eye, severe stage: Secondary | ICD-10-CM | POA: Diagnosis not present

## 2021-07-03 ENCOUNTER — Encounter: Payer: Self-pay | Admitting: Internal Medicine

## 2021-07-04 ENCOUNTER — Ambulatory Visit (INDEPENDENT_AMBULATORY_CARE_PROVIDER_SITE_OTHER): Payer: Medicare Other

## 2021-07-04 VITALS — BP 138/74 | HR 59 | Temp 97.7°F | Resp 18 | Ht 66.0 in | Wt 183.2 lb

## 2021-07-04 DIAGNOSIS — M810 Age-related osteoporosis without current pathological fracture: Secondary | ICD-10-CM | POA: Diagnosis not present

## 2021-07-04 DIAGNOSIS — M25511 Pain in right shoulder: Secondary | ICD-10-CM | POA: Diagnosis not present

## 2021-07-04 MED ORDER — SODIUM CHLORIDE 0.9 % IV SOLN: INTRAVENOUS | Status: DC

## 2021-07-04 MED ORDER — ACETAMINOPHEN 325 MG PO TABS: 650.0000 mg | ORAL_TABLET | Freq: Once | ORAL | Status: DC

## 2021-07-04 MED ORDER — DIPHENHYDRAMINE HCL 25 MG PO CAPS
25.0000 mg | ORAL_CAPSULE | Freq: Once | ORAL | Status: AC
  Administered 2021-07-04: 25 mg via ORAL
  Filled 2021-07-04: qty 1

## 2021-07-04 MED ORDER — ZOLEDRONIC ACID 5 MG/100ML IV SOLN
5.0000 mg | Freq: Once | INTRAVENOUS | Status: AC
  Administered 2021-07-04: 5 mg via INTRAVENOUS
  Filled 2021-07-04: qty 100

## 2021-07-04 NOTE — Progress Notes (Signed)
Diagnosis: Osteoporosis  Provider:  Marshell Garfinkel, MD  Procedure: Infusion  IV Type: Peripheral, IV Location: R Forearm  Reclast (Zolendronic Acid), Dose: 5 mg  Infusion Start Time: 3533  Infusion Stop Time: 1110  Post Infusion IV Care: Observation period completed and Peripheral IV Discontinued  Discharge: Condition: Good, Destination: Home . AVS provided to patient.   Performed by:  Cleophus Molt, RN

## 2021-07-04 NOTE — Patient Instructions (Signed)

## 2021-07-09 ENCOUNTER — Encounter: Payer: Self-pay | Admitting: Internal Medicine

## 2021-07-09 DIAGNOSIS — I10 Essential (primary) hypertension: Secondary | ICD-10-CM | POA: Diagnosis not present

## 2021-07-09 DIAGNOSIS — E1122 Type 2 diabetes mellitus with diabetic chronic kidney disease: Secondary | ICD-10-CM | POA: Diagnosis not present

## 2021-07-09 DIAGNOSIS — I2510 Atherosclerotic heart disease of native coronary artery without angina pectoris: Secondary | ICD-10-CM | POA: Diagnosis not present

## 2021-07-09 DIAGNOSIS — M810 Age-related osteoporosis without current pathological fracture: Secondary | ICD-10-CM | POA: Diagnosis not present

## 2021-07-09 DIAGNOSIS — N183 Chronic kidney disease, stage 3 unspecified: Secondary | ICD-10-CM | POA: Diagnosis not present

## 2021-07-09 DIAGNOSIS — E785 Hyperlipidemia, unspecified: Secondary | ICD-10-CM | POA: Diagnosis not present

## 2021-07-09 DIAGNOSIS — K219 Gastro-esophageal reflux disease without esophagitis: Secondary | ICD-10-CM | POA: Diagnosis not present

## 2021-07-10 ENCOUNTER — Ambulatory Visit: Payer: Medicare Other | Admitting: Internal Medicine

## 2021-07-11 DIAGNOSIS — Z85828 Personal history of other malignant neoplasm of skin: Secondary | ICD-10-CM | POA: Diagnosis not present

## 2021-07-11 DIAGNOSIS — C44329 Squamous cell carcinoma of skin of other parts of face: Secondary | ICD-10-CM | POA: Diagnosis not present

## 2021-07-16 DIAGNOSIS — M0609 Rheumatoid arthritis without rheumatoid factor, multiple sites: Secondary | ICD-10-CM | POA: Diagnosis not present

## 2021-07-17 DIAGNOSIS — Z09 Encounter for follow-up examination after completed treatment for conditions other than malignant neoplasm: Secondary | ICD-10-CM | POA: Diagnosis not present

## 2021-07-17 DIAGNOSIS — K580 Irritable bowel syndrome with diarrhea: Secondary | ICD-10-CM | POA: Diagnosis not present

## 2021-07-17 DIAGNOSIS — N390 Urinary tract infection, site not specified: Secondary | ICD-10-CM | POA: Diagnosis not present

## 2021-07-17 DIAGNOSIS — N644 Mastodynia: Secondary | ICD-10-CM | POA: Diagnosis not present

## 2021-07-18 DIAGNOSIS — B078 Other viral warts: Secondary | ICD-10-CM | POA: Diagnosis not present

## 2021-07-18 DIAGNOSIS — D485 Neoplasm of uncertain behavior of skin: Secondary | ICD-10-CM | POA: Diagnosis not present

## 2021-07-24 ENCOUNTER — Other Ambulatory Visit: Payer: Self-pay | Admitting: Family Medicine

## 2021-07-24 DIAGNOSIS — N644 Mastodynia: Secondary | ICD-10-CM

## 2021-08-06 DIAGNOSIS — M4316 Spondylolisthesis, lumbar region: Secondary | ICD-10-CM | POA: Diagnosis not present

## 2021-08-06 DIAGNOSIS — M48062 Spinal stenosis, lumbar region with neurogenic claudication: Secondary | ICD-10-CM | POA: Diagnosis not present

## 2021-08-06 DIAGNOSIS — Z6828 Body mass index (BMI) 28.0-28.9, adult: Secondary | ICD-10-CM | POA: Diagnosis not present

## 2021-08-20 ENCOUNTER — Ambulatory Visit
Admission: RE | Admit: 2021-08-20 | Discharge: 2021-08-20 | Disposition: A | Discharge status: Home / Self Care | Payer: Medicare Other | Source: Ambulatory Visit | Attending: Family Medicine | Admitting: Family Medicine

## 2021-08-20 DIAGNOSIS — N644 Mastodynia: Secondary | ICD-10-CM | POA: Diagnosis not present

## 2021-08-27 DIAGNOSIS — M25511 Pain in right shoulder: Secondary | ICD-10-CM | POA: Diagnosis not present

## 2021-08-28 DIAGNOSIS — Z79899 Other long term (current) drug therapy: Secondary | ICD-10-CM | POA: Diagnosis not present

## 2021-08-28 DIAGNOSIS — M0609 Rheumatoid arthritis without rheumatoid factor, multiple sites: Secondary | ICD-10-CM | POA: Diagnosis not present

## 2021-09-05 ENCOUNTER — Telehealth: Payer: Self-pay

## 2021-09-05 NOTE — Telephone Encounter (Signed)
Inbound fax from DME supplier requesting form be completed and faxed with clinical notes. DME supplies ordered via [employer] through online portal.

## 2021-09-06 DIAGNOSIS — M25511 Pain in right shoulder: Secondary | ICD-10-CM | POA: Diagnosis not present

## 2021-09-09 NOTE — Progress Notes (Signed)
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  =========================================================  09/11/20- [name] never smoker coming to re-establish after last here 5 yrs ago-followed for OSA, complicated by HBP, CAD, GERD/cough, DM2, Hyperlipidemia, Covid infection May 2022, Rheumatoid Arthritis, CPAP auto 8-15/ [employer]                   AirSense 10 AutoSet machine Download- compliance 100%, AHI 1.1/ hr,     Body weight today-171 lbs Covid vax-3 Phizer -----CPAP- is working fine, pt states she does not take a nap without it.   09/11/21-  [name] never smoker -followed for OSA, complicated by HBP, CAD, GERD/cough, DM2, Hyperlipidemia, Covid infection May 2022, Rheumatoid Arthritis, CPAP auto 8-15/ [employer]                   AirSense 10 AutoSet machine Download- compliance    60%, AHI 1.1/ hr Body weight today-176 lbs Covid vax-3 Phizer -----Pt f/u for OSA getting approx 8-10hrs/night of sleep. Pt is interested in getting a new machine Misses CPAP use when at [facility]. Sleeps better with CPAP.   ROS-see HPI Constitutional:   No-   weight loss, night sweats, fevers, chills, +fatigue, lassitude. HEENT:   No-  headaches, difficulty swallowing, tooth/dental problems, sore throat,  pain L jaw      No-  sneezing, itching, ear ache, +nasal congestion, post nasal drip,  CV:  No-   chest pain, orthopnea, PND, swelling in lower extremities, anasarca, dizziness, palpitations Resp:  shortness of breath with exertion or at rest.              productive cough,  non-productive cough,  No- coughing up of blood.              No-   change in color of mucus.  No- wheezing.   Skin: No-   rash or lesions. GI:  No-   heartburn, +indigestion, abdominal pain, nausea, vomiting, GU:  MS:  No-   joint pain or swelling. Neuro-     nothing unusual Psych:  No- change in mood  or affect. No depression or anxiety.  No memory loss.  OBJ- Physical Exam General- Alert, Oriented, Affect-appropriate, Distress- none acute. Medium build, looks well Skin- rash-none, lesions- none, excoriation- none Lymphadenopathy- none Head- atraumatic            Eyes- Gross vision intact, PERRLA, conjunctivae and secretions clear            Ears- Hearing, canals-normal.             Nose- Clear, + mild external deviation, +more narrow on the right,  No-mucus, polyps,  erosion, perforation             Throat- Mallampati II , mucosa clear , drainage- none, tonsils- atrophic. Teeth and                                 oropharynx seem ok. Neck- flexible , trachea midline, no stridor , thyroid nl, carotid no bruit Chest - symmetrical excursion , unlabored           Heart/CV- RRR , no murmur , no gallop  , no rub, nl s1 s2                           -  JVD- none , edema- none, stasis changes- none, varices- none           Lung- clear, unlabored, wheeze- none, cough-none , dullness-none, rub- none           Chest wall-  Abd-  Br/ Gen/ Rectal- Not done, not indicated Extrem- cyanosis- none, clubbing, none, atrophy- none, strength- nl Neuro- grossly intact to observation

## 2021-09-11 ENCOUNTER — Ambulatory Visit: Payer: Medicare Other | Admitting: Internal Medicine

## 2021-09-11 ENCOUNTER — Encounter: Payer: Self-pay | Admitting: Internal Medicine

## 2021-09-11 ENCOUNTER — Ambulatory Visit (INDEPENDENT_AMBULATORY_CARE_PROVIDER_SITE_OTHER): Payer: Medicare Other | Admitting: Internal Medicine

## 2021-09-11 VITALS — BP 102/60 | HR 71 | Ht 66.0 in | Wt 176.4 lb

## 2021-09-11 DIAGNOSIS — G4733 Obstructive sleep apnea (adult) (pediatric): Secondary | ICD-10-CM

## 2021-09-11 DIAGNOSIS — K219 Gastro-esophageal reflux disease without esophagitis: Secondary | ICD-10-CM | POA: Diagnosis not present

## 2021-09-11 NOTE — Patient Instructions (Signed)
Order- DME [employer]  please replace old CPAP machine, change to auto 8-15, mask of choice, humidifier, supplies, AirView/ card  Please call if we can help

## 2021-09-12 DIAGNOSIS — N390 Urinary tract infection, site not specified: Secondary | ICD-10-CM | POA: Diagnosis not present

## 2021-09-12 DIAGNOSIS — R102 Pelvic and perineal pain: Secondary | ICD-10-CM | POA: Diagnosis not present

## 2021-09-12 DIAGNOSIS — R300 Dysuria: Secondary | ICD-10-CM | POA: Diagnosis not present

## 2021-09-19 DIAGNOSIS — Z23 Encounter for immunization: Secondary | ICD-10-CM | POA: Diagnosis not present

## 2021-09-19 DIAGNOSIS — M25511 Pain in right shoulder: Secondary | ICD-10-CM | POA: Diagnosis not present

## 2021-09-19 DIAGNOSIS — M7541 Impingement syndrome of right shoulder: Secondary | ICD-10-CM | POA: Diagnosis not present

## 2021-09-26 NOTE — Telephone Encounter (Signed)
Email from DME supplier requesting form be completed and faxed with clinical notes. DME supplies ordered via [employer] through online portal.

## 2021-10-08 ENCOUNTER — Encounter: Payer: Self-pay | Admitting: Internal Medicine

## 2021-10-08 NOTE — Assessment & Plan Note (Signed)
Reflux precautions

## 2021-10-08 NOTE — Assessment & Plan Note (Signed)
Benefits from CPAP. Discussed compliance. Plan- replace old machine, auto 8-15

## 2021-10-16 DIAGNOSIS — M75111 Incomplete rotator cuff tear or rupture of right shoulder, not specified as traumatic: Secondary | ICD-10-CM | POA: Diagnosis not present

## 2021-10-16 DIAGNOSIS — M66821 Spontaneous rupture of other tendons, right upper arm: Secondary | ICD-10-CM | POA: Diagnosis not present

## 2021-10-16 DIAGNOSIS — G8918 Other acute postprocedural pain: Secondary | ICD-10-CM | POA: Diagnosis not present

## 2021-10-16 DIAGNOSIS — S43431A Superior glenoid labrum lesion of right shoulder, initial encounter: Secondary | ICD-10-CM | POA: Diagnosis not present

## 2021-10-16 DIAGNOSIS — M7541 Impingement syndrome of right shoulder: Secondary | ICD-10-CM | POA: Diagnosis not present

## 2021-10-16 DIAGNOSIS — M19011 Primary osteoarthritis, right shoulder: Secondary | ICD-10-CM | POA: Diagnosis not present

## 2021-10-16 DIAGNOSIS — M7521 Bicipital tendinitis, right shoulder: Secondary | ICD-10-CM | POA: Diagnosis not present

## 2021-10-16 HISTORY — PX: SHOULDER SURGERY: SHX246

## 2021-10-22 ENCOUNTER — Encounter: Payer: Self-pay | Admitting: Internal Medicine

## 2021-10-22 ENCOUNTER — Ambulatory Visit (INDEPENDENT_AMBULATORY_CARE_PROVIDER_SITE_OTHER): Payer: Medicare Other | Admitting: Internal Medicine

## 2021-10-22 VITALS — BP 130/72 | HR 71 | Ht 66.0 in | Wt 181.4 lb

## 2021-10-22 DIAGNOSIS — E1165 Type 2 diabetes mellitus with hyperglycemia: Secondary | ICD-10-CM

## 2021-10-22 DIAGNOSIS — E559 Vitamin D deficiency, unspecified: Secondary | ICD-10-CM

## 2021-10-22 DIAGNOSIS — E785 Hyperlipidemia, unspecified: Secondary | ICD-10-CM

## 2021-10-22 DIAGNOSIS — M810 Age-related osteoporosis without current pathological fracture: Secondary | ICD-10-CM

## 2021-10-22 LAB — POCT GLYCOSYLATED HEMOGLOBIN (HGB A1C): Hemoglobin A1C: 6.3 % — AB (ref 4.0–5.6)

## 2021-10-22 NOTE — Progress Notes (Signed)
Patient ID: Tammy Pineda, female   DOB: September 19, 1945, 76 y.o.   MRN: [id number]   HPI: Tammy Pineda is a 76 y.o.-year-old female, initially referred by her PCP, Dr. Drema Dallas, returning for follow-up for DM2, dx "several years ago", non-insulin-dependent, uncontrolled, with complications (CAD, CKD stage III, NASH) and clinical osteoporosis. Tammy Pineda, her husband, is also my pt. Last visit 4 months ago.  Interim history: She continues to have left knee pain after her TKR and also has right knee pain and knee weaknes.  She also has back pain, for which she is getting steroid injections.  On meloxicam, hydrocodone.  She continues to have a lot of stress at home with her husband having multiple medical problems and also taking care of her sister who has dementia. No blurry vision, nausea, chest pain.  She does have increased urination. Before last visit, she had a bone density scan that showed worsening of her T-scores. She had 1 dose of Reclast since last visit. She had rotator cuff surgery 1 week ago >> R arm in sling. Sugars are higher.   DM2: Reviewed HbA1c levels: Lab Results  Component Value Date   HGBA1C 6.7 (A) 06/18/2021   HGBA1C 6.5 (A) 03/15/2021   HGBA1C 6.5 (A) 11/30/2020   HGBA1C 6.1 (A) 07/27/2020   HGBA1C 6.1 (A) 04/18/2020   HGBA1C 6.7 (A) 08/11/2019   HGBA1C 6.9 (A) 05/03/2019   HGBA1C 6.4 (A) 02/08/2019   HGBA1C 5.9 10/08/2018   HGBA1C 6.2 (A) 08/04/2018  11/25/2019: HbA1c 6.5% 02/26/2018: HbA1c 6.5% 09/20/2017: HbA1c 7.3% 04/18/2017: HbA1c 7.6%  Previously on: - Metformin 1000 mg 2x a day, with meals -added 11/2017 - had significant diarrhea >> metformin ER 1000 mg 2x a day which she was tolerating well, however, she then started to have severe diarrhea >> she stopped in 09/2018. We tried [place] in the past but this was too expensive.  Currently on: - Glipizide XL 2.5 >> 5 >> 2.5 mg (decreased again b/c lows) before breakfast  >> 5 mg in am We tried Jardiance in  03/2021 and this was too expensive.  Pt checks her sugars more than 4 times a day with her newly acquired libre CGM (from [place] [facility]):   Prev.: - am: 110-139, 142 >> 88-142, 150 >> 121-148, 153 >> 109-142 >> 116-140s - 2h after b'fast:n/c >> 66, 150-241 >> n/c - before lunch: 68 >> n/c >> 125 >> n/c >> 122, 130 >> 84-146 >> n/c - 2h after lunch: 119, 150 >> n/c >> 164 >> n/c - before dinner:  n/c >> 57, 131, 138, 140 >> 96, 169 >> n/c - 2h after dinner: 125, 153 >> n/c >> 125 >> 162 >> n/c - bedtime: n/c >> 113, 117 >> n/c >> 183 >> 111-155 >> <188 - nighttime: n/c >> 98, 108 Lowest sugar was 52 before dinner (ate only oatmeal that day) >> 96 >> 84 >> 116 >> 100; it is unclear at which level she has hypoglycemia awareness. Highest sugar was 162 >> 224 (steroids) >> 155 >> 188 >> 200s  Glucometer: One Touch verio  Pt's meals are: - Breakfast: eggs, cereal + fruit, egg waffles and sugar-free syrup  - Lunch: salad, grilled chicken - Dinner: baked meat, veggies, starch  - Snacks: 1-2x - at bedtime - granola  In the past, she was exercising at [employer] and also dancing 2-3 times a week.  However, she stopped during the coronavirus pandemic.  [facility]  -+  CKD stage III, last BUN/creatinine:  Lab Results  Component Value Date   BUN 18 06/18/2021   BUN 21 01/04/2020   CREATININE 1.00 06/18/2021   CREATININE 0.92 01/04/2020  10/03/2020: 23/0.96, GR 61, Glu 181 On losartan 50.  -+ HL; last set of lipids: Lab Results  Component Value Date   CHOL 139 11/30/2020   HDL 42.70 11/30/2020   LDLCALC 70 12/22/2009   LDLDIRECT 59.0 11/30/2020   TRIG 278.0 (H) 11/30/2020   CHOLHDL 3 11/30/2020  11/25/2019: 151/268/43/65 On Crestor 5. I rec'd  fish oil 1000 mg daily after last visit >> tried >> indigestion.  - last eye exam was in 2023: + Glaucoma, no DR. Dr. Katy Fitch.  She has a history of cataract surgery.  - no numbness and tingling in her feet.  Foot exam  performed 03/2021.  Pt has FH of DM in father and brother.   At this visit, she also wants to address osteoporosis (OP):  I reviewed pt's DXA scans: Date L1-L4 T score FN T score 33% distal Radius Ultra distal radius  06/01/2021 N/a RFN: -2.1 LFN: -2.4 -1.7 (-3.4%) -1.5  12/25/2017 N/a  RFN: -0.9 LFN: -2.1 -1.5 -1.8  FRAX: MOF 24.2%, 10-year hip fracture risk 7.2%  She has the following fractures: Left wrist - in her 38s Skull - 01/2019 - tripped in the driveway  We started Reclast since last OV - 1st dose 07/04/2021.  She tolerated it well, without jaw/hip/thigh pain.  No dizziness/vertigo/orthostasis/poor vision.   No previous OP treatments.  No h/o vitamin D deficiency. Reviewed available vit D levels: Lab Results  Component Value Date   VD25OH 26.22 (L) 06/18/2021  10/29/2017: Vitamin D 35  Pt is on: - vitamin D  - 2000 >> 5000 units daily  No weight bearing exercises. R shoulder rotator cuff.   She does not take high vitamin A doses.  Menopause was at 76 y/o - postsurgical - HRT - stopped at 73.   FH of osteoporosis: sister - hip fracture.  No h/o persistent hyper/hypocalcemia or hyperparathyroidism. No h/o kidney stones. Lab Results  Component Value Date   CALCIUM 9.6 06/18/2021   CALCIUM 8.5 (L) 01/04/2020   CALCIUM 9.6 12/22/2019   CALCIUM 9.1 05/16/2019   CALCIUM 9.8 02/08/2019   CALCIUM 9.5 02/09/2012   CALCIUM 9.2 11/30/2009   CALCIUM 9.1 06/08/2008   CALCIUM 9.7 05/09/2006   No h/o thyrotoxicosis. Reviewed TSH recent levels:  Lab Results  Component Value Date   TSH 2.55 10/19/2009   TSH 1.55 05/09/2006   No CKD: Lab Results  Component Value Date   BUN 18 06/18/2021   CREATININE 1.00 06/18/2021   She has a history of GERD. She also has a history of HTN-on Lasix as needed, IBS with diarrhea, GERD, vaginal prolapse - h/o surgery. She also has NASH with stage 2 fibrosis-currently in a new study.  She was previously in another study that was  using a drug which helped her significantly (HbA1c dropped to 6.2%), however, the study was stopped and the drug is not available.  She sees Dr. Trudie Reed for RA.  On Remicade. She had left TKR 01/03/2020 - having a lot of pain after the surgery.  She also had a DVT in the L leg - on Xarelto x 3 mo. She has repeated UTIs in last 3 months.  ROS: + see HPI  I reviewed pt's medications, allergies, PMH, social hx, family hx, and changes were documented in the history of present  illness. Otherwise, unchanged from my initial visit note.  Past Medical History:  Diagnosis Date   Anxiety    Arthritis    Chronic kidney disease    Coronary artery disease    nonobstructive by cardiac catheterization 2011 with a 30% LAD lesion   Depression    Diabetes (HCC)    Type 2   GERD (gastroesophageal reflux disease)    Glaucoma    Hypercholesteremia    Hypertension    NASH (nonalcoholic steatohepatitis)    NASH   Pneumonia    30 years ago   PVC (premature ventricular contraction)    Seasonal allergies    Sleep apnea    Spinal stenosis    Past Surgical History:  Procedure Laterality Date   ANTERIOR AND POSTERIOR VAGINAL REPAIR     APPENDECTOMY  1959   BLADDER SUSPENSION     BUNIONECTOMY     CARDIAC CATHETERIZATION  2011   clean cath   CARDIOVASCULAR STRESS TEST     CATARACT EXTRACTION W/ INTRAOCULAR LENS IMPLANT Right 2010   KNEE ARTHROSCOPY Left    NASAL RECONSTRUCTION  1976   SHOULDER ARTHROSCOPY Left    TOTAL ABDOMINAL HYSTERECTOMY  1976   TOTAL KNEE ARTHROPLASTY Left 01/03/2020   Procedure: TOTAL KNEE ARTHROPLASTY;  Surgeon: Gaynelle Arabian, MD;  Location: [facility];  Service: Orthopedics;  Laterality: Left;  28mn   Social History   Socioeconomic History   Marital status: Married    Spouse name: Not on file   Number of children: 1   Years of education: Not on file   Highest education level: Not on file  Occupational History   Occupation: RTherapist, sports- retired    [employer] farm [employer]     Comment: [employer] Tobacco Use   Smoking status: Never Smoker   Smokeless tobacco: Never Used  Substance and Sexual Activity   Alcohol use: No   Drug use: No   Current Outpatient Medications on File Prior to Visit  Medication Sig Dispense Refill   ALPRAZolam (XANAX) 0.5 MG tablet Take 0.25 mg by mouth at bedtime as needed for anxiety.     ASPIRIN 81 PO Take 81 mg by mouth daily.     brimonidine (ALPHAGAN) 0.2 % ophthalmic solution Place 1 drop into both eyes 2 (two) times daily.     Cholecalciferol (VITAMIN D) 2000 UNITS CAPS Take 2,000 Units by mouth daily.      Cranberry-Vitamin C-Vitamin E (CRANBERRY PLUS VITAMIN C) 4200-20-3 MG-MG-UNIT CAPS Take by mouth.     escitalopram (LEXAPRO) 10 MG tablet Take 10 mg by mouth at bedtime.      glipiZIDE (GLUCOTROL XL) 2.5 MG 24 hr tablet Take 1 tablet (2.5 mg total) by mouth 2 (two) times daily before a meal. 180 tablet 3   glucose blood (ONETOUCH VERIO) test strip Use as instructed to check blood sugar 1X daily 300 each 3   hydrochlorothiazide (MICROZIDE) 12.5 MG capsule Take 12.5 mg by mouth every evening.      inFLIXimab (REMICADE) 100 MG injection Inject 100 mg into the vein every 6 (six) weeks.     latanoprost (XALATAN) 0.005 % ophthalmic solution Place 1 drop into both eyes at bedtime.     losartan (COZAAR) 50 MG tablet Take 50 mg by mouth at bedtime.     meclizine (ANTIVERT) 12.5 MG tablet Take 1 tablet (12.5 mg total) by mouth 3 (three) times daily as needed for dizziness. 21 tablet 0   meloxicam (MOBIC) 7.5  MG tablet Take 7.5 mg by mouth daily.     methocarbamol (ROBAXIN) 500 MG tablet Take 1 tablet (500 mg total) by mouth every 6 (six) hours as needed for muscle spasms. 40 tablet 0   Misc Natural Products (TURMERIC CURCUMIN) CAPS Take 1 capsule by mouth daily. 1500 mg/capsule     omeprazole (PRILOSEC) 20 MG capsule Take 20 mg by mouth daily as needed (acid reflux/indigestion.). Pt takes as needed.     rosuvastatin (CRESTOR) 5 MG  tablet Take 5 mg by mouth at bedtime.      timolol (TIMOPTIC) 0.5 % ophthalmic solution Place 1 drop into both eyes daily.     vitamin B-12 (CYANOCOBALAMIN) 1000 MCG tablet Take 1,000 mcg by mouth daily.     Current Facility-Administered Medications on File Prior to Visit  Medication Dose Route Frequency Provider Last Rate Last Admin   0.9 %  sodium chloride infusion   Intravenous PRN Kathrine Haddock, NP       regadenoson (LEXISCAN) injection SOLN 0.4 mg  0.4 mg Intravenous Once Buford Dresser, MD       technetium tetrofosmin (TC-MYOVIEW) injection 83.3 millicurie  82.5 millicurie Intravenous Once PRN Buford Dresser, MD       Allergies  Allergen Reactions   Metformin And Related Diarrhea   Gabapentin Palpitations   Plaquenil [Hydroxychloroquine] Nausea And Vomiting   Sulfamethoxazole-Trimethoprim Rash   Tape Rash   Family History  Problem Relation Age of Onset   Heart disease Father        open heart surgery at 18   Colon cancer Father    Heart disease Mother        open heart surgery at age 40   Hypertension Mother    Heart attack Maternal Grandfather    Stroke Paternal Grandmother    Heart block Brother    PE: There were no vitals taken for this visit.  Wt Readings from Last 3 Encounters:  09/11/21 176 lb 6.4 oz (80 kg)  07/04/21 183 lb 3.2 oz (83.1 kg)  06/18/21 182 lb 6.4 oz (82.7 kg)   Constitutional: overweight, in NAD Eyes:  EOMI, no exophthalmos ENT: no neck masses, no cervical lymphadenopathy Cardiovascular: RRR, No MRG Respiratory: CTA B Musculoskeletal: no deformities Skin:no rashes Neurological: no tremor with outstretched hands  ASSESSMENT: 1. DM2, non-insulin-dependent, uncontrolled, with long-term complications - CAD - CKD stage III - NASH  2. HL  3. OP  PLAN:  1. Patient with stenting, previously uncontrolled type 2 diabetes with improved control initially after starting [place] but unfortunately she had to come off the  medication due to price.  Vania Rea was also not affordable.  She could not tolerate metformin ER due to diarrhea.  She is on low-dose sulfonylurea.  At last visit HbA1c was only slightly higher, at 6.7%.  We did not change her regimen since the blood sugars were mainly at goal with occasional hyperglycemic values due to repeated UTIs and steroid injections.  Due to the UTIs, she may not be a good candidate for SGLT2 inhibitors anyway. CGM  interpretation: -At today's visit, we reviewed her CGM downloads: It appears that 84% of values are in target range (goal >70%), while still percent are higher than 180 (goal <25%), and 0% are lower than 70 (goal <4%).  The calculated average blood sugar is 149.  The projected HbA1c for the next 3 months (GMI) is 6.9%. -Reviewing the CGM trends, sugars have been mostly fluctuating in a narrow range within the target interval  overnight but they increase after breakfast and then again after dinner.  Upon questioning, she forgot to take 2.5 mg of glipizide before a larger dinner.  We will add this now.  In the morning, we discussed about how to improve breakfast to avoid hyperglycemic peaks afterwards.  She is eating white bread and I advised her to switch to whole-grain.  Most days she is eating oatmeal.  We discussed about trying to see what she can change and how she prepares her oatmeal to decrease her hyperglycemic peaks afterwards.  For now, I did not suggest a change in the morning glipizide.  The sugars in the last 2 weeks are probably higher due to her recent surgery last week.  The HbA1c is much better than expected, indicating most likely better control before the surgery. - I suggested to: Patient Instructions  Please stop at the lab.  Please continue: - Glipizide XL 5 mg daily before b'fast but can add Glipizide IR 2.5-5 mg before a larger dinner    Please try to change b'fast as discussed.  Please ask Dr. Jolinda Croak to also check a vitamin D level.  Please  return in 4 months with your sugar log.   - we checked her HbA1c: 6.3% (better)  - advised to check sugars at different times of the day - 4x a day, rotating check times - advised for yearly eye exams >> she is UTD - return to clinic in 4 months  2. HL -Reviewed latest lipid panel from 11/2020: LDL at goal, dry side psych: Lab Results  Component Value Date   CHOL 139 11/30/2020   HDL 42.70 11/30/2020   LDLCALC 70 12/22/2009   LDLDIRECT 59.0 11/30/2020   TRIG 278.0 (H) 11/30/2020   CHOLHDL 3 11/30/2020  -She is on Crestor 5 mg daily without side effects. I previously suggested to add fish oil 1000 mg daily and she tried this but it gave her indigestion and stopped.  3. OP -Likely postmenopausal/age-related and she also has family history of osteoporosis -Based on her bone density from earlier this year, she was at high risk for fracture based on the FRAX score -At last visit we discussed about fall precautions, weightbearing exercises, optimizing vitamin D levels, but we also started reflux. -She had 1 dose of Reclast 4 months ago.  She tolerates it well.  She gets this at the outpatient infusion center.  She also gets Remicade there.  She did decline daily injections at last visit. -For now, we will continue this for 3 years and then reevaluate -Plan to check another bone density in 3 years from the previous.  4.  Vitamin D insufficiency -At last visit, vitamin D was 26.22.  At that time I advised her to increase the dose of her vitamin D supplement to 4000 units daily. -We will recheck the level at next visit with PCP next month per her preference - she now sees the [facility] after Dr. Jacelyn Grip left the practice  Philemon Kingdom, MD PhD [facility] Endocrinology

## 2021-10-22 NOTE — Patient Instructions (Addendum)
Please stop at the lab.  Please continue: - Glipizide XL 5 mg daily before b'fast but can add Glipizide IR 2.5-5 mg before a larger dinner    Please try to change b'fast as discussed.  Please ask Dr. Jolinda Croak to also check a vitamin D level.  Please return in 4 months with your sugar log.

## 2021-10-26 DIAGNOSIS — M25511 Pain in right shoulder: Secondary | ICD-10-CM | POA: Diagnosis not present

## 2021-10-31 DIAGNOSIS — M25511 Pain in right shoulder: Secondary | ICD-10-CM | POA: Diagnosis not present

## 2021-11-05 DIAGNOSIS — M48062 Spinal stenosis, lumbar region with neurogenic claudication: Secondary | ICD-10-CM | POA: Diagnosis not present

## 2021-11-06 DIAGNOSIS — M25511 Pain in right shoulder: Secondary | ICD-10-CM | POA: Diagnosis not present

## 2021-11-13 DIAGNOSIS — M25511 Pain in right shoulder: Secondary | ICD-10-CM | POA: Diagnosis not present

## 2021-11-15 DIAGNOSIS — M25511 Pain in right shoulder: Secondary | ICD-10-CM | POA: Diagnosis not present

## 2021-11-16 DIAGNOSIS — N390 Urinary tract infection, site not specified: Secondary | ICD-10-CM | POA: Diagnosis not present

## 2021-11-16 DIAGNOSIS — E7800 Pure hypercholesterolemia, unspecified: Secondary | ICD-10-CM | POA: Diagnosis not present

## 2021-11-16 DIAGNOSIS — E559 Vitamin D deficiency, unspecified: Secondary | ICD-10-CM | POA: Diagnosis not present

## 2021-11-16 DIAGNOSIS — E1122 Type 2 diabetes mellitus with diabetic chronic kidney disease: Secondary | ICD-10-CM | POA: Diagnosis not present

## 2021-11-19 IMAGING — CT CT HEAD W/O CM
3 series · 14 of 47 positions shown, 16 images · non-contrast
Comparison: 11/29/2015

CLINICAL DATA: Ataxia, headache

EXAM:
CT HEAD WITHOUT CONTRAST
TECHNIQUE: Contiguous axial images were obtained from the base of the skull
through the vertex without intravenous contrast.

[Series 2: head wo · axial · 0.47mm/px · z∈[-122,+3]mm · 8 of 30 slices shown, 10 images]
[im 3/30  brain]
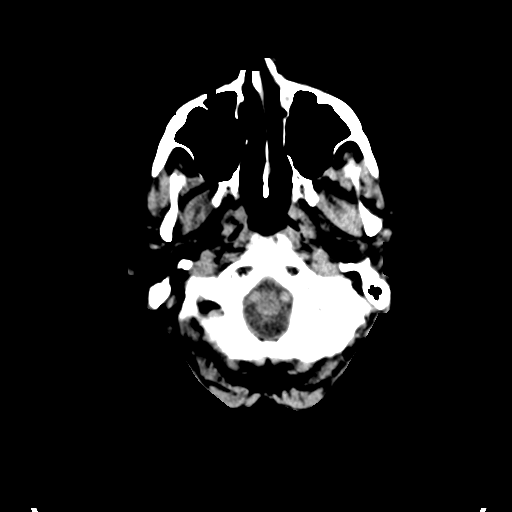
[im 3/30  bone]
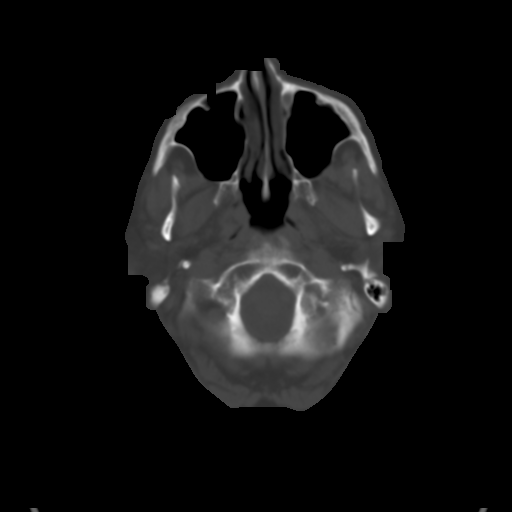
[im 7/30  brain]
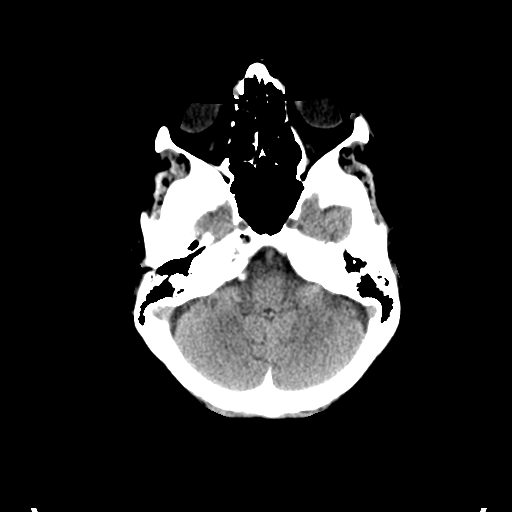
[im 10/30  brain]
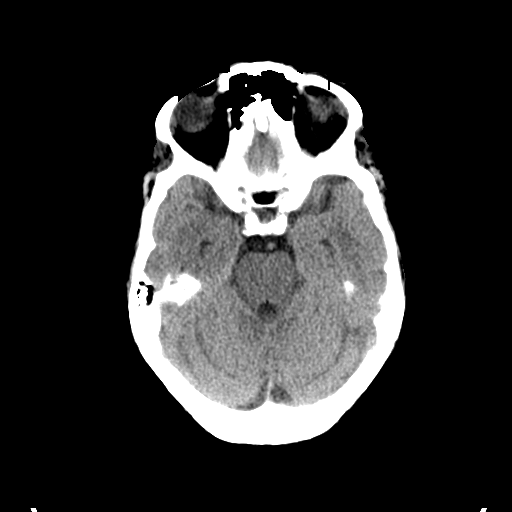
[im 14/30  brain]
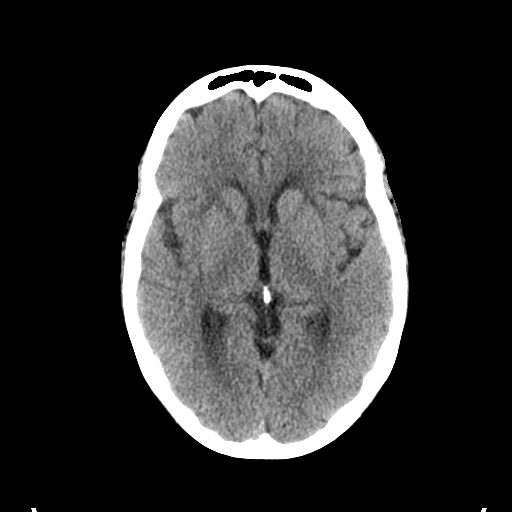
[im 17/30  brain]
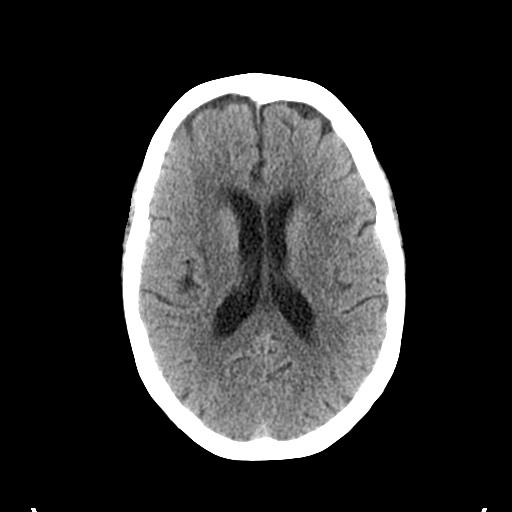
[im 17/30  bone]
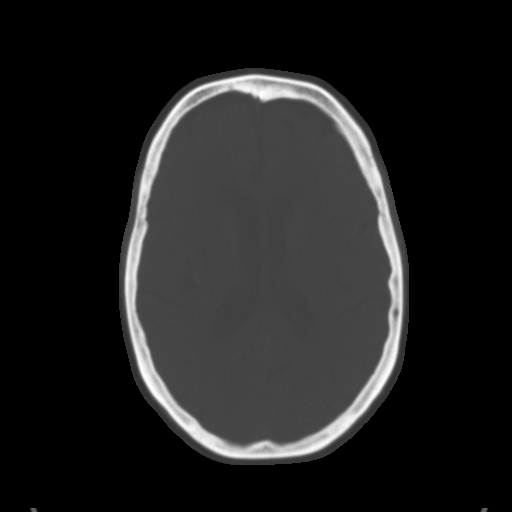
[im 21/30  brain]
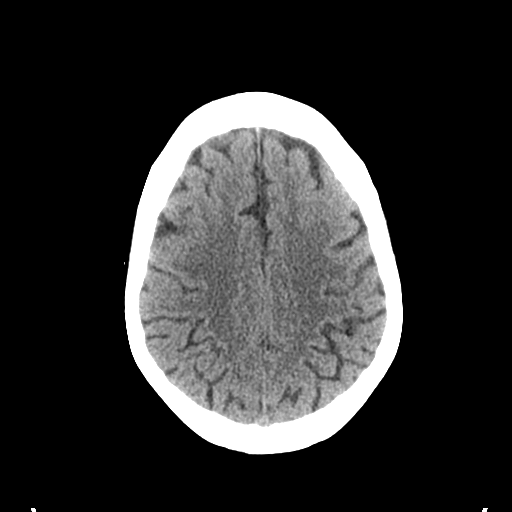
[im 24/30  brain]
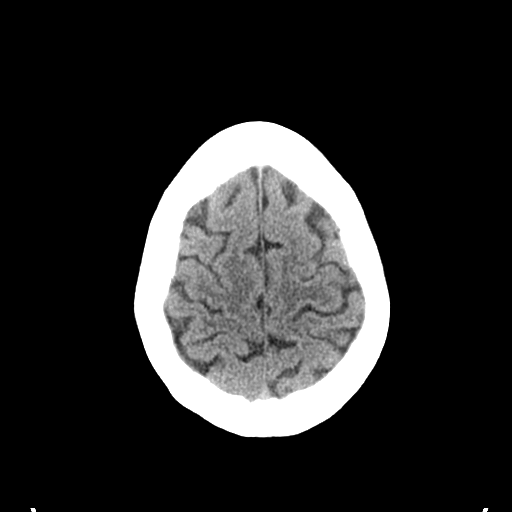
[im 28/30  brain]
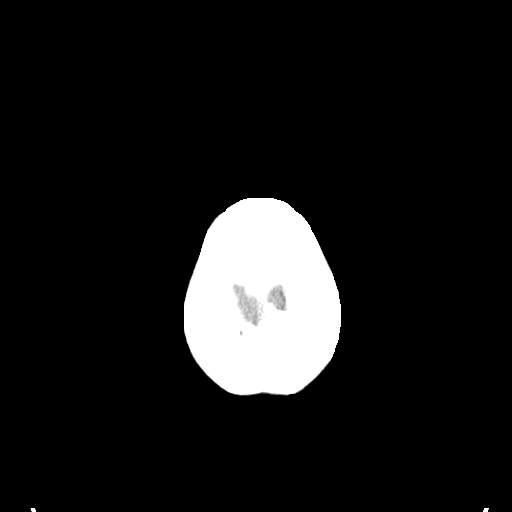

[Series 4: coronal soft tissue · coronal · 0.29mm/px · 3 of 62 slices shown]
[im 21/62  brain]
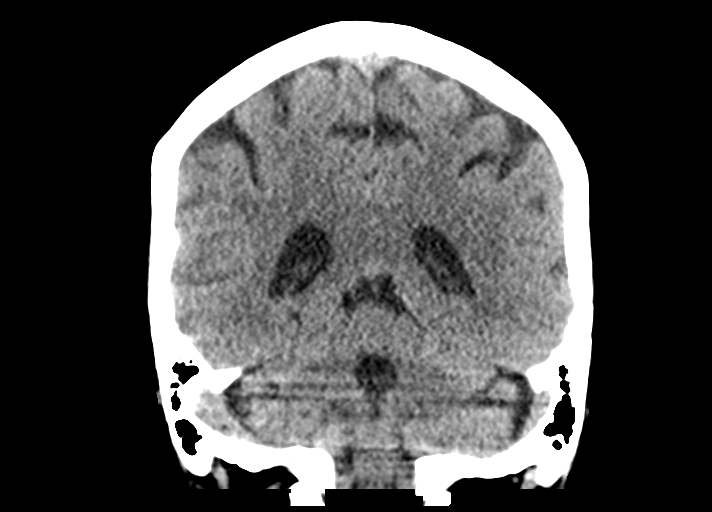
[im 28/62  brain]
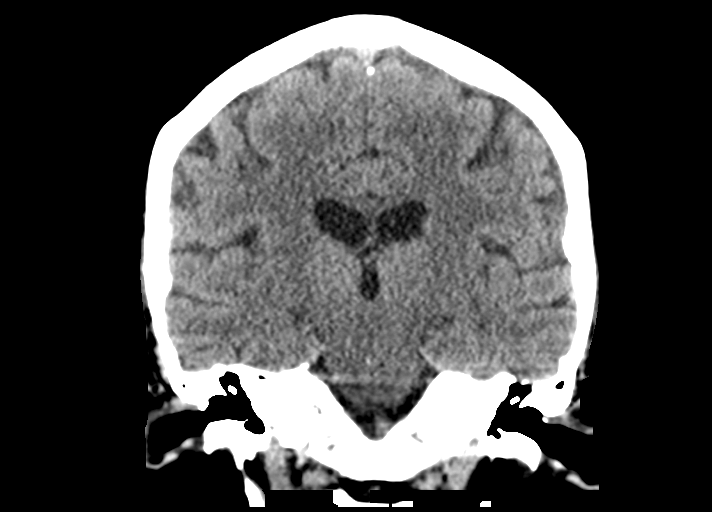
[im 34/62  brain]
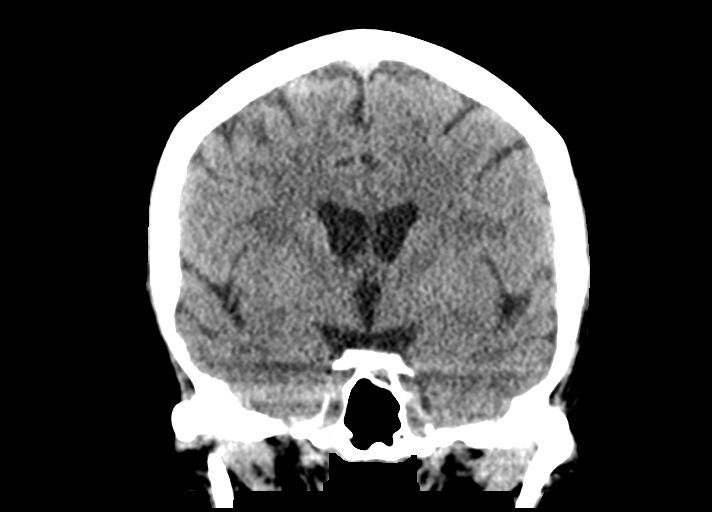

[Series 5: sagittal soft tissue · sagittal · 0.29mm/px · 3 of 46 slices shown]
[im 16/46  brain]
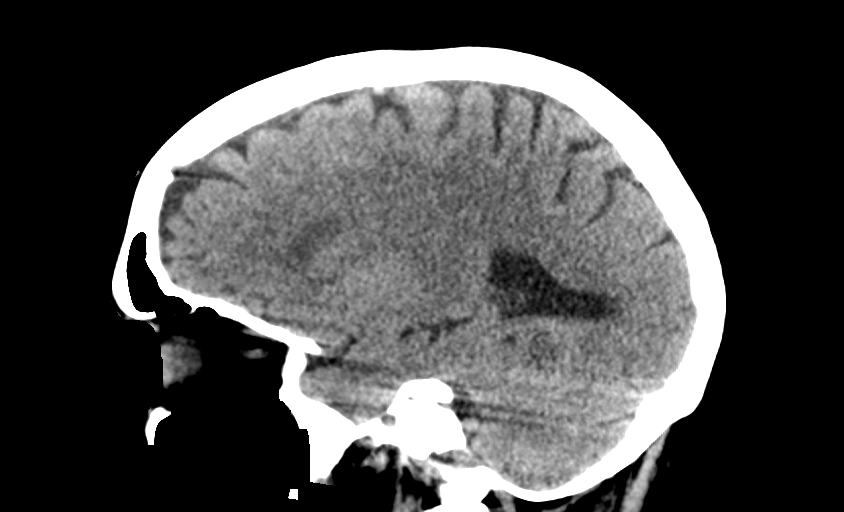
[im 23/46  brain]
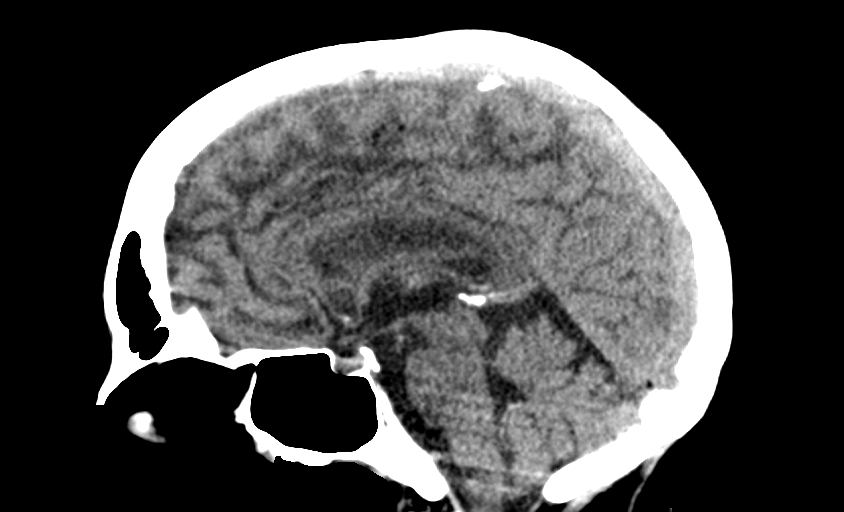
[im 31/46  brain]
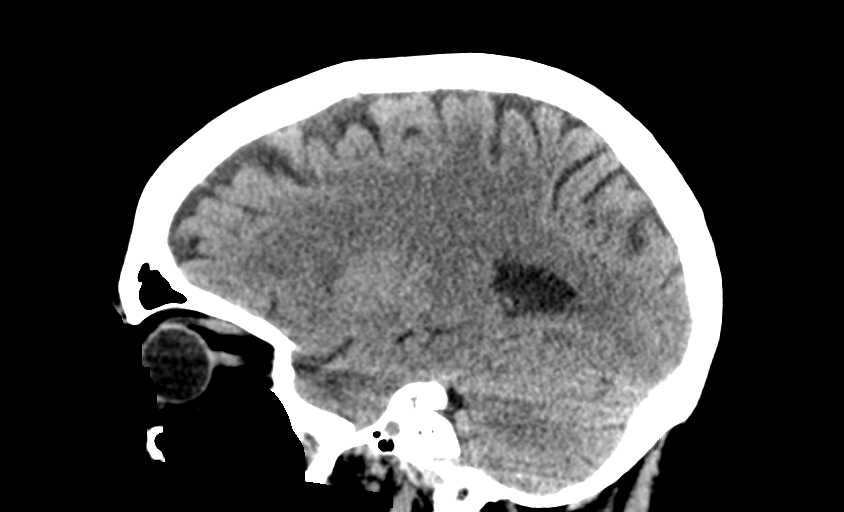

[14 of 47 positions shown; findings below may reference images not displayed]

FINDINGS: Brain: No evidence of acute infarction, hemorrhage, hydrocephalus,
extra-axial collection or mass lesion/mass effect. Scattered
low-density changes within the periventricular and subcortical white
matter compatible with chronic microvascular ischemic change. Mild
diffuse cerebral volume loss.

Vascular: Atherosclerotic calcifications involving the large vessels
of the skull base. No unexpected hyperdense vessel.

Skull: Normal. Negative for fracture or focal lesion.

Sinuses/Orbits: No acute finding.

Other: None.
IMPRESSION: 1.  No acute intracranial findings.

2.  Chronic microvascular ischemic change and cerebral volume loss.

## 2021-11-20 DIAGNOSIS — M25511 Pain in right shoulder: Secondary | ICD-10-CM | POA: Diagnosis not present

## 2021-11-23 DIAGNOSIS — M25511 Pain in right shoulder: Secondary | ICD-10-CM | POA: Diagnosis not present

## 2021-11-27 DIAGNOSIS — R748 Abnormal levels of other serum enzymes: Secondary | ICD-10-CM | POA: Diagnosis not present

## 2021-11-28 DIAGNOSIS — M1991 Primary osteoarthritis, unspecified site: Secondary | ICD-10-CM | POA: Diagnosis not present

## 2021-11-28 DIAGNOSIS — Z79899 Other long term (current) drug therapy: Secondary | ICD-10-CM | POA: Diagnosis not present

## 2021-11-28 DIAGNOSIS — M0609 Rheumatoid arthritis without rheumatoid factor, multiple sites: Secondary | ICD-10-CM | POA: Diagnosis not present

## 2021-11-28 DIAGNOSIS — Z6829 Body mass index (BMI) 29.0-29.9, adult: Secondary | ICD-10-CM | POA: Diagnosis not present

## 2021-11-28 DIAGNOSIS — M48061 Spinal stenosis, lumbar region without neurogenic claudication: Secondary | ICD-10-CM | POA: Diagnosis not present

## 2021-11-28 DIAGNOSIS — E663 Overweight: Secondary | ICD-10-CM | POA: Diagnosis not present

## 2021-11-30 DIAGNOSIS — M25511 Pain in right shoulder: Secondary | ICD-10-CM | POA: Diagnosis not present

## 2021-12-03 ENCOUNTER — Ambulatory Visit (HOSPITAL_COMMUNITY): Payer: Medicare Other | Admitting: Physician Assistant

## 2021-12-04 DIAGNOSIS — M25511 Pain in right shoulder: Secondary | ICD-10-CM | POA: Diagnosis not present

## 2021-12-05 ENCOUNTER — Other Ambulatory Visit: Payer: Self-pay

## 2021-12-05 DIAGNOSIS — E1165 Type 2 diabetes mellitus with hyperglycemia: Secondary | ICD-10-CM

## 2021-12-05 MED ORDER — FREESTYLE LIBRE 3 SENSOR MISC: 1.0000 | 1 refills | Status: DC

## 2021-12-07 DIAGNOSIS — M25511 Pain in right shoulder: Secondary | ICD-10-CM | POA: Diagnosis not present

## 2021-12-10 DIAGNOSIS — M25511 Pain in right shoulder: Secondary | ICD-10-CM | POA: Diagnosis not present

## 2021-12-11 ENCOUNTER — Other Ambulatory Visit (HOSPITAL_COMMUNITY): Payer: Self-pay

## 2021-12-11 ENCOUNTER — Telehealth: Payer: Self-pay | Admitting: Pharmacy Technician

## 2021-12-11 NOTE — Telephone Encounter (Signed)
Pharmacy Patient Advocate Encounter   Received notification from [place] that prior authorization for [facility] 3 is required/requested.  Per Test Claim: not covered by Medicare part D. May be covered under part B.

## 2021-12-11 NOTE — Telephone Encounter (Signed)
-----   Message from Lauralyn Primes, [place] sent at 12/10/2021  1:14 PM EST ----- Regarding: PA Pt advised her pharmacy request a PA for [employer] 3. Thank you.

## 2021-12-13 ENCOUNTER — Telehealth: Payer: Self-pay

## 2021-12-13 DIAGNOSIS — M25511 Pain in right shoulder: Secondary | ICD-10-CM | POA: Diagnosis not present

## 2021-12-13 NOTE — Telephone Encounter (Signed)
Inbound call from pt/DME supplier requesting form be completed and faxed with clinical notes. DME supplies ordered via [employer] through online portal.

## 2021-12-17 ENCOUNTER — Other Ambulatory Visit: Payer: Self-pay | Admitting: Internal Medicine

## 2021-12-20 DIAGNOSIS — M25511 Pain in right shoulder: Secondary | ICD-10-CM | POA: Diagnosis not present

## 2021-12-25 DIAGNOSIS — I10 Essential (primary) hypertension: Secondary | ICD-10-CM | POA: Diagnosis not present

## 2021-12-25 DIAGNOSIS — K580 Irritable bowel syndrome with diarrhea: Secondary | ICD-10-CM | POA: Diagnosis not present

## 2021-12-25 DIAGNOSIS — E7800 Pure hypercholesterolemia, unspecified: Secondary | ICD-10-CM | POA: Diagnosis not present

## 2021-12-25 DIAGNOSIS — E1122 Type 2 diabetes mellitus with diabetic chronic kidney disease: Secondary | ICD-10-CM | POA: Diagnosis not present

## 2021-12-25 DIAGNOSIS — Z0000 Encounter for general adult medical examination without abnormal findings: Secondary | ICD-10-CM | POA: Diagnosis not present

## 2022-01-01 DIAGNOSIS — M25511 Pain in right shoulder: Secondary | ICD-10-CM | POA: Diagnosis not present

## 2022-01-01 DIAGNOSIS — Z961 Presence of intraocular lens: Secondary | ICD-10-CM | POA: Diagnosis not present

## 2022-01-01 DIAGNOSIS — E119 Type 2 diabetes mellitus without complications: Secondary | ICD-10-CM | POA: Diagnosis not present

## 2022-01-01 DIAGNOSIS — H401222 Low-tension glaucoma, left eye, moderate stage: Secondary | ICD-10-CM | POA: Diagnosis not present

## 2022-01-03 DIAGNOSIS — M25511 Pain in right shoulder: Secondary | ICD-10-CM | POA: Diagnosis not present

## 2022-01-07 DIAGNOSIS — M25511 Pain in right shoulder: Secondary | ICD-10-CM | POA: Diagnosis not present

## 2022-01-09 DIAGNOSIS — R202 Paresthesia of skin: Secondary | ICD-10-CM | POA: Diagnosis not present

## 2022-01-09 DIAGNOSIS — M545 Low back pain, unspecified: Secondary | ICD-10-CM | POA: Diagnosis not present

## 2022-01-09 DIAGNOSIS — M48062 Spinal stenosis, lumbar region with neurogenic claudication: Secondary | ICD-10-CM | POA: Diagnosis not present

## 2022-01-10 DIAGNOSIS — M25511 Pain in right shoulder: Secondary | ICD-10-CM | POA: Diagnosis not present

## 2022-01-15 DIAGNOSIS — M25511 Pain in right shoulder: Secondary | ICD-10-CM | POA: Diagnosis not present

## 2022-01-17 DIAGNOSIS — M25511 Pain in right shoulder: Secondary | ICD-10-CM | POA: Diagnosis not present

## 2022-01-23 DIAGNOSIS — M4316 Spondylolisthesis, lumbar region: Secondary | ICD-10-CM | POA: Diagnosis not present

## 2022-01-23 DIAGNOSIS — M48062 Spinal stenosis, lumbar region with neurogenic claudication: Secondary | ICD-10-CM | POA: Diagnosis not present

## 2022-01-23 DIAGNOSIS — Z6829 Body mass index (BMI) 29.0-29.9, adult: Secondary | ICD-10-CM | POA: Diagnosis not present

## 2022-02-10 DIAGNOSIS — M25511 Pain in right shoulder: Secondary | ICD-10-CM | POA: Diagnosis not present

## 2022-02-13 DIAGNOSIS — Z4789 Encounter for other orthopedic aftercare: Secondary | ICD-10-CM | POA: Diagnosis not present

## 2022-02-13 DIAGNOSIS — M7501 Adhesive capsulitis of right shoulder: Secondary | ICD-10-CM | POA: Diagnosis not present

## 2022-02-13 DIAGNOSIS — M25511 Pain in right shoulder: Secondary | ICD-10-CM | POA: Diagnosis not present

## 2022-02-21 ENCOUNTER — Encounter: Payer: Self-pay | Admitting: Internal Medicine

## 2022-02-21 ENCOUNTER — Ambulatory Visit (INDEPENDENT_AMBULATORY_CARE_PROVIDER_SITE_OTHER): Payer: Medicare Other | Admitting: Internal Medicine

## 2022-02-21 VITALS — BP 120/62 | HR 81 | Ht 66.0 in | Wt 177.0 lb

## 2022-02-21 DIAGNOSIS — E559 Vitamin D deficiency, unspecified: Secondary | ICD-10-CM

## 2022-02-21 DIAGNOSIS — E1165 Type 2 diabetes mellitus with hyperglycemia: Secondary | ICD-10-CM

## 2022-02-21 DIAGNOSIS — M810 Age-related osteoporosis without current pathological fracture: Secondary | ICD-10-CM | POA: Diagnosis not present

## 2022-02-21 DIAGNOSIS — E785 Hyperlipidemia, unspecified: Secondary | ICD-10-CM | POA: Diagnosis not present

## 2022-02-21 LAB — POCT GLYCOSYLATED HEMOGLOBIN (HGB A1C): Hemoglobin A1C: 6.6 % — AB (ref 4.0–5.6)

## 2022-02-21 MED ORDER — GLIPIZIDE ER 2.5 MG PO TB24: ORAL_TABLET | ORAL | 3 refills | Status: DC

## 2022-02-21 MED ORDER — DEXCOM G7 SENSOR MISC: 3.0000 | 4 refills | Status: DC

## 2022-02-21 MED ORDER — DEXCOM G7 RECEIVER DEVI: 1.0000 | Freq: Once | 0 refills | Status: AC

## 2022-02-21 NOTE — Patient Instructions (Addendum)
Please continue: - Glipizide XL 2.5 mg daily before 2.5 mg before dinner  Please return in 4-6 months with your sugar log.

## 2022-02-21 NOTE — Progress Notes (Signed)
Patient ID: Tammy Pineda, female   DOB: March 06, 1945, 77 y.o.   MRN: [id number]   HPI: Tammy Pineda is a 77 y.o.-year-old female, initially referred by her PCP, Dr. Drema Dallas, returning for follow-up for DM2, dx "several years ago", non-insulin-dependent, uncontrolled, with complications (CAD, CKD stage III, NASH) and clinical osteoporosis. [name], her husband, is also my pt. Last visit 4 months ago.  Interim history: She continues to have left knee pain after her TKR and also has right knee pain and knee weaknes.  She also has back pain, for which she is getting steroid injections.  On meloxicam, hydrocodone.  She had shoulder surgery in 10/2021. Last steroid inj  - in shoulder - 2 weeks ago >> sugars 265. She continues to have a lot of stress at home with her husband having multiple medical problems and also taking care of her sister who has dementia. No blurry vision, nausea, chest pain.  She does have increased urination and occasional diarrhea.  DM2: Reviewed HbA1c levels: Lab Results  Component Value Date   HGBA1C 6.3 (A) 10/22/2021   HGBA1C 6.7 (A) 06/18/2021   HGBA1C 6.5 (A) 03/15/2021   HGBA1C 6.5 (A) 11/30/2020   HGBA1C 6.1 (A) 07/27/2020   HGBA1C 6.1 (A) 04/18/2020   HGBA1C 6.7 (A) 08/11/2019   HGBA1C 6.9 (A) 05/03/2019   HGBA1C 6.4 (A) 02/08/2019   HGBA1C 5.9 10/08/2018   HGBA1C 6.2 (A) 08/04/2018  11/25/2019: HbA1c 6.5% 02/26/2018: HbA1c 6.5% 09/20/2017: HbA1c 7.3% 04/18/2017: HbA1c 7.6%  Previously on: - Metformin 1000 mg 2x a day, with meals -added 11/2017 - had significant diarrhea >> metformin ER 1000 mg 2x a day which she was tolerating well, however, she then started to have severe diarrhea >> she stopped in 09/2018. We tried [place] in the past but this was too expensive.  Currently on: - Glipizide XL 2.5 >> 5 >> 2.5 mg (decreased again b/c lows) before breakfast  >> 5 >> 2.5 mg in am + 2.5-5 >> 2.5 mg before a larger dinner We tried Jardiance in 03/2021 and  this was too expensive.  Pt checks her sugars 2x a day (not on the CGM - not covered by her insurance anymore): - am: 81, 116-151, 245 (steroids) - before dinner: 102-115, 200, 242 (steroid) - bedtime: 140-160, 148-269 (steroid)  Previously:   Lowest sugar was 52 before dinner (ate only oatmeal that day) ...>> 100 >> 50; it is unclear at which level she has hypoglycemia awareness. Highest sugar was 155 >> 188 >> 200s >> 269 - steroids.  Glucometer: One Touch verio  Pt's meals are: - Breakfast: eggs, cereal + fruit, egg waffles and sugar-free syrup  - Lunch: salad, grilled chicken - Dinner: baked meat, veggies, starch  - Snacks: 1-2x - at bedtime - granola  In the past, she was exercising at [employer] and also dancing 2-3 times a week.  However, she stopped during the coronavirus pandemic.  [facility]  -+ CKD stage III, last BUN/creatinine:  Lab Results  Component Value Date   BUN 18 06/18/2021   BUN 21 01/04/2020   CREATININE 1.00 06/18/2021   CREATININE 0.92 01/04/2020  10/03/2020: 23/0.96, GR 61, Glu 181 On losartan 50.  -+ HL; last set of lipids available for review: Lab Results  Component Value Date   CHOL 139 11/30/2020   HDL 42.70 11/30/2020   LDLCALC 70 12/22/2009   LDLDIRECT 59.0 11/30/2020   TRIG 278.0 (H) 11/30/2020   CHOLHDL  3 11/30/2020  11/25/2019: 151/268/43/65 On Crestor 5. I rec'd  fish oil 1000 mg daily after last visit >> tried >> indigestion.  - last eye exam was in 2023: + Glaucoma, no DR. Dr. Katy Fitch.  She has a history of cataract surgery.  - no numbness and tingling in her feet.  Foot exam performed 03/2021.  Pt has FH of DM in father and brother.   Osteoporosis (OP):  I reviewed pt's DXA scans: Date L1-L4 T score FN T score 33% distal Radius Ultra distal radius  06/01/2021 N/a RFN: -2.1 LFN: -2.4 -1.7 (-3.4%) -1.5  12/25/2017 N/a  RFN: -0.9 LFN: -2.1 -1.5 -1.8  FRAX: MOF 24.2%, 10-year hip fracture risk  7.2%  She has the following fractures: Left wrist - in her 63s Skull - 01/2019 - tripped in the driveway  We started Reclast - 1st dose 07/04/2021.  She tolerated it well, without jaw/hip/thigh pain.  No dizziness/vertigo/orthostasis/poor vision.   No previous OP treatments.  No h/o vitamin D deficiency. Reviewed available vit D levels: Lab Results  Component Value Date   VD25OH 26.22 (L) 06/18/2021  10/29/2017: Vitamin D 35  Pt is on: - vitamin D  - 2000 >> 5000 units daily  No weight bearing exercises. R shoulder rotator cuff.   She does not take high vitamin A doses.  Menopause was at 77 y/o - postsurgical - HRT - stopped at 18.   FH of osteoporosis: sister - hip fracture.  No h/o persistent hyper/hypocalcemia or hyperparathyroidism. No h/o kidney stones. Lab Results  Component Value Date   CALCIUM 9.6 06/18/2021   CALCIUM 8.5 (L) 01/04/2020   CALCIUM 9.6 12/22/2019   CALCIUM 9.1 05/16/2019   CALCIUM 9.8 02/08/2019   CALCIUM 9.5 02/09/2012   CALCIUM 9.2 11/30/2009   CALCIUM 9.1 06/08/2008   CALCIUM 9.7 05/09/2006   No h/o thyrotoxicosis. Reviewed TSH recent levels:  Lab Results  Component Value Date   TSH 2.55 10/19/2009   TSH 1.55 05/09/2006   No CKD: Lab Results  Component Value Date   BUN 18 06/18/2021   CREATININE 1.00 06/18/2021   She has a history of GERD. She also has a history of HTN-on Lasix as needed, IBS with diarrhea, GERD, vaginal prolapse - h/o surgery. She also has NASH with stage 2 fibrosis-currently in a new study.  She was previously in another study that was using a drug which helped her significantly (HbA1c dropped to 6.2%), however, the study was stopped and the drug is not available.  She sees Dr. Trudie Reed for RA.  On Remicade. She had left TKR 01/03/2020 - having a lot of pain after the surgery.  She also had a DVT in the L leg - on Xarelto x 3 mo. She has repeated UTIs in last 3 months.  ROS: + see HPI  I reviewed pt's  medications, allergies, PMH, social hx, family hx, and changes were documented in the history of present illness. Otherwise, unchanged from my initial visit note.  Past Medical History:  Diagnosis Date   Anxiety    Arthritis    Chronic kidney disease    Coronary artery disease    nonobstructive by cardiac catheterization 2011 with a 30% LAD lesion   Depression    Diabetes (HCC)    Type 2   GERD (gastroesophageal reflux disease)    Glaucoma    Hypercholesteremia    Hypertension    NASH (nonalcoholic steatohepatitis)    NASH   Pneumonia    30  years ago   PVC (premature ventricular contraction)    Seasonal allergies    Sleep apnea    Spinal stenosis    Past Surgical History:  Procedure Laterality Date   ANTERIOR AND POSTERIOR VAGINAL REPAIR     APPENDECTOMY  1959   BLADDER SUSPENSION     BUNIONECTOMY     CARDIAC CATHETERIZATION  2011   clean cath   CARDIOVASCULAR STRESS TEST     CATARACT EXTRACTION W/ INTRAOCULAR LENS IMPLANT Right 2010   KNEE ARTHROSCOPY Left    NASAL RECONSTRUCTION  1976   SHOULDER ARTHROSCOPY Left    TOTAL ABDOMINAL HYSTERECTOMY  1976   TOTAL KNEE ARTHROPLASTY Left 01/03/2020   Procedure: TOTAL KNEE ARTHROPLASTY;  Surgeon: Gaynelle Arabian, MD;  Location: [facility];  Service: Orthopedics;  Laterality: Left;  72mn   Social History   Socioeconomic History   Marital status: Married    Spouse name: Not on file   Number of children: 1   Years of education: Not on file   Highest education level: Not on file  Occupational History   Occupation: RTherapist, sports- retired    [employer] farm manager [employer]     Comment: [place] Tobacco Use   Smoking status: Never Smoker   Smokeless tobacco: Never Used  Substance and Sexual Activity   Alcohol use: No   Drug use: No   Current Outpatient Medications on File Prior to Visit  Medication Sig Dispense Refill   ALPRAZolam (XANAX) 0.5 MG tablet Take 0.25 mg by mouth at bedtime as needed for anxiety.     ASPIRIN 81 PO Take 81 mg  by mouth daily.     brimonidine (ALPHAGAN) 0.2 % ophthalmic solution Place 1 drop into both eyes 2 (two) times daily.     Cholecalciferol (VITAMIN D) 2000 UNITS CAPS Take 2,000 Units by mouth daily.      Continuous Blood Gluc Sensor (FREESTYLE LIBRE 3 SENSOR) MISC 1 applicator by Does not apply route every 14 (fourteen) days. Place 1 sensor on the skin every 14 days. Use to check glucose continuously 6 each 1   Cranberry-Vitamin C-Vitamin E (CRANBERRY PLUS VITAMIN C) 4200-20-3 MG-MG-UNIT CAPS Take by mouth.     escitalopram (LEXAPRO) 10 MG tablet Take 10 mg by mouth at bedtime.      glipiZIDE (GLUCOTROL XL) 2.5 MG 24 hr tablet TAKE 2 TABLETS BY MOUTH DAILY BEFORE BREAKFAST 180 tablet 3   glucose blood (ONETOUCH VERIO) test strip Use as instructed to check blood sugar 1X daily 300 each 3   hydrochlorothiazide (MICROZIDE) 12.5 MG capsule Take 12.5 mg by mouth every evening.      inFLIXimab (REMICADE) 100 MG injection Inject 100 mg into the vein every 6 (six) weeks.     latanoprost (XALATAN) 0.005 % ophthalmic solution Place 1 drop into both eyes at bedtime.     losartan (COZAAR) 50 MG tablet Take 50 mg by mouth at bedtime.     meclizine (ANTIVERT) 12.5 MG tablet Take 1 tablet (12.5 mg total) by mouth 3 (three) times daily as needed for dizziness. 21 tablet 0   meloxicam (MOBIC) 7.5 MG tablet Take 7.5 mg by mouth daily.     methocarbamol (ROBAXIN) 500 MG tablet Take 1 tablet (500 mg total) by mouth every 6 (six) hours as needed for muscle spasms. 40 tablet 0   Misc Natural Products (TURMERIC CURCUMIN) CAPS Take 1 capsule by mouth daily. 1500 mg/capsule     omeprazole (PRILOSEC) 20 MG capsule Take  20 mg by mouth daily as needed (acid reflux/indigestion.). Pt takes as needed.     rosuvastatin (CRESTOR) 5 MG tablet Take 5 mg by mouth at bedtime.      timolol (TIMOPTIC) 0.5 % ophthalmic solution Place 1 drop into both eyes daily.     vitamin B-12 (CYANOCOBALAMIN) 1000 MCG tablet Take 1,000 mcg by mouth  daily.     Current Facility-Administered Medications on File Prior to Visit  Medication Dose Route Frequency Provider Last Rate Last Admin   0.9 %  sodium chloride infusion   Intravenous PRN Kathrine Haddock, NP       regadenoson (LEXISCAN) injection SOLN 0.4 mg  0.4 mg Intravenous Once Buford Dresser, MD       technetium tetrofosmin (TC-MYOVIEW) injection 54.9 millicurie  82.6 millicurie Intravenous Once PRN Buford Dresser, MD       Allergies  Allergen Reactions   Metformin And Related Diarrhea   Gabapentin Palpitations   Plaquenil [Hydroxychloroquine] Nausea And Vomiting   Sulfamethoxazole-Trimethoprim Rash   Tape Rash   Family History  Problem Relation Age of Onset   Heart disease Father        open heart surgery at 51   Colon cancer Father    Heart disease Mother        open heart surgery at age 29   Hypertension Mother    Heart attack Maternal Grandfather    Stroke Paternal Grandmother    Heart block Brother    PE: There were no vitals taken for this visit.  Wt Readings from Last 3 Encounters:  10/22/21 181 lb 6.4 oz (82.3 kg)  09/11/21 176 lb 6.4 oz (80 kg)  07/04/21 183 lb 3.2 oz (83.1 kg)   Constitutional: overweight, in NAD Eyes:  EOMI, no exophthalmos ENT: no neck masses, no cervical lymphadenopathy Cardiovascular: RRR, No MRG Respiratory: CTA B Musculoskeletal: no deformities Skin:no rashes Neurological: no tremor with outstretched hands  ASSESSMENT: 1. DM2, non-insulin-dependent, uncontrolled, with long-term complications - CAD - CKD stage III - NASH  2. HL  3. OP  PLAN:  1. Patient with previously uncontrolled type 2 diabetes, with improved control initially, after starting [place], but unfortunately she is not afford [place] or Jardiance afterwards.  Also, she has a history of UTIs so she may not be a good candidate for SGLT2 inhibitors. She could not tolerate metformin ER due to diarrhea.  She is on low-dose sulfonylurea.  At last  visit, sugars were mostly fluctuating within a narrow range in the target interval overnight but they were increasing after breakfast and then again after dinner.  I advised her to take glipizide IR 2.5-5 mg before a larger dinner.  We also discussed about how to improve breakfast.  HbA1c at that time was 6.3%, improved. -At today's visit, sugars are mostly at target but with hyperglycemic spikes, mostly around the time of her steroid injections.  At that time, she took more glipizide.  She did have an episode of low blood sugar, she believes that they have been approximately 50 (but did not check as she did not have the meter with her).  No lows otherwise.  We discussed about continuing the same dose of glipizide for now and she may increase the dose if she has steroid injections, but she always has to carry the meter with her.  However, I would strongly recommend to restart on the CGM and states that restarting her CGM was not covered, I sent a prescription for the Dexcom sensor to  her pharmacy. - I suggested to: Patient Instructions  Please continue: - Glipizide XL 2.5 mg daily before 2.5 mg before dinner  Please return in 4-6 months with your sugar log.   - we checked her HbA1c: 6.6% (higher) - advised to check sugars at different times of the day - 4x a day, rotating check times - advised for yearly eye exams >> she is UTD - return to clinic in  4-6 months  2. HL -Reviewed latest lipid panel from 11/2020: LDL at goal, triglycerides high: Lab Results  Component Value Date   CHOL 139 11/30/2020   HDL 42.70 11/30/2020   LDLCALC 70 12/22/2009   LDLDIRECT 59.0 11/30/2020   TRIG 278.0 (H) 11/30/2020   CHOLHDL 3 11/30/2020  -She continues on Crestor 5 mg daily without side effects. I previously suggested to add fish oil 1000 mg daily and she tried this but it gave her indigestion and stopped. -She had labs with PCP since last visit -she will send me the results  3. OP -Likely  postmenopausal/age-related and she also has family history of osteoporosis -Based on her bone density from in 2023, she was at high risk for fracture based on the FRAX score -We discussed about fall precautions and weightbearing exercises, optimizing vitamin D levels, and we also started Reclast.   -She had 1 dose of Reclast 06/2021.  She tolerated this well. -For now, we will continue this for 3 years and then reevaluate -Plan to check another bone density in 3 years from the previous  4.  Vitamin D insufficiency -She continues on 5000 units vitamin D daily -At last visit, she was planning to have another vitamin D level by PCP - she now sees the [facility] after Dr. Jacelyn Grip left the practice.  She had a visit with him but I do not have the lab results including the vitamin D.  She will have these sent to me.  Philemon Kingdom, MD PhD [facility]

## 2022-02-25 ENCOUNTER — Other Ambulatory Visit (HOSPITAL_COMMUNITY): Payer: Self-pay | Admitting: Family Medicine

## 2022-02-25 ENCOUNTER — Ambulatory Visit (HOSPITAL_COMMUNITY)
Admission: RE | Admit: 2022-02-25 | Discharge: 2022-02-25 | Disposition: A | Discharge status: Home / Self Care | Payer: Medicare Other | Source: Ambulatory Visit | Attending: Surgery | Admitting: Surgery

## 2022-02-25 DIAGNOSIS — M79652 Pain in left thigh: Secondary | ICD-10-CM

## 2022-02-25 DIAGNOSIS — E1122 Type 2 diabetes mellitus with diabetic chronic kidney disease: Secondary | ICD-10-CM | POA: Diagnosis not present

## 2022-02-25 DIAGNOSIS — Z6826 Body mass index (BMI) 26.0-26.9, adult: Secondary | ICD-10-CM | POA: Diagnosis not present

## 2022-02-25 DIAGNOSIS — K219 Gastro-esophageal reflux disease without esophagitis: Secondary | ICD-10-CM | POA: Diagnosis not present

## 2022-02-27 ENCOUNTER — Other Ambulatory Visit: Payer: Self-pay | Admitting: Neurological Surgery

## 2022-02-27 DIAGNOSIS — M4316 Spondylolisthesis, lumbar region: Secondary | ICD-10-CM | POA: Diagnosis not present

## 2022-03-06 NOTE — Progress Notes (Signed)
Surgical Instructions    Your procedure is scheduled on Thursday, 03/14/22.  Report to [facility] Main Entrance "A" at 9:00 A.M., then check in with the Admitting office.  Call this number if you have problems the morning of surgery:  [phone]   If you have any questions prior to your surgery date call [phone]: Open Monday-Friday 8am-4pm If you experience any cold or flu symptoms such as cough, fever, chills, shortness of breath, etc. between now and your scheduled surgery, please notify us at the above number     Remember:  Do not eat or drink after midnight the night before your surgery     Take these medicines the morning of surgery with A SIP OF WATER:  brimonidine (ALPHAGAN)  timolol (TIMOPTIC)   IF NEEDED: dicyclomine (BENTYL)  HYDROcodone-acetaminophen (NORCO/VICODIN)  meclizine (ANTIVERT)  methocarbamol (ROBAXIN)  omeprazole (PRILOSEC)   As of today, STOP taking any Aspirin (unless otherwise instructed by your surgeon) Aleve, Naproxen, Ibuprofen, Motrin, Advil, Goody's, BC's, all herbal medications, fish oil, and all vitamins.  WHAT DO I DO ABOUT MY DIABETES MEDICATION?   Do not take oral diabetes medicines (pills) the morning of surgery.      THE MORNING OF SURGERY, do not take glipiZIDE (GLUCOTROL XL).  The day of surgery, do not take other diabetes injectables, including Byetta (exenatide), Bydureon (exenatide ER), Victoza (liraglutide), or Trulicity (dulaglutide).  If your CBG is greater than 220 mg/dL, you may take  of your sliding scale (correction) dose of insulin.   HOW TO MANAGE YOUR DIABETES BEFORE AND AFTER SURGERY  Why is it important to control my blood sugar before and after surgery? Improving blood sugar levels before and after surgery helps healing and can limit problems. A way of improving blood sugar control is eating a healthy diet by:  Eating less sugar and carbohydrates  Increasing activity/exercise  Talking with your doctor about  reaching your blood sugar goals High blood sugars (greater than 180 mg/dL) can raise your risk of infections and slow your recovery, so you will need to focus on controlling your diabetes during the weeks before surgery. Make sure that the doctor who takes care of your diabetes knows about your planned surgery including the date and location.  How do I manage my blood sugar before surgery? Check your blood sugar at least 4 times a day, starting 2 days before surgery, to make sure that the level is not too high or low.  Check your blood sugar the morning of your surgery when you wake up and every 2 hours until you get to the Short Stay unit.  If your blood sugar is less than 70 mg/dL, you will need to treat for low blood sugar: Do not take insulin. Treat a low blood sugar (less than 70 mg/dL) with  cup of clear juice (cranberry or apple), 4 glucose tablets, OR glucose gel. Recheck blood sugar in 15 minutes after treatment (to make sure it is greater than 70 mg/dL). If your blood sugar is not greater than 70 mg/dL on recheck, call [phone] for further instructions. Report your blood sugar to the short stay nurse when you get to Short Stay.  If you are admitted to the hospital after surgery: Your blood sugar will be checked by the staff and you will probably be given insulin after surgery (instead of oral diabetes medicines) to make sure you have good blood sugar levels. The goal for blood sugar control after surgery is 80-180 mg/dL.  Do not wear jewelry or makeup. Do not wear lotions, powders, perfumes or deodorant. Do not shave 48 hours prior to surgery.   Do not bring valuables to the hospital. Do not wear nail polish, gel polish, artificial nails, or any other type of covering on natural nails (fingers and toes) If you have artificial nails or gel coating that need to be removed by a nail salon, please have this removed prior to surgery. Artificial nails or gel coating may  interfere with anesthesia's ability to adequately monitor your vital signs.  [facility] is not responsible for any belongings or valuables.    Do NOT Smoke (Tobacco/Vaping)  24 hours prior to your procedure  If you use a CPAP at night, you may bring your mask for your overnight stay.   Contacts, glasses, hearing aids, dentures or partials may not be worn into surgery, please bring cases for these belongings   For patients admitted to the hospital, discharge time will be determined by your treatment team.   Patients discharged the day of surgery will not be allowed to drive home, and someone needs to stay with them for 24 hours.   SURGICAL WAITING ROOM VISITATION Patients having surgery or a procedure may have no more than 2 support people in the waiting area - these visitors may rotate.   Children under the age of 31 must have an adult with them who is not the patient. If the patient needs to stay at the hospital during part of their recovery, the visitor guidelines for inpatient rooms apply. Pre-op nurse will coordinate an appropriate time for 1 support person to accompany patient in pre-op.  This support person may not rotate.   Please refer to [email or web address] for the visitor guidelines for Inpatients (after your surgery is over and you are in a regular room).    Special instructions:    Oral Hygiene is also important to reduce your risk of infection.  Remember - BRUSH YOUR TEETH THE MORNING OF SURGERY WITH YOUR REGULAR TOOTHPASTE   - Preparing For Surgery  Before surgery, you can play an important role. Because skin is not sterile, your skin needs to be as free of germs as possible. You can reduce the number of germs on your skin by washing with CHG (chlorahexidine gluconate) Soap before surgery.  CHG is an antiseptic cleaner which kills germs and bonds with the skin to continue killing germs even after washing.      Please do not use if you have an allergy to CHG or antibacterial soaps. If your skin becomes reddened/irritated stop using the CHG.  Do not shave (including legs and underarms) for at least 48 hours prior to first CHG shower. It is OK to shave your face.  Please follow these instructions carefully.     Shower the NIGHT BEFORE SURGERY and the MORNING OF SURGERY with CHG Soap.   If you chose to wash your hair, wash your hair first as usual with your normal shampoo. After you shampoo, rinse your hair and body thoroughly to remove the shampoo.  Then [employer] and genitals (private parts) with your normal soap and rinse thoroughly to remove soap.  After that Use CHG Soap as you would any other liquid soap. You can apply CHG directly to the skin and wash gently with a scrungie or a clean washcloth.   Apply the CHG Soap to your body ONLY FROM THE NECK DOWN.  Do not use on open wounds or open sores.  Avoid contact with your eyes, ears, mouth and genitals (private parts). Wash Face and genitals (private parts)  with your normal soap.   Wash thoroughly, paying special attention to the area where your surgery will be performed.  Thoroughly rinse your body with warm water from the neck down.  DO NOT shower/wash with your normal soap after using and rinsing off the CHG Soap.  Pat yourself dry with a CLEAN TOWEL.  Wear CLEAN PAJAMAS to bed the night before surgery  Place CLEAN SHEETS on your bed the night before your surgery  DO NOT SLEEP WITH PETS.   Day of Surgery: Take a shower with CHG soap. Wear Clean/Comfortable clothing the morning of surgery Do not apply any deodorants/lotions.   Remember to brush your teeth WITH YOUR REGULAR TOOTHPASTE.    If you received a COVID test during your pre-op visit, it is requested that you wear a mask when out in public, stay away from anyone that may not be feeling well, and notify your surgeon if you develop symptoms. If you have been in contact with  anyone that has tested positive in the last 10 days, please notify your surgeon.    Please read over the following fact sheets that you were given.

## 2022-03-07 ENCOUNTER — Inpatient Hospital Stay (HOSPITAL_COMMUNITY)
Admission: RE | Admit: 2022-03-07 | Discharge: 2022-03-07 | Disposition: A | Discharge status: Home / Self Care | Payer: Medicare Other | Source: Ambulatory Visit

## 2022-03-07 ENCOUNTER — Other Ambulatory Visit: Payer: Self-pay | Admitting: Neurological Surgery

## 2022-03-11 ENCOUNTER — Other Ambulatory Visit: Payer: Self-pay

## 2022-03-11 ENCOUNTER — Encounter (HOSPITAL_COMMUNITY): Payer: Self-pay | Admitting: *Deleted

## 2022-03-11 ENCOUNTER — Encounter (HOSPITAL_COMMUNITY)
Admission: RE | Admit: 2022-03-11 | Discharge: 2022-03-11 | Disposition: A | Discharge status: Home / Self Care | Payer: Medicare Other | Source: Ambulatory Visit | Attending: Neurological Surgery | Admitting: Neurological Surgery

## 2022-03-11 VITALS — BP 123/50 | HR 68 | Temp 97.9°F | Resp 18 | Ht 65.0 in | Wt 179.6 lb

## 2022-03-11 DIAGNOSIS — K7581 Nonalcoholic steatohepatitis (NASH): Secondary | ICD-10-CM | POA: Diagnosis not present

## 2022-03-11 DIAGNOSIS — Z01818 Encounter for other preprocedural examination: Secondary | ICD-10-CM | POA: Diagnosis not present

## 2022-03-11 DIAGNOSIS — E1165 Type 2 diabetes mellitus with hyperglycemia: Secondary | ICD-10-CM | POA: Insufficient documentation

## 2022-03-11 DIAGNOSIS — R9431 Abnormal electrocardiogram [ECG] [EKG]: Secondary | ICD-10-CM | POA: Insufficient documentation

## 2022-03-11 LAB — COMPREHENSIVE METABOLIC PANEL
ALT: 45 U/L — ABNORMAL HIGH (ref 0–44)
AST: 32 U/L (ref 15–41)
Albumin: 3.6 g/dL (ref 3.5–5.0)
Alkaline Phosphatase: 215 U/L — ABNORMAL HIGH (ref 38–126)
Anion gap: 9 (ref 5–15)
BUN: 16 mg/dL (ref 8–23)
CO2: 22 mmol/L (ref 22–32)
Calcium: 9 mg/dL (ref 8.9–10.3)
Chloride: 105 mmol/L (ref 98–111)
Creatinine, Ser: 0.93 mg/dL (ref 0.44–1.00)
GFR, Estimated: >60 mL/min (ref >60)
Glucose, Bld: 257 mg/dL — ABNORMAL HIGH (ref 70–99)
Potassium: 3.4 mmol/L — ABNORMAL LOW (ref 3.5–5.1)
Sodium: 136 mmol/L (ref 135–145)
Total Bilirubin: 0.3 mg/dL (ref 0.3–1.2)
Total Protein: 6.8 g/dL (ref 6.5–8.1)

## 2022-03-11 LAB — CBC
HCT: 36.8 % (ref 36.0–46.0)
Hemoglobin: 11.6 g/dL — ABNORMAL LOW (ref 12.0–15.0)
MCH: 29.3 pg (ref 26.0–34.0)
MCHC: 31.5 g/dL (ref 30.0–36.0)
MCV: 92.9 fL (ref 80.0–100.0)
Platelets: 205 10*3/uL (ref 150–400)
RBC: 3.96 MIL/uL (ref 3.87–5.11)
RDW: 13 % (ref 11.5–15.5)
WBC: 7.9 10*3/uL (ref 4.0–10.5)
nRBC: 0 % (ref 0.0–0.2)

## 2022-03-11 LAB — SURGICAL PCR SCREEN
MRSA, PCR: POSITIVE — AB
Staphylococcus aureus: POSITIVE — AB

## 2022-03-11 LAB — TYPE AND SCREEN
ABO/RH(D): A POS
Antibody Screen: NEGATIVE

## 2022-03-11 LAB — GLUCOSE, CAPILLARY: Glucose-Capillary: 208 mg/dL — ABNORMAL HIGH (ref 70–99)

## 2022-03-11 NOTE — Progress Notes (Signed)
PCP - Dr. Lurline Del Cardiologist - denies (Pt saw Dr. Margaretann Loveless in Nov. 2021 but says she does not remember any f/u after that)  PPM/ICD - denies  Chest x-ray - 10/08/18 EKG - 03/11/22 Stress Test - 09/15/20 ECHO - 01/03/14 Cardiac Cath - 2011  Sleep Study - 12/13/13 CPAP - nightly (10-13)  Fasting Blood Sugar - 110-140 Checks Blood Sugar 2-3 times a day  Last dose of GLP1 agonist-  n/a   ASA/Blood Thinner Instructions: n/a (Pt states she quit taking ASA 2 weeks ago 1/22)  ERAS Protcol - no, NPO  COVID TEST- n/a   Anesthesia review: yes, saw cardiology in 2021, no f/u  Patient denies shortness of breath, fever, cough and chest pain at PAT appointment   All instructions explained to the patient, with a verbal understanding of the material. Patient agrees to go over the instructions while at home for a better understanding. The opportunity to ask questions was provided.

## 2022-03-11 NOTE — Progress Notes (Signed)
Nasal PCR positive for MRSA/MSSA. Message left on voicemail for [facility], Dr [facility] surgery scheduler regarding this result.

## 2022-03-12 NOTE — Progress Notes (Signed)
Anesthesia Chart Review:  Patient evaluated by cardiology in July 2022 for throat pain.  She had prior cath in 2011 with mild nonobstructive CAD.  She has risk factors of hypertension, DM2, hypercholesterolemia, obesity.  Nuclear stress was ordered to rule out throat pain as an anginal equivalent.  Stress test 09/15/2020 was low risk, nonischemic.  As needed follow-up recommended.  OSA on CPAP.  Non-insulin-dependent DM2, A1c 6.6 on 02/21/2022.  Preop labs reviewed, mild anemia with hemoglobin 11.6, mild hypokalemia potassium 3.4, otherwise unremarkable.  EKG 03/11/2022: NSR.  Rate 61.  Nonspecific ST and T wave abnormality.  No significant change.  Nuclear stress 09/15/2020: The left ventricular ejection fraction is hyperdynamic (>65%). Nuclear stress EF: 67%. There was no ST segment deviation noted during stress. The study is normal. This is a low risk study.   Wynonia Musty [facility] Short Stay Center/Anesthesiology Phone [phone] 03/12/2022 1:32 PM

## 2022-03-12 NOTE — Anesthesia Preprocedure Evaluation (Signed)
Anesthesia Evaluation  Patient identified by MRN, date of birth, ID band Patient awake    Reviewed: Allergy & Precautions, H&P , NPO status , Patient's Chart, lab work & pertinent test results  Airway Mallampati: III  TM Distance: <3 FB Neck ROM: Full    Dental no notable dental hx. (+)    Pulmonary sleep apnea and Continuous Positive Airway Pressure Ventilation    Pulmonary exam normal breath sounds clear to auscultation       Cardiovascular hypertension, Normal cardiovascular exam Rhythm:Regular Rate:Normal     Neuro/Psych   Anxiety Depression    negative neurological ROS     GI/Hepatic ,GERD  ,,(+) Hepatitis -NASH   Endo/Other  diabetes, Type 2    Renal/GU Renal InsufficiencyRenal disease  negative genitourinary   Musculoskeletal negative musculoskeletal ROS (+)    Abdominal   Peds negative pediatric ROS (+)  Hematology negative hematology ROS (+)   Anesthesia Other Findings   Reproductive/Obstetrics negative OB ROS                             Anesthesia Physical Anesthesia Plan  ASA: 3  Anesthesia Plan: General   Post-op Pain Management: Ofirmev IV (intra-op)*   Induction: Intravenous  PONV Risk Score and Plan: 3 and Ondansetron, Dexamethasone and Treatment may vary due to age or medical condition  Airway Management Planned: Oral ETT  Additional Equipment:   Intra-op Plan:   Post-operative Plan: Extubation in OR  Informed Consent: I have reviewed the patients History and Physical, chart, labs and discussed the procedure including the risks, benefits and alternatives for the proposed anesthesia with the patient or authorized representative who has indicated his/her understanding and acceptance.     Dental advisory given  Plan Discussed with: CRNA and Surgeon  Anesthesia Plan Comments: (PAT note by Karoline Caldwell, PA-C: Patient evaluated by cardiology in July 2022 for  throat pain.  She had prior cath in 2011 with mild nonobstructive CAD.  She has risk factors of hypertension, DM2, hypercholesterolemia, obesity.  Nuclear stress was ordered to rule out throat pain as an anginal equivalent.  Stress test 09/15/2020 was low risk, nonischemic.  As needed follow-up recommended.  OSA on CPAP.  Non-insulin-dependent DM2, A1c 6.6 on 02/21/2022.  Preop labs reviewed, mild anemia with hemoglobin 11.6, mild hypokalemia potassium 3.4, otherwise unremarkable.  EKG 03/11/2022: NSR.  Rate 61.  Nonspecific ST and T wave abnormality.  No significant change.  Nuclear stress 09/15/2020: ? The left ventricular ejection fraction is hyperdynamic (>65%). ? Nuclear stress EF: 67%. ? There was no ST segment deviation noted during stress. ? The study is normal. ? This is a low risk study.   )        Anesthesia Quick Evaluation

## 2022-03-14 ENCOUNTER — Encounter (HOSPITAL_COMMUNITY)
Admission: RE | Disposition: A | Discharge status: Rehabilitation Facility | Payer: Self-pay | Source: Home / Self Care | Attending: Neurological Surgery

## 2022-03-14 ENCOUNTER — Inpatient Hospital Stay (HOSPITAL_COMMUNITY)
Admission: RE | Admit: 2022-03-14 | Discharge: 2022-03-20 | DRG: 455 | Disposition: A | Discharge status: Rehabilitation Facility | Payer: Medicare Other | Attending: Neurological Surgery | Admitting: Neurological Surgery

## 2022-03-14 ENCOUNTER — Ambulatory Visit (HOSPITAL_COMMUNITY): Payer: Medicare Other | Admitting: Physician Assistant

## 2022-03-14 ENCOUNTER — Other Ambulatory Visit: Payer: Self-pay

## 2022-03-14 ENCOUNTER — Ambulatory Visit (HOSPITAL_COMMUNITY): Payer: Medicare Other

## 2022-03-14 ENCOUNTER — Ambulatory Visit (HOSPITAL_BASED_OUTPATIENT_CLINIC_OR_DEPARTMENT_OTHER): Payer: Medicare Other | Admitting: Physician Assistant

## 2022-03-14 ENCOUNTER — Encounter (HOSPITAL_COMMUNITY): Payer: Self-pay | Admitting: Neurological Surgery

## 2022-03-14 DIAGNOSIS — E119 Type 2 diabetes mellitus without complications: Secondary | ICD-10-CM

## 2022-03-14 DIAGNOSIS — Z79899 Other long term (current) drug therapy: Secondary | ICD-10-CM | POA: Diagnosis not present

## 2022-03-14 DIAGNOSIS — N390 Urinary tract infection, site not specified: Secondary | ICD-10-CM | POA: Diagnosis not present

## 2022-03-14 DIAGNOSIS — Z888 Allergy status to other drugs, medicaments and biological substances status: Secondary | ICD-10-CM

## 2022-03-14 DIAGNOSIS — I10 Essential (primary) hypertension: Secondary | ICD-10-CM

## 2022-03-14 DIAGNOSIS — M5416 Radiculopathy, lumbar region: Secondary | ICD-10-CM | POA: Diagnosis not present

## 2022-03-14 DIAGNOSIS — Z6829 Body mass index (BMI) 29.0-29.9, adult: Secondary | ICD-10-CM

## 2022-03-14 DIAGNOSIS — Z800 Family history of malignant neoplasm of digestive organs: Secondary | ICD-10-CM

## 2022-03-14 DIAGNOSIS — M4316 Spondylolisthesis, lumbar region: Principal | ICD-10-CM | POA: Diagnosis present

## 2022-03-14 DIAGNOSIS — Z87898 Personal history of other specified conditions: Secondary | ICD-10-CM

## 2022-03-14 DIAGNOSIS — E7800 Pure hypercholesterolemia, unspecified: Secondary | ICD-10-CM | POA: Diagnosis present

## 2022-03-14 DIAGNOSIS — Z7982 Long term (current) use of aspirin: Secondary | ICD-10-CM

## 2022-03-14 DIAGNOSIS — E1165 Type 2 diabetes mellitus with hyperglycemia: Secondary | ICD-10-CM

## 2022-03-14 DIAGNOSIS — Z90710 Acquired absence of both cervix and uterus: Secondary | ICD-10-CM | POA: Diagnosis not present

## 2022-03-14 DIAGNOSIS — Z823 Family history of stroke: Secondary | ICD-10-CM | POA: Diagnosis not present

## 2022-03-14 DIAGNOSIS — Z4789 Encounter for other orthopedic aftercare: Secondary | ICD-10-CM | POA: Diagnosis present

## 2022-03-14 DIAGNOSIS — I82451 Acute embolism and thrombosis of right peroneal vein: Secondary | ICD-10-CM | POA: Diagnosis not present

## 2022-03-14 DIAGNOSIS — Z885 Allergy status to narcotic agent status: Secondary | ICD-10-CM

## 2022-03-14 DIAGNOSIS — R339 Retention of urine, unspecified: Secondary | ICD-10-CM | POA: Diagnosis not present

## 2022-03-14 DIAGNOSIS — F419 Anxiety disorder, unspecified: Secondary | ICD-10-CM | POA: Diagnosis present

## 2022-03-14 DIAGNOSIS — G4733 Obstructive sleep apnea (adult) (pediatric): Secondary | ICD-10-CM | POA: Diagnosis present

## 2022-03-14 DIAGNOSIS — F418 Other specified anxiety disorders: Secondary | ICD-10-CM

## 2022-03-14 DIAGNOSIS — K5900 Constipation, unspecified: Secondary | ICD-10-CM | POA: Diagnosis not present

## 2022-03-14 DIAGNOSIS — E876 Hypokalemia: Secondary | ICD-10-CM | POA: Diagnosis present

## 2022-03-14 DIAGNOSIS — I2510 Atherosclerotic heart disease of native coronary artery without angina pectoris: Secondary | ICD-10-CM | POA: Diagnosis present

## 2022-03-14 DIAGNOSIS — Z96652 Presence of left artificial knee joint: Secondary | ICD-10-CM | POA: Diagnosis present

## 2022-03-14 DIAGNOSIS — Z9989 Dependence on other enabling machines and devices: Secondary | ICD-10-CM | POA: Diagnosis not present

## 2022-03-14 DIAGNOSIS — Z8249 Family history of ischemic heart disease and other diseases of the circulatory system: Secondary | ICD-10-CM

## 2022-03-14 DIAGNOSIS — F32A Depression, unspecified: Secondary | ICD-10-CM | POA: Diagnosis present

## 2022-03-14 DIAGNOSIS — M5116 Intervertebral disc disorders with radiculopathy, lumbar region: Secondary | ICD-10-CM | POA: Diagnosis present

## 2022-03-14 DIAGNOSIS — E871 Hypo-osmolality and hyponatremia: Secondary | ICD-10-CM | POA: Diagnosis present

## 2022-03-14 DIAGNOSIS — M7989 Other specified soft tissue disorders: Secondary | ICD-10-CM | POA: Diagnosis not present

## 2022-03-14 DIAGNOSIS — I129 Hypertensive chronic kidney disease with stage 1 through stage 4 chronic kidney disease, or unspecified chronic kidney disease: Secondary | ICD-10-CM | POA: Diagnosis present

## 2022-03-14 DIAGNOSIS — R5381 Other malaise: Secondary | ICD-10-CM | POA: Diagnosis present

## 2022-03-14 DIAGNOSIS — Z882 Allergy status to sulfonamides status: Secondary | ICD-10-CM | POA: Diagnosis not present

## 2022-03-14 DIAGNOSIS — Z9841 Cataract extraction status, right eye: Secondary | ICD-10-CM

## 2022-03-14 DIAGNOSIS — N3000 Acute cystitis without hematuria: Secondary | ICD-10-CM | POA: Diagnosis not present

## 2022-03-14 DIAGNOSIS — E1169 Type 2 diabetes mellitus with other specified complication: Secondary | ICD-10-CM | POA: Diagnosis not present

## 2022-03-14 DIAGNOSIS — G8929 Other chronic pain: Secondary | ICD-10-CM | POA: Diagnosis present

## 2022-03-14 DIAGNOSIS — E669 Obesity, unspecified: Secondary | ICD-10-CM | POA: Diagnosis present

## 2022-03-14 DIAGNOSIS — Z961 Presence of intraocular lens: Secondary | ICD-10-CM | POA: Diagnosis present

## 2022-03-14 DIAGNOSIS — N182 Chronic kidney disease, stage 2 (mild): Secondary | ICD-10-CM | POA: Diagnosis present

## 2022-03-14 DIAGNOSIS — D649 Anemia, unspecified: Secondary | ICD-10-CM | POA: Diagnosis present

## 2022-03-14 DIAGNOSIS — B3731 Acute candidiasis of vulva and vagina: Secondary | ICD-10-CM | POA: Diagnosis not present

## 2022-03-14 DIAGNOSIS — M25511 Pain in right shoulder: Secondary | ICD-10-CM | POA: Diagnosis not present

## 2022-03-14 DIAGNOSIS — M4726 Other spondylosis with radiculopathy, lumbar region: Secondary | ICD-10-CM | POA: Diagnosis present

## 2022-03-14 DIAGNOSIS — Z7984 Long term (current) use of oral hypoglycemic drugs: Secondary | ICD-10-CM

## 2022-03-14 DIAGNOSIS — E1122 Type 2 diabetes mellitus with diabetic chronic kidney disease: Secondary | ICD-10-CM | POA: Diagnosis present

## 2022-03-14 DIAGNOSIS — E440 Moderate protein-calorie malnutrition: Secondary | ICD-10-CM | POA: Diagnosis present

## 2022-03-14 DIAGNOSIS — M48061 Spinal stenosis, lumbar region without neurogenic claudication: Secondary | ICD-10-CM | POA: Diagnosis present

## 2022-03-14 DIAGNOSIS — Z741 Need for assistance with personal care: Secondary | ICD-10-CM | POA: Diagnosis present

## 2022-03-14 DIAGNOSIS — K219 Gastro-esophageal reflux disease without esophagitis: Secondary | ICD-10-CM | POA: Diagnosis present

## 2022-03-14 DIAGNOSIS — Z981 Arthrodesis status: Secondary | ICD-10-CM | POA: Diagnosis not present

## 2022-03-14 DIAGNOSIS — K7581 Nonalcoholic steatohepatitis (NASH): Secondary | ICD-10-CM | POA: Diagnosis present

## 2022-03-14 DIAGNOSIS — H409 Unspecified glaucoma: Secondary | ICD-10-CM | POA: Diagnosis present

## 2022-03-14 DIAGNOSIS — R300 Dysuria: Secondary | ICD-10-CM | POA: Diagnosis not present

## 2022-03-14 HISTORY — PX: TRANSFORAMINAL LUMBAR INTERBODY FUSION W/ MIS 2 LEVEL: SHX6146

## 2022-03-14 LAB — GLUCOSE, CAPILLARY
Glucose-Capillary: 155 mg/dL — ABNORMAL HIGH (ref 70–99)
Glucose-Capillary: 122 mg/dL — ABNORMAL HIGH (ref 70–99)
Glucose-Capillary: 123 mg/dL — ABNORMAL HIGH (ref 70–99)
Glucose-Capillary: 222 mg/dL — ABNORMAL HIGH (ref 70–99)
Glucose-Capillary: 230 mg/dL — ABNORMAL HIGH (ref 70–99)
Glucose-Capillary: 175 mg/dL — ABNORMAL HIGH (ref 70–99)

## 2022-03-14 SURGERY — MINIMALLY INVASIVE (MIS) TRANSFORAMINAL LUMBAR INTERBODY FUSION (TLIF) 2 LEVEL
Anesthesia: General | Laterality: Right

## 2022-03-14 MED ORDER — FENTANYL CITRATE (PF) 250 MCG/5ML IJ SOLN
INTRAMUSCULAR | Status: AC
  Filled 2022-03-14: qty 5

## 2022-03-14 MED ORDER — DICYCLOMINE HCL 10 MG PO CAPS
10.0000 mg | ORAL_CAPSULE | Freq: Three times a day (TID) | ORAL | Status: DC | PRN
  Administered 2022-03-17 – 2022-03-18 (×3): 10 mg via ORAL
  Filled 2022-03-14 (×4): qty 1

## 2022-03-14 MED ORDER — PROPOFOL 1000 MG/100ML IV EMUL
INTRAVENOUS | Status: AC
  Filled 2022-03-14: qty 100

## 2022-03-14 MED ORDER — ROCURONIUM BROMIDE 10 MG/ML (PF) SYRINGE
PREFILLED_SYRINGE | INTRAVENOUS | Status: AC
  Filled 2022-03-14: qty 10

## 2022-03-14 MED ORDER — OXYCODONE HCL 5 MG PO TABS
ORAL_TABLET | ORAL | Status: AC
  Filled 2022-03-14: qty 1

## 2022-03-14 MED ORDER — SODIUM CHLORIDE 0.9 % IV SOLN: INTRAVENOUS | Status: DC | PRN

## 2022-03-14 MED ORDER — SUGAMMADEX SODIUM 200 MG/2ML IV SOLN
INTRAVENOUS | Status: DC | PRN
  Administered 2022-03-14: 300 mg via INTRAVENOUS

## 2022-03-14 MED ORDER — HYDROMORPHONE HCL 1 MG/ML IJ SOLN
INTRAMUSCULAR | Status: AC
  Filled 2022-03-14: qty 1

## 2022-03-14 MED ORDER — MENTHOL 3 MG MT LOZG: 1.0000 | LOZENGE | OROMUCOSAL | Status: DC | PRN

## 2022-03-14 MED ORDER — BUPIVACAINE LIPOSOME 1.3 % IJ SUSP
INTRAMUSCULAR | Status: DC | PRN
  Administered 2022-03-14: 20 mL

## 2022-03-14 MED ORDER — ONDANSETRON HCL 4 MG/2ML IJ SOLN: 4.0000 mg | Freq: Once | INTRAMUSCULAR | Status: DC | PRN

## 2022-03-14 MED ORDER — INSULIN ASPART 100 UNIT/ML IJ SOLN
0.0000 [IU] | Freq: Every day | INTRAMUSCULAR | Status: DC
  Administered 2022-03-14: 2 [IU] via SUBCUTANEOUS

## 2022-03-14 MED ORDER — LOSARTAN POTASSIUM 50 MG PO TABS
50.0000 mg | ORAL_TABLET | Freq: Every day | ORAL | Status: DC
  Administered 2022-03-14 – 2022-03-17 (×4): 50 mg via ORAL
  Filled 2022-03-14 (×6): qty 1

## 2022-03-14 MED ORDER — PHENOL 1.4 % MT LIQD: 1.0000 | OROMUCOSAL | Status: DC | PRN

## 2022-03-14 MED ORDER — PANTOPRAZOLE SODIUM 40 MG PO TBEC
40.0000 mg | DELAYED_RELEASE_TABLET | Freq: Every day | ORAL | Status: DC
  Administered 2022-03-14 – 2022-03-20 (×7): 40 mg via ORAL
  Filled 2022-03-14 (×7): qty 1

## 2022-03-14 MED ORDER — CEFAZOLIN SODIUM-DEXTROSE 2-4 GM/100ML-% IV SOLN
2.0000 g | Freq: Three times a day (TID) | INTRAVENOUS | Status: AC
  Administered 2022-03-14 – 2022-03-15 (×2): 2 g via INTRAVENOUS
  Filled 2022-03-14 (×2): qty 100

## 2022-03-14 MED ORDER — LIDOCAINE 2% (20 MG/ML) 5 ML SYRINGE
INTRAMUSCULAR | Status: AC
  Filled 2022-03-14: qty 5

## 2022-03-14 MED ORDER — GLIPIZIDE ER 2.5 MG PO TB24
2.5000 mg | ORAL_TABLET | Freq: Two times a day (BID) | ORAL | Status: DC
  Administered 2022-03-15 – 2022-03-20 (×12): 2.5 mg via ORAL
  Filled 2022-03-14 (×12): qty 1

## 2022-03-14 MED ORDER — ONDANSETRON HCL 4 MG/2ML IJ SOLN
INTRAMUSCULAR | Status: DC | PRN
  Administered 2022-03-14: 4 mg via INTRAVENOUS

## 2022-03-14 MED ORDER — ONDANSETRON HCL 4 MG/2ML IJ SOLN
INTRAMUSCULAR | Status: AC
  Filled 2022-03-14: qty 2

## 2022-03-14 MED ORDER — THROMBIN 20000 UNITS EX SOLR
CUTANEOUS | Status: DC | PRN
  Administered 2022-03-14: 20 mL via TOPICAL

## 2022-03-14 MED ORDER — ROSUVASTATIN CALCIUM 5 MG PO TABS
5.0000 mg | ORAL_TABLET | Freq: Every day | ORAL | Status: DC
  Administered 2022-03-14 – 2022-03-19 (×6): 5 mg via ORAL
  Filled 2022-03-14 (×6): qty 1

## 2022-03-14 MED ORDER — MECLIZINE HCL 12.5 MG PO TABS: 12.5000 mg | ORAL_TABLET | Freq: Three times a day (TID) | ORAL | Status: DC | PRN

## 2022-03-14 MED ORDER — HYDROMORPHONE HCL 1 MG/ML IJ SOLN
0.5000 mg | INTRAMUSCULAR | Status: DC | PRN
  Administered 2022-03-14 (×2): 0.5 mg via INTRAVENOUS
  Filled 2022-03-14 (×2): qty 0.5

## 2022-03-14 MED ORDER — PROPOFOL 10 MG/ML IV BOLUS
INTRAVENOUS | Status: DC | PRN
  Administered 2022-03-14: 130 mg via INTRAVENOUS
  Administered 2022-03-14: 25 ug/kg/min via INTRAVENOUS

## 2022-03-14 MED ORDER — BUPIVACAINE LIPOSOME 1.3 % IJ SUSP
INTRAMUSCULAR | Status: AC
  Filled 2022-03-14: qty 20

## 2022-03-14 MED ORDER — OXYCODONE HCL 5 MG PO TABS
10.0000 mg | ORAL_TABLET | ORAL | Status: DC | PRN
  Administered 2022-03-14 – 2022-03-15 (×9): 10 mg via ORAL
  Filled 2022-03-14 (×10): qty 2

## 2022-03-14 MED ORDER — OXYCODONE HCL 5 MG/5ML PO SOLN: 5.0000 mg | Freq: Once | ORAL | Status: AC | PRN

## 2022-03-14 MED ORDER — THROMBIN 5000 UNITS EX SOLR
CUTANEOUS | Status: AC
  Filled 2022-03-14: qty 5000

## 2022-03-14 MED ORDER — CHLORHEXIDINE GLUCONATE 0.12 % MT SOLN: 15.0000 mL | Freq: Once | OROMUCOSAL | Status: AC

## 2022-03-14 MED ORDER — ONDANSETRON HCL 4 MG PO TABS: 4.0000 mg | ORAL_TABLET | Freq: Four times a day (QID) | ORAL | Status: DC | PRN

## 2022-03-14 MED ORDER — HYDROCHLOROTHIAZIDE 12.5 MG PO CAPS
12.5000 mg | ORAL_CAPSULE | Freq: Every day | ORAL | Status: DC
  Filled 2022-03-14: qty 1

## 2022-03-14 MED ORDER — LIDOCAINE-EPINEPHRINE 1 %-1:100000 IJ SOLN
INTRAMUSCULAR | Status: DC | PRN
  Administered 2022-03-14: 20 mL

## 2022-03-14 MED ORDER — METHOCARBAMOL 500 MG PO TABS
500.0000 mg | ORAL_TABLET | Freq: Four times a day (QID) | ORAL | Status: DC | PRN
  Administered 2022-03-14 – 2022-03-19 (×6): 500 mg via ORAL
  Filled 2022-03-14 (×5): qty 1

## 2022-03-14 MED ORDER — INSULIN ASPART 100 UNIT/ML IJ SOLN
0.0000 [IU] | Freq: Three times a day (TID) | INTRAMUSCULAR | Status: DC
  Administered 2022-03-15: 5 [IU] via SUBCUTANEOUS
  Administered 2022-03-15: 3 [IU] via SUBCUTANEOUS
  Administered 2022-03-15: 2 [IU] via SUBCUTANEOUS
  Administered 2022-03-16 – 2022-03-17 (×3): 3 [IU] via SUBCUTANEOUS
  Administered 2022-03-17 – 2022-03-18 (×3): 2 [IU] via SUBCUTANEOUS
  Administered 2022-03-19 (×2): 3 [IU] via SUBCUTANEOUS
  Administered 2022-03-20: 5 [IU] via SUBCUTANEOUS
  Administered 2022-03-20: 3 [IU] via SUBCUTANEOUS

## 2022-03-14 MED ORDER — CEFAZOLIN SODIUM-DEXTROSE 2-4 GM/100ML-% IV SOLN
INTRAVENOUS | Status: AC
  Filled 2022-03-14: qty 100

## 2022-03-14 MED ORDER — ONDANSETRON HCL 4 MG/2ML IJ SOLN: 4.0000 mg | Freq: Four times a day (QID) | INTRAMUSCULAR | Status: DC | PRN

## 2022-03-14 MED ORDER — PROPOFOL 10 MG/ML IV BOLUS
INTRAVENOUS | Status: AC
  Filled 2022-03-14: qty 20

## 2022-03-14 MED ORDER — DOCUSATE SODIUM 100 MG PO CAPS
100.0000 mg | ORAL_CAPSULE | Freq: Two times a day (BID) | ORAL | Status: DC
  Administered 2022-03-14 – 2022-03-20 (×12): 100 mg via ORAL
  Filled 2022-03-14 (×12): qty 1

## 2022-03-14 MED ORDER — ESCITALOPRAM OXALATE 10 MG PO TABS
10.0000 mg | ORAL_TABLET | Freq: Every day | ORAL | Status: DC
  Administered 2022-03-14 – 2022-03-19 (×6): 10 mg via ORAL
  Filled 2022-03-14 (×6): qty 1

## 2022-03-14 MED ORDER — METHOCARBAMOL 500 MG PO TABS
ORAL_TABLET | ORAL | Status: AC
  Filled 2022-03-14: qty 1

## 2022-03-14 MED ORDER — MIDAZOLAM HCL 2 MG/2ML IJ SOLN
INTRAMUSCULAR | Status: AC
  Filled 2022-03-14: qty 2

## 2022-03-14 MED ORDER — THROMBIN 20000 UNITS EX SOLR
CUTANEOUS | Status: AC
  Filled 2022-03-14: qty 20000

## 2022-03-14 MED ORDER — CHLORHEXIDINE GLUCONATE 0.12 % MT SOLN
OROMUCOSAL | Status: AC
  Administered 2022-03-14: 15 mL via OROMUCOSAL
  Filled 2022-03-14: qty 15

## 2022-03-14 MED ORDER — FENTANYL CITRATE (PF) 250 MCG/5ML IJ SOLN
INTRAMUSCULAR | Status: DC | PRN
  Administered 2022-03-14 (×3): 50 ug via INTRAVENOUS
  Administered 2022-03-14: 100 ug via INTRAVENOUS

## 2022-03-14 MED ORDER — HYDROMORPHONE HCL 1 MG/ML IJ SOLN
0.2500 mg | INTRAMUSCULAR | Status: DC | PRN
  Administered 2022-03-14 (×2): 0.5 mg via INTRAVENOUS

## 2022-03-14 MED ORDER — CEFAZOLIN SODIUM-DEXTROSE 2-4 GM/100ML-% IV SOLN
2.0000 g | INTRAVENOUS | Status: AC
  Administered 2022-03-14: 2 g via INTRAVENOUS

## 2022-03-14 MED ORDER — SODIUM CHLORIDE 0.9% FLUSH
3.0000 mL | Freq: Two times a day (BID) | INTRAVENOUS | Status: DC
  Administered 2022-03-14: 3 mL via INTRAVENOUS

## 2022-03-14 MED ORDER — SODIUM CHLORIDE 0.9 % IV SOLN
250.0000 mL | INTRAVENOUS | Status: DC
  Administered 2022-03-14: 250 mL via INTRAVENOUS

## 2022-03-14 MED ORDER — ALPRAZOLAM 0.5 MG PO TABS: 0.2500 mg | ORAL_TABLET | Freq: Every evening | ORAL | Status: DC | PRN

## 2022-03-14 MED ORDER — LACTATED RINGERS IV SOLN: INTRAVENOUS | Status: DC

## 2022-03-14 MED ORDER — PHENYLEPHRINE 80 MCG/ML (10ML) SYRINGE FOR IV PUSH (FOR BLOOD PRESSURE SUPPORT)
PREFILLED_SYRINGE | INTRAVENOUS | Status: DC | PRN
  Administered 2022-03-14 (×3): 80 ug via INTRAVENOUS
  Administered 2022-03-14: 160 ug via INTRAVENOUS
  Administered 2022-03-14: 80 ug via INTRAVENOUS

## 2022-03-14 MED ORDER — LIDOCAINE 2% (20 MG/ML) 5 ML SYRINGE
INTRAMUSCULAR | Status: DC | PRN
  Administered 2022-03-14: 100 mg via INTRAVENOUS

## 2022-03-14 MED ORDER — THROMBIN 5000 UNITS EX SOLR
OROMUCOSAL | Status: DC | PRN
  Administered 2022-03-14: 5 mL via TOPICAL

## 2022-03-14 MED ORDER — SODIUM CHLORIDE 0.9% FLUSH: 3.0000 mL | INTRAVENOUS | Status: DC | PRN

## 2022-03-14 MED ORDER — MIDAZOLAM HCL 2 MG/2ML IJ SOLN
INTRAMUSCULAR | Status: DC | PRN
  Administered 2022-03-14: 2 mg via INTRAVENOUS

## 2022-03-14 MED ORDER — HYDROMORPHONE HCL 1 MG/ML IJ SOLN
INTRAMUSCULAR | Status: AC
  Filled 2022-03-14: qty 0.5

## 2022-03-14 MED ORDER — HYDROMORPHONE HCL 1 MG/ML IJ SOLN
INTRAMUSCULAR | Status: DC | PRN
  Administered 2022-03-14: .5 mg via INTRAVENOUS

## 2022-03-14 MED ORDER — CHLORHEXIDINE GLUCONATE CLOTH 2 % EX PADS: 6.0000 | MEDICATED_PAD | Freq: Once | CUTANEOUS | Status: DC

## 2022-03-14 MED ORDER — CEFAZOLIN SODIUM 1 G IJ SOLR
INTRAMUSCULAR | Status: AC
  Filled 2022-03-14: qty 20

## 2022-03-14 MED ORDER — OXYCODONE HCL 5 MG PO TABS
5.0000 mg | ORAL_TABLET | ORAL | Status: DC | PRN
  Administered 2022-03-16 – 2022-03-20 (×11): 5 mg via ORAL
  Filled 2022-03-14 (×11): qty 1

## 2022-03-14 MED ORDER — ORAL CARE MOUTH RINSE: 15.0000 mL | Freq: Once | OROMUCOSAL | Status: AC

## 2022-03-14 MED ORDER — DEXAMETHASONE SODIUM PHOSPHATE 10 MG/ML IJ SOLN
INTRAMUSCULAR | Status: AC
  Filled 2022-03-14: qty 1

## 2022-03-14 MED ORDER — 0.9 % SODIUM CHLORIDE (POUR BTL) OPTIME
TOPICAL | Status: DC | PRN
  Administered 2022-03-14: 1000 mL

## 2022-03-14 MED ORDER — LIDOCAINE-EPINEPHRINE 1 %-1:100000 IJ SOLN
INTRAMUSCULAR | Status: AC
  Filled 2022-03-14: qty 1

## 2022-03-14 MED ORDER — OXYCODONE HCL 5 MG PO TABS
5.0000 mg | ORAL_TABLET | Freq: Once | ORAL | Status: AC | PRN
  Administered 2022-03-14: 5 mg via ORAL

## 2022-03-14 MED ORDER — ROCURONIUM BROMIDE 10 MG/ML (PF) SYRINGE
PREFILLED_SYRINGE | INTRAVENOUS | Status: DC | PRN
  Administered 2022-03-14: 10 mg via INTRAVENOUS
  Administered 2022-03-14: 60 mg via INTRAVENOUS
  Administered 2022-03-14: 20 mg via INTRAVENOUS

## 2022-03-14 MED ORDER — HYDROCHLOROTHIAZIDE 12.5 MG PO TABS
12.5000 mg | ORAL_TABLET | Freq: Every day | ORAL | Status: DC
  Administered 2022-03-14 – 2022-03-20 (×7): 12.5 mg via ORAL
  Filled 2022-03-14 (×7): qty 1

## 2022-03-14 SURGICAL SUPPLY — 83 items
ADH SKN CLS APL DERMABOND .7 (GAUZE/BANDAGES/DRESSINGS) ×2
BAG COUNTER SPONGE SURGICOUNT (BAG) ×1 IMPLANT
BAG SPNG CNTER NS LX DISP (BAG) ×2
BAND INSRT 18 STRL LF DISP RB (MISCELLANEOUS) ×2
BAND RUBBER #18 3X1/16 STRL (MISCELLANEOUS) ×2 IMPLANT
BASKET BONE COLLECTION (BASKET) ×1 IMPLANT
BUR MATCHSTICK NEURO 3.0 LAGG (BURR) ×1 IMPLANT
CAGE CONVEX CASCADIA 8.5X22X9 (Cage) IMPLANT
CAGE CONVEX CASCADIA 8.5X28X8 (Cage) IMPLANT
CNTNR URN SCR LID CUP LEK RST (MISCELLANEOUS) ×1 IMPLANT
CONT SPEC 4OZ STRL OR WHT (MISCELLANEOUS) ×1
COVER BACK TABLE 60X90IN (DRAPES) ×1 IMPLANT
COVER MAYO STAND STRL (DRAPES) ×2 IMPLANT
DERMABOND ADVANCED .7 DNX12 (GAUZE/BANDAGES/DRESSINGS) IMPLANT
DRAIN JACKSON RD 7FR 3/32 (WOUND CARE) IMPLANT
DRAPE C-ARM 42X72 X-RAY (DRAPES) ×2 IMPLANT
DRAPE C-ARMOR (DRAPES) IMPLANT
DRAPE INCISE IOBAN 66X45 STRL (DRAPES) IMPLANT
DRAPE LAPAROTOMY 100X72X124 (DRAPES) ×1 IMPLANT
DRAPE MICROSCOPE SLANT 54X150 (MISCELLANEOUS) ×1 IMPLANT
DRAPE STERI IOBAN 125X83 (DRAPES) IMPLANT
DRAPE SURG 17X23 STRL (DRAPES) ×1 IMPLANT
DRSG OPSITE POSTOP 4X6 (GAUZE/BANDAGES/DRESSINGS) IMPLANT
DURAPREP 26ML APPLICATOR (WOUND CARE) ×1 IMPLANT
ELECT BLADE INSULATED 6.5IN (ELECTROSURGICAL) ×1
ELECT REM PT RETURN 9FT ADLT (ELECTROSURGICAL) ×1
ELECTRODE BLDE INSULATED 6.5IN (ELECTROSURGICAL) ×1 IMPLANT
ELECTRODE REM PT RTRN 9FT ADLT (ELECTROSURGICAL) ×1 IMPLANT
GAUZE 4X4 16PLY ~~LOC~~+RFID DBL (SPONGE) IMPLANT
GAUZE SPONGE 4X4 12PLY STRL (GAUZE/BANDAGES/DRESSINGS) ×1 IMPLANT
GLOVE BIOGEL PI IND STRL 7.0 (GLOVE) IMPLANT
GLOVE BIOGEL PI IND STRL 7.5 (GLOVE) IMPLANT
GLOVE BIOGEL PI IND STRL 8 (GLOVE) ×2 IMPLANT
GLOVE ECLIPSE 8.0 STRL XLNG CF (GLOVE) ×2 IMPLANT
GLOVE SURG ENC MOIS LTX SZ8 (GLOVE) ×1 IMPLANT
GLOVE SURG SS PI 6.5 STRL IVOR (GLOVE) IMPLANT
GLOVE SURG UNDER POLY LF SZ8.5 (GLOVE) ×1 IMPLANT
GOWN STRL REUS W/ TWL LRG LVL3 (GOWN DISPOSABLE) IMPLANT
GOWN STRL REUS W/ TWL XL LVL3 (GOWN DISPOSABLE) ×2 IMPLANT
GOWN STRL REUS W/TWL 2XL LVL3 (GOWN DISPOSABLE) IMPLANT
GOWN STRL REUS W/TWL LRG LVL3 (GOWN DISPOSABLE) ×2
GOWN STRL REUS W/TWL XL LVL3 (GOWN DISPOSABLE) ×4
GUIDEWIRE EVEREST 1.4X620 2-PK (WIRE) IMPLANT
HEMOSTAT POWDER KIT SURGIFOAM (HEMOSTASIS) ×1 IMPLANT
KIT BASIN OR (CUSTOM PROCEDURE TRAY) ×1 IMPLANT
KIT INFUSE X SMALL 1.4CC (Orthopedic Implant) IMPLANT
KIT POSITION SURG JACKSON T1 (MISCELLANEOUS) ×1 IMPLANT
KIT TURNOVER KIT B (KITS) ×1 IMPLANT
MARKER SKIN DUAL TIP RULER LAB (MISCELLANEOUS) ×1 IMPLANT
NDL BIOPSY DD SERENGETI 8G (NEEDLE) IMPLANT
NDL HYPO 21X1.5 SAFETY (NEEDLE) ×1 IMPLANT
NDL HYPO 25X1 1.5 SAFETY (NEEDLE) ×1 IMPLANT
NDL SPNL 22GX3.5 QUINCKE BK (NEEDLE) ×1 IMPLANT
NEEDLE BIOPSY DD SERENGETI 8G (NEEDLE) ×1 IMPLANT
NEEDLE HYPO 21X1.5 SAFETY (NEEDLE) ×1 IMPLANT
NEEDLE HYPO 25X1 1.5 SAFETY (NEEDLE) ×1 IMPLANT
NEEDLE SPNL 22GX3.5 QUINCKE BK (NEEDLE) ×1 IMPLANT
NS IRRIG 1000ML POUR BTL (IV SOLUTION) ×1 IMPLANT
PACK LAMINECTOMY NEURO (CUSTOM PROCEDURE TRAY) ×1 IMPLANT
PAD ARMBOARD 7.5X6 YLW CONV (MISCELLANEOUS) ×3 IMPLANT
PATTIES SURGICAL .5 X.5 (GAUZE/BANDAGES/DRESSINGS) IMPLANT
PATTIES SURGICAL .5 X1 (DISPOSABLE) IMPLANT
PATTIES SURGICAL 1X1 (DISPOSABLE) IMPLANT
PUTTY BONE 100 VESUVIUS 2.5CC (Putty) IMPLANT
ROD CONT BN EVEREST 5.5X65 (Rod) IMPLANT
SCREW CANN EVEREST 7.5X50 (Screw) IMPLANT
SET SCREW (Screw) ×6 IMPLANT
SET SCREW VRST (Screw) IMPLANT
SPIKE FLUID TRANSFER (MISCELLANEOUS) ×1 IMPLANT
SPONGE SURGIFOAM ABS GEL 100 (HEMOSTASIS) IMPLANT
SPONGE SURGIFOAM ABS GEL SZ50 (HEMOSTASIS) ×1 IMPLANT
SPONGE T-LAP 4X18 ~~LOC~~+RFID (SPONGE) IMPLANT
STAPLER VISISTAT 35W (STAPLE) IMPLANT
SUT VIC AB 0 CT1 18XCR BRD8 (SUTURE) ×1 IMPLANT
SUT VIC AB 0 CT1 8-18 (SUTURE) ×1
SUT VIC AB 2-0 CP2 18 (SUTURE) ×1 IMPLANT
SUT VIC AB 3-0 SH 8-18 (SUTURE) ×1 IMPLANT
SYR 20CC LL (SYRINGE) ×1 IMPLANT
TOWEL GREEN STERILE (TOWEL DISPOSABLE) ×1 IMPLANT
TOWEL GREEN STERILE FF (TOWEL DISPOSABLE) ×1 IMPLANT
TRAY FOLEY MTR SLVR 14FR STAT (SET/KITS/TRAYS/PACK) IMPLANT
TRAY FOLEY MTR SLVR 16FR STAT (SET/KITS/TRAYS/PACK) ×1 IMPLANT
WATER STERILE IRR 1000ML POUR (IV SOLUTION) ×1 IMPLANT

## 2022-03-14 NOTE — Op Note (Signed)
Providing Compassionate, Quality Care - Together  Date of service: 03/14/2022  PREOP DIAGNOSIS:  L3-4 degenerative spondylolisthesis, grade 1, stenosis, L4-5 degenerative disc disease, spondylosis, stenosis and radiculopathy   POSTOP DIAGNOSIS: Same   PROCEDURE: Minimally invasive L3-4, L 4-5 decompression and transforaminal lumbar interbody fusion and arthrodesis-right sided approach; posterolateral arthrodesis L3-4, L4-5,  Bilateral L3, L4, L5 segmental pedicle screw instrumentation, K2 M [place] L3: 7.5 x 50 mm bilaterally, L4: 7.5 x 50 mm bilaterally, L5: 7.5 x 50 mm bilaterally Placement of anterior biomechanical device, L3-4: 9 x 22 mm titanium K2 M [place] interbody; L4-5 8 x 28 mm titanium K2 M [place] interbody Intraoperative use of autograft, same incision Intraoperative use of allograft  Intraoperative use of BMP, XS Intraoperative use of fluoroscopy, greater than 1 hour Intraoperative use of microscope, for microdissection   SURGEON: Dr. Pieter Partridge C. Raunak Antuna, DO   ASSISTANT: Dr. Ashok Pall, MD   ANESTHESIA: General Endotracheal   EBL: 200 cc   SPECIMENS: None   DRAINS: None   COMPLICATIONS: None   CONDITION: Hemodynamically stable   HISTORY: Tammy Pineda is a 77 y.o. y.o. female who initially presented to the outpatient clinic with signs and symptoms consistent with right lower extremity radiculopathy and chronic low back pain.  X-rays revealed severe degenerative disc disease, spondylolisthesis at L3-4, with severe degenerative disc disease at L4-5 with slight coronal deformity.  She underwent multiple conservative measures for pain control. MRI demonstrated L3-4 degenerative spondylolisthesis with severe facet arthropathy bilaterally with severe stenosis, L4-5 severe degenerative disease with right greater than left lateral recess and foraminal stenosis. Treatment options were discussed including continued epidural steroid injections, physical therapy, pain  control or surgical intervention in the form of an L3-4, L4-5 minimally invasive decompression and transforaminal lumbar interbody fusion. All risks, benefits and expected outcomes were discussed agreed upon.  Informed consent was obtained and witnessed.   PROCEDURE IN DETAIL: The patient was brought to the operating room. After induction of general anesthesia, the patient was positioned on the operative table in the prone position on the [place] table. All pressure points were meticulously padded. Skin incision was then marked out and prepped and draped in the usual sterile fashion. Physician driven timeout was performed.   Using biplanar fluoroscopy, the C arm for sterilely draped and brought into the field and the paramedian incisions were planned over the L3-L4, L4-5 interspace.  Local anesthetic was injected bilaterally.  Using a 10 blade, paramedian incision was created bilaterally.  Using Jamshidi, the bilateral L3 pedicles were accessed under biplanar fluoroscopy.  K wires were placed and the Jamshidi's were removed.  This was repeated bilaterally at L4, L5.  Attention was then turned to docking the Metrix dilator.  Using with a series of dilators, on the patient's right side the facet lamina junction was docked with a 6 x 6 mm tube.  This was confirmed to be in the appropriate trajectory under lateral fluoroscopy.  At this point the microscope was sterilely draped and brought into the field.   L3-4 decompression, TLIF: Using Bovie electrocautery, soft tissue was cleared from the lamina and facet at L3-4.  Using a high-speed drill and collecting the autograft, a laminectomy and complete facetectomy was performed down to the ligamentum flavum.  This autograft was saved for later.  The ligamentum flavum was identified to its attachment along the superior lamina and lateral pars.  Using a series of micro curettes, the ligamentum flavum was gently elevated and the epidural space was  identified.  The  ligamentum flavum was completely resected using Kerrison rongeurs.  The traversing and exiting nerve roots were identified.  Using Kerrison rongeurs, the common dural tube and traversing and exiting nerve roots were completely decompressed from the ligamentum flavum.  They appeared pulsatile and noncompressed.  Camden's triangle was identified, the traversing nerve root was gently retracted medially, and the epidural space was coagulated and cleared of epidural veins.  The disc space was identified.  The disc annulus was then coagulated and incised with an 11 blade.  Using Kerrison rongeurs the annulotomy was widened.  Using a series of disc prep shavers and curettes a radical discectomy was performed to subchondral bleeding bone at both endplates.  The disc base was then trialed and found to be in 9 mm interbody size.  A mixture of autograft, BMP and allograft was then placed anteriorly and medially and packed with a bone tamp.  Using a nerve root retractor, the traversing nerve root was gently protected during placement of the interbody.   Using lateral fluoroscopy, the interbody was then placed under live fluoroscopy.  Appropriate placement was identified.  The remainder of the autograft and allograft was then placed laterally and the intervertebral space to the interbody device.  Hemostasis was achieved with passive hemostatics and bipolar cautery.  The traversing nerve root, neural tube and exiting nerve root were followed with a ball-tipped probe and noted to be noncompressed, pulsatile and in their normal position.  A few pieces of Gelfoam with thrombin were placed over the thecal sac.  The lateral gutter was then filled with the remaining mixture of autograft and allograft along the transverse processes.  The Metrix dilator tube was gently removed and hemostasis was achieved with bipolar cautery and the soft tissues.   L 4-5 decompression, TLIF: Using Bovie electrocautery, soft tissue was cleared from  the lamina and facet at L 4-5.  Using a high-speed drill and collecting the autograft, a laminectomy and complete facetectomy was performed down to the ligamentum flavum.  This autograft was saved for later.  The ligamentum flavum was identified to its attachment along the superior lamina and lateral pars.  Using a series of micro curettes, the ligamentum flavum was gently elevated and the epidural space was identified.  The ligamentum flavum was completely resected using Kerrison rongeurs.  The traversing and exiting nerve roots were identified.  Using Kerrison rongeurs, the common dural tube and traversing and exiting nerve roots were completely decompressed from the ligamentum flavum.  They appeared pulsatile and noncompressed.  Camden's triangle was identified, the traversing nerve root was gently retracted medially, and the epidural space was coagulated and cleared of epidural veins.  The disc space was identified.  The disc annulus was then coagulated and incised with an 11 blade.  Using Kerrison rongeurs the annulotomy was widened.  Using a series of disc prep shavers and curettes a radical discectomy was performed to subchondral bleeding bone at both endplates.  The disc base was then trialed and found to be in 8 mm interbody size.  A mixture of autograft, BMP and allograft was then placed anteriorly and medially and packed with a bone tamp.  Using a nerve root retractor, the traversing nerve root was gently protected during placement of the interbody.   Using lateral fluoroscopy, the interbody was then placed under live fluoroscopy.  Appropriate placement was identified.  The remainder of the autograft and allograft was then placed laterally and the intervertebral space to the interbody device.  Hemostasis was achieved with passive hemostatics and bipolar cautery.  The traversing nerve root, neural tube and exiting nerve root were followed with a ball-tipped probe and noted to be noncompressed, pulsatile  and in their normal position.  A few pieces of Gelfoam with thrombin were placed over the thecal sac.  The lateral gutter was then filled with the remaining mixture of autograft and allograft along the transverse processes.  The Metrix dilator tube was gently removed and hemostasis was achieved with bipolar cautery and the soft tissues.  The above listed pedicle screws were then placed under lateral fluoroscopy over the K wires bilaterally at L3, L4, L5.  Appropriate sized rods were measured, contoured and placed percutaneously.  These were confirmed to be within the [place], setscrews were placed and final tightened to the manufacturer's recommendations.  Percutaneous towers were removed from the screws.  Final AP and lateral fluoroscopy images confirmed appropriate placement of all hardware.  Hemostasis was achieved with bipolar cautery.  The wound was closed in layers, 2-0 and 3-0 Vicryl sutures for the dermis.  Skin was closed with skin glue.  Sterile dressing was applied.   At the end of the case all sponge, needle, and instrument counts were correct. The patient was then transferred to the stretcher, extubated, and taken to the post-anesthesia care unit in stable hemodynamic condition.

## 2022-03-14 NOTE — Transfer of Care (Signed)
Immediate Anesthesia Transfer of Care Note  Patient: Tammy Pineda  Procedure(s) Performed: Minimally Invasive Surgery Decompression, Transforaminal Lumbar Interbody Fusion Lumbar three-four, Lumbar four-five (Right)  Patient Location: PACU  Anesthesia Type:General  Level of Consciousness: awake, alert , and oriented  Airway & Oxygen Therapy: Patient connected to face mask oxygen  Post-op Assessment: Report given to RN and Post -op Vital signs reviewed and stable  Post vital signs: Reviewed and stable  Last Vitals:  Vitals Value Taken Time  BP 111/89 03/14/22 1523  Temp    Pulse 71 03/14/22 1526  Resp 24 03/14/22 1526  SpO2 98 % 03/14/22 1526  Vitals shown include unvalidated device data.  Last Pain:  Vitals:   03/14/22 1014  TempSrc:   PainSc: 5       Patients Stated Pain Goal: 2 (54/98/26 4158)  Complications: There were no known notable events for this encounter.

## 2022-03-14 NOTE — H&P (Signed)
Providing Compassionate, Quality Care - Together  NEUROSURGERY HISTORY & PHYSICAL   Tammy Pineda is an 77 y.o. female.   Chief Complaint: RLE radiculopathy HPI: This is a pleasant 77 year old female with chronic right lower extremity radiculopathy and low back pain that's been Progressively worsening.  MRI revealed severe degenerative spondylosis at L3-4, L4-5 with severe stenosis, lateral recess and foraminal with degenerative disc disease and facet arthropathy.  She failed multiple conservative measures and presents today for surgical intervention in the form of an L3-5 minimally invasive decompression and transforaminal lumbar body fusion.  Past Medical History:  Diagnosis Date   Anxiety    Arthritis    Chronic kidney disease    Coronary artery disease    nonobstructive by cardiac catheterization 2011 with a 30% LAD lesion   Depression    Diabetes ([place])    Type 2   GERD (gastroesophageal reflux disease)    Glaucoma    Hypercholesteremia    Hypertension    NASH (nonalcoholic steatohepatitis)    NASH   Pneumonia    30 years ago   PVC (premature ventricular contraction)    Seasonal allergies    Sleep apnea    Spinal stenosis     Past Surgical History:  Procedure Laterality Date   ANTERIOR AND POSTERIOR VAGINAL REPAIR     APPENDECTOMY  1959   BLADDER SUSPENSION     BUNIONECTOMY     CARDIAC CATHETERIZATION  2011   clean cath   CARDIOVASCULAR STRESS TEST  2022   CATARACT EXTRACTION Left 2015   CATARACT EXTRACTION W/ INTRAOCULAR LENS IMPLANT Right 2010   KNEE ARTHROSCOPY Left    NASAL RECONSTRUCTION  1976   ROBOTIC ASSISTED LAPAROSCOPIC SACROCOLPOPEXY     with mesh, urethral sling, posterior vaginal repair   SHOULDER ARTHROSCOPY Left    SHOULDER SURGERY Right 10/16/2021   repair   TOTAL ABDOMINAL HYSTERECTOMY  1976   TOTAL KNEE ARTHROPLASTY Left 01/03/2020   Procedure: TOTAL KNEE ARTHROPLASTY;  Surgeon: Gaynelle Arabian, MD;  Location: [facility];  Service:  Orthopedics;  Laterality: Left;  53mn    Family History  Problem Relation Age of Onset   Heart disease Father        open heart surgery at 823  Colon cancer Father    Heart disease Mother        open heart surgery at age 79  Hypertension Mother    Heart attack Maternal Grandfather    Stroke Paternal Grandmother    Heart block Brother    Social History:  reports that she has never smoked. She has never used smokeless tobacco. She reports that she does not drink alcohol and does not use drugs.  Allergies:  Allergies  Allergen Reactions   Metformin And Related Diarrhea   Gabapentin Palpitations   Hydrocodone Itching   Plaquenil [Hydroxychloroquine] Nausea And Vomiting   Tylenol [Acetaminophen] Other (See Comments)    History of Liver Disease   Sulfamethoxazole-Trimethoprim Rash   Tape Rash    Medications Prior to Admission  Medication Sig Dispense Refill   ALPRAZolam (XANAX) 0.5 MG tablet Take 0.25 mg by mouth at bedtime as needed for anxiety.     brimonidine (ALPHAGAN) 0.2 % ophthalmic solution Place 1 drop into both eyes 2 (two) times daily.     Cholecalciferol (VITAMIN D) 125 MCG (5000 UT) CAPS Take 5,000 Units by mouth daily.     Cyanocobalamin (VITAMIN B-12) 5000 MCG TBDP Take 1,000 mcg by mouth See  admin instructions. Takes on Monday and Thursday     dicyclomine (BENTYL) 10 MG capsule Take 10 mg by mouth 3 (three) times daily as needed for spasms.     escitalopram (LEXAPRO) 10 MG tablet Take 10 mg by mouth at bedtime.      glipiZIDE (GLUCOTROL XL) 2.5 MG 24 hr tablet TAKE 1 TABLET BY MOUTH 2x DAILY BEFORE BREAKFAST (Patient taking differently: Take 2.5 mg by mouth 2 (two) times daily with breakfast and lunch.) 180 tablet 3   glucose blood (ONETOUCH VERIO) test strip Use as instructed to check blood sugar 1X daily 300 each 3   hydrochlorothiazide (MICROZIDE) 12.5 MG capsule Take 12.5 mg by mouth daily.     HYDROcodone-acetaminophen (NORCO/VICODIN) 5-325 MG tablet Take 1  tablet by mouth every 6 (six) hours as needed for moderate pain.     latanoprost (XALATAN) 0.005 % ophthalmic solution Place 1 drop into both eyes at bedtime.     losartan (COZAAR) 50 MG tablet Take 50 mg by mouth at bedtime.     methocarbamol (ROBAXIN) 500 MG tablet Take 1 tablet (500 mg total) by mouth every 6 (six) hours as needed for muscle spasms. 40 tablet 0   omeprazole (PRILOSEC) 20 MG capsule Take 20 mg by mouth daily as needed (acid reflux/indigestion).     rosuvastatin (CRESTOR) 5 MG tablet Take 5 mg by mouth at bedtime.      timolol (TIMOPTIC) 0.5 % ophthalmic solution Place 1 drop into both eyes daily.     ASPIRIN 81 PO Take 81 mg by mouth daily. (Patient not taking: Reported on 03/05/2022)     Continuous Blood Gluc Sensor (DEXCOM G7 SENSOR) MISC 3 each by Does not apply route every 30 (thirty) days. Apply 1 sensor every 10 days 9 each 4   meclizine (ANTIVERT) 12.5 MG tablet Take 1 tablet (12.5 mg total) by mouth 3 (three) times daily as needed for dizziness. 21 tablet 0    Results for orders placed or performed during the hospital encounter of 03/14/22 (from the past 48 hour(s))  Glucose, capillary     Status: Abnormal   Collection Time: 03/14/22  9:23 AM  Result Value Ref Range   Glucose-Capillary 155 (H) 70 - 99 mg/dL    Comment: Glucose reference range applies only to samples taken after fasting for at least 8 hours.   No results found.  ROS All pertinent positives and negatives are listed  Blood pressure (!) 141/56, pulse 68, temperature (!) 97.3 F (36.3 C), temperature source Oral, resp. rate 18, height '5\' 5"'$  (1.651 m), weight 79.8 kg, SpO2 100 %. Physical Exam  Awake alert oriented x 3 PERRLA Cranial nerves II through XII intact BUE/Lower extremities 5/5 Deep and reflexes 2/4 throughout  Assessment/Plan 77 year old female with  L3-5 degenerative stenosis, spondylosis and radiculopathy  -OR today for L3-5 minimally invasive decompression and transforaminal  lumbar fusion. All risks, benefits and expected outcomes were discussed and agreed upon.  Informed consent was obtained and witnessed.   Thank you for allowing me to participate in this patient's care.  Please do not hesitate to call with questions or concerns.   Elwin Sleight, [facility] Cell: [phone]

## 2022-03-14 NOTE — Anesthesia Postprocedure Evaluation (Signed)
Anesthesia Post Note  Patient: NEKESHA FONT  Procedure(s) Performed: Minimally Invasive Surgery Decompression, Transforaminal Lumbar Interbody Fusion Lumbar three-four, Lumbar four-five (Right)     Patient location during evaluation: PACU Anesthesia Type: General Level of consciousness: awake and alert Pain management: pain level controlled Vital Signs Assessment: post-procedure vital signs reviewed and stable Respiratory status: spontaneous breathing, nonlabored ventilation, respiratory function stable and patient connected to nasal cannula oxygen Cardiovascular status: blood pressure returned to baseline and stable Postop Assessment: no apparent nausea or vomiting Anesthetic complications: no  There were no known notable events for this encounter.  Last Vitals:  Vitals:   03/14/22 1630 03/14/22 1645  BP: (!) 172/65 (!) 150/75  Pulse: 67 69  Resp: 17 18  Temp:    SpO2: 97% 97%    Last Pain:  Vitals:   03/14/22 1600  TempSrc:   PainSc: 10-Worst pain ever                 Krew Hortman S

## 2022-03-14 NOTE — Anesthesia Procedure Notes (Addendum)
Procedure Name: Intubation Date/Time: 03/14/2022 11:20 AM  Performed by: Ester Rink, CRNAPre-anesthesia Checklist: Patient identified, Emergency Drugs available, Suction available and Patient being monitored Patient Re-evaluated:Patient Re-evaluated prior to induction Oxygen Delivery Method: Circle system utilized Preoxygenation: Pre-oxygenation with 100% oxygen Induction Type: IV induction Ventilation: Mask ventilation without difficulty Laryngoscope Size: Mac and 4 Grade View: Grade I Tube type: Oral Tube size: 7.0 mm Number of attempts: 1 Airway Equipment and Method: Stylet and Oral airway Placement Confirmation: ETT inserted through vocal cords under direct vision, positive ETCO2 and breath sounds checked- equal and bilateral Secured at: 22 cm Tube secured with: Tape Dental Injury: Teeth and Oropharynx as per pre-operative assessment

## 2022-03-15 ENCOUNTER — Encounter (HOSPITAL_COMMUNITY): Payer: Self-pay | Admitting: Neurological Surgery

## 2022-03-15 DIAGNOSIS — F419 Anxiety disorder, unspecified: Secondary | ICD-10-CM | POA: Diagnosis present

## 2022-03-15 DIAGNOSIS — Z800 Family history of malignant neoplasm of digestive organs: Secondary | ICD-10-CM | POA: Diagnosis not present

## 2022-03-15 DIAGNOSIS — E1122 Type 2 diabetes mellitus with diabetic chronic kidney disease: Secondary | ICD-10-CM | POA: Diagnosis present

## 2022-03-15 DIAGNOSIS — Z79899 Other long term (current) drug therapy: Secondary | ICD-10-CM | POA: Diagnosis not present

## 2022-03-15 DIAGNOSIS — Z8249 Family history of ischemic heart disease and other diseases of the circulatory system: Secondary | ICD-10-CM | POA: Diagnosis not present

## 2022-03-15 DIAGNOSIS — Z90710 Acquired absence of both cervix and uterus: Secondary | ICD-10-CM | POA: Diagnosis not present

## 2022-03-15 DIAGNOSIS — I129 Hypertensive chronic kidney disease with stage 1 through stage 4 chronic kidney disease, or unspecified chronic kidney disease: Secondary | ICD-10-CM | POA: Diagnosis present

## 2022-03-15 DIAGNOSIS — K219 Gastro-esophageal reflux disease without esophagitis: Secondary | ICD-10-CM | POA: Diagnosis present

## 2022-03-15 DIAGNOSIS — K5900 Constipation, unspecified: Secondary | ICD-10-CM | POA: Diagnosis not present

## 2022-03-15 DIAGNOSIS — M4316 Spondylolisthesis, lumbar region: Secondary | ICD-10-CM | POA: Diagnosis present

## 2022-03-15 DIAGNOSIS — B3731 Acute candidiasis of vulva and vagina: Secondary | ICD-10-CM | POA: Diagnosis not present

## 2022-03-15 DIAGNOSIS — M4726 Other spondylosis with radiculopathy, lumbar region: Secondary | ICD-10-CM | POA: Diagnosis present

## 2022-03-15 DIAGNOSIS — Z961 Presence of intraocular lens: Secondary | ICD-10-CM | POA: Diagnosis present

## 2022-03-15 DIAGNOSIS — Z87898 Personal history of other specified conditions: Secondary | ICD-10-CM | POA: Diagnosis not present

## 2022-03-15 DIAGNOSIS — E440 Moderate protein-calorie malnutrition: Secondary | ICD-10-CM | POA: Diagnosis present

## 2022-03-15 DIAGNOSIS — R300 Dysuria: Secondary | ICD-10-CM | POA: Diagnosis not present

## 2022-03-15 DIAGNOSIS — M48061 Spinal stenosis, lumbar region without neurogenic claudication: Secondary | ICD-10-CM | POA: Diagnosis present

## 2022-03-15 DIAGNOSIS — Z9841 Cataract extraction status, right eye: Secondary | ICD-10-CM | POA: Diagnosis not present

## 2022-03-15 DIAGNOSIS — E876 Hypokalemia: Secondary | ICD-10-CM | POA: Diagnosis present

## 2022-03-15 DIAGNOSIS — G8929 Other chronic pain: Secondary | ICD-10-CM | POA: Diagnosis present

## 2022-03-15 DIAGNOSIS — E7800 Pure hypercholesterolemia, unspecified: Secondary | ICD-10-CM | POA: Diagnosis present

## 2022-03-15 DIAGNOSIS — Z741 Need for assistance with personal care: Secondary | ICD-10-CM | POA: Diagnosis present

## 2022-03-15 DIAGNOSIS — G4733 Obstructive sleep apnea (adult) (pediatric): Secondary | ICD-10-CM | POA: Diagnosis present

## 2022-03-15 DIAGNOSIS — M5116 Intervertebral disc disorders with radiculopathy, lumbar region: Secondary | ICD-10-CM | POA: Diagnosis present

## 2022-03-15 DIAGNOSIS — M5416 Radiculopathy, lumbar region: Secondary | ICD-10-CM | POA: Diagnosis not present

## 2022-03-15 DIAGNOSIS — E871 Hypo-osmolality and hyponatremia: Secondary | ICD-10-CM | POA: Diagnosis present

## 2022-03-15 DIAGNOSIS — M7989 Other specified soft tissue disorders: Secondary | ICD-10-CM | POA: Diagnosis not present

## 2022-03-15 DIAGNOSIS — I82451 Acute embolism and thrombosis of right peroneal vein: Secondary | ICD-10-CM | POA: Diagnosis not present

## 2022-03-15 DIAGNOSIS — Z888 Allergy status to other drugs, medicaments and biological substances status: Secondary | ICD-10-CM | POA: Diagnosis not present

## 2022-03-15 DIAGNOSIS — Z823 Family history of stroke: Secondary | ICD-10-CM | POA: Diagnosis not present

## 2022-03-15 DIAGNOSIS — I2510 Atherosclerotic heart disease of native coronary artery without angina pectoris: Secondary | ICD-10-CM | POA: Diagnosis present

## 2022-03-15 DIAGNOSIS — E1169 Type 2 diabetes mellitus with other specified complication: Secondary | ICD-10-CM | POA: Diagnosis not present

## 2022-03-15 DIAGNOSIS — Z6829 Body mass index (BMI) 29.0-29.9, adult: Secondary | ICD-10-CM | POA: Diagnosis not present

## 2022-03-15 DIAGNOSIS — F32A Depression, unspecified: Secondary | ICD-10-CM | POA: Diagnosis present

## 2022-03-15 DIAGNOSIS — E119 Type 2 diabetes mellitus without complications: Secondary | ICD-10-CM | POA: Diagnosis not present

## 2022-03-15 DIAGNOSIS — N390 Urinary tract infection, site not specified: Secondary | ICD-10-CM | POA: Diagnosis not present

## 2022-03-15 DIAGNOSIS — E669 Obesity, unspecified: Secondary | ICD-10-CM | POA: Diagnosis present

## 2022-03-15 DIAGNOSIS — Z885 Allergy status to narcotic agent status: Secondary | ICD-10-CM | POA: Diagnosis not present

## 2022-03-15 DIAGNOSIS — N182 Chronic kidney disease, stage 2 (mild): Secondary | ICD-10-CM | POA: Diagnosis present

## 2022-03-15 DIAGNOSIS — Z96652 Presence of left artificial knee joint: Secondary | ICD-10-CM | POA: Diagnosis present

## 2022-03-15 DIAGNOSIS — Z4789 Encounter for other orthopedic aftercare: Secondary | ICD-10-CM | POA: Diagnosis present

## 2022-03-15 DIAGNOSIS — D649 Anemia, unspecified: Secondary | ICD-10-CM | POA: Diagnosis present

## 2022-03-15 DIAGNOSIS — M25511 Pain in right shoulder: Secondary | ICD-10-CM | POA: Diagnosis not present

## 2022-03-15 DIAGNOSIS — Z882 Allergy status to sulfonamides status: Secondary | ICD-10-CM | POA: Diagnosis not present

## 2022-03-15 DIAGNOSIS — K7581 Nonalcoholic steatohepatitis (NASH): Secondary | ICD-10-CM | POA: Diagnosis present

## 2022-03-15 DIAGNOSIS — R5381 Other malaise: Secondary | ICD-10-CM | POA: Diagnosis present

## 2022-03-15 DIAGNOSIS — N3000 Acute cystitis without hematuria: Secondary | ICD-10-CM | POA: Diagnosis not present

## 2022-03-15 DIAGNOSIS — I10 Essential (primary) hypertension: Secondary | ICD-10-CM | POA: Diagnosis present

## 2022-03-15 LAB — GLUCOSE, CAPILLARY
Glucose-Capillary: 225 mg/dL — ABNORMAL HIGH (ref 70–99)
Glucose-Capillary: 168 mg/dL — ABNORMAL HIGH (ref 70–99)
Glucose-Capillary: 141 mg/dL — ABNORMAL HIGH (ref 70–99)
Glucose-Capillary: 151 mg/dL — ABNORMAL HIGH (ref 70–99)

## 2022-03-15 MED ORDER — HYDROMORPHONE HCL 1 MG/ML IJ SOLN
1.0000 mg | INTRAMUSCULAR | Status: DC | PRN
  Administered 2022-03-15 – 2022-03-16 (×3): 1 mg via INTRAMUSCULAR
  Filled 2022-03-15 (×4): qty 1

## 2022-03-15 MED ORDER — CHLORHEXIDINE GLUCONATE CLOTH 2 % EX PADS
6.0000 | MEDICATED_PAD | Freq: Every day | CUTANEOUS | Status: DC
  Administered 2022-03-15: 6 via TOPICAL

## 2022-03-15 MED ORDER — MUPIROCIN 2 % EX OINT
TOPICAL_OINTMENT | Freq: Two times a day (BID) | CUTANEOUS | Status: AC
  Administered 2022-03-17: 1 via NASAL
  Filled 2022-03-15 (×3): qty 22

## 2022-03-15 NOTE — TOC Initial Note (Addendum)
Transition of Care [facility]) - Initial/Assessment Note    Patient Details  Name: Tammy Pineda MRN: [id number] Date of Birth: 10-Aug-1945  Transition of Care [facility]) CM/SW Contact:    Pollie Friar, RN Phone Number: 03/15/2022, 10:44 AM  Clinical Narrative:                 Pt is from home with her spouse and multiple extended family. She has support and supervision at home. Spouse is able to provide needed transportation.  Pt manages her own medications and denies any issues.  Home health recommended. CM met with the patient and her spouse and they say they always use [facility] and want to use them again. Caryl Pina with [facility] accepted the referral. Information on the AVS. Pt states she has needed DME at home.  Spouse will transport home when medically ready. MD pt will need [place] orders and face to face prior to d/c please.    Expected Discharge Plan: [place] Barriers to Discharge: Continued Medical Work up   Patient Goals and CMS Choice   CMS Medicare.gov Compare Post Acute Care list provided to:: Patient Choice offered to / list presented to : Patient, Spouse      Expected Discharge Plan and Services   Discharge Planning Services: CM Consult Post Acute Care Choice: [place] arrangements for the past 2 months: Single Family Home                           HH Arranged: PT, OT [place] Agency: [facility] ([place]) Date HH Agency Contacted: 03/15/22   Representative spoke with at [place]  Prior Living Arrangements/Services Living arrangements for the past 2 months: [place] Lives with:: Adult Children, Minor Children, Spouse Patient language and need for interpreter reviewed:: Yes Do you feel safe going back to the place where you live?: Yes      Need for Family Participation in Patient Care: Yes (Comment) Care giver support system in place?: Yes (comment) Current home services: DME (Pt states all DME) Criminal  Activity/Legal Involvement Pertinent to Current Situation/Hospitalization: No - Comment as needed  Activities of Daily Living      Permission Sought/Granted                  Emotional Assessment Appearance:: Appears stated age Attitude/Demeanor/Rapport: Engaged Affect (typically observed): Accepting Orientation: : Oriented to Self, Oriented to Place, Oriented to  Time, Oriented to Situation   Psych Involvement: No (comment)  Admission diagnosis:  Spondylolisthesis, lumbar region [M43.16] Patient Active Problem List   Diagnosis Date Noted   Spondylolisthesis, lumbar region 03/14/2022   Vitamin D insufficiency 10/22/2021   Osteoporosis without current pathological fracture 06/19/2021   Rheumatoid arthritis ([place])    OA (osteoarthritis) of knee 01/03/2020   Primary osteoarthritis of left knee 01/03/2020   Type 2 diabetes mellitus with hyperglycemia, without long-term current use of insulin ([place]) 04/27/2018   Jaw pain 11/19/2012   Obstructive sleep apnea 08/23/2012   CAD, NATIVE VESSEL 12/21/2009   PALPITATIONS 10/19/2009   HYPERLIPIDEMIA 09/26/2008   Essential hypertension 09/26/2008   REACTIVE AIRWAY DISEASE 09/26/2008   GERD 09/26/2008   ARTHROSCOPY, LEFT KNEE, HX OF 09/26/2008   PCP:  Lurline Del, DO Pharmacy:   [facility], [place] [address] Phone: [phone] Fax: [phone]  [facility] [id number] -  [place] ([place]), [place] - [id number] [place]) [place] [address] Phone: [phone] Fax: [phone]     Social Determinants of Health (SDOH) Social History: SDOH Screenings   Tobacco Use: Low Risk  (03/15/2022)   SDOH Interventions:     Readmission Risk Interventions     No data to display

## 2022-03-15 NOTE — Progress Notes (Incomplete)
Patient alert and oriented, void, ambulate, VSS surgical site clean and dry. Report given to 2W RN, all questions answered.

## 2022-03-15 NOTE — Progress Notes (Signed)
Orthopedic Tech Progress Note Patient Details:  Tammy Pineda 1945/09/11 [id number]  Ortho Devices Type of Ortho Device: Lumbar corsett Ortho Device/Splint Interventions: Ordered      Danton Sewer A Kienan Doublin 03/15/2022, 10:29 AM

## 2022-03-15 NOTE — Progress Notes (Signed)
Patient unable to void at this time. Dr. Reatha Armour made aware. Per Dr. Reatha Armour, to straight cath patient x1

## 2022-03-15 NOTE — Progress Notes (Signed)
   Providing Compassionate, Quality Care - Together  NEUROSURGERY PROGRESS NOTE   S: No issues overnight. Complaining of significant LBP  O: EXAM:  BP (!) 135/56 (BP Location: Left Arm)   Pulse 94   Temp 99.5 F (37.5 C) (Oral)   Resp 16   Ht '5\' 5"'$  (1.651 m)   Wt 79.8 kg   SpO2 91%   BMI 29.29 kg/m   Awake, alert, oriented x3, mod distress due to pain PERRL Speech fluent, appropriate  CNs grossly intact  5/5 BUE 4/5 BLE due to pain, although DF/PF 4+/5 BL  ASSESSMENT:  77 y.o. female with   L3-5 spondylosis, stenosis and radiculopathy  PLAN: -Continue pain control today.  May need a few more days for appropriate pain control -Continue PT/OT -Will add intramuscular Dilaudid as she has no IV at this time    Thank you for allowing me to participate in this patient's care.  Please do not hesitate to call with questions or concerns.   Elwin Sleight, [facility] Cell: [phone]

## 2022-03-15 NOTE — Progress Notes (Signed)
Patient bladder scanned is retaining 310 mls of urine in bladder. Patient states she is having urge to urinate but not able to void. Patient reporting of having some dribbling occur. Patient encouraged to urinate.

## 2022-03-15 NOTE — Evaluation (Signed)
Occupational Therapy Evaluation Patient Details Name: Tammy Pineda MRN: [id number] DOB: 30-Apr-1945 Today's Date: 03/15/2022   History of Present Illness 77 y/o female who presents on 2/8 for L 3-4, 4-5 TLIF. PMH includes HTN, glaucoma, CKD, CAD, depression, anxiety, DM.   Clinical Impression   Patient is s/p L3-4 L4-5 TLIF surgery resulting in functional limitations due to the deficits listed below (see OT problem list). Pt at baseline indep and driving. Pt currently with pain and balance deficits limiting daily adls.  Patient will benefit from skilled OT acutely to increase independence and safety with ADLS to allow discharge [place]. Pt declines SNF level care.        Recommendations for follow up therapy are one component of a multi-disciplinary discharge planning process, led by the attending physician.  Recommendations may be updated based on patient status, additional functional criteria and insurance authorization.   Follow Up Recommendations  Home health OT (refusing Snf level care)     Assistance Recommended at Discharge Intermittent Supervision/Assistance  Patient can return home with the following A lot of help with bathing/dressing/bathroom;A little help with walking and/or transfers    Functional Status Assessment  Patient has had a recent decline in their functional status and demonstrates the ability to make significant improvements in function in a reasonable and predictable amount of time.  Equipment Recommendations  None recommended by OT    Recommendations for Other Services       Precautions / Restrictions Precautions Precautions: Back Precaution Booklet Issued: Yes (comment) Precaution Comments: Reviewed handout and precautions Restrictions Weight Bearing Restrictions: No      Mobility Bed Mobility Overal bed mobility: Needs Assistance Bed Mobility: Rolling, Sit to Supine Rolling: Mod assist     Sit to supine: Max assist   General bed mobility  comments: pt requires step by step one step sequence to complete task. pt transfered side to supine and then attempting to arch. pt needs cues to roll like a log and sequence task. pt at risk for posterior lean with transfers. pt dependence on bil LE positioning into the bed and onto the bed    Transfers Overall transfer level: Needs assistance Equipment used: Rolling walker (2 wheels) Transfers: Sit to/from Stand Sit to Stand: Mod assist           General transfer comment: pt requires (A) to control descend to the bed surface/ pt needs increased (A) for toilet height. pt encouraged to stay standing and pt states "i can't and sitting"      Balance Overall balance assessment: Needs assistance Sitting-balance support: Feet supported, Bilateral upper extremity supported Sitting balance-Leahy Scale: Fair Sitting balance - Comments: UE support due to pain   Standing balance support: During functional activity, Bilateral upper extremity supported, Reliant on assistive device for balance Standing balance-Leahy Scale: Poor Standing balance comment: Requires UE support on RW and external support due to BLE weakness, and knee instability/buckling                           ADL either performed or assessed with clinical judgement   ADL Overall ADL's : Needs assistance/impaired Eating/Feeding: Set up   Grooming: Applying deodorant   Upper Body Bathing: Minimal assistance   Lower Body Bathing: Maximal assistance   Upper Body Dressing : Minimal assistance Upper Body Dressing Details (indicate cue type and reason): back brace arriving after patient supine. pt educated on need to fit the brace in next oob task,  demonstrations on how the brace would be fit by staff and also demonstrated don doff of brace sequence to pt and spouse Lower Body Dressing: Maximal assistance   Toilet Transfer: Regular Toilet;Rolling walker (2 wheels);Grab bars;Moderate assistance Toilet Transfer Details  (indicate cue type and reason): pt needed rocking momentum and bil UE to transfer off standard commode. pt could benefit from 3n1 while acute Toileting- Clothing Manipulation and Hygiene: Total assistance Toileting - Clothing Manipulation Details (indicate cue type and reason): not able to void at this time.     Functional mobility during ADLs: Minimal assistance;Rolling walker (2 wheels) General ADL Comments: pt reports foley d/c at 4am and not voiding at this date time. pt reports spasms and pain  Back handout provided and reviewed adls in detail. Pt educated on: clothing between brace, never sleep in brace, set an alarm at night for medication, avoid sitting for long periods of time, correct bed positioning for sleeping, correct sequence for bed mobility, avoiding lifting more than 5 pounds and never wash directly over incision. All education is complete and patient indicates understanding.    Vision Patient Visual Report: No change from baseline       Perception     Praxis      Pertinent Vitals/Pain Pain Assessment Pain Assessment: 0-10 Pain Score: 8  Pain Location: back Pain Descriptors / Indicators: Sore, Operative site guarding, Grimacing, Guarding, Discomfort, Moaning Pain Intervention(s): Monitored during session, Premedicated before session, Repositioned     Hand Dominance Right   Extremity/Trunk Assessment Upper Extremity Assessment Upper Extremity Assessment: Overall WFL for tasks assessed   Lower Extremity Assessment Lower Extremity Assessment: Defer to PT evaluation   Cervical / Trunk Assessment Cervical / Trunk Assessment: Back Surgery   Communication Communication Communication: No difficulties   Cognition Arousal/Alertness: Awake/alert Behavior During Therapy: WFL for tasks assessed/performed Overall Cognitive Status: Within Functional Limits for tasks assessed                                 General Comments: distracted by pain, needing  repetition at times     General Comments  dressing intact and dry at this time.    Exercises     Shoulder Instructions      Home Living Family/patient expects to be discharged to:: Private residence Living Arrangements: Spouse/significant other;Children;Other relatives Available Help at Discharge: Family;Available PRN/intermittently Type of Home: House Home Access: Level entry     Home Layout: One level     Bathroom Shower/Tub: Occupational psychologist: Handicapped height Bathroom Accessibility: Yes   Home Equipment: Conservation officer, nature (2 wheels);BSC/3in1;Shower seat;Wheelchair - manual   Additional Comments: spouse reports 15 people living in house including grandchildren and great grandchildren. retired Therapist, sports      Prior Functioning/Environment Prior Level of Function : Independent/Modified Independent             Mobility Comments: Independent with mobility, drives, loves to line dance ADLs Comments: independent        OT Problem List: Decreased strength;Impaired balance (sitting and/or standing);Decreased safety awareness;Decreased knowledge of use of DME or AE;Decreased knowledge of precautions;Obesity;Pain      OT Treatment/Interventions: Self-care/ADL training;Therapeutic exercise;Energy conservation;Manual therapy;DME and/or AE instruction;Modalities;Therapeutic activities;Patient/family education;Balance training;Cognitive remediation/compensation;Neuromuscular education    OT Goals(Current goals can be found in the care plan section) Acute Rehab OT Goals Patient Stated Goal: to stay in hospital until monday to recover OT Goal Formulation: With patient/family Potential  to Achieve Goals: Good  OT Frequency: Min 2X/week    Co-evaluation              AM-PAC OT "6 Clicks" Daily Activity     Outcome Measure Help from another person eating meals?: A Little Help from another person taking care of personal grooming?: A Little Help from another  person toileting, which includes using toliet, bedpan, or urinal?: A Lot Help from another person bathing (including washing, rinsing, drying)?: A Lot Help from another person to put on and taking off regular upper body clothing?: A Little Help from another person to put on and taking off regular lower body clothing?: A Lot 6 Click Score: 15   End of Session Equipment Utilized During Treatment: Gait belt;Rolling walker (2 wheels) Nurse Communication: Mobility status;Precautions  Activity Tolerance: Patient tolerated treatment well Patient left: in bed;with call bell/phone within reach;with family/visitor present (pending transfer to new room)  OT Visit Diagnosis: Unsteadiness on feet (R26.81);Muscle weakness (generalized) (M62.81)                Time: 1000-1030 OT Time Calculation (min): 30 min Charges:  OT General Charges $OT Visit: 1 Visit OT Evaluation $OT Eval Moderate Complexity: 1 Mod OT Treatments $Self Care/Home Management : 8-22 mins   Brynn, OTR/L  Acute Rehabilitation Services Office: [phone] .   Jeri Modena 03/15/2022, 11:55 AM

## 2022-03-15 NOTE — Progress Notes (Signed)
Patient was straight cath using sterile technique. 600 urine output. Patient tolerated procedure well.

## 2022-03-15 NOTE — Evaluation (Signed)
Physical Therapy Evaluation Patient Details Name: Tammy Pineda MRN: [id number] DOB: 1945-09-04 Today's Date: 03/15/2022  History of Present Illness  Patient is a 77 y/o female who presents on 2/8 for L 3-4, 4-5 TLIF. PMH includes HTN, glaucoma, CKD, CAD, depression, anxiety, DM.  Clinical Impression  Patient presents with pain, weakness and post surgical deficits s/p above surgery. Pt is independent at baseline and lives with her spouse and 1 other people in a 3 level home. She drives and loves to line dance. Today, pt limited mainly by pain. Requires Mod A for bed mobility and standing and min A for short distance ambulation with close chair follow due to bil knee instability/buckling and weakness needing 2 seated rest breaks with gait training. Education re: back precautions, log roll technique, positioning, activity progression etc. Pt reports she does not want to go to rehab and will have necessary support at home. Will follow acutely to maximize independence and mobility prior to return home.     Recommendations for follow up therapy are one component of a multi-disciplinary discharge planning process, led by the attending physician.  Recommendations may be updated based on patient status, additional functional criteria and insurance authorization.  Follow Up Recommendations Home health PT      Assistance Recommended at Discharge Frequent or constant Supervision/Assistance  Patient can return home with the following  A lot of help with walking and/or transfers;A little help with bathing/dressing/bathroom;Assist for transportation;Assistance with cooking/housework    Equipment Recommendations None recommended by PT  Recommendations for Other Services       Functional Status Assessment Patient has had a recent decline in their functional status and demonstrates the ability to make significant improvements in function in a reasonable and predictable amount of time.     Precautions /  Restrictions Precautions Precautions: Back Precaution Booklet Issued: Yes (comment) Precaution Comments: Reviewed handout and precautions Restrictions Weight Bearing Restrictions: No      Mobility  Bed Mobility Overal bed mobility: Needs Assistance Bed Mobility: Rolling, Sidelying to Sit, Sit to Sidelying Rolling: Mod assist Sidelying to sit: HOB elevated, Mod assist     Sit to sidelying: Mod assist General bed mobility comments: Step by step cues for log roll technique and sequencing, use of rail and HOB slightly elevated as pt can do this at home, increased time. Assist to bring LEs back itno bed.    Transfers Overall transfer level: Needs assistance Equipment used: Rolling walker (2 wheels) Transfers: Sit to/from Stand Sit to Stand: Min assist, Mod assist           General transfer comment: Min-Mod A to power to standing with cues for hand placement/technique from EOB x, from w/c x2. Wanting to pull up on RW despite cues    Ambulation/Gait Ambulation/Gait assistance: Min assist, +2 safety/equipment Gait Distance (Feet): 16 Feet (+ 15' + 20') Assistive device: Rolling walker (2 wheels) Gait Pattern/deviations: Step-to pattern, Step-through pattern, Decreased stride length, Trunk flexed, Narrow base of support, Knee flexed in stance - left, Knee flexed in stance - right Gait velocity: decreased     General Gait Details: Slow, unsteady gait with bil knee instability throughout gait with cues for knee extension and upright. 2 seated rest breaks due to weakness/fatigue.  Stairs            Wheelchair Mobility    Modified Rankin (Stroke Patients Only)       Balance Overall balance assessment: Needs assistance Sitting-balance support: Feet supported, Bilateral upper extremity supported  Sitting balance-Leahy Scale: Fair Sitting balance - Comments: UE support due to pain   Standing balance support: During functional activity, Bilateral upper extremity  supported, Reliant on assistive device for balance Standing balance-Leahy Scale: Poor Standing balance comment: Requires UE support on RW and external support due to BLE weakness, and knee instability/buckling                             Pertinent Vitals/Pain Pain Assessment Pain Assessment: 0-10 Pain Score: 10-Worst pain ever Pain Location: back Pain Descriptors / Indicators: Sore, Operative site guarding, Grimacing, Guarding, Discomfort, Moaning Pain Intervention(s): Monitored during session, Repositioned, Limited activity within patient's tolerance    Home Living Family/patient expects to be discharged to:: Private residence Living Arrangements: Spouse/significant other;Children;Other relatives (lives with 76 other people) Available Help at Discharge: Family;Available PRN/intermittently Type of Home: House Home Access: Level entry       Home Layout: One level Home Equipment: Conservation officer, nature (2 wheels);BSC/3in1;Shower seat;Wheelchair - manual Additional Comments: Has a full house and people will be able to help her at home    Prior Function Prior Level of Function : Independent/Modified Independent             Mobility Comments: Independent with mobility, drives, loves to line dance ADLs Comments: independent     Hand Dominance   Dominant Hand: Right    Extremity/Trunk Assessment   Upper Extremity Assessment Upper Extremity Assessment: Defer to OT evaluation    Lower Extremity Assessment Lower Extremity Assessment: Generalized weakness (knee instabiltiy noted bilaterally with walking/functional tasks, sensation WFLs)    Cervical / Trunk Assessment Cervical / Trunk Assessment: Back Surgery  Communication   Communication: No difficulties  Cognition Arousal/Alertness: Awake/alert Behavior During Therapy: WFL for tasks assessed/performed Overall Cognitive Status: Within Functional Limits for tasks assessed                                  General Comments: distracted by pain, needing repetition at times        General Comments General comments (skin integrity, edema, etc.): Bandage- clean dry and intact    Exercises     Assessment/Plan    PT Assessment Patient needs continued PT services  PT Problem List Decreased strength;Decreased mobility;Pain;Decreased skin integrity;Decreased activity tolerance;Decreased knowledge of precautions       PT Treatment Interventions Therapeutic activities;DME instruction;Gait training;Patient/family education;Balance training;Therapeutic exercise;Functional mobility training;Neuromuscular re-education    PT Goals (Current goals can be found in the Care Plan section)  Acute Rehab PT Goals Patient Stated Goal: to get back to line dancing PT Goal Formulation: With patient Time For Goal Achievement: 03/29/22 Potential to Achieve Goals: Good    Frequency Min 5X/week     Co-evaluation               AM-PAC PT "6 Clicks" Mobility  Outcome Measure Help needed turning from your back to your side while in a flat bed without using bedrails?: A Lot Help needed moving from lying on your back to sitting on the side of a flat bed without using bedrails?: A Lot Help needed moving to and from a bed to a chair (including a wheelchair)?: A Lot Help needed standing up from a chair using your arms (e.g., wheelchair or bedside chair)?: A Lot Help needed to walk in hospital room?: A Lot Help needed climbing 3-5 steps with a railing? :  Total 6 Click Score: 11    End of Session Equipment Utilized During Treatment: Gait belt Activity Tolerance: Patient limited by pain Patient left: in bed;with call bell/phone within reach Nurse Communication: Mobility status PT Visit Diagnosis: Pain;Muscle weakness (generalized) (M62.81);Difficulty in walking, not elsewhere classified (R26.2) Pain - part of body:  (back)    Time: [id number] PT Time Calculation (min) (ACUTE ONLY): 46  min   Charges:   PT Evaluation $PT Eval Moderate Complexity: 1 Mod PT Treatments $Gait Training: 8-22 mins $Therapeutic Activity: 8-22 mins        Marisa Severin, PT, DPT Acute Rehabilitation Services Secure chat preferred Office [place] 03/15/2022, 8:59 AM

## 2022-03-16 LAB — GLUCOSE, CAPILLARY
Glucose-Capillary: 167 mg/dL — ABNORMAL HIGH (ref 70–99)
Glucose-Capillary: 163 mg/dL — ABNORMAL HIGH (ref 70–99)
Glucose-Capillary: 107 mg/dL — ABNORMAL HIGH (ref 70–99)
Glucose-Capillary: 120 mg/dL — ABNORMAL HIGH (ref 70–99)

## 2022-03-16 NOTE — Plan of Care (Signed)
  Problem: Education: Goal: Knowledge of General Education information will improve Description: Including pain rating scale, medication(s)/side effects and non-pharmacologic comfort measures Outcome: Progressing   Problem: Health Behavior/Discharge Planning: Goal: Ability to manage health-related needs will improve Outcome: Progressing   Problem: Clinical Measurements: Goal: Ability to maintain clinical measurements within normal limits will improve Outcome: Progressing Goal: Will remain free from infection Outcome: Progressing Goal: Diagnostic test results will improve Outcome: Progressing Goal: Respiratory complications will improve Outcome: Progressing Goal: Cardiovascular complication will be avoided Outcome: Progressing   Problem: Activity: Goal: Risk for activity intolerance will decrease Outcome: Progressing   Problem: Nutrition: Goal: Adequate nutrition will be maintained Outcome: Progressing   Problem: Coping: Goal: Level of anxiety will decrease Outcome: Progressing   Problem: Elimination: Goal: Will not experience complications related to bowel motility Outcome: Progressing Goal: Will not experience complications related to urinary retention Outcome: Progressing   Problem: Pain Managment: Goal: General experience of comfort will improve Outcome: Progressing   Problem: Safety: Goal: Ability to remain free from injury will improve Outcome: Progressing   Problem: Skin Integrity: Goal: Risk for impaired skin integrity will decrease Outcome: Progressing   Problem: Education: Goal: Ability to verbalize activity precautions or restrictions will improve Outcome: Progressing Goal: Knowledge of the prescribed therapeutic regimen will improve Outcome: Progressing Goal: Understanding of discharge needs will improve Outcome: Progressing   Problem: Activity: Goal: Ability to avoid complications of mobility impairment will improve Outcome: Progressing Goal:  Ability to tolerate increased activity will improve Outcome: Progressing Goal: Will remain free from falls Outcome: Progressing   Problem: Bowel/Gastric: Goal: Gastrointestinal status for postoperative course will improve Outcome: Progressing   Problem: Clinical Measurements: Goal: Ability to maintain clinical measurements within normal limits will improve Outcome: Progressing Goal: Postoperative complications will be avoided or minimized Outcome: Progressing Goal: Diagnostic test results will improve Outcome: Progressing   Problem: Pain Management: Goal: Pain level will decrease Outcome: Progressing   Problem: Skin Integrity: Goal: Will show signs of wound healing Outcome: Progressing   Problem: Health Behavior/Discharge Planning: Goal: Identification of resources available to assist in meeting health care needs will improve Outcome: Progressing   Problem: Bladder/Genitourinary: Goal: Urinary functional status for postoperative course will improve Outcome: Progressing   Problem: Education: Goal: Ability to describe self-care measures that may prevent or decrease complications (Diabetes Survival Skills Education) will improve Outcome: Progressing Goal: Individualized Educational Video(s) Outcome: Progressing   Problem: Coping: Goal: Ability to adjust to condition or change in health will improve Outcome: Progressing   Problem: Fluid Volume: Goal: Ability to maintain a balanced intake and output will improve Outcome: Progressing   Problem: Health Behavior/Discharge Planning: Goal: Ability to identify and utilize available resources and services will improve Outcome: Progressing Goal: Ability to manage health-related needs will improve Outcome: Progressing   Problem: Metabolic: Goal: Ability to maintain appropriate glucose levels will improve Outcome: Progressing   Problem: Nutritional: Goal: Maintenance of adequate nutrition will improve Outcome:  Progressing Goal: Progress toward achieving an optimal weight will improve Outcome: Progressing   Problem: Skin Integrity: Goal: Risk for impaired skin integrity will decrease Outcome: Progressing   Problem: Tissue Perfusion: Goal: Adequacy of tissue perfusion will improve Outcome: Progressing

## 2022-03-16 NOTE — Progress Notes (Signed)
NEUROSURGERY PROGRESS NOTE  Doing well. Complains of appropriate back soreness. No leg pain No numbness, tingling or weakness Ambulating and voiding well Good strength and sensation Incision CDI  Temp:  [98.1 F (36.7 C)-100.5 F (38.1 C)] 100.5 F (38.1 C) (02/10 0423) Pulse Rate:  [86-96] 91 (02/10 0423) Resp:  [16-17] 16 (02/09 1939) BP: (131-144)/(45-56) 140/48 (02/10 0423) SpO2:  [91 %-97 %] 94 % (02/10 0423) FiO2 (%):  [21 %] 21 % (02/09 2256)  Plan: Continue therapy today and home tomorrow. Patient is having some urinary retention. Patient has been able to void twice since last I&O. Please get rid of the pure wick. Patient is able to ambulate to the bathroom. We will need to get a PVR after she urinates next time.   Eleonore Chiquito, NP 03/16/2022 5:35 AM

## 2022-03-16 NOTE — Progress Notes (Signed)
Occupational Therapy Treatment Patient Details Name: Tammy Pineda MRN: [id number] DOB: Jun 27, 1945 Today's Date: 03/16/2022   History of present illness 77 y/o female who presents on 2/8 for L 3-4, 4-5 TLIF. PMH includes HTN, glaucoma, CKD, CAD, depression, anxiety, DM.   OT comments  Pt. Seen for skilled OT session.  Pt. Able to complete multiple sit/stands requiring max a.  Pivotal steps to recliner +2 assist for safety and secondary to knee buckling.  Pt. Moving better towards end of session but able to side step towards hob for back to bed.  D/c recommendations may need to be updated to short term SNF if pt. Not able to progress to safer level of assist for home environment.  Will cont. To progress with current poc.     Recommendations for follow up therapy are one component of a multi-disciplinary discharge planning process, led by the attending physician.  Recommendations may be updated based on patient status, additional functional criteria and insurance authorization.    Follow Up Recommendations  Home health OT     Assistance Recommended at Discharge Intermittent Supervision/Assistance  Patient can return home with the following  A lot of help with bathing/dressing/bathroom;A little help with walking and/or transfers   Equipment Recommendations  None recommended by OT    Recommendations for Other Services      Precautions / Restrictions Precautions Precautions: Back Precaution Booklet Issued: Yes (comment) Precaution Comments: Reviewed handout and precautions Restrictions Weight Bearing Restrictions: No       Mobility Bed Mobility Overal bed mobility: Needs Assistance Bed Mobility: Sit to Sidelying, Rolling Rolling: Mod assist       Sit to sidelying: Max assist General bed mobility comments: husband assisted with bringing bles into bed.  pt. required assistance to roll onto back initially wanting side lying but was not comfortable    Transfers Overall  transfer level: Needs assistance Equipment used: Rolling walker (2 wheels) Transfers: Sit to/from Stand, Bed to chair/wheelchair/BSC Sit to Stand: Max assist Stand pivot transfers: Mod assist, Max assist   Step pivot transfers: Mod assist, Max assist     General transfer comment: sit/stand x3, 2nd attempt pt. with noted buckling on R leg. assisted back to sitting, PT nearby and assisted with 3rd sit/stand and pt. able to stand up much easier ( rest break may have helped and also that it was her 3rd sit/stand).  able to take pivotal steps to eob, sit, then stand again and side step towards hob in prep for back to bed     Balance                                           ADL either performed or assessed with clinical judgement   ADL Overall ADL's : Needs assistance/impaired                 Upper Body Dressing : With caregiver independent assisting;Cueing for sequencing Upper Body Dressing Details (indicate cue type and reason): education/demo for pt. and spouse how to don/doff brace     Toilet Transfer: Maximal assistance;Cueing for safety;Cueing for sequencing;Stand-pivot;Rolling walker (2 wheels) Toilet Transfer Details (indicate cue type and reason): simulated during transfer from recliner to bed         Functional mobility during ADLs: Moderate assistance;Cueing for sequencing;Cueing for safety;Rolling walker (2 wheels) General ADL Comments: several attempts at standing then able to  sit/stand with less assistance towards end of session    Extremity/Trunk Assessment              Vision       Perception     Praxis      Cognition Arousal/Alertness: Awake/alert Behavior During Therapy: Flat affect Overall Cognitive Status: Within Functional Limits for tasks assessed                                          Exercises      Shoulder Instructions       General Comments Unable to void    Pertinent Vitals/ Pain        Pain Assessment Pain Assessment: 0-10 Pain Score: 9  Pain Location: back Pain Descriptors / Indicators: Sore, Operative site guarding, Grimacing, Guarding, Discomfort, Moaning Pain Intervention(s): Limited activity within patient's tolerance, Monitored during session, Repositioned  Home Living                                          Prior Functioning/Environment              Frequency  Min 2X/week        Progress Toward Goals  OT Goals(current goals can now be found in the care plan section)  Progress towards OT goals: Progressing toward goals     Plan Discharge plan remains appropriate    Co-evaluation                 AM-PAC OT "6 Clicks" Daily Activity     Outcome Measure   Help from another person eating meals?: A Little Help from another person taking care of personal grooming?: A Little Help from another person toileting, which includes using toliet, bedpan, or urinal?: A Lot Help from another person bathing (including washing, rinsing, drying)?: A Lot Help from another person to put on and taking off regular upper body clothing?: A Little Help from another person to put on and taking off regular lower body clothing?: A Lot 6 Click Score: 15    End of Session Equipment Utilized During Treatment: Gait belt;Rolling walker (2 wheels)  OT Visit Diagnosis: Unsteadiness on feet (R26.81);Muscle weakness (generalized) (M62.81)   Activity Tolerance Patient tolerated treatment well   Patient Left in bed;with call bell/phone within reach   Nurse Communication          Time: 1133-1200 OT Time Calculation (min): 27 min  Charges: OT General Charges $OT Visit: 1 Visit OT Treatments $Self Care/Home Management : 23-37 mins  Sonia Baller, COTA/L Acute Rehabilitation [phone]   Clearnce Sorrel Lorraine-COTA/L 03/16/2022, 1:48 PM

## 2022-03-16 NOTE — Progress Notes (Signed)
Physical Therapy Treatment Patient Details Name: Tammy Pineda MRN: [id number] DOB: 1945-11-06 Today's Date: 03/16/2022   History of Present Illness 77 y/o female who presents on 2/8 for L 3-4, 4-5 TLIF. PMH includes HTN, glaucoma, CKD, CAD, depression, anxiety, DM.    PT Comments    Patient with significant difficulty mobilizing today. Required up to max assist with transfer, mod assist with simple bed mobility. LEs buckling, lacks full extension of knees. Husband participated with session today; feels pain medication may be causing difficulty and this certainly could explain the difference in function from yesterday. Based on how she is currently performing, pt would need skilled care (have updated recs.) I am very hopeful she will move better this evening with staff and a member of rehab will follow up tomorrow to further assess her progress. If there is no significant improvement however, husband would not be able to adequately care for pt at this level and would consider SNF as an option until safely able to transfer at a min guard level. Patient will continue to benefit from skilled physical therapy services to further improve independence with functional mobility.    Recommendations for follow up therapy are one component of a multi-disciplinary discharge planning process, led by the attending physician.  Recommendations may be updated based on patient status, additional functional criteria and insurance authorization.  Follow Up Recommendations  Skilled nursing-short term rehab (<3 hours/day) Can patient physically be transported by private vehicle: No   Assistance Recommended at Discharge Frequent or constant Supervision/Assistance  Patient can return home with the following A lot of help with walking and/or transfers;A little help with bathing/dressing/bathroom;Assist for transportation;Assistance with cooking/housework   Equipment Recommendations  None recommended by PT     Recommendations for Other Services       Precautions / Restrictions Precautions Precautions: Back Precaution Booklet Issued: Yes (comment) Precaution Comments: Reviewed handout and precautions Restrictions Weight Bearing Restrictions: No     Mobility  Bed Mobility Overal bed mobility: Needs Assistance Bed Mobility: Rolling, Sit to Supine Rolling: Mod assist Sidelying to sit: Mod assist       General bed mobility comments: Mod assist with support for RLE to flex, and heavy assist to roll onto left side followed by Assist to pull LEs to EOB and raise trunk. Cues throughout with pt needing significant assist.    Transfers Overall transfer level: Needs assistance Equipment used: Rolling walker (2 wheels) Transfers: Sit to/from Stand, Bed to chair/wheelchair/BSC Sit to Stand: Max assist   Step pivot transfers: Max assist       General transfer comment: Max assist for boost to stand and pivot towards chair. LEs buckling Rt>Lt. Leaning posteriorly. Cues for knee extension and forward weight shift. Sits spontaneously. Performed from bed a few times, and BSC.    Ambulation/Gait               General Gait Details: Unable to safely ambulate   Stairs             Wheelchair Mobility    Modified Rankin (Stroke Patients Only)       Balance Overall balance assessment: Needs assistance Sitting-balance support: Feet supported, Bilateral upper extremity supported Sitting balance-Leahy Scale: Poor Sitting balance - Comments: LOB Postural control: Posterior lean Standing balance support: During functional activity, Bilateral upper extremity supported, Reliant on assistive device for balance Standing balance-Leahy Scale: Poor Standing balance comment: LEs buckling. Needs UE support at all times.  Cognition Arousal/Alertness: Suspect due to medications, Lethargic Behavior During Therapy: WFL for tasks  assessed/performed Overall Cognitive Status: Within Functional Limits for tasks assessed                                 General Comments: distracted by pain, needing repetition at times        Exercises      General Comments General comments (skin integrity, edema, etc.): Unable to void      Pertinent Vitals/Pain Pain Assessment Pain Assessment: 0-10 Pain Score: 8  Pain Location: back Pain Descriptors / Indicators: Sore, Operative site guarding, Grimacing, Guarding, Discomfort, Moaning Pain Intervention(s): Monitored during session, Limited activity within patient's tolerance, Premedicated before session, Repositioned    Home Living                          Prior Function            PT Goals (current goals can now be found in the care plan section) Acute Rehab PT Goals PT Goal Formulation: With patient/family Time For Goal Achievement: 03/29/22 Potential to Achieve Goals: Fair Progress towards PT goals: Not progressing toward goals - comment    Frequency    Min 5X/week      PT Plan Discharge plan needs to be updated    Co-evaluation              AM-PAC PT "6 Clicks" Mobility   Outcome Measure  Help needed turning from your back to your side while in a flat bed without using bedrails?: A Lot Help needed moving from lying on your back to sitting on the side of a flat bed without using bedrails?: A Lot Help needed moving to and from a bed to a chair (including a wheelchair)?: A Lot Help needed standing up from a chair using your arms (e.g., wheelchair or bedside chair)?: A Lot Help needed to walk in hospital room?: A Lot Help needed climbing 3-5 steps with a railing? : Total 6 Click Score: 11    End of Session Equipment Utilized During Treatment: Gait belt;Back brace Activity Tolerance: Patient limited by lethargy Patient left: in chair;with call bell/phone within reach;with chair alarm set;with family/visitor present Nurse  Communication: Mobility status;Need for lift equipment (spoke with only nurse on floor at time of evaluation) PT Visit Diagnosis: Pain;Muscle weakness (generalized) (M62.81);Difficulty in walking, not elsewhere classified (R26.2) Pain - part of body:  (back and legs)     Time: [id number] PT Time Calculation (min) (ACUTE ONLY): 46 min  Charges:  $Therapeutic Activity: 23-37 mins $Self Care/Home Management: [place], PT, DPT Physical Therapist Acute Rehabilitation Services [place] 03/16/2022, 12:02 PM

## 2022-03-17 LAB — GLUCOSE, CAPILLARY
Glucose-Capillary: 125 mg/dL — ABNORMAL HIGH (ref 70–99)
Glucose-Capillary: 184 mg/dL — ABNORMAL HIGH (ref 70–99)
Glucose-Capillary: 92 mg/dL (ref 70–99)
Glucose-Capillary: 79 mg/dL (ref 70–99)

## 2022-03-17 NOTE — Progress Notes (Signed)
Mobility Specialist Progress Note   03/17/22 1400  Mobility  Activity Ambulated with assistance in hallway  Level of Assistance +2 (takes two people) (Chair follow)  Assistive Device Front wheel walker  Distance Ambulated (ft) 74 ft  Activity Response Tolerated well  Mobility Referral Yes  $Mobility charge 1 Mobility   Received in bed having (5/10) pain in mid back but agreeable to mobility. ModA to EOB and minA to stand from an elevated surface. Min cues needed on posture but CGA during ambulation. X1 seated rest break d/t increase in pain, pt able to ambulate a little farther before requesting to be rolled back in chair. Returned back to bed w/ modA d/t fatigue, pain and spasms in BLE's. Left w/ call bell in reach and all needs met. RN notified about spasms.  Holland Falling Mobility Specialist Please contact via SecureChat or  Rehab office at [phone]

## 2022-03-17 NOTE — Progress Notes (Signed)
NEUROSURGERY PROGRESS NOTE  Doing ok. Complains of appropriate back soreness. Incision CDI Has not been ambulating very much.  She needs aggressive therapy and ambulate the hallways at least three times a shift. PT recommending SNF  Temp:  [98.2 F (36.8 C)-98.3 F (36.8 C)] 98.2 F (36.8 C) (02/11 0800) Pulse Rate:  [80-87] 83 (02/11 0800) Resp:  [18] 18 (02/11 0800) BP: (118-125)/(42-58) 124/48 (02/11 0800) SpO2:  [90 %-96 %] 91 % (02/11 0800)    Eleonore Chiquito, NP 03/17/2022 9:00 AM

## 2022-03-17 NOTE — Progress Notes (Signed)
Physical Therapy Treatment Patient Details Name: Tammy Pineda MRN: [id number] DOB: 1945/05/16 Today's Date: 03/17/2022   History of Present Illness 77 y/o female who presents on 2/8 for L 3-4, 4-5 TLIF. PMH includes HTN, glaucoma, CKD, CAD, depression, anxiety, DM.    PT Comments    Pt progressing slowly towards her physical therapy goals. Premedicated prior to session; pt reporting BLE spasms and RN present to provide additional medication during session. Pt requiring moderate assist for transfers; ambulating 10 ft, 5 ft, 5 ft with a walker and seated rest breaks in between bouts. Continues with weakness, decreased activity tolerance, and impaired standing balance. Will continue to progress as tolerated.    Recommendations for follow up therapy are one component of a multi-disciplinary discharge planning process, led by the attending physician.  Recommendations may be updated based on patient status, additional functional criteria and insurance authorization.  Follow Up Recommendations  Skilled nursing-short term rehab (<3 hours/day) (potential to progress home with HHPT) Can patient physically be transported by private vehicle: No   Assistance Recommended at Discharge Frequent or constant Supervision/Assistance  Patient can return home with the following A lot of help with walking and/or transfers;A little help with bathing/dressing/bathroom;Assist for transportation;Assistance with cooking/housework   Equipment Recommendations  None recommended by PT    Recommendations for Other Services       Precautions / Restrictions Precautions Precautions: Back Precaution Booklet Issued: Yes (comment) Precaution Comments: Reviewed handout and precautions Restrictions Weight Bearing Restrictions: No     Mobility  Bed Mobility               General bed mobility comments: OOB in chair    Transfers Overall transfer level: Needs assistance Equipment used: Rolling walker (2  wheels) Transfers: Sit to/from Stand Sit to Stand: Mod assist           General transfer comment: ModA to power up from recliner and toilet riser multiple times. Increased time/effort    Ambulation/Gait Ambulation/Gait assistance: Tammy assist, +2 safety/equipment Gait Distance (Feet): 20 Feet (10", 5", 5") Assistive device: Rolling walker (2 wheels) Gait Pattern/deviations: Step-to pattern, Step-through pattern, Decreased stride length, Trunk flexed, Narrow base of support, Knee flexed in stance - left, Knee flexed in stance - right       General Gait Details: Bilateral knee instability, but no knee buckle. Overall good posture and proximity to walker. MinA for balance and close chair follow utilized. Seated rest break in between bouts   Stairs             Wheelchair Mobility    Modified Rankin (Stroke Patients Only)       Balance Overall balance assessment: Needs assistance Sitting-balance support: Feet supported, Bilateral upper extremity supported Sitting balance-Leahy Scale: Poor     Standing balance support: During functional activity, Bilateral upper extremity supported, Reliant on assistive device for balance Standing balance-Leahy Scale: Poor Standing balance comment: Needs UE support at all times.                            Cognition Arousal/Alertness: Suspect due to medications, Awake/alert Behavior During Therapy: WFL for tasks assessed/performed Overall Cognitive Status: Within Functional Limits for tasks assessed                                          Exercises General Exercises -  Lower Extremity Ankle Circles/Pumps: Both, 10 reps, Seated Quad Sets: Both, 10 reps, Seated Long Arc Quad: Both, 5 reps, Seated    General Comments        Pertinent Vitals/Pain Pain Assessment Pain Assessment: Faces Faces Pain Scale: Hurts whole lot Pain Location: back, BLE's Pain Descriptors / Indicators: Sore, Operative site  guarding, Grimacing, Guarding, Discomfort, Moaning, Spasm Pain Intervention(s): Limited activity within patient's tolerance, Monitored during session, Premedicated before session, Patient requesting pain meds-RN notified    Home Living                          Prior Function            PT Goals (current goals can now be found in the care plan section) Acute Rehab PT Goals PT Goal Formulation: With patient/family Time For Goal Achievement: 03/29/22 Potential to Achieve Goals: Fair Progress towards PT goals: Progressing toward goals    Frequency    Tammy 5X/week      PT Plan Current plan remains appropriate    Co-evaluation              AM-PAC PT "6 Clicks" Mobility   Outcome Measure  Help needed turning from your back to your side while in a flat bed without using bedrails?: A Lot Help needed moving from lying on your back to sitting on the side of a flat bed without using bedrails?: A Lot Help needed moving to and from a bed to a chair (including a wheelchair)?: A Lot Help needed standing up from a chair using your arms (e.g., wheelchair or bedside chair)?: A Lot Help needed to walk in hospital room?: A Little Help needed climbing 3-5 steps with a railing? : Total 6 Click Score: 12    End of Session Equipment Utilized During Treatment: Gait belt;Back brace Activity Tolerance: Patient tolerated treatment well Patient left: in chair;with call bell/phone within reach;with family/visitor present Nurse Communication: Mobility status PT Visit Diagnosis: Pain;Muscle weakness (generalized) (M62.81);Difficulty in walking, not elsewhere classified (R26.2) Pain - part of body:  (back and legs)     Time: [id number] PT Time Calculation (Tammy) (ACUTE ONLY): 31 Tammy  Charges:  $Therapeutic Activity: 23-37 mins                     Tammy Pineda, PT, DPT Acute Rehabilitation Services Office [phone]    Tammy Pineda 03/17/2022, 10:11 AM

## 2022-03-18 LAB — GLUCOSE, CAPILLARY
Glucose-Capillary: 107 mg/dL — ABNORMAL HIGH (ref 70–99)
Glucose-Capillary: 123 mg/dL — ABNORMAL HIGH (ref 70–99)
Glucose-Capillary: 129 mg/dL — ABNORMAL HIGH (ref 70–99)
Glucose-Capillary: 137 mg/dL — ABNORMAL HIGH (ref 70–99)
Glucose-Capillary: 148 mg/dL — ABNORMAL HIGH (ref 70–99)

## 2022-03-18 MED ORDER — TRAMADOL HCL 50 MG PO TABS
50.0000 mg | ORAL_TABLET | Freq: Four times a day (QID) | ORAL | Status: DC | PRN
  Administered 2022-03-18 (×2): 50 mg via ORAL
  Filled 2022-03-18 (×2): qty 1

## 2022-03-18 MED ORDER — BISACODYL 5 MG PO TBEC
5.0000 mg | DELAYED_RELEASE_TABLET | Freq: Every day | ORAL | Status: DC | PRN
  Administered 2022-03-19 – 2022-03-20 (×2): 5 mg via ORAL
  Filled 2022-03-18 (×2): qty 1

## 2022-03-18 NOTE — Progress Notes (Signed)
   Providing Compassionate, Quality Care - Together  NEUROSURGERY PROGRESS NOTE   S: No issues overnight. Slowly improving  O: EXAM:  BP (!) 129/37 (BP Location: Right Arm)   Pulse 72   Temp 98.4 F (36.9 C) (Oral)   Resp 16   Ht '5\' 5"'$  (1.651 m)   Wt 79.8 kg   SpO2 96%   BMI 29.29 kg/m   Awake, alert, oriented  PERRL Speech fluent, appropriate  CNs grossly intact  5/5 BUE/BLE   ASSESSMENT:  77 y.o. female with   L3-5 stenosis, spondylosis   S/p L3-5 TLIF  PLAN: -Rehab eval   Thank you for allowing me to participate in this patient's care.  Please do not hesitate to call with questions or concerns.   Elwin Sleight, [facility] Cell: [phone]

## 2022-03-18 NOTE — Progress Notes (Signed)
Physical Therapy Treatment Patient Details Name: Tammy Pineda MRN: [id number] DOB: [birth year] Today's Date: 03/18/2022   History of Present Illness 77 y/o female who presents on 2/8 for L 3-4, 4-5 TLIF. PMH includes HTN, glaucoma, CKD, CAD, depression, anxiety, DM.    PT Comments    Continuing work on functional mobility and activity tolerance;  Session focused on progressive amb, with notable incr amb distance, and improving activity tolerance; Pt and husband still feel that dc home is too much at this time, and they would like post-acute rehab at our Acute Inpt Rehab floor; They indicated that a bed may be available on [facility] in 2 days; we discussed progress, and this PT highlighted to pt and husband that she still has the ability to make progress enough to dc home (24 hours can make a big difference post back surgery); Still, if she needs it when a bed is available, [facility] will benefit pt; She clearly states she does not want to dc to a SNF for rehab  Recommendations for follow up therapy are one component of a multi-disciplinary discharge planning process, led by the attending physician.  Recommendations may be updated based on patient status, additional functional criteria and insurance authorization.  Follow Up Recommendations  Acute inpatient rehab (3hours/day) (Pt is motivated, and if she qualifies for [facility], she will benefit from [facility] for post-acute rehab); will monitor progress  Can patient physically be transported by private vehicle:  (soon)   Assistance Recommended at Discharge Frequent or constant Supervision/Assistance  Patient can return home with the following A lot of help with walking and/or transfers;A little help with bathing/dressing/bathroom;Assist for transportation;Assistance with cooking/housework   Equipment Recommendations  None recommended by PT    Recommendations for Other Services Rehab consult     Precautions / Restrictions Precautions Precautions:  Back Precaution Booklet Issued: Yes (comment) Precaution Comments: reviewed precautions     Mobility  Bed Mobility Overal bed mobility: Needs Assistance Bed Mobility: Sidelying to Sit   Sidelying to sit: Mod assist       General bed mobility comments: Multimodal cues for technique, and physical assist to elevate trunk    Transfers Overall transfer level: Needs assistance Equipment used: Rolling walker (2 wheels) Transfers: Sit to/from Stand Sit to Stand: Mod assist           General transfer comment: Light mod assist to power up with cues for hand placement    Ambulation/Gait Ambulation/Gait assistance: Min assist Gait Distance (Feet): 35 Feet (x2) Assistive device: Rolling walker (2 wheels) Gait Pattern/deviations: Decreased step length - right, Decreased step length - left Gait velocity: decreased     General Gait Details: Pt feeling shaky, but motivated to ambulate more; painful throughout gait cycle R and LLEs, multiple stops and standing rest breaks due to spasm   Stairs             Wheelchair Mobility    Modified Rankin (Stroke Patients Only)       Balance     Sitting balance-Leahy Scale: Fair       Standing balance-Leahy Scale: Poor                              Cognition Arousal/Alertness: Awake/alert Behavior During Therapy: WFL for tasks assessed/performed Overall Cognitive Status: Within Functional Limits for tasks assessed  General Comments: following commands well        Exercises      General Comments General comments (skin integrity, edema, etc.): NAD on RA      Pertinent Vitals/Pain Pain Assessment Pain Assessment: 0-10 Pain Score: 7  Pain Location: back, BLE's Pain Descriptors / Indicators: Sore, Operative site guarding, Grimacing, Guarding, Discomfort, Spasm Pain Intervention(s): Monitored during session    Home Living                           Prior Function            PT Goals (current goals can now be found in the care plan section) Acute Rehab PT Goals Patient Stated Goal: to get back to line dancing PT Goal Formulation: With patient/family Time For Goal Achievement: 03/29/22 Potential to Achieve Goals: Fair Progress towards PT goals: Progressing toward goals (slowly)    Frequency    Min 5X/week      PT Plan Discharge plan needs to be updated    Co-evaluation              AM-PAC PT "6 Clicks" Mobility   Outcome Measure  Help needed turning from your back to your side while in a flat bed without using bedrails?: A Little Help needed moving from lying on your back to sitting on the side of a flat bed without using bedrails?: A Lot Help needed moving to and from a bed to a chair (including a wheelchair)?: A Lot Help needed standing up from a chair using your arms (e.g., wheelchair or bedside chair)?: A Lot Help needed to walk in hospital room?: A Little Help needed climbing 3-5 steps with a railing? : Total 6 Click Score: 13    End of Session Equipment Utilized During Treatment: Back brace Activity Tolerance: Patient tolerated treatment well Patient left: in chair;with call bell/phone within reach;with family/visitor present Nurse Communication: Mobility status PT Visit Diagnosis: Pain;Muscle weakness (generalized) (M62.81);Difficulty in walking, not elsewhere classified (R26.2) Pain - part of body: Leg (back and bilateral LEs)     Time: [id number] PT Time Calculation (min) (ACUTE ONLY): 31 min  Charges:  $Gait Training: 8-22 mins $Therapeutic Activity: 8-22 mins                     Roney Marion, [place] [facility] [place] 03/18/2022, 3:04 PM

## 2022-03-18 NOTE — Progress Notes (Signed)
Occupational Therapy Treatment Patient Details Name: Tammy Pineda MRN: [id number] DOB: 04-Feb-1946 Today's Date: 03/18/2022   History of present illness 77 y/o female who presents on 2/8 for L 3-4, 4-5 TLIF. PMH includes HTN, glaucoma, CKD, CAD, depression, anxiety, DM.   OT comments  Patient continues to demonstrate gains with OT treatment. Patient continues to require mod assist and cues for bed mobility and donning brace. Patient demonstrated gains with sit to stands and transfers to toilet. Patient also able to tolerate standing at sink for grooming with min guard assist. Patient stating 7/10 for pain this session. Patient states she has assistance at home and would benefit from [facility] to address functional transfers, bathing, and dressing. Acute OT to continue to follow.    Recommendations for follow up therapy are one component of a multi-disciplinary discharge planning process, led by the attending physician.  Recommendations may be updated based on patient status, additional functional criteria and insurance authorization.    Follow Up Recommendations  Home health OT     Assistance Recommended at Discharge Intermittent Supervision/Assistance  Patient can return home with the following  A lot of help with bathing/dressing/bathroom;A little help with walking and/or transfers   Equipment Recommendations  None recommended by OT    Recommendations for Other Services      Precautions / Restrictions Precautions Precautions: Back Precaution Comments: reviewed precautions Restrictions Weight Bearing Restrictions: No       Mobility Bed Mobility Overal bed mobility: Needs Assistance Bed Mobility: Sit to Sidelying, Rolling Rolling: Mod assist Sidelying to sit: Mod assist       General bed mobility comments: instructions on log rolling and mod assist    Transfers Overall transfer level: Needs assistance Equipment used: Rolling walker (2 wheels) Transfers: Sit to/from  Stand, Bed to chair/wheelchair/BSC Sit to Stand: Min assist     Step pivot transfers: Min assist     General transfer comment: min assist to power up with cues for hand placement     Balance Overall balance assessment: Needs assistance Sitting-balance support: Feet supported, Single extremity supported Sitting balance-Leahy Scale: Fair     Standing balance support: Single extremity supported, Bilateral upper extremity supported, During functional activity Standing balance-Leahy Scale: Poor Standing balance comment: able to stand with single extremity support while performing grooming tasks                           ADL either performed or assessed with clinical judgement   ADL Overall ADL's : Needs assistance/impaired     Grooming: Wash/dry hands;Wash/dry face;Oral care;Min guard;Standing Grooming Details (indicate cue type and reason): at sink         Upper Body Dressing : Set up;Sitting Upper Body Dressing Details (indicate cue type and reason): change gown Lower Body Dressing: Maximal assistance Lower Body Dressing Details (indicate cue type and reason): to donn socks Toilet Transfer: Minimal assistance;Ambulation;Regular Toilet;Rolling walker (2 wheels) Toilet Transfer Details (indicate cue type and reason): ambulated to bathroom and used grab bars to assist with lowering to toilet   Toileting - Clothing Manipulation Details (indicate cue type and reason): not able to void at this time.     Functional mobility during ADLs: Minimal assistance General ADL Comments: improvement with mobility with self care    Extremity/Trunk Assessment              Vision       Perception     Praxis  Cognition Arousal/Alertness: Awake/alert Behavior During Therapy: WFL for tasks assessed/performed Overall Cognitive Status: Within Functional Limits for tasks assessed                                 General Comments: following commands  well        Exercises      Shoulder Instructions       General Comments      Pertinent Vitals/ Pain       Pain Assessment Pain Assessment: 0-10 Pain Score: 7  Pain Location: back, BLE's Pain Descriptors / Indicators: Sore, Operative site guarding, Grimacing, Guarding, Discomfort, Spasm Pain Intervention(s): Limited activity within patient's tolerance, Monitored during session, Repositioned  Home Living                                          Prior Functioning/Environment              Frequency  Min 2X/week        Progress Toward Goals  OT Goals(current goals can now be found in the care plan section)  Progress towards OT goals: Progressing toward goals  Acute Rehab OT Goals Patient Stated Goal: get better OT Goal Formulation: With patient Potential to Achieve Goals: Good ADL Goals Pt Will Perform Grooming: with set-up;sitting Additional ADL Goal #1: pt will complete bed mobility mod I as precursor to adls. Additional ADL Goal #2: pt will complete toilet transfer with BSC min guard (A) Additional ADL Goal #3: pt will don doff brace mod I as precursor to basic transfer  Plan Discharge plan remains appropriate    Co-evaluation                 AM-PAC OT "6 Clicks" Daily Activity     Outcome Measure   Help from another person eating meals?: A Little Help from another person taking care of personal grooming?: A Little Help from another person toileting, which includes using toliet, bedpan, or urinal?: A Lot Help from another person bathing (including washing, rinsing, drying)?: A Lot Help from another person to put on and taking off regular upper body clothing?: A Little Help from another person to put on and taking off regular lower body clothing?: A Lot 6 Click Score: 15    End of Session Equipment Utilized During Treatment: Gait belt;Rolling walker (2 wheels)  OT Visit Diagnosis: Unsteadiness on feet (R26.81);Muscle weakness  (generalized) (M62.81)   Activity Tolerance Patient tolerated treatment well   Patient Left in chair;with call bell/phone within reach   Nurse Communication Mobility status;Precautions        Time: [id number] OT Time Calculation (min): 38 min  Charges: OT General Charges $OT Visit: 1 Visit OT Treatments $Self Care/Home Management : 38-52 mins  Lodema Hong, [place]  Office [place] 03/18/2022, 11:29 AM

## 2022-03-18 NOTE — PMR Pre-admission (Signed)
PMR Admission Coordinator Pre-Admission Assessment  Patient: Tammy Pineda is an 77 y.o., female MRN: [id number] DOB: [birth year] Height: 5' 5"$  (165.1 cm) Weight: 79.8 kg  Insurance Information HMO:     PPO:      PCP:      IPA:      80/20:      OTHER:  PRIMARY: Medicare A/B      Policy#: [id number]       Subscriber:  CM Name:       Phone#:      Fax#:  Pre-Cert#:       Employer:  Benefits:  Phone #:      Name:  Eff. Date: 07/05/08 A/B verified online     Deduct: [phone]      Out of Pocket Max:       Life Max:  CIR: 100%      SNF: 20 full days Outpatient: 80%     Co-Pay: 20% Home Health: 100%      Co-Pay:  DME: 80%     Co-Pay: 20% Providers:  SECONDARY: [place]      Policy#: [id number]     Phone#: [phone]  Financial Counselor:       Phone#:   The "Data Collection Information Summary" for patients in Inpatient Rehabilitation Facilities with attached "Privacy Act [facility] Records" was provided and verbally reviewed with: Patient and Family  Emergency Contact Information Contact Information     Name Relation Home Work Mobile   Tammy Pineda Spouse   [phone]       Current Medical History  Patient Admitting Diagnosis: s/p L3-5 TLIF  History of Present Illness: Tammy Pineda is a 77 year old right-handed female with history of type 2 diabetes mellitus, nonobstructive CAD, obesity with BMI 29.29, mild CKD with latest creatinine 0.93, hypertension, hyperlipidemia, glaucoma, left TKA 2021.  Presented 03/14/2022 with chronic right lower extremity radiculopathy and low back pain.  Admission chemistries unremarkable with hemoglobin 11.6, potassium 3.4, glucose 257, creatinine 0.93, surgical PCR/MRSA positive.  MRI revealed severe degenerative spondylosis at L3-4 L4-5 with severe stenosis, lateral recess and foraminal degenerative disc disease with facet arthropathy.  She has failed multiple conservative measures.  Underwent L3-5 decompression and transforaminal  lumbar body fusion 03/14/2022 per Dr. Pieter Partridge Dawley.  Back brace when out of bed.  May remove to shower..  Patient was cleared to begin subcutaneous heparin for DVT prophylaxis 03/19/2022.  Patient is completing course of Bactroban for MRSA PCR screening positive.  Therapy evaluations completed and pt was recommended for a comprehensive rehab program.      Patient's medical record from [facility] has been reviewed by the rehabilitation admission coordinator and physician.  Past Medical History  Past Medical History:  Diagnosis Date   Anxiety    Arthritis    Chronic kidney disease    Coronary artery disease    nonobstructive by cardiac catheterization 2011 with a 30% LAD lesion   Depression    Diabetes (HCC)    Type 2   GERD (gastroesophageal reflux disease)    Glaucoma    Hypercholesteremia    Hypertension    NASH (nonalcoholic steatohepatitis)    NASH   Pneumonia    30 years ago   PVC (premature ventricular contraction)    Seasonal allergies    Sleep apnea    Spinal stenosis     Has the patient had major surgery during 100 days prior to admission? Yes  Family History   family history includes  Colon cancer in her father; Heart attack in her maternal grandfather; Heart block in her brother; Heart disease in her father and mother; Hypertension in her mother; Stroke in her paternal grandmother.  Current Medications  Current Facility-Administered Medications:    0.9 %  sodium chloride infusion, 250 mL, Intravenous, Continuous, Dawley, Troy C, DO, Last Rate: 1 mL/hr at 03/14/22 1744, 250 mL at 03/14/22 1744   ALPRAZolam (XANAX) tablet 0.25 mg, 0.25 mg, Oral, QHS PRN, Dawley, Troy C, DO   bisacodyl (DULCOLAX) EC tablet 5 mg, 5 mg, Oral, Daily PRN, Dawley, Troy C, DO, 5 mg at 03/20/22 [id number]   dicyclomine (BENTYL) capsule 10 mg, 10 mg, Oral, TID PRN, Dawley, Troy C, DO, 10 mg at 03/18/22 1030   docusate sodium (COLACE) capsule 100 mg, 100 mg, Oral, BID, Dawley, Troy C, DO, 100 mg at  03/20/22 0926   escitalopram (LEXAPRO) tablet 10 mg, 10 mg, Oral, QHS, Dawley, Troy C, DO, 10 mg at 03/19/22 2129   glipiZIDE (GLUCOTROL XL) 24 hr tablet 2.5 mg, 2.5 mg, Oral, BID WC, Dawley, Troy C, DO, 2.5 mg at 03/20/22 0751   Glycerin (Adult) 2 g suppository 1 suppository, 1 suppository, Rectal, Daily PRN, Dawley, Troy C, DO, 1 suppository at 03/20/22 0552   heparin injection 5,000 Units, 5,000 Units, Subcutaneous, Q8H, Dawley, Troy C, DO, 5,000 Units at 03/20/22 0523   hydrochlorothiazide (HYDRODIURIL) tablet 12.5 mg, 12.5 mg, Oral, Daily, Dawley, Troy C, DO, 12.5 mg at 03/20/22 0926   HYDROmorphone (DILAUDID) injection 1 mg, 1 mg, Intramuscular, Q4H PRN, Dawley, Troy C, DO, 1 mg at 03/16/22 0420   insulin aspart (novoLOG) injection 0-15 Units, 0-15 Units, Subcutaneous, TID WC, Dawley, Troy C, DO, 3 Units at 03/20/22 0750   insulin aspart (novoLOG) injection 0-5 Units, 0-5 Units, Subcutaneous, QHS, Dawley, Troy C, DO, 2 Units at 03/14/22 2108   losartan (COZAAR) tablet 50 mg, 50 mg, Oral, QHS, Dawley, Troy C, DO, 50 mg at 03/17/22 2143   meclizine (ANTIVERT) tablet 12.5 mg, 12.5 mg, Oral, TID PRN, Dawley, Troy C, DO   menthol-cetylpyridinium (CEPACOL) lozenge 3 mg, 1 lozenge, Oral, PRN **OR** phenol (CHLORASEPTIC) mouth spray 1 spray, 1 spray, Mouth/Throat, PRN, Dawley, Troy C, DO   methocarbamol (ROBAXIN) tablet 500 mg, 500 mg, Oral, Q6H PRN, Dawley, Troy C, DO, 500 mg at 03/19/22 2130   ondansetron (ZOFRAN) tablet 4 mg, 4 mg, Oral, Q6H PRN **OR** ondansetron (ZOFRAN) injection 4 mg, 4 mg, Intravenous, Q6H PRN, Dawley, Troy C, DO   oxyCODONE (Oxy IR/ROXICODONE) immediate release tablet 10 mg, 10 mg, Oral, Q3H PRN, Dawley, Troy C, DO, 10 mg at 03/15/22 2343   oxyCODONE (Oxy IR/ROXICODONE) immediate release tablet 5 mg, 5 mg, Oral, Q3H PRN, Dawley, Troy C, DO, 5 mg at 03/20/22 0926   pantoprazole (PROTONIX) EC tablet 40 mg, 40 mg, Oral, Daily, Dawley, Troy C, DO, 40 mg at 03/20/22 0926    rosuvastatin (CRESTOR) tablet 5 mg, 5 mg, Oral, QHS, Dawley, Troy C, DO, 5 mg at 03/19/22 2130   sodium chloride flush (NS) 0.9 % injection 3 mL, 3 mL, Intravenous, Q12H, Dawley, Troy C, DO, 3 mL at 03/14/22 2103   sodium chloride flush (NS) 0.9 % injection 3 mL, 3 mL, Intravenous, PRN, Dawley, Troy C, DO   traMADol (ULTRAM) tablet 50 mg, 50 mg, Oral, Q6H PRN, Dawley, Troy C, DO, 50 mg at 03/18/22 2346  Facility-Administered Medications Ordered in Other Encounters:    regadenoson (LEXISCAN) injection SOLN 0.4 mg, 0.4  mg, Intravenous, Once, Buford Dresser, MD   technetium tetrofosmin (TC-MYOVIEW) injection 123456 millicurie, 123456 millicurie, Intravenous, Once PRN, Buford Dresser, MD  Patients Current Diet:  Diet Order             Diet Carb Modified Fluid consistency: Thin; Room service appropriate? Yes  Diet effective now                   Precautions / Restrictions Precautions Precautions: Back Precaution Booklet Issued: Yes (comment) Precaution Comments: reviewed precautions Restrictions Weight Bearing Restrictions: No   Has the patient had 2 or more falls or a fall with injury in the past year? No  Prior Activity Level Community (5-7x/wk): fully independent, no DME, driving, likes line dancing  Prior Functional Level Self Care: Did the patient need help bathing, dressing, using the toilet or eating? Independent  Indoor Mobility: Did the patient need assistance with walking from room to room (with or without device)? Independent  Stairs: Did the patient need assistance with internal or external stairs (with or without device)? Independent  Functional Cognition: Did the patient need help planning regular tasks such as shopping or remembering to take medications? Independent  Patient Information Are you of Hispanic, Latino/a,or Spanish origin?: A. No, not of Hispanic, Latino/a, or Spanish origin What is your race?: A. White Do you need or want an  interpreter to communicate with a doctor or health care staff?: 0. No  Patient's Response To:  Health Literacy and Transportation Is the patient able to respond to health literacy and transportation needs?: Yes Health Literacy - How often do you need to have someone help you when you read instructions, pamphlets, or other written material from your doctor or pharmacy?: Never In the past 12 months, has lack of transportation kept you from medical appointments or from getting medications?: No In the past 12 months, has lack of transportation kept you from meetings, work, or from getting things needed for daily living?: No  Home Assistive Devices / [place] Devices/Equipment: Radio producer (specify quad or straight) Home Equipment: Conservation officer, nature (2 wheels), BSC/3in1, Shower seat, Wheelchair - manual  Prior Device Use: Indicate devices/aids used by the patient prior to current illness, exacerbation or injury? None of the above  Current Functional Level Cognition  Overall Cognitive Status: Within Functional Limits for tasks assessed Orientation Level: Oriented X4 General Comments: following commands well    Extremity Assessment (includes Sensation/Coordination)  Upper Extremity Assessment: Overall WFL for tasks assessed  Lower Extremity Assessment: Defer to PT evaluation    ADLs  Overall ADL's : Needs assistance/impaired Eating/Feeding: Set up Grooming: Wash/dry hands, Wash/dry face, Oral care, Min guard, Standing Grooming Details (indicate cue type and reason): at sink Upper Body Bathing: Minimal assistance Lower Body Bathing: Maximal assistance Upper Body Dressing : Set up, Sitting Upper Body Dressing Details (indicate cue type and reason): change gown Lower Body Dressing: Maximal assistance Lower Body Dressing Details (indicate cue type and reason): to donn socks Toilet Transfer: Minimal assistance, Ambulation, Regular Toilet, Rolling walker (2 wheels) Toilet Transfer  Details (indicate cue type and reason): ambulated to bathroom and used grab bars to assist with lowering to toilet Toileting- Clothing Manipulation and Hygiene: Total assistance Toileting - Clothing Manipulation Details (indicate cue type and reason): not able to void at this time. Functional mobility during ADLs: Minimal assistance General ADL Comments: improvement with mobility with self care    Mobility  Overal bed mobility: Needs Assistance Bed Mobility: Sit to Sidelying, Rolling Rolling: Mod  assist Sidelying to sit: Mod assist Sit to supine: Max assist Sit to sidelying: Mod assist General bed mobility comments: Multimodal cues for technique; light mod assist to help LEs into bed    Transfers  Overall transfer level: Needs assistance Equipment used: Rolling walker (2 wheels) Transfers: Sit to/from Stand Sit to Stand: Min assist, Mod assist Bed to/from chair/wheelchair/BSC transfer type:: Stand pivot, Step pivot Stand pivot transfers: Mod assist, Max assist Step pivot transfers: Min assist General transfer comment: Min assist to stand from recliner with slow rise; close guard to help control descent to sit to commode/EOB; Light mod assist to rise from low toilet seat; good hand placement    Ambulation / Gait / Stairs / Wheelchair Mobility  Ambulation/Gait Ambulation/Gait assistance: Min guard (with physical contact) Gait Distance (Feet): 20 Feet (x2 to/from the bathroom) Assistive device: Rolling walker (2 wheels) Gait Pattern/deviations: Decreased step length - right, Decreased step length - left General Gait Details: Painful throughout gait cycle R and LLEs, multiple stops and standing rest breaks due to spasm-like pain Gait velocity: decreased    Posture / Balance Dynamic Sitting Balance Sitting balance - Comments: LOB Balance Overall balance assessment: Needs assistance Sitting-balance support: Feet supported, Single extremity supported Sitting balance-Leahy Scale:  Fair Sitting balance - Comments: LOB Postural control: Posterior lean Standing balance support: Single extremity supported, Bilateral upper extremity supported, During functional activity Standing balance-Leahy Scale: Poor Standing balance comment: able to stand with single extremity support while performing grooming tasks    Special needs/care consideration CPAP and Skin surgical incision   Previous Home Environment (from acute therapy documentation) Living Arrangements: Spouse/significant other, Children, Other relatives Available Help at Discharge: Family, Available PRN/intermittently Type of Home: House Home Layout: One level Home Access: Level entry Bathroom Shower/Tub: Multimedia programmer: Handicapped height Bathroom Accessibility: Yes Home Care Services: No Additional Comments: spouse reports 15 people living in house including grandchildren and great grandchildren. retired Chiropractor for Discharge Living Setting: Patient's home, Lives with (comment) ([place] family) Type of Home at Discharge: House Discharge Home Layout: One level Discharge Home Access: Level entry Discharge Bathroom Shower/Tub: Walk-in shower Discharge Bathroom Toilet: Handicapped height Discharge Bathroom Accessibility: Yes How Accessible: Accessible via wheelchair Does the patient have any problems obtaining your medications?: No  Social/Family/Support Systems Patient Roles: Spouse Contact Information: [name] Anticipated Caregiver: Eddie Dibbles Anticipated Caregiver's Contact Information: [phone] Ability/Limitations of Caregiver: up to min assist for ADLs Caregiver Availability: 24/7 Discharge Plan Discussed with Primary Caregiver: Yes Is Caregiver In Agreement with Plan?: Yes Does Caregiver/Family have Issues with Lodging/Transportation while Pt is in Rehab?: No  Goals Patient/Family Goal for Rehab: PT/OT supervision to mod I, SLP n/a Expected length of  stay: 12-14 days Additional Information: Plan to d/c home with spouse, Eddie Dibbles, as primary caregiver.  Pt also has 56 other family members in the home (varying ages including grown children and in-laws). Pt/Family Agrees to Admission and willing to participate: Yes Program Orientation Provided & Reviewed with Pt/Caregiver Including Roles  & Responsibilities: Yes  Decrease burden of Care through IP rehab admission: n/a  Possible need for SNF placement upon discharge: not anticipated.  Plan to d/c to pt's home with spouse as primary caregiver.  He confirms he can provide 24/7 assist if needed.   Patient Condition: I have reviewed medical records from [facility], spoken with CM, and patient and spouse. I met with patient at the bedside and discussed via phone for inpatient rehabilitation assessment.  Patient  will benefit from ongoing PT and OT, can actively participate in 3 hours of therapy a day 5 days of the week, and can make measurable gains during the admission.  Patient will also benefit from the coordinated team approach during an Inpatient Acute Rehabilitation admission.  The patient will receive intensive therapy as well as Rehabilitation physician, nursing, social worker, and care management interventions.  Due to safety, skin/wound care, disease management, medication administration, pain management, and patient education the patient requires 24 hour a day rehabilitation nursing.  The patient is currently min to mod assist with mobility and up to max assist with basic ADLs.  Discharge setting and therapy post discharge at home with home health is anticipated.  Patient has agreed to participate in the Acute Inpatient Rehabilitation Program and will admit today.  Preadmission Screen Completed By:  Michel Santee, PT, DPT 03/20/2022 10:00 AM ______________________________________________________________________   Discussed status with Dr. Ranell Patrick on 03/20/22  at 10:00 AM  and received approval for  admission today.  Admission Coordinator:  Michel Santee, PT, DPT time 10:00 AM Sudie Grumbling 03/20/22    Assessment/Plan: Diagnosis: Lumbar spondylolisthesis Does the need for close, 24 hr/day Medical supervision in concert with the patient's rehab needs make it unreasonable for this patient to be served in a less intensive setting? Yes Co-Morbidities requiring supervision/potential complications: overweight, type 2 diabetes, CAD, mild CKD, HTN, HLD Due to bladder management, bowel management, safety, skin/wound care, disease management, medication administration, pain management, and patient education, does the patient require 24 hr/day rehab nursing? Yes Does the patient require coordinated care of a physician, rehab nurse, PT, OT, and SLP to address physical and functional deficits in the context of the above medical diagnosis(es)? Yes Addressing deficits in the following areas: balance, endurance, locomotion, strength, transferring, bowel/bladder control, bathing, dressing, feeding, grooming, toileting, and psychosocial support Can the patient actively participate in an intensive therapy program of at least 3 hrs of therapy 5 days a week? Yes The potential for patient to make measurable gains while on inpatient rehab is excellent Anticipated functional outcomes upon discharge from inpatient rehab: modified independent PT, modified independent OT, modified independent SLP Estimated rehab length of stay to reach the above functional goals is: 7-10 days Anticipated discharge destination: Home 10. Overall Rehab/Functional Prognosis: excellent   MD Signature: Leeroy Cha, MD

## 2022-03-18 NOTE — Progress Notes (Signed)
Inpatient Rehab Coordinator Note:  I met with patient at bedside to discuss CIR recommendations and goals/expectations of CIR stay.  We reviewed 3 hrs/day of therapy, physician follow up, and average length of stay 2 weeks (dependent upon progress) with goals of supervision to mod i.  Pt reports she lives at home with her spouse and 38 other family members.  The home is w/c accessible.  I spoke to spouse over the phone Eddie Dibbles) and he confirms that pt will have 24/7 assist from himself or another family member if needed.  He is comfortable providing up to min assist for ADLs.  I reviewed medicare benefits with both pt and spouse.  Will follow for potential admit pending bed availability.    Shann Medal, PT, DPT Admissions Coordinator [phone] 03/18/22  3:18 PM

## 2022-03-18 NOTE — Plan of Care (Signed)
  Problem: Education: Goal: Knowledge of General Education information will improve Description: Including pain rating scale, medication(s)/side effects and non-pharmacologic comfort measures Outcome: Progressing   Problem: Health Behavior/Discharge Planning: Goal: Ability to manage health-related needs will improve Outcome: Progressing   Problem: Clinical Measurements: Goal: Ability to maintain clinical measurements within normal limits will improve Outcome: Progressing Goal: Will remain free from infection Outcome: Progressing Goal: Diagnostic test results will improve Outcome: Progressing Goal: Respiratory complications will improve Outcome: Progressing Goal: Cardiovascular complication will be avoided Outcome: Progressing   Problem: Activity: Goal: Risk for activity intolerance will decrease Outcome: Progressing   Problem: Nutrition: Goal: Adequate nutrition will be maintained Outcome: Progressing   Problem: Coping: Goal: Level of anxiety will decrease Outcome: Progressing   Problem: Elimination: Goal: Will not experience complications related to bowel motility Outcome: Progressing Goal: Will not experience complications related to urinary retention Outcome: Progressing   Problem: Pain Managment: Goal: General experience of comfort will improve Outcome: Progressing   Problem: Safety: Goal: Ability to remain free from injury will improve Outcome: Progressing   Problem: Skin Integrity: Goal: Risk for impaired skin integrity will decrease Outcome: Progressing   Problem: Education: Goal: Ability to verbalize activity precautions or restrictions will improve Outcome: Progressing Goal: Knowledge of the prescribed therapeutic regimen will improve Outcome: Progressing Goal: Understanding of discharge needs will improve Outcome: Progressing   Problem: Activity: Goal: Ability to avoid complications of mobility impairment will improve Outcome: Progressing Goal:  Ability to tolerate increased activity will improve Outcome: Progressing Goal: Will remain free from falls Outcome: Progressing   Problem: Bowel/Gastric: Goal: Gastrointestinal status for postoperative course will improve Outcome: Progressing   Problem: Clinical Measurements: Goal: Ability to maintain clinical measurements within normal limits will improve Outcome: Progressing Goal: Postoperative complications will be avoided or minimized Outcome: Progressing Goal: Diagnostic test results will improve Outcome: Progressing   Problem: Pain Management: Goal: Pain level will decrease Outcome: Progressing   Problem: Skin Integrity: Goal: Will show signs of wound healing Outcome: Progressing   Problem: Health Behavior/Discharge Planning: Goal: Identification of resources available to assist in meeting health care needs will improve Outcome: Progressing   Problem: Bladder/Genitourinary: Goal: Urinary functional status for postoperative course will improve Outcome: Progressing   Problem: Education: Goal: Ability to describe self-care measures that may prevent or decrease complications (Diabetes Survival Skills Education) will improve Outcome: Progressing Goal: Individualized Educational Video(s) Outcome: Progressing   Problem: Coping: Goal: Ability to adjust to condition or change in health will improve Outcome: Progressing   Problem: Fluid Volume: Goal: Ability to maintain a balanced intake and output will improve Outcome: Progressing   Problem: Health Behavior/Discharge Planning: Goal: Ability to identify and utilize available resources and services will improve Outcome: Progressing Goal: Ability to manage health-related needs will improve Outcome: Progressing   Problem: Metabolic: Goal: Ability to maintain appropriate glucose levels will improve Outcome: Progressing   Problem: Nutritional: Goal: Maintenance of adequate nutrition will improve Outcome:  Progressing Goal: Progress toward achieving an optimal weight will improve Outcome: Progressing   Problem: Skin Integrity: Goal: Risk for impaired skin integrity will decrease Outcome: Progressing   Problem: Tissue Perfusion: Goal: Adequacy of tissue perfusion will improve Outcome: Progressing

## 2022-03-19 LAB — GLUCOSE, CAPILLARY
Glucose-Capillary: 151 mg/dL — ABNORMAL HIGH (ref 70–99)
Glucose-Capillary: 162 mg/dL — ABNORMAL HIGH (ref 70–99)
Glucose-Capillary: 169 mg/dL — ABNORMAL HIGH (ref 70–99)
Glucose-Capillary: 136 mg/dL — ABNORMAL HIGH (ref 70–99)

## 2022-03-19 MED ORDER — HEPARIN SODIUM (PORCINE) 5000 UNIT/ML IJ SOLN
5000.0000 [IU] | Freq: Three times a day (TID) | INTRAMUSCULAR | Status: DC
  Administered 2022-03-19 – 2022-03-20 (×2): 5000 [IU] via SUBCUTANEOUS
  Filled 2022-03-19 (×2): qty 1

## 2022-03-19 MED ORDER — GLYCERIN (LAXATIVE) 2 G RE SUPP
1.0000 | Freq: Every day | RECTAL | Status: DC | PRN
  Administered 2022-03-20: 1 via RECTAL
  Filled 2022-03-19 (×2): qty 1

## 2022-03-19 NOTE — Progress Notes (Signed)
Physical Therapy Note  Worked with pt today while her husband was not present;  Pt voiced concern that he can be so focused on helping her that he has trouble listening to her needs;  She reports it can lead to frustration and arguments;  She confirmed with me that she does feel safe in her home -- she just brought it up because she wants to minimize conflict;  She does not want her husband to know about this conversation for concern about him feeling bad about himself and his ability to assist her;  We briefly discussed trying to help her husband shift his perception of his job in her care from "helping her" to "listening to her";  Roney Marion, [place] [facility] [phone]

## 2022-03-19 NOTE — H&P (Incomplete)
Physical Medicine and Rehabilitation Admission H&P     HPI: Tammy Pineda is a 77 year old right-handed female with history of type 2 diabetes mellitus, nonobstructive CAD, obesity with BMI 29.29, mild CKD with latest creatinine 0.93, hypertension, hyperlipidemia, glaucoma, left TKA 2021.  Per chart review patient lives with spouse and multiple family members.  1 level home.  Independent and driving prior to admission.  She is a retired Therapist, sports.  Presented 03/14/2022 with chronic right lower extremity radiculopathy and low back pain.  Admission chemistries unremarkable with hemoglobin 11.6, potassium 3.4, glucose 257, creatinine 0.93, surgical PCR/MRSA positive.  MRI revealed severe degenerative spondylosis at L3-4 L4-5 with severe stenosis, lateral recess and foraminal degenerative disc disease with facet arthropathy.  She has failed multiple conservative measures.  Underwent L3-5 decompression and transforaminal lumbar body fusion 03/14/2022 per Dr. Pieter Partridge Dawley.  Back brace when out of bed.  May remove to shower..  Patient was cleared to begin subcutaneous heparin for DVT prophylaxis 03/19/2022.  Patient is completing course of Bactroban for MRSA PCR screening positive.  Therapy evaluations completed due to patient's decreased functional mobility was admitted for a comprehensive rehab program.  Review of Systems  Constitutional:  Negative for chills and fever.  HENT:  Negative for hearing loss.   Eyes:  Negative for blurred vision and double vision.  Respiratory:  Negative for cough, shortness of breath and wheezing.   Cardiovascular:  Negative for chest pain, palpitations and leg swelling.  Gastrointestinal:  Positive for constipation. Negative for heartburn, nausea and vomiting.       GERD  Genitourinary:  Negative for dysuria, flank pain and hematuria.  Musculoskeletal:  Positive for back pain.  Skin:  Negative for rash.  Neurological:  Positive for sensory change and weakness.   Psychiatric/Behavioral:  Positive for depression.        Anxiety  All other systems reviewed and are negative.  Past Medical History:  Diagnosis Date   Anxiety    Arthritis    Chronic kidney disease    Coronary artery disease    nonobstructive by cardiac catheterization 2011 with a 30% LAD lesion   Depression    Diabetes ([place])    Type 2   GERD (gastroesophageal reflux disease)    Glaucoma    Hypercholesteremia    Hypertension    NASH (nonalcoholic steatohepatitis)    NASH   Pneumonia    30 years ago   PVC (premature ventricular contraction)    Seasonal allergies    Sleep apnea    Spinal stenosis    Past Surgical History:  Procedure Laterality Date   ANTERIOR AND POSTERIOR VAGINAL REPAIR     APPENDECTOMY  1959   BLADDER SUSPENSION     BUNIONECTOMY     CARDIAC CATHETERIZATION  2011   clean cath   CARDIOVASCULAR STRESS TEST  2022   CATARACT EXTRACTION Left 2015   CATARACT EXTRACTION W/ INTRAOCULAR LENS IMPLANT Right 2010   KNEE ARTHROSCOPY Left    NASAL RECONSTRUCTION  1976   ROBOTIC ASSISTED LAPAROSCOPIC SACROCOLPOPEXY     with mesh, urethral sling, posterior vaginal repair   SHOULDER ARTHROSCOPY Left    SHOULDER SURGERY Right 10/16/2021   repair   TOTAL ABDOMINAL HYSTERECTOMY  1976   TOTAL KNEE ARTHROPLASTY Left 01/03/2020   Procedure: TOTAL KNEE ARTHROPLASTY;  Surgeon: Gaynelle Arabian, MD;  Location: [facility];  Service: Orthopedics;  Laterality: Left;  1mn   TRANSFORAMINAL LUMBAR INTERBODY FUSION W/ MIS 2 LEVEL Right 03/14/2022  Procedure: Minimally Invasive Surgery Decompression, Transforaminal Lumbar Interbody Fusion Lumbar three-four, Lumbar four-five;  Surgeon: Dawley, Theodoro Doing, DO;  Location: [facility];  Service: Neurosurgery;  Laterality: Right;   Family History  Problem Relation Age of Onset   Heart disease Father        open heart surgery at 47   Colon cancer Father    Heart disease Mother        open heart surgery at age 19   Hypertension Mother    Heart  attack Maternal Grandfather    Stroke Paternal Grandmother    Heart block Brother    Social History:  reports that she has never smoked. She has never used smokeless tobacco. She reports that she does not drink alcohol and does not use drugs. Allergies:  Allergies  Allergen Reactions   Metformin And Related Diarrhea   Gabapentin Palpitations   Hydrocodone Itching   Plaquenil [Hydroxychloroquine] Nausea And Vomiting   Tylenol [Acetaminophen] Other (See Comments)    History of Liver Disease   Sulfamethoxazole-Trimethoprim Rash   Tape Rash   Medications Prior to Admission  Medication Sig Dispense Refill   ALPRAZolam (XANAX) 0.5 MG tablet Take 0.25 mg by mouth at bedtime as needed for anxiety.     brimonidine (ALPHAGAN) 0.2 % ophthalmic solution Place 1 drop into both eyes 2 (two) times daily.     Cholecalciferol (VITAMIN D) 125 MCG (5000 UT) CAPS Take 5,000 Units by mouth daily.     Cyanocobalamin (VITAMIN B-12) 5000 MCG TBDP Take 1,000 mcg by mouth See admin instructions. Takes on Monday and Thursday     dicyclomine (BENTYL) 10 MG capsule Take 10 mg by mouth 3 (three) times daily as needed for spasms.     escitalopram (LEXAPRO) 10 MG tablet Take 10 mg by mouth at bedtime.      glipiZIDE (GLUCOTROL XL) 2.5 MG 24 hr tablet TAKE 1 TABLET BY MOUTH 2x DAILY BEFORE BREAKFAST (Patient taking differently: Take 2.5 mg by mouth 2 (two) times daily with breakfast and lunch.) 180 tablet 3   glucose blood (ONETOUCH VERIO) test strip Use as instructed to check blood sugar 1X daily 300 each 3   hydrochlorothiazide (MICROZIDE) 12.5 MG capsule Take 12.5 mg by mouth daily.     HYDROcodone-acetaminophen (NORCO/VICODIN) 5-325 MG tablet Take 1 tablet by mouth every 6 (six) hours as needed for moderate pain.     latanoprost (XALATAN) 0.005 % ophthalmic solution Place 1 drop into both eyes at bedtime.     losartan (COZAAR) 50 MG tablet Take 50 mg by mouth at bedtime.     methocarbamol (ROBAXIN) 500 MG tablet  Take 1 tablet (500 mg total) by mouth every 6 (six) hours as needed for muscle spasms. 40 tablet 0   omeprazole (PRILOSEC) 20 MG capsule Take 20 mg by mouth daily as needed (acid reflux/indigestion).     rosuvastatin (CRESTOR) 5 MG tablet Take 5 mg by mouth at bedtime.      timolol (TIMOPTIC) 0.5 % ophthalmic solution Place 1 drop into both eyes daily.     ASPIRIN 81 PO Take 81 mg by mouth daily. (Patient not taking: Reported on 03/05/2022)     Continuous Blood Gluc Sensor (DEXCOM G7 SENSOR) MISC 3 each by Does not apply route every 30 (thirty) days. Apply 1 sensor every 10 days 9 each 4   meclizine (ANTIVERT) 12.5 MG tablet Take 1 tablet (12.5 mg total) by mouth 3 (three) times daily as needed for dizziness. 21 tablet  0      Home: Home Living Family/patient expects to be discharged to:: Private residence Living Arrangements: Spouse/significant other, Children, Other relatives Available Help at Discharge: Family, Available PRN/intermittently Type of Home: House Home Access: Level entry Home Layout: One level Bathroom Shower/Tub: Multimedia programmer: Handicapped height Bathroom Accessibility: Yes Home Equipment: Conservation officer, nature (2 wheels), BSC/3in1, Civil engineer, contracting, Wheelchair - manual Additional Comments: spouse reports 15 people living in house including grandchildren and great grandchildren. retired Retail banker History: Prior Function Prior Level of Function : Independent/Modified Independent Mobility Comments: Independent with mobility, drives, loves to line dance ADLs Comments: independent  Functional Status:  Mobility: Bed Mobility Overal bed mobility: Needs Assistance Bed Mobility: Sidelying to Sit Rolling: Mod assist Sidelying to sit: Mod assist Sit to supine: Max assist Sit to sidelying: Max assist General bed mobility comments: Multimodal cues for technique, and physical assist to elevate trunk Transfers Overall transfer level: Needs assistance Equipment  used: Rolling walker (2 wheels) Transfers: Sit to/from Stand Sit to Stand: Mod assist Bed to/from chair/wheelchair/BSC transfer type:: Stand pivot, Step pivot Stand pivot transfers: Mod assist, Max assist Step pivot transfers: Min assist General transfer comment: Light mod assist to power up with cues for hand placement Ambulation/Gait Ambulation/Gait assistance: Min assist Gait Distance (Feet): 35 Feet (x2) Assistive device: Rolling walker (2 wheels) Gait Pattern/deviations: Decreased step length - right, Decreased step length - left General Gait Details: Pt feeling shaky, but motivated to ambulate more; painful throughout gait cycle R and LLEs, multiple stops and standing rest breaks due to spasm Gait velocity: decreased    ADL: ADL Overall ADL's : Needs assistance/impaired Eating/Feeding: Set up Grooming: Wash/dry hands, Wash/dry face, Oral care, Min guard, Standing Grooming Details (indicate cue type and reason): at sink Upper Body Bathing: Minimal assistance Lower Body Bathing: Maximal assistance Upper Body Dressing : Set up, Sitting Upper Body Dressing Details (indicate cue type and reason): change gown Lower Body Dressing: Maximal assistance Lower Body Dressing Details (indicate cue type and reason): to donn socks Toilet Transfer: Minimal assistance, Ambulation, Regular Toilet, Rolling walker (2 wheels) Toilet Transfer Details (indicate cue type and reason): ambulated to bathroom and used grab bars to assist with lowering to toilet Toileting- Clothing Manipulation and Hygiene: Total assistance Toileting - Clothing Manipulation Details (indicate cue type and reason): not able to void at this time. Functional mobility during ADLs: Minimal assistance General ADL Comments: improvement with mobility with self care  Cognition: Cognition Overall Cognitive Status: Within Functional Limits for tasks assessed Orientation Level: Oriented X4 Cognition Arousal/Alertness:  Awake/alert Behavior During Therapy: WFL for tasks assessed/performed Overall Cognitive Status: Within Functional Limits for tasks assessed General Comments: following commands well  Physical Exam: Blood pressure (!) 118/48, pulse 70, temperature 98.2 F (36.8 C), temperature source Oral, resp. rate 16, height 5' 5"$  (1.651 m), weight 79.8 kg, SpO2 91 %. Physical Exam Skin:    Comments: Back incision is dressed.  Neurological:     Comments: Patient is alert and oriented x 3.  Follows commands.     Results for orders placed or performed during the hospital encounter of 03/14/22 (from the past 48 hour(s))  Glucose, capillary     Status: Abnormal   Collection Time: 03/17/22  7:55 AM  Result Value Ref Range   Glucose-Capillary 125 (H) 70 - 99 mg/dL    Comment: Glucose reference range applies only to samples taken after fasting for at least 8 hours.  Glucose, capillary     Status:  Abnormal   Collection Time: 03/17/22 12:15 PM  Result Value Ref Range   Glucose-Capillary 184 (H) 70 - 99 mg/dL    Comment: Glucose reference range applies only to samples taken after fasting for at least 8 hours.  Glucose, capillary     Status: None   Collection Time: 03/17/22  4:51 PM  Result Value Ref Range   Glucose-Capillary 92 70 - 99 mg/dL    Comment: Glucose reference range applies only to samples taken after fasting for at least 8 hours.  Glucose, capillary     Status: None   Collection Time: 03/17/22  9:42 PM  Result Value Ref Range   Glucose-Capillary 79 70 - 99 mg/dL    Comment: Glucose reference range applies only to samples taken after fasting for at least 8 hours.  Glucose, capillary     Status: Abnormal   Collection Time: 03/18/22 12:15 AM  Result Value Ref Range   Glucose-Capillary 107 (H) 70 - 99 mg/dL    Comment: Glucose reference range applies only to samples taken after fasting for at least 8 hours.  Glucose, capillary     Status: Abnormal   Collection Time: 03/18/22  7:31 AM   Result Value Ref Range   Glucose-Capillary 123 (H) 70 - 99 mg/dL    Comment: Glucose reference range applies only to samples taken after fasting for at least 8 hours.  Glucose, capillary     Status: Abnormal   Collection Time: 03/18/22 10:35 AM  Result Value Ref Range   Glucose-Capillary 148 (H) 70 - 99 mg/dL    Comment: Glucose reference range applies only to samples taken after fasting for at least 8 hours.  Glucose, capillary     Status: Abnormal   Collection Time: 03/18/22  5:43 PM  Result Value Ref Range   Glucose-Capillary 137 (H) 70 - 99 mg/dL    Comment: Glucose reference range applies only to samples taken after fasting for at least 8 hours.  Glucose, capillary     Status: Abnormal   Collection Time: 03/18/22 10:10 PM  Result Value Ref Range   Glucose-Capillary 129 (H) 70 - 99 mg/dL    Comment: Glucose reference range applies only to samples taken after fasting for at least 8 hours.  Glucose, capillary     Status: Abnormal   Collection Time: 03/19/22  7:11 AM  Result Value Ref Range   Glucose-Capillary 151 (H) 70 - 99 mg/dL    Comment: Glucose reference range applies only to samples taken after fasting for at least 8 hours.   No results found.    Blood pressure (!) 118/48, pulse 70, temperature 98.2 F (36.8 C), temperature source Oral, resp. rate 16, height 5' 5"$  (1.651 m), weight 79.8 kg, SpO2 91 %.  Medical Problem List and Plan: 1. Functional deficits secondary to lumbar stenosis/radiculopathy.  Status post L3-5 decompression and transforaminal lumbar body fusion 03/14/2022.  Back brace when out of bed.  May remove to shower.  -patient may *** shower  -ELOS/Goals: *** 2.  Antithrombotics: -DVT/anticoagulation: Subcutaneous heparin.  Check vascular study  -antiplatelet therapy: N/A 3. Pain Management: Robaxin 500 mg every 6 hours as needed muscle spasms, oxycodone/tramadol as needed 4. Mood/Behavior/Sleep: Lexapro 10 mg nightly, Xanax 0.25 mg bedtime as  needed  -antipsychotic agents: N/A 5. Neuropsych/cognition: This patient is capable of making decisions on her own behalf. 6. Skin/Wound Care: Routine skin checks 7. Fluids/Electrolytes/Nutrition: Routine in and outs with follow-up chemistries 8.  MRSA PCR screening positive.  Completed  course of Bactroban 9.  Diabetes mellitus.  Glucotrol 2.5 mg twice daily. 10.  Hypertension.  HCTZ 12.5 mg daily, Cozaar 50 mg nightly, monitor with increased mobility 11.  GERD.  Protonix 12.  Hyperlipidemia.  Crestor 13.  History of CAD.  Nonobstructive by cardiac catheterization 2011.  Will discuss with neurosurgery when to resume low-dose aspirin 14.  Constipation.  Colace 100 mg twice daily, Dulcolax tablet daily as needed 15.  OSA.  CPAP Cathlyn Parsons, PA-C 03/19/2022

## 2022-03-19 NOTE — Progress Notes (Signed)
Pt able to place CPAP on self. Will call if assistance is needed. ?

## 2022-03-19 NOTE — Progress Notes (Signed)
Physical Therapy Treatment Patient Details Name: Tammy Pineda MRN: [id number] DOB: [birth year] Today's Date: 03/19/2022   History of Present Illness 77 y/o female who presents on 2/8 for L 3-4, 4-5 TLIF. PMH includes HTN, glaucoma, CKD, CAD, depression, anxiety, DM.    PT Comments    Continuing work on functional mobility and activity tolerance;  Session focused on bed mobility, functional transfers, with noted progress in sti<>stand from varying surfaces; Able to use RW well for stability with functional tasks and ambulation; plan for post-acute rehab, likely to AIR tomorrow   Recommendations for follow up therapy are one component of a multi-disciplinary discharge planning process, led by the attending physician.  Recommendations may be updated based on patient status, additional functional criteria and insurance authorization.  Follow Up Recommendations  Acute inpatient rehab (3hours/day)     Assistance Recommended at Discharge Frequent or constant Supervision/Assistance  Patient can return home with the following A lot of help with walking and/or transfers;A little help with bathing/dressing/bathroom;Assist for transportation;Assistance with cooking/housework   Equipment Recommendations  None recommended by PT    Recommendations for Other Services       Precautions / Restrictions Precautions Precautions: Back Precaution Comments: reviewed precautions Restrictions Weight Bearing Restrictions: No     Mobility  Bed Mobility Overal bed mobility: Needs Assistance Bed Mobility: Sit to Sidelying, Rolling Rolling: Mod assist       Sit to sidelying: Mod assist General bed mobility comments: Multimodal cues for technique; light mod assist to help LEs into bed    Transfers Overall transfer level: Needs assistance Equipment used: Rolling walker (2 wheels) Transfers: Sit to/from Stand Sit to Stand: Min assist, Mod assist           General transfer comment: Min assist  to stand from recliner with slow rise; close guard to help control descent to sit to commode/EOB; Light mod assist to rise from low toilet seat; good hand placement    Ambulation/Gait Ambulation/Gait assistance: Min guard (with physical contact) Gait Distance (Feet): 20 Feet (x2 to/from the bathroom) Assistive device: Rolling walker (2 wheels) Gait Pattern/deviations: Decreased step length - right, Decreased step length - left       General Gait Details: Painful throughout gait cycle R and LLEs, multiple stops and standing rest breaks due to spasm-like pain   Stairs             Wheelchair Mobility    Modified Rankin (Stroke Patients Only)       Balance     Sitting balance-Leahy Scale: Fair       Standing balance-Leahy Scale: Poor                              Cognition Arousal/Alertness: Awake/alert Behavior During Therapy: WFL for tasks assessed/performed Overall Cognitive Status: Within Functional Limits for tasks assessed                                          Exercises      General Comments General comments (skin integrity, edema, etc.): NAD on RA      Pertinent Vitals/Pain Pain Assessment Pain Assessment: 0-10 Pain Score: 7  Pain Location: back, BLE's Pain Descriptors / Indicators: Sore, Operative site guarding, Grimacing, Guarding, Discomfort, Spasm Pain Intervention(s): Monitored during session, Repositioned    Home Living  Prior Function            PT Goals (current goals can now be found in the care plan section) Acute Rehab PT Goals Patient Stated Goal: to get back to line dancing; hopes to get to AIR soon PT Goal Formulation: With patient/family Time For Goal Achievement: 03/29/22 Potential to Achieve Goals: Fair Progress towards PT goals: Progressing toward goals    Frequency    Min 5X/week      PT Plan Current plan remains appropriate    Co-evaluation               AM-PAC PT "6 Clicks" Mobility   Outcome Measure  Help needed turning from your back to your side while in a flat bed without using bedrails?: A Little Help needed moving from lying on your back to sitting on the side of a flat bed without using bedrails?: A Lot Help needed moving to and from a bed to a chair (including a wheelchair)?: A Lot Help needed standing up from a chair using your arms (e.g., wheelchair or bedside chair)?: A Lot Help needed to walk in hospital room?: A Little Help needed climbing 3-5 steps with a railing? : Total 6 Click Score: 13    End of Session Equipment Utilized During Treatment: Back brace Activity Tolerance: Patient tolerated treatment well Patient left: in bed;with call bell/phone within reach Nurse Communication: Mobility status PT Visit Diagnosis: Pain;Muscle weakness (generalized) (M62.81);Difficulty in walking, not elsewhere classified (R26.2) Pain - part of body:  (back and bilateral LEs)     Time: [id number] PT Time Calculation (min) (ACUTE ONLY): 33 min  Charges:  $Gait Training: 8-22 mins $Therapeutic Activity: 8-22 mins                     Roney Marion, [facility] [place] 03/19/2022, 11:36 AM

## 2022-03-19 NOTE — Progress Notes (Signed)
Inpatient Rehab Admissions Coordinator:   I do not have a bed for this patient to admit at this time.  Will follow for possible admission tomorrow pending bed availability.   Shann Medal, PT, DPT Admissions Coordinator [phone] 03/19/22  11:24 AM

## 2022-03-19 NOTE — Progress Notes (Signed)
CPAP settings were adjusted per patient's request. Patient stated that the setting was "too strong" at 12cm H2O. CPAP was adjusted to auto titrate mode with a minimum pressure of 5 & a maximum pressure of 10.

## 2022-03-19 NOTE — Progress Notes (Signed)
   Providing Compassionate, Quality Care - Together  NEUROSURGERY PROGRESS NOTE   S: No issues overnight. Pain more controlled  O: EXAM:  BP (!) 130/39 (BP Location: Right Arm)   Pulse 70   Temp 98.9 F (37.2 C) (Oral)   Resp 16   Ht '5\' 5"'$  (1.651 m)   Wt 79.8 kg   SpO2 93%   BMI 29.29 kg/m  Awake, alert, oriented  PERRL Speech fluent, appropriate  CNs grossly intact  5/5 BUE/BLE    ASSESSMENT:  77 y.o. female with    L3-5 stenosis, spondylosis    S/p L3-5 TLIF   PLAN: -Rehab tomorrow -sqh -suppository      Thank you for allowing me to participate in this patient's care.  Please do not hesitate to call with questions or concerns.   Elwin Sleight, [facility] Cell: [phone]

## 2022-03-19 NOTE — Plan of Care (Signed)
  Problem: Education: Goal: Knowledge of General Education information will improve Description: Including pain rating scale, medication(s)/side effects and non-pharmacologic comfort measures Outcome: Progressing   Problem: Health Behavior/Discharge Planning: Goal: Ability to manage health-related needs will improve Outcome: Progressing   Problem: Clinical Measurements: Goal: Ability to maintain clinical measurements within normal limits will improve Outcome: Progressing Goal: Will remain free from infection Outcome: Progressing Goal: Diagnostic test results will improve Outcome: Progressing Goal: Respiratory complications will improve Outcome: Progressing Goal: Cardiovascular complication will be avoided Outcome: Progressing   Problem: Activity: Goal: Risk for activity intolerance will decrease Outcome: Progressing   Problem: Nutrition: Goal: Adequate nutrition will be maintained Outcome: Progressing   Problem: Coping: Goal: Level of anxiety will decrease Outcome: Progressing   Problem: Elimination: Goal: Will not experience complications related to bowel motility Outcome: Progressing Goal: Will not experience complications related to urinary retention Outcome: Progressing   Problem: Pain Managment: Goal: General experience of comfort will improve Outcome: Progressing   Problem: Safety: Goal: Ability to remain free from injury will improve Outcome: Progressing   Problem: Skin Integrity: Goal: Risk for impaired skin integrity will decrease Outcome: Progressing   Problem: Education: Goal: Ability to verbalize activity precautions or restrictions will improve Outcome: Progressing Goal: Knowledge of the prescribed therapeutic regimen will improve Outcome: Progressing Goal: Understanding of discharge needs will improve Outcome: Progressing   Problem: Activity: Goal: Ability to avoid complications of mobility impairment will improve Outcome: Progressing Goal:  Ability to tolerate increased activity will improve Outcome: Progressing Goal: Will remain free from falls Outcome: Progressing   Problem: Bowel/Gastric: Goal: Gastrointestinal status for postoperative course will improve Outcome: Progressing   Problem: Clinical Measurements: Goal: Ability to maintain clinical measurements within normal limits will improve Outcome: Progressing Goal: Postoperative complications will be avoided or minimized Outcome: Progressing Goal: Diagnostic test results will improve Outcome: Progressing   Problem: Pain Management: Goal: Pain level will decrease Outcome: Progressing   Problem: Skin Integrity: Goal: Will show signs of wound healing Outcome: Progressing   Problem: Health Behavior/Discharge Planning: Goal: Identification of resources available to assist in meeting health care needs will improve Outcome: Progressing   Problem: Bladder/Genitourinary: Goal: Urinary functional status for postoperative course will improve Outcome: Progressing   Problem: Education: Goal: Ability to describe self-care measures that may prevent or decrease complications (Diabetes Survival Skills Education) will improve Outcome: Progressing Goal: Individualized Educational Video(s) Outcome: Progressing   Problem: Coping: Goal: Ability to adjust to condition or change in health will improve Outcome: Progressing   Problem: Fluid Volume: Goal: Ability to maintain a balanced intake and output will improve Outcome: Progressing   Problem: Health Behavior/Discharge Planning: Goal: Ability to identify and utilize available resources and services will improve Outcome: Progressing Goal: Ability to manage health-related needs will improve Outcome: Progressing   Problem: Metabolic: Goal: Ability to maintain appropriate glucose levels will improve Outcome: Progressing   Problem: Nutritional: Goal: Maintenance of adequate nutrition will improve Outcome:  Progressing Goal: Progress toward achieving an optimal weight will improve Outcome: Progressing   Problem: Skin Integrity: Goal: Risk for impaired skin integrity will decrease Outcome: Progressing   Problem: Tissue Perfusion: Goal: Adequacy of tissue perfusion will improve Outcome: Progressing

## 2022-03-20 ENCOUNTER — Encounter (HOSPITAL_COMMUNITY): Payer: Self-pay | Admitting: Physical Medicine and Rehabilitation

## 2022-03-20 ENCOUNTER — Inpatient Hospital Stay (HOSPITAL_COMMUNITY): Payer: Medicare Other

## 2022-03-20 ENCOUNTER — Inpatient Hospital Stay (HOSPITAL_COMMUNITY)
Admission: RE | Admit: 2022-03-20 | Discharge: 2022-03-29 | DRG: 560 | Disposition: A | Discharge status: Home / Self Care | Payer: Medicare Other | Source: Intra-hospital | Attending: Physical Medicine and Rehabilitation | Admitting: Physical Medicine and Rehabilitation

## 2022-03-20 ENCOUNTER — Other Ambulatory Visit: Payer: Self-pay

## 2022-03-20 DIAGNOSIS — E440 Moderate protein-calorie malnutrition: Secondary | ICD-10-CM | POA: Diagnosis present

## 2022-03-20 DIAGNOSIS — E1122 Type 2 diabetes mellitus with diabetic chronic kidney disease: Secondary | ICD-10-CM | POA: Diagnosis present

## 2022-03-20 DIAGNOSIS — K5900 Constipation, unspecified: Secondary | ICD-10-CM | POA: Diagnosis present

## 2022-03-20 DIAGNOSIS — I2510 Atherosclerotic heart disease of native coronary artery without angina pectoris: Secondary | ICD-10-CM | POA: Diagnosis present

## 2022-03-20 DIAGNOSIS — K7581 Nonalcoholic steatohepatitis (NASH): Secondary | ICD-10-CM | POA: Diagnosis present

## 2022-03-20 DIAGNOSIS — Z886 Allergy status to analgesic agent status: Secondary | ICD-10-CM

## 2022-03-20 DIAGNOSIS — Z9109 Other allergy status, other than to drugs and biological substances: Secondary | ICD-10-CM

## 2022-03-20 DIAGNOSIS — N390 Urinary tract infection, site not specified: Secondary | ICD-10-CM | POA: Diagnosis not present

## 2022-03-20 DIAGNOSIS — Z741 Need for assistance with personal care: Secondary | ICD-10-CM | POA: Diagnosis present

## 2022-03-20 DIAGNOSIS — Z888 Allergy status to other drugs, medicaments and biological substances status: Secondary | ICD-10-CM

## 2022-03-20 DIAGNOSIS — M25511 Pain in right shoulder: Secondary | ICD-10-CM | POA: Diagnosis not present

## 2022-03-20 DIAGNOSIS — G4733 Obstructive sleep apnea (adult) (pediatric): Secondary | ICD-10-CM | POA: Diagnosis present

## 2022-03-20 DIAGNOSIS — Z8249 Family history of ischemic heart disease and other diseases of the circulatory system: Secondary | ICD-10-CM | POA: Diagnosis not present

## 2022-03-20 DIAGNOSIS — E876 Hypokalemia: Secondary | ICD-10-CM | POA: Diagnosis present

## 2022-03-20 DIAGNOSIS — F419 Anxiety disorder, unspecified: Secondary | ICD-10-CM | POA: Diagnosis present

## 2022-03-20 DIAGNOSIS — B3731 Acute candidiasis of vulva and vagina: Secondary | ICD-10-CM | POA: Diagnosis not present

## 2022-03-20 DIAGNOSIS — Z79899 Other long term (current) drug therapy: Secondary | ICD-10-CM | POA: Diagnosis not present

## 2022-03-20 DIAGNOSIS — H409 Unspecified glaucoma: Secondary | ICD-10-CM | POA: Diagnosis present

## 2022-03-20 DIAGNOSIS — Z6829 Body mass index (BMI) 29.0-29.9, adult: Secondary | ICD-10-CM | POA: Diagnosis not present

## 2022-03-20 DIAGNOSIS — R5381 Other malaise: Secondary | ICD-10-CM | POA: Diagnosis present

## 2022-03-20 DIAGNOSIS — E669 Obesity, unspecified: Secondary | ICD-10-CM | POA: Diagnosis present

## 2022-03-20 DIAGNOSIS — Z9841 Cataract extraction status, right eye: Secondary | ICD-10-CM

## 2022-03-20 DIAGNOSIS — M7989 Other specified soft tissue disorders: Secondary | ICD-10-CM | POA: Diagnosis not present

## 2022-03-20 DIAGNOSIS — F32A Depression, unspecified: Secondary | ICD-10-CM | POA: Diagnosis present

## 2022-03-20 DIAGNOSIS — Z4789 Encounter for other orthopedic aftercare: Secondary | ICD-10-CM | POA: Diagnosis not present

## 2022-03-20 DIAGNOSIS — K219 Gastro-esophageal reflux disease without esophagitis: Secondary | ICD-10-CM | POA: Diagnosis present

## 2022-03-20 DIAGNOSIS — N182 Chronic kidney disease, stage 2 (mild): Secondary | ICD-10-CM | POA: Diagnosis present

## 2022-03-20 DIAGNOSIS — M1990 Unspecified osteoarthritis, unspecified site: Secondary | ICD-10-CM | POA: Diagnosis present

## 2022-03-20 DIAGNOSIS — Z9842 Cataract extraction status, left eye: Secondary | ICD-10-CM

## 2022-03-20 DIAGNOSIS — Z882 Allergy status to sulfonamides status: Secondary | ICD-10-CM

## 2022-03-20 DIAGNOSIS — Z885 Allergy status to narcotic agent status: Secondary | ICD-10-CM

## 2022-03-20 DIAGNOSIS — E7800 Pure hypercholesterolemia, unspecified: Secondary | ICD-10-CM | POA: Diagnosis present

## 2022-03-20 DIAGNOSIS — Z8701 Personal history of pneumonia (recurrent): Secondary | ICD-10-CM

## 2022-03-20 DIAGNOSIS — R300 Dysuria: Secondary | ICD-10-CM | POA: Diagnosis not present

## 2022-03-20 DIAGNOSIS — I82451 Acute embolism and thrombosis of right peroneal vein: Secondary | ICD-10-CM | POA: Diagnosis present

## 2022-03-20 DIAGNOSIS — M5416 Radiculopathy, lumbar region: Secondary | ICD-10-CM

## 2022-03-20 DIAGNOSIS — Z90710 Acquired absence of both cervix and uterus: Secondary | ICD-10-CM

## 2022-03-20 DIAGNOSIS — I10 Essential (primary) hypertension: Secondary | ICD-10-CM | POA: Diagnosis not present

## 2022-03-20 DIAGNOSIS — G8929 Other chronic pain: Secondary | ICD-10-CM | POA: Diagnosis present

## 2022-03-20 DIAGNOSIS — E119 Type 2 diabetes mellitus without complications: Secondary | ICD-10-CM | POA: Diagnosis not present

## 2022-03-20 DIAGNOSIS — Z7984 Long term (current) use of oral hypoglycemic drugs: Secondary | ICD-10-CM

## 2022-03-20 DIAGNOSIS — Z961 Presence of intraocular lens: Secondary | ICD-10-CM | POA: Diagnosis present

## 2022-03-20 DIAGNOSIS — Z96652 Presence of left artificial knee joint: Secondary | ICD-10-CM | POA: Diagnosis present

## 2022-03-20 DIAGNOSIS — I129 Hypertensive chronic kidney disease with stage 1 through stage 4 chronic kidney disease, or unspecified chronic kidney disease: Secondary | ICD-10-CM | POA: Diagnosis present

## 2022-03-20 DIAGNOSIS — E1169 Type 2 diabetes mellitus with other specified complication: Secondary | ICD-10-CM | POA: Diagnosis not present

## 2022-03-20 DIAGNOSIS — E871 Hypo-osmolality and hyponatremia: Secondary | ICD-10-CM | POA: Diagnosis present

## 2022-03-20 DIAGNOSIS — M25552 Pain in left hip: Secondary | ICD-10-CM | POA: Diagnosis not present

## 2022-03-20 DIAGNOSIS — R3915 Urgency of urination: Secondary | ICD-10-CM | POA: Diagnosis not present

## 2022-03-20 DIAGNOSIS — N3000 Acute cystitis without hematuria: Secondary | ICD-10-CM | POA: Diagnosis not present

## 2022-03-20 LAB — GLUCOSE, CAPILLARY
Glucose-Capillary: 177 mg/dL — ABNORMAL HIGH (ref 70–99)
Glucose-Capillary: 170 mg/dL — ABNORMAL HIGH (ref 70–99)
Glucose-Capillary: 213 mg/dL — ABNORMAL HIGH (ref 70–99)
Glucose-Capillary: 75 mg/dL (ref 70–99)
Glucose-Capillary: 207 mg/dL — ABNORMAL HIGH (ref 70–99)

## 2022-03-20 MED ORDER — PANTOPRAZOLE SODIUM 40 MG PO TBEC
40.0000 mg | DELAYED_RELEASE_TABLET | Freq: Every day | ORAL | Status: DC
  Administered 2022-03-21 – 2022-03-29 (×9): 40 mg via ORAL
  Filled 2022-03-20 (×9): qty 1

## 2022-03-20 MED ORDER — DOCUSATE SODIUM 100 MG PO CAPS
100.0000 mg | ORAL_CAPSULE | Freq: Two times a day (BID) | ORAL | Status: DC
  Administered 2022-03-21 – 2022-03-29 (×16): 100 mg via ORAL
  Filled 2022-03-20 (×18): qty 1

## 2022-03-20 MED ORDER — SENNA 8.6 MG PO TABS
1.0000 | ORAL_TABLET | Freq: Two times a day (BID) | ORAL | Status: DC
  Administered 2022-03-20 – 2022-03-29 (×17): 8.6 mg via ORAL
  Filled 2022-03-20 (×18): qty 1

## 2022-03-20 MED ORDER — INSULIN ASPART 100 UNIT/ML IJ SOLN
0.0000 [IU] | Freq: Three times a day (TID) | INTRAMUSCULAR | Status: DC
  Administered 2022-03-21: 2 [IU] via SUBCUTANEOUS
  Administered 2022-03-21: 3 [IU] via SUBCUTANEOUS
  Administered 2022-03-22 – 2022-03-29 (×17): 2 [IU] via SUBCUTANEOUS

## 2022-03-20 MED ORDER — OXYCODONE HCL 5 MG PO TABS: 5.0000 mg | ORAL_TABLET | ORAL | 0 refills | Status: DC | PRN

## 2022-03-20 MED ORDER — OXYCODONE HCL 5 MG PO TABS
10.0000 mg | ORAL_TABLET | ORAL | Status: DC | PRN
  Administered 2022-03-20 – 2022-03-26 (×14): 10 mg via ORAL
  Filled 2022-03-20 (×14): qty 2

## 2022-03-20 MED ORDER — DICYCLOMINE HCL 10 MG PO CAPS: 10.0000 mg | ORAL_CAPSULE | Freq: Three times a day (TID) | ORAL | Status: DC | PRN

## 2022-03-20 MED ORDER — ONDANSETRON HCL 4 MG/2ML IJ SOLN: 4.0000 mg | Freq: Four times a day (QID) | INTRAMUSCULAR | Status: DC | PRN

## 2022-03-20 MED ORDER — MECLIZINE HCL 25 MG PO TABS: 12.5000 mg | ORAL_TABLET | Freq: Three times a day (TID) | ORAL | Status: DC | PRN

## 2022-03-20 MED ORDER — HEPARIN SODIUM (PORCINE) 5000 UNIT/ML IJ SOLN
5000.0000 [IU] | Freq: Three times a day (TID) | INTRAMUSCULAR | Status: DC
  Administered 2022-03-20 – 2022-03-21 (×3): 5000 [IU] via SUBCUTANEOUS
  Filled 2022-03-20 (×3): qty 1

## 2022-03-20 MED ORDER — ESCITALOPRAM OXALATE 10 MG PO TABS
10.0000 mg | ORAL_TABLET | Freq: Every day | ORAL | Status: DC
  Administered 2022-03-20 – 2022-03-28 (×9): 10 mg via ORAL
  Filled 2022-03-20 (×9): qty 1

## 2022-03-20 MED ORDER — ASPIRIN 81 MG PO TBEC: 81.0000 mg | DELAYED_RELEASE_TABLET | Freq: Every day | ORAL | 0 refills | Status: AC

## 2022-03-20 MED ORDER — ALPRAZOLAM 0.25 MG PO TABS: 0.2500 mg | ORAL_TABLET | Freq: Every evening | ORAL | Status: DC | PRN

## 2022-03-20 MED ORDER — ROSUVASTATIN CALCIUM 5 MG PO TABS
5.0000 mg | ORAL_TABLET | Freq: Every day | ORAL | Status: DC
  Administered 2022-03-20 – 2022-03-28 (×9): 5 mg via ORAL
  Filled 2022-03-20 (×9): qty 1

## 2022-03-20 MED ORDER — METHOCARBAMOL 500 MG PO TABS: 500.0000 mg | ORAL_TABLET | Freq: Four times a day (QID) | ORAL | 1 refills | Status: DC | PRN

## 2022-03-20 MED ORDER — LOSARTAN POTASSIUM 50 MG PO TABS
50.0000 mg | ORAL_TABLET | Freq: Every day | ORAL | Status: DC
  Administered 2022-03-20 – 2022-03-26 (×7): 50 mg via ORAL
  Filled 2022-03-20 (×7): qty 1

## 2022-03-20 MED ORDER — OXYCODONE HCL 5 MG PO TABS
5.0000 mg | ORAL_TABLET | ORAL | Status: DC | PRN
  Administered 2022-03-20 – 2022-03-28 (×12): 5 mg via ORAL
  Filled 2022-03-20 (×12): qty 1

## 2022-03-20 MED ORDER — CHLORHEXIDINE GLUCONATE CLOTH 2 % EX PADS: 6.0000 | MEDICATED_PAD | Freq: Every day | CUTANEOUS | Status: DC

## 2022-03-20 MED ORDER — METHOCARBAMOL 500 MG PO TABS
500.0000 mg | ORAL_TABLET | Freq: Four times a day (QID) | ORAL | Status: DC | PRN
  Administered 2022-03-20 – 2022-03-29 (×6): 500 mg via ORAL
  Filled 2022-03-20 (×7): qty 1

## 2022-03-20 MED ORDER — BISACODYL 5 MG PO TBEC
5.0000 mg | DELAYED_RELEASE_TABLET | Freq: Every day | ORAL | Status: DC | PRN
  Administered 2022-03-22: 5 mg via ORAL
  Filled 2022-03-20: qty 1

## 2022-03-20 MED ORDER — MUPIROCIN 2 % EX OINT: 1.0000 | TOPICAL_OINTMENT | Freq: Two times a day (BID) | CUTANEOUS | Status: DC

## 2022-03-20 MED ORDER — ONDANSETRON HCL 4 MG PO TABS: 4.0000 mg | ORAL_TABLET | Freq: Four times a day (QID) | ORAL | Status: DC | PRN

## 2022-03-20 MED ORDER — HYDROCHLOROTHIAZIDE 12.5 MG PO TABS
12.5000 mg | ORAL_TABLET | Freq: Every day | ORAL | Status: DC
  Administered 2022-03-21 – 2022-03-29 (×9): 12.5 mg via ORAL
  Filled 2022-03-20 (×10): qty 1

## 2022-03-20 MED ORDER — TRAMADOL HCL 50 MG PO TABS
50.0000 mg | ORAL_TABLET | Freq: Four times a day (QID) | ORAL | Status: DC | PRN
  Administered 2022-03-22: 50 mg via ORAL
  Filled 2022-03-20 (×2): qty 1

## 2022-03-20 MED ORDER — GLIPIZIDE ER 2.5 MG PO TB24
2.5000 mg | ORAL_TABLET | Freq: Two times a day (BID) | ORAL | Status: DC
  Administered 2022-03-21 – 2022-03-29 (×17): 2.5 mg via ORAL
  Filled 2022-03-20 (×18): qty 1

## 2022-03-20 NOTE — Progress Notes (Signed)
Patient ID: Tammy Pineda, female   DOB: April 12, 1945, 77 y.o.   MRN: [id number]  INPATIENT REHABILITATION ADMISSION NOTE   Arrival Method: Bed     Mental Orientation: x4   Assessment: see flowsheet   Skin: CDI, 2 surgical incisions with honeycomb dressings   IV'S: N/A   Pain: 5/10   Tubes and Drains: N/A   Safety Measures: in place   Vital Signs: see flowsheet   Height and Weight: see flowsheet   Rehab Orientation: completed   Family: at bedside

## 2022-03-20 NOTE — Progress Notes (Signed)
Bilateral lower extremity venous duplex has been completed. Preliminary results can be found in CV Proc through chart review.  Results were given to the patient's nurse, Erline Levine.  03/20/22 3:38 PM Carlos Levering RVT

## 2022-03-20 NOTE — Progress Notes (Signed)
Inpatient Rehab Admissions Coordinator:   I have a bed available for this patient to admit to CIR today.  Dr. Reatha Armour in agreement.  I will let pt/family and TOC team know.   Shann Medal, PT, DPT Admissions Coordinator [phone] 03/20/22  9:59 AM

## 2022-03-20 NOTE — H&P (Signed)
Physical Medicine and Rehabilitation Admission H&P   CC: Lumbar radiculopathy  HPI: Tammy Pineda is a 77 year old right-handed female with history of type 2 diabetes mellitus, nonobstructive CAD, obesity with BMI 29.29, mild CKD with latest creatinine 0.93, hypertension, hyperlipidemia, glaucoma, left TKA 2021.  Per chart review patient lives with spouse and multiple family members.  1 level home.  Independent and driving prior to admission.  She is a retired Therapist, sports.  Presented 03/14/2022 with chronic right lower extremity radiculopathy and low back pain.  Admission chemistries unremarkable with hemoglobin 11.6, potassium 3.4, glucose 257, creatinine 0.93, surgical PCR/MRSA positive.  MRI revealed severe degenerative spondylosis at L3-4 L4-5 with severe stenosis, lateral recess and foraminal degenerative disc disease with facet arthropathy.  She has failed multiple conservative measures.  Underwent L3-5 decompression and transforaminal lumbar body fusion 03/14/2022 per Dr. Pieter Partridge Dawley.  Back brace when out of bed.  May remove to shower..  Patient was cleared to begin subcutaneous heparin for DVT prophylaxis 03/19/2022.  Patient is completing course of Bactroban for MRSA PCR screening positive.  Therapy evaluations completed due to patient's decreased functional mobility was admitted for a comprehensive rehab program. Her right sided leg numbness has improved.   Review of Systems  Constitutional:  Negative for chills and fever.  HENT:  Negative for hearing loss.   Eyes:  Negative for blurred vision and double vision.  Respiratory:  Negative for cough, shortness of breath and wheezing.   Cardiovascular:  Negative for chest pain, palpitations and leg swelling.  Gastrointestinal:  Positive for constipation. Negative for heartburn, nausea and vomiting.       GERD  Genitourinary:  Negative for dysuria, flank pain and hematuria.  Musculoskeletal:  Positive for back pain.  Skin:  Negative for rash.   Neurological:  Positive for sensory change and weakness.  Psychiatric/Behavioral:  Positive for depression.        Anxiety  All other systems reviewed and are negative.  Past Medical History:  Diagnosis Date   Anxiety    Arthritis    Chronic kidney disease    Coronary artery disease    nonobstructive by cardiac catheterization 2011 with a 30% LAD lesion   Depression    Diabetes ([place])    Type 2   GERD (gastroesophageal reflux disease)    Glaucoma    Hypercholesteremia    Hypertension    NASH (nonalcoholic steatohepatitis)    NASH   Pneumonia    30 years ago   PVC (premature ventricular contraction)    Seasonal allergies    Sleep apnea    Spinal stenosis    Past Surgical History:  Procedure Laterality Date   ANTERIOR AND POSTERIOR VAGINAL REPAIR     APPENDECTOMY  1959   BLADDER SUSPENSION     BUNIONECTOMY     CARDIAC CATHETERIZATION  2011   clean cath   CARDIOVASCULAR STRESS TEST  2022   CATARACT EXTRACTION Left 2015   CATARACT EXTRACTION W/ INTRAOCULAR LENS IMPLANT Right 2010   KNEE ARTHROSCOPY Left    NASAL RECONSTRUCTION  1976   ROBOTIC ASSISTED LAPAROSCOPIC SACROCOLPOPEXY     with mesh, urethral sling, posterior vaginal repair   SHOULDER ARTHROSCOPY Left    SHOULDER SURGERY Right 10/16/2021   repair   TOTAL ABDOMINAL HYSTERECTOMY  1976   TOTAL KNEE ARTHROPLASTY Left 01/03/2020   Procedure: TOTAL KNEE ARTHROPLASTY;  Surgeon: Gaynelle Arabian, MD;  Location: [facility];  Service: Orthopedics;  Laterality: Left;  60mn   TRANSFORAMINAL LUMBAR  INTERBODY FUSION W/ MIS 2 LEVEL Right 03/14/2022   Procedure: Minimally Invasive Surgery Decompression, Transforaminal Lumbar Interbody Fusion Lumbar three-four, Lumbar four-five;  Surgeon: Dawley, Theodoro Doing, DO;  Location: [facility];  Service: Neurosurgery;  Laterality: Right;   Family History  Problem Relation Age of Onset   Heart disease Father        open heart surgery at 50   Colon cancer Father    Heart disease Mother         open heart surgery at age 63   Hypertension Mother    Heart attack Maternal Grandfather    Stroke Paternal Grandmother    Heart block Brother    Social History:  reports that she has never smoked. She has never used smokeless tobacco. She reports that she does not drink alcohol and does not use drugs. Allergies:  Allergies  Allergen Reactions   Metformin And Related Diarrhea   Gabapentin Palpitations   Hydrocodone Itching   Plaquenil [Hydroxychloroquine] Nausea And Vomiting   Tylenol [Acetaminophen] Other (See Comments)    History of Liver Disease   Sulfamethoxazole-Trimethoprim Rash   Tape Rash   Medications Prior to Admission  Medication Sig Dispense Refill   ALPRAZolam (XANAX) 0.5 MG tablet Take 0.25 mg by mouth at bedtime as needed for anxiety.     [START ON 03/29/2022] aspirin EC (ASPIRIN 81) 81 MG tablet Take 1 tablet (81 mg total) by mouth daily. 30 tablet 0   brimonidine (ALPHAGAN) 0.2 % ophthalmic solution Place 1 drop into both eyes 2 (two) times daily.     Cholecalciferol (VITAMIN D) 125 MCG (5000 UT) CAPS Take 5,000 Units by mouth daily.     Continuous Blood Gluc Sensor (DEXCOM G7 SENSOR) MISC 3 each by Does not apply route every 30 (thirty) days. Apply 1 sensor every 10 days 9 each 4   Cyanocobalamin (VITAMIN B-12) 5000 MCG TBDP Take 1,000 mcg by mouth See admin instructions. Takes on Monday and Thursday     dicyclomine (BENTYL) 10 MG capsule Take 10 mg by mouth 3 (three) times daily as needed for spasms.     escitalopram (LEXAPRO) 10 MG tablet Take 10 mg by mouth at bedtime.      glipiZIDE (GLUCOTROL XL) 2.5 MG 24 hr tablet TAKE 1 TABLET BY MOUTH 2x DAILY BEFORE BREAKFAST (Patient taking differently: Take 2.5 mg by mouth 2 (two) times daily with breakfast and lunch.) 180 tablet 3   glucose blood (ONETOUCH VERIO) test strip Use as instructed to check blood sugar 1X daily 300 each 3   hydrochlorothiazide (MICROZIDE) 12.5 MG capsule Take 12.5 mg by mouth daily.      HYDROcodone-acetaminophen (NORCO/VICODIN) 5-325 MG tablet Take 1 tablet by mouth every 6 (six) hours as needed for moderate pain.     latanoprost (XALATAN) 0.005 % ophthalmic solution Place 1 drop into both eyes at bedtime.     losartan (COZAAR) 50 MG tablet Take 50 mg by mouth at bedtime.     meclizine (ANTIVERT) 12.5 MG tablet Take 1 tablet (12.5 mg total) by mouth 3 (three) times daily as needed for dizziness. 21 tablet 0   methocarbamol (ROBAXIN) 500 MG tablet Take 1 tablet (500 mg total) by mouth every 6 (six) hours as needed for muscle spasms. 90 tablet 1   omeprazole (PRILOSEC) 20 MG capsule Take 20 mg by mouth daily as needed (acid reflux/indigestion).     oxyCODONE (OXY IR/ROXICODONE) 5 MG immediate release tablet Take 1 tablet (5 mg total) by mouth  every 3 (three) hours as needed for moderate pain ((score 4 to 6)). 30 tablet 0   rosuvastatin (CRESTOR) 5 MG tablet Take 5 mg by mouth at bedtime.      timolol (TIMOPTIC) 0.5 % ophthalmic solution Place 1 drop into both eyes daily.     Home: Home Living Family/patient expects to be discharged to:: Private residence Living Arrangements: Spouse/significant other, Children, Other relatives Available Help at Discharge: Family, Available PRN/intermittently Type of Home: House Home Access: Level entry Home Layout: One level Bathroom Shower/Tub: Multimedia programmer: Handicapped height Bathroom Accessibility: Yes Home Equipment: Conservation officer, nature (2 wheels), BSC/3in1, Civil engineer, contracting, Wheelchair - manual Additional Comments: spouse reports 15 people living in house including grandchildren and great grandchildren. retired Retail banker History: Prior Function Prior Level of Function : Independent/Modified Independent Mobility Comments: Independent with mobility, drives, loves to line dance ADLs Comments: independent   Functional Status:  Mobility: Bed Mobility Overal bed mobility: Needs Assistance Bed Mobility: Sidelying to  Sit Rolling: Mod assist Sidelying to sit: Mod assist Sit to supine: Max assist Sit to sidelying: Max assist General bed mobility comments: Multimodal cues for technique, and physical assist to elevate trunk Transfers Overall transfer level: Needs assistance Equipment used: Rolling walker (2 wheels) Transfers: Sit to/from Stand Sit to Stand: Mod assist Bed to/from chair/wheelchair/BSC transfer type:: Stand pivot, Step pivot Stand pivot transfers: Mod assist, Max assist Step pivot transfers: Min assist General transfer comment: Light mod assist to power up with cues for hand placement Ambulation/Gait Ambulation/Gait assistance: Min assist Gait Distance (Feet): 35 Feet (x2) Assistive device: Rolling walker (2 wheels) Gait Pattern/deviations: Decreased step length - right, Decreased step length - left General Gait Details: Pt feeling shaky, but motivated to ambulate more; painful throughout gait cycle R and LLEs, multiple stops and standing rest breaks due to spasm Gait velocity: decreased   ADL: ADL Overall ADL's : Needs assistance/impaired Eating/Feeding: Set up Grooming: Wash/dry hands, Wash/dry face, Oral care, Min guard, Standing Grooming Details (indicate cue type and reason): at sink Upper Body Bathing: Minimal assistance Lower Body Bathing: Maximal assistance Upper Body Dressing : Set up, Sitting Upper Body Dressing Details (indicate cue type and reason): change gown Lower Body Dressing: Maximal assistance Lower Body Dressing Details (indicate cue type and reason): to donn socks Toilet Transfer: Minimal assistance, Ambulation, Regular Toilet, Rolling walker (2 wheels) Toilet Transfer Details (indicate cue type and reason): ambulated to bathroom and used grab bars to assist with lowering to toilet Toileting- Clothing Manipulation and Hygiene: Total assistance Toileting - Clothing Manipulation Details (indicate cue type and reason): not able to void at this time. Functional  mobility during ADLs: Minimal assistance General ADL Comments: improvement with mobility with self care   Cognition: Cognition Overall Cognitive Status: Within Functional Limits for tasks assessed Orientation Level: Oriented X4 Cognition Arousal/Alertness: Awake/alert Behavior During Therapy: WFL for tasks assessed/performed Overall Cognitive Status: Within Functional Limits for tasks assessed General Comments: following commands well  Physical Exam: Blood pressure (!) 108/38, pulse 69, temperature 97.9 F (36.6 C), resp. rate 16, height 5' 5"$  (1.651 m), weight 79.1 kg, SpO2 100 %. Physical Exam Gen: no distress, normal appearing HEENT: oral mucosa pink and moist, NCAT Cardio: Reg rate Chest: normal effort, normal rate of breathing Abd: soft, non-distended Ext: no edema Psych: pleasant, normal affect Skin:    Comments: Back incision is dressed.  Neurological:     Comments: Patient is alert and oriented x 3.  Follows commands. 4/5 strength throughout.  Sensation decreased on left lateral thigh.   Results for orders placed or performed during the hospital encounter of 03/20/22 (from the past 48 hour(s))  Glucose, capillary     Status: None   Collection Time: 03/20/22  4:16 PM  Result Value Ref Range   Glucose-Capillary 75 70 - 99 mg/dL    Comment: Glucose reference range applies only to samples taken after fasting for at least 8 hours.   VAS [place] LOWER EXTREMITY VENOUS (DVT)  Result Date: 03/20/2022  Lower Venous DVT Study Patient Name:  DEVONE DUGGAR [facility]  Date of Exam:   03/20/2022 Medical Rec #: [id number]        Accession #:    [id number] Date of Birth: 25-Jan-1946        Patient Gender: F Patient Age:   18 years Exam Location:  [facility] Procedure:      VAS [place] LOWER EXTREMITY VENOUS (DVT) Referring Phys: Lauraine Rinne --------------------------------------------------------------------------------  Indications: Swelling.  Risk Factors: None identified. Comparison Study: No  prior studies. Performing Technologist: Oliver Hum RVT  Examination Guidelines: A complete evaluation includes B-mode imaging, spectral Doppler, color Doppler, and power Doppler as needed of all accessible portions of each vessel. Bilateral testing is considered an integral part of a complete examination. Limited examinations for reoccurring indications may be performed as noted. The reflux portion of the exam is performed with the patient in reverse Trendelenburg.  +---------+---------------+---------+-----------+----------+--------------+ RIGHT    CompressibilityPhasicitySpontaneityPropertiesThrombus Aging +---------+---------------+---------+-----------+----------+--------------+ CFV      Full           Yes      Yes                                 +---------+---------------+---------+-----------+----------+--------------+ SFJ      Full                                                        +---------+---------------+---------+-----------+----------+--------------+ FV Prox  Full                                                        +---------+---------------+---------+-----------+----------+--------------+ FV Mid   Full                                                        +---------+---------------+---------+-----------+----------+--------------+ FV DistalFull                                                        +---------+---------------+---------+-----------+----------+--------------+ PFV      Full                                                        +---------+---------------+---------+-----------+----------+--------------+  POP      Full           Yes      Yes                                 +---------+---------------+---------+-----------+----------+--------------+ PTV      Full                                                        +---------+---------------+---------+-----------+----------+--------------+ PERO     Partial                                       Acute          +---------+---------------+---------+-----------+----------+--------------+   +---------+---------------+---------+-----------+----------+--------------+ LEFT     CompressibilityPhasicitySpontaneityPropertiesThrombus Aging +---------+---------------+---------+-----------+----------+--------------+ CFV      Full           Yes      Yes                                 +---------+---------------+---------+-----------+----------+--------------+ SFJ      Full                                                        +---------+---------------+---------+-----------+----------+--------------+ FV Prox  Full                                                        +---------+---------------+---------+-----------+----------+--------------+ FV Mid   Full                                                        +---------+---------------+---------+-----------+----------+--------------+ FV DistalFull                                                        +---------+---------------+---------+-----------+----------+--------------+ PFV      Full                                                        +---------+---------------+---------+-----------+----------+--------------+ POP      Full           Yes      Yes                                 +---------+---------------+---------+-----------+----------+--------------+  PTV      Full                                                        +---------+---------------+---------+-----------+----------+--------------+ PERO     Full                                                        +---------+---------------+---------+-----------+----------+--------------+     Summary: RIGHT: - Findings consistent with acute deep vein thrombosis involving the right peroneal veins. - No cystic structure found in the popliteal fossa.  LEFT: - There is no evidence of deep vein thrombosis in the lower  extremity.  - No cystic structure found in the popliteal fossa.  *See table(s) above for measurements and observations. Electronically signed by Monica Martinez MD on 03/20/2022 at 3:55:18 PM.    Final       Blood pressure (!) 108/38, pulse 69, temperature 97.9 F (36.6 C), resp. rate 16, height 5' 5"$  (1.651 m), weight 79.1 kg, SpO2 100 %.  Medical Problem List and Plan: 1. Functional deficits secondary to lumbar stenosis/radiculopathy.  Status post L3-5 decompression and transforaminal lumbar body fusion 03/14/2022.  Back brace when out of bed.  May remove to shower.  -patient may shower  -ELOS/Goals: 1 week  Admit to CIR 2.  Antithrombotics: -DVT/anticoagulation: Subcutaneous heparin.  Check vascular study  -antiplatelet therapy: N/A 3. Pain Management: Robaxin 500 mg every 6 hours as needed muscle spasms, oxycodone/tramadol as needed 4. Mood/Behavior/Sleep: Lexapro 10 mg nightly, Xanax 0.25 mg bedtime as needed  -antipsychotic agents: N/A 5. Neuropsych/cognition: This patient is capable of making decisions on her own behalf. 6. Skin/Wound Care: Routine skin checks 7. Fluids/Electrolytes/Nutrition: Routine in and outs with follow-up chemistries 8.  MRSA PCR screening positive.  Completed course of Bactroban 9.  Diabetes mellitus.  Glucotrol 2.5 mg twice daily. 10.  Hypertension.  HCTZ 12.5 mg daily, Cozaar 50 mg nightly, monitor with increased mobility 11.  GERD.  Continue Protonix 12.  Hyperlipidemia.  Continue Crestor 13.  History of CAD.  Nonobstructive by cardiac catheterization 2011.  Will discuss with neurosurgery when to resume low-dose aspirin 14.  Constipation.  Continue Colace 100 mg twice daily, Dulcolax tablet daily as needed 15.  OSA.  CPAP  Lavon Paganini Angiulli, PA-C   Izora Ribas, MD 03/20/2022

## 2022-03-20 NOTE — Progress Notes (Signed)
PMR Admission Coordinator Pre-Admission Assessment   Patient: Tammy Pineda is an 77 y.o., female MRN: [id number] DOB: March 31, 1945 Height: 5' 5"$  (165.1 cm) Weight: 79.8 kg   Insurance Information HMO:     PPO:      PCP:      IPA:      80/20:      OTHER:  PRIMARY: Medicare A/B      Policy#: [id number]       Subscriber:  CM Name:       Phone#:      Fax#:  Pre-Cert#:       Employer:  Benefits:  Phone #:      Name:  Eff. Date: 07/05/08 A/B verified online     Deduct: [phone]      Out of Pocket Max:       Life Max:  CIR: 100%      SNF: 20 full days Outpatient: 80%     Co-Pay: 20% Home Health: 100%      Co-Pay:  DME: 80%     Co-Pay: 20% Providers:  SECONDARY: [place]      Policy#: [id number]     Phone#: [phone]   Financial Counselor:       Phone#:    The "Data Collection Information Summary" for patients in Inpatient Rehabilitation Facilities with attached "Privacy Act [place] Records" was provided and verbally reviewed with: Patient and Family   Emergency Contact Information Contact Information       Name Relation Home Work Mobile    [place] L Sr Spouse     [phone]           Current Medical History  Patient Admitting Diagnosis: s/p L3-5 TLIF   History of Present Illness: Tammy Pineda is a 77 year old right-handed female with history of type 2 diabetes mellitus, nonobstructive CAD, obesity with BMI 29.29, mild CKD with latest creatinine 0.93, hypertension, hyperlipidemia, glaucoma, left TKA 2021.  Presented 03/14/2022 with chronic right lower extremity radiculopathy and low back pain.  Admission chemistries unremarkable with hemoglobin 11.6, potassium 3.4, glucose 257, creatinine 0.93, surgical PCR/MRSA positive.  MRI revealed severe degenerative spondylosis at L3-4 L4-5 with severe stenosis, lateral recess and foraminal degenerative disc disease with facet arthropathy.  She has failed multiple conservative measures.  Underwent L3-5 decompression and  transforaminal lumbar body fusion 03/14/2022 per Dr. Pieter Partridge Dawley.  Back brace when out of bed.  May remove to shower..  Patient was cleared to begin subcutaneous heparin for DVT prophylaxis 03/19/2022.  Patient is completing course of Bactroban for MRSA PCR screening positive.  Therapy evaluations completed and pt was recommended for a comprehensive rehab program.     Patient's medical record from [facility] has been reviewed by the rehabilitation admission coordinator and physician.   Past Medical History      Past Medical History:  Diagnosis Date   Anxiety     Arthritis     Chronic kidney disease     Coronary artery disease      nonobstructive by cardiac catheterization 2011 with a 30% LAD lesion   Depression     Diabetes (HCC)      Type 2   GERD (gastroesophageal reflux disease)     Glaucoma     Hypercholesteremia     Hypertension     NASH (nonalcoholic steatohepatitis)      NASH   Pneumonia      30 years ago   PVC (premature ventricular contraction)     Seasonal  allergies     Sleep apnea     Spinal stenosis        Has the patient had major surgery during 100 days prior to admission? Yes   Family History   family history includes Colon cancer in her father; Heart attack in her maternal grandfather; Heart block in her brother; Heart disease in her father and mother; Hypertension in her mother; Stroke in her paternal grandmother.   Current Medications   Current Facility-Administered Medications:    0.9 %  sodium chloride infusion, 250 mL, Intravenous, Continuous, Dawley, Troy C, DO, Last Rate: 1 mL/hr at 03/14/22 1744, 250 mL at 03/14/22 1744   ALPRAZolam (XANAX) tablet 0.25 mg, 0.25 mg, Oral, QHS PRN, Dawley, Troy C, DO   bisacodyl (DULCOLAX) EC tablet 5 mg, 5 mg, Oral, Daily PRN, Dawley, Troy C, DO, 5 mg at 03/20/22 [id number]   dicyclomine (BENTYL) capsule 10 mg, 10 mg, Oral, TID PRN, Dawley, Troy C, DO, 10 mg at 03/18/22 1030   docusate sodium (COLACE) capsule 100 mg, 100 mg,  Oral, BID, Dawley, Troy C, DO, 100 mg at 03/20/22 0926   escitalopram (LEXAPRO) tablet 10 mg, 10 mg, Oral, QHS, Dawley, Troy C, DO, 10 mg at 03/19/22 2129   glipiZIDE (GLUCOTROL XL) 24 hr tablet 2.5 mg, 2.5 mg, Oral, BID WC, Dawley, Troy C, DO, 2.5 mg at 03/20/22 0751   Glycerin (Adult) 2 g suppository 1 suppository, 1 suppository, Rectal, Daily PRN, Dawley, Troy C, DO, 1 suppository at 03/20/22 0552   heparin injection 5,000 Units, 5,000 Units, Subcutaneous, Q8H, Dawley, Troy C, DO, 5,000 Units at 03/20/22 0523   hydrochlorothiazide (HYDRODIURIL) tablet 12.5 mg, 12.5 mg, Oral, Daily, Dawley, Troy C, DO, 12.5 mg at 03/20/22 0926   HYDROmorphone (DILAUDID) injection 1 mg, 1 mg, Intramuscular, Q4H PRN, Dawley, Troy C, DO, 1 mg at 03/16/22 0420   insulin aspart (novoLOG) injection 0-15 Units, 0-15 Units, Subcutaneous, TID WC, Dawley, Troy C, DO, 3 Units at 03/20/22 0750   insulin aspart (novoLOG) injection 0-5 Units, 0-5 Units, Subcutaneous, QHS, Dawley, Troy C, DO, 2 Units at 03/14/22 2108   losartan (COZAAR) tablet 50 mg, 50 mg, Oral, QHS, Dawley, Troy C, DO, 50 mg at 03/17/22 2143   meclizine (ANTIVERT) tablet 12.5 mg, 12.5 mg, Oral, TID PRN, Dawley, Troy C, DO   menthol-cetylpyridinium (CEPACOL) lozenge 3 mg, 1 lozenge, Oral, PRN **OR** phenol (CHLORASEPTIC) mouth spray 1 spray, 1 spray, Mouth/Throat, PRN, Dawley, Troy C, DO   methocarbamol (ROBAXIN) tablet 500 mg, 500 mg, Oral, Q6H PRN, Dawley, Troy C, DO, 500 mg at 03/19/22 2130   ondansetron (ZOFRAN) tablet 4 mg, 4 mg, Oral, Q6H PRN **OR** ondansetron (ZOFRAN) injection 4 mg, 4 mg, Intravenous, Q6H PRN, Dawley, Troy C, DO   oxyCODONE (Oxy IR/ROXICODONE) immediate release tablet 10 mg, 10 mg, Oral, Q3H PRN, Dawley, Troy C, DO, 10 mg at 03/15/22 2343   oxyCODONE (Oxy IR/ROXICODONE) immediate release tablet 5 mg, 5 mg, Oral, Q3H PRN, Dawley, Troy C, DO, 5 mg at 03/20/22 0926   pantoprazole (PROTONIX) EC tablet 40 mg, 40 mg, Oral, Daily, Dawley, Troy  C, DO, 40 mg at 03/20/22 0926   rosuvastatin (CRESTOR) tablet 5 mg, 5 mg, Oral, QHS, Dawley, Troy C, DO, 5 mg at 03/19/22 2130   sodium chloride flush (NS) 0.9 % injection 3 mL, 3 mL, Intravenous, Q12H, Dawley, Troy C, DO, 3 mL at 03/14/22 2103   sodium chloride flush (NS) 0.9 % injection 3 mL, 3 mL,  Intravenous, PRN, Dawley, Troy C, DO   traMADol (ULTRAM) tablet 50 mg, 50 mg, Oral, Q6H PRN, Dawley, Troy C, DO, 50 mg at 03/18/22 2346   Facility-Administered Medications Ordered in Other Encounters:    regadenoson (LEXISCAN) injection SOLN 0.4 mg, 0.4 mg, Intravenous, Once, Buford Dresser, MD   technetium tetrofosmin (TC-MYOVIEW) injection 123456 millicurie, 123456 millicurie, Intravenous, Once PRN, Buford Dresser, MD   Patients Current Diet:  Diet Order                  Diet Carb Modified Fluid consistency: Thin; Room service appropriate? Yes  Diet effective now                         Precautions / Restrictions Precautions Precautions: Back Precaution Booklet Issued: Yes (comment) Precaution Comments: reviewed precautions Restrictions Weight Bearing Restrictions: No    Has the patient had 2 or more falls or a fall with injury in the past year? No   Prior Activity Level Community (5-7x/wk): fully independent, no DME, driving, likes line dancing   Prior Functional Level Self Care: Did the patient need help bathing, dressing, using the toilet or eating? Independent   Indoor Mobility: Did the patient need assistance with walking from room to room (with or without device)? Independent   Stairs: Did the patient need assistance with internal or external stairs (with or without device)? Independent   Functional Cognition: Did the patient need help planning regular tasks such as shopping or remembering to take medications? Independent   Patient Information Are you of Hispanic, Latino/a,or Spanish origin?: A. No, not of Hispanic, Latino/a, or Spanish origin What is  your race?: A. White Do you need or want an interpreter to communicate with a doctor or health care staff?: 0. No   Patient's Response To:  Health Literacy and Transportation Is the patient able to respond to health literacy and transportation needs?: Yes Health Literacy - How often do you need to have someone help you when you read instructions, pamphlets, or other written material from your doctor or pharmacy?: Never In the past 12 months, has lack of transportation kept you from medical appointments or from getting medications?: No In the past 12 months, has lack of transportation kept you from meetings, work, or from getting things needed for daily living?: No   Home Assistive Devices / [place] Devices/Equipment: Radio producer (specify quad or straight) Home Equipment: Conservation officer, nature (2 wheels), BSC/3in1, Shower seat, Wheelchair - manual   Prior Device Use: Indicate devices/aids used by the patient prior to current illness, exacerbation or injury? None of the above   Current Functional Level Cognition   Overall Cognitive Status: Within Functional Limits for tasks assessed Orientation Level: Oriented X4 General Comments: following commands well    Extremity Assessment (includes Sensation/Coordination)   Upper Extremity Assessment: Overall WFL for tasks assessed  Lower Extremity Assessment: Defer to PT evaluation     ADLs   Overall ADL's : Needs assistance/impaired Eating/Feeding: Set up Grooming: Wash/dry hands, Wash/dry face, Oral care, Min guard, Standing Grooming Details (indicate cue type and reason): at sink Upper Body Bathing: Minimal assistance Lower Body Bathing: Maximal assistance Upper Body Dressing : Set up, Sitting Upper Body Dressing Details (indicate cue type and reason): change gown Lower Body Dressing: Maximal assistance Lower Body Dressing Details (indicate cue type and reason): to donn socks Toilet Transfer: Minimal assistance, Ambulation, Regular  Toilet, Rolling walker (2 wheels) Toilet Transfer Details (indicate cue type  and reason): ambulated to bathroom and used grab bars to assist with lowering to toilet Toileting- Clothing Manipulation and Hygiene: Total assistance Toileting - Clothing Manipulation Details (indicate cue type and reason): not able to void at this time. Functional mobility during ADLs: Minimal assistance General ADL Comments: improvement with mobility with self care     Mobility   Overal bed mobility: Needs Assistance Bed Mobility: Sit to Sidelying, Rolling Rolling: Mod assist Sidelying to sit: Mod assist Sit to supine: Max assist Sit to sidelying: Mod assist General bed mobility comments: Multimodal cues for technique; light mod assist to help LEs into bed     Transfers   Overall transfer level: Needs assistance Equipment used: Rolling walker (2 wheels) Transfers: Sit to/from Stand Sit to Stand: Min assist, Mod assist Bed to/from chair/wheelchair/BSC transfer type:: Stand pivot, Step pivot Stand pivot transfers: Mod assist, Max assist Step pivot transfers: Min assist General transfer comment: Min assist to stand from recliner with slow rise; close guard to help control descent to sit to commode/EOB; Light mod assist to rise from low toilet seat; good hand placement     Ambulation / Gait / Stairs / Wheelchair Mobility   Ambulation/Gait Ambulation/Gait assistance: Min guard (with physical contact) Gait Distance (Feet): 20 Feet (x2 to/from the bathroom) Assistive device: Rolling walker (2 wheels) Gait Pattern/deviations: Decreased step length - right, Decreased step length - left General Gait Details: Painful throughout gait cycle R and LLEs, multiple stops and standing rest breaks due to spasm-like pain Gait velocity: decreased     Posture / Balance Dynamic Sitting Balance Sitting balance - Comments: LOB Balance Overall balance assessment: Needs assistance Sitting-balance support: Feet supported,  Single extremity supported Sitting balance-Leahy Scale: Fair Sitting balance - Comments: LOB Postural control: Posterior lean Standing balance support: Single extremity supported, Bilateral upper extremity supported, During functional activity Standing balance-Leahy Scale: Poor Standing balance comment: able to stand with single extremity support while performing grooming tasks     Special needs/care consideration CPAP and Skin surgical incision    Previous Home Environment (from acute therapy documentation) Living Arrangements: Spouse/significant other, Children, Other relatives Available Help at Discharge: Family, Available PRN/intermittently Type of Home: House Home Layout: One level Home Access: Level entry Bathroom Shower/Tub: Multimedia programmer: Handicapped height Bathroom Accessibility: Yes Home Care Services: No Additional Comments: spouse reports 15 people living in house including grandchildren and great grandchildren. retired Electrical engineer for Discharge Living Setting: Patient's home, Lives with (comment) ([place] family) Type of Home at Discharge: House Discharge Home Layout: One level Discharge Home Access: Level entry Discharge Bathroom Shower/Tub: Walk-in shower Discharge Bathroom Toilet: Handicapped height Discharge Bathroom Accessibility: Yes How Accessible: Accessible via wheelchair Does the patient have any problems obtaining your medications?: No   Social/Family/Support Systems Patient Roles: Spouse Contact Information: [name] Anticipated Caregiver: Eddie Dibbles Anticipated Caregiver's Contact Information: [phone] Ability/Limitations of Caregiver: up to min assist for ADLs Caregiver Availability: 24/7 Discharge Plan Discussed with Primary Caregiver: Yes Is Caregiver In Agreement with Plan?: Yes Does Caregiver/Family have Issues with Lodging/Transportation while Pt is in Rehab?: No   Goals Patient/Family Goal for  Rehab: PT/OT supervision to mod I, SLP n/a Expected length of stay: 12-14 days Additional Information: Plan to d/c home with spouse, Eddie Dibbles, as primary caregiver.  Pt also has 72 other family members in the home (varying ages including grown children and in-laws). Pt/Family Agrees to Admission and willing to participate: Yes Program Orientation Provided & Reviewed with Pt/Caregiver  Including Roles  & Responsibilities: Yes   Decrease burden of Care through IP rehab admission: n/a   Possible need for SNF placement upon discharge: not anticipated.  Plan to d/c to pt's home with spouse as primary caregiver.  He confirms he can provide 24/7 assist if needed.    Patient Condition: I have reviewed medical records from [facility], spoken with CM, and patient and spouse. I met with patient at the bedside and discussed via phone for inpatient rehabilitation assessment.  Patient will benefit from ongoing PT and OT, can actively participate in 3 hours of therapy a day 5 days of the week, and can make measurable gains during the admission.  Patient will also benefit from the coordinated team approach during an Inpatient Acute Rehabilitation admission.  The patient will receive intensive therapy as well as Rehabilitation physician, nursing, social worker, and care management interventions.  Due to safety, skin/wound care, disease management, medication administration, pain management, and patient education the patient requires 24 hour a day rehabilitation nursing.  The patient is currently min to mod assist with mobility and up to max assist with basic ADLs.  Discharge setting and therapy post discharge at home with home health is anticipated.  Patient has agreed to participate in the Acute Inpatient Rehabilitation Program and will admit today.   Preadmission Screen Completed By:  Michel Santee, PT, DPT 03/20/2022 10:00 AM ______________________________________________________________________   Discussed status with  Dr. Ranell Patrick on 03/20/22  at 10:00 AM  and received approval for admission today.   Admission Coordinator:  Michel Santee, PT, DPT time 10:00 AM Sudie Grumbling 03/20/22     Assessment/Plan: Diagnosis: Lumbar spondylolisthesis Does the need for close, 24 hr/day Medical supervision in concert with the patient's rehab needs make it unreasonable for this patient to be served in a less intensive setting? Yes Co-Morbidities requiring supervision/potential complications: overweight, type 2 diabetes, CAD, mild CKD, HTN, HLD Due to bladder management, bowel management, safety, skin/wound care, disease management, medication administration, pain management, and patient education, does the patient require 24 hr/day rehab nursing? Yes Does the patient require coordinated care of a physician, rehab nurse, PT, OT, and SLP to address physical and functional deficits in the context of the above medical diagnosis(es)? Yes Addressing deficits in the following areas: balance, endurance, locomotion, strength, transferring, bowel/bladder control, bathing, dressing, feeding, grooming, toileting, and psychosocial support Can the patient actively participate in an intensive therapy program of at least 3 hrs of therapy 5 days a week? Yes The potential for patient to make measurable gains while on inpatient rehab is excellent Anticipated functional outcomes upon discharge from inpatient rehab: modified independent PT, modified independent OT, modified independent SLP Estimated rehab length of stay to reach the above functional goals is: 7-10 days Anticipated discharge destination: Home 10. Overall Rehab/Functional Prognosis: excellent     MD Signature: Leeroy Cha, MD

## 2022-03-20 NOTE — Discharge Instructions (Addendum)
STROKE/TIA DISCHARGE INSTRUCTIONS SMOKING Cigarette smoking nearly doubles your risk of having a stroke & is the single most alterable risk factor  If you smoke or have smoked in the last 12 months, you are advised to quit smoking for your health. Most of the excess cardiovascular risk related to smoking disappears within a year of stopping. Ask you doctor about anti-smoking medications [place] Quit Line: 1-800-QUIT NOW Free Smoking Cessation Classes [phone]  CHOLESTEROL Know your levels; limit fat & cholesterol in your diet  Lipid Panel     Component Value Date/Time   CHOL 139 11/30/2020 1443   TRIG 278.0 (H) 11/30/2020 1443   HDL 42.70 11/30/2020 1443   CHOLHDL 3 11/30/2020 1443   VLDL 55.6 (H) 11/30/2020 1443   [place] 70 12/22/2009 0858     Many patients benefit from treatment even if their cholesterol is at goal. Goal: Total Cholesterol (CHOL) less than 160 Goal:  Triglycerides (TRIG) less than 150 Goal:  HDL greater than 40 Goal:  LDL (LDLCALC) less than 100   BLOOD PRESSURE American Stroke Association blood pressure target is less that 120/80 mm/Hg  Your discharge blood pressure is:    Monitor your blood pressure Limit your salt and alcohol intake Many individuals will require more than one medication for high blood pressure  DIABETES (A1c is a blood sugar average for last 3 months) Goal HGBA1c is under 7% (HBGA1c is blood sugar average for last 3 months)  Diabetes: No known diagnosis of diabetes    Lab Results  Component Value Date   HGBA1C 6.6 (A) 02/21/2022    Your HGBA1c can be lowered with medications, healthy diet, and exercise. Check your blood sugar as directed by your physician Call your physician if you experience unexplained or low blood sugars.  PHYSICAL ACTIVITY/REHABILITATION Goal is 30 minutes at least 4 days per week  Activity: Increase activity slowly, Therapies: Physical Therapy: Home Health Return to work:  Activity decreases your risk of heart  attack and stroke and makes your heart stronger.  It helps control your weight and blood pressure; helps you relax and can improve your mood. Participate in a regular exercise program. Talk with your doctor about the best form of exercise for you (dancing, walking, swimming, cycling).  DIET/WEIGHT Goal is to maintain a healthy weight  Your discharge diet is:  Diet Order             Diet Carb Modified Fluid consistency: Thin; Room service appropriate? Yes  Diet effective now                   liquids Your height is:  Height: 5' 5"$  (165.1 cm) Your current weight is: Weight: 79.1 kg Your Body Mass Index (BMI) is:  BMI (Calculated): 29.01 Following the type of diet specifically designed for you will help prevent another stroke. Your goal weight range is:   Your goal Body Mass Index (BMI) is 19-24. Healthy food habits can help reduce 3 risk factors for stroke:  High cholesterol, hypertension, and excess weight.  RESOURCES Stroke/Support Group:  Call [phone]   STROKE EDUCATION PROVIDED/REVIEWED AND GIVEN TO PATIENT Stroke warning signs and symptoms How to activate emergency medical system (call 911). Medications prescribed at discharge. Need for follow-up after discharge. Personal risk factors for stroke. Pneumonia vaccine given: No Flu vaccine given: No  Inpatient Rehab Discharge Instructions  Tammy Pineda [facility] Discharge date and time: No discharge date for patient encounter.   Activities/Precautions/ Functional Status: Activity: Back brace  when out of bed Diet: diabetic diet Wound Care: Routine skin checks Functional status:  ___ No restrictions     ___ Walk up steps independently ___ 24/7 supervision/assistance   ___ Walk up steps with assistance ___ Intermittent supervision/assistance  ___ Bathe/dress independently _x__ Walk with walker     ___ Bathe/dress with assistance ___ Walk Independently    ___ Shower independently ___ Walk with assistance    ___ Shower with  assistance ___ No alcohol     ___ Return to work/school ________  Special Instructions: No driving smoking or alcohol  Follow-up vascular study with PCP in 2 months for monitoring of propagation of right peroneal DVT   COMMUNITY REFERRALS UPON DISCHARGE:    Outpatient: PT             Agency:[facility]   Phone:[phone]              Appointment Date/Time:*Please expect follow-up within 7-10 business days to schedule your appointment. If you have not received follow-up, be sure to contact site directly.*  Medical Equipment/Items Ordered:rolling walker                                                 Agency/Supplier:[facility] [phone]     No driving smoking or alcohol  My questions have been answered and I understand these instructions. I will adhere to these goals and the provided educational materials after my discharge from the hospital.  Patient/Caregiver Signature _______________________________ Date __________  Clinician Signature _______________________________________ Date __________  Please bring this form and your medication list with you to all your follow-up doctor's appointments.  My questions have been answered, the writing is legible, and I understand these instructions.  I will adhere to these goals & educational materials that have been provided to me after my discharge from the hospital.

## 2022-03-20 NOTE — Progress Notes (Signed)
Inpatient Rehabilitation Admission Medication Review by a Pharmacist  A complete drug regimen review was completed for this patient to identify any potential clinically significant medication issues.  High Risk Drug Classes Is patient taking? Indication by Medication  Antipsychotic No   Anticoagulant Yes, as an intravenous medication HeparinSQ- VTE prophylaxis  Antibiotic No   Opioid Yes Oxycodone, tramadol-pain  Antiplatelet No   Hypoglycemics/insulin Yes SSI, glipizide XL  Vasoactive Medication Yes HCTZ, losartan- HTN  Chemotherapy No   Other Yes Alprazolam- sleep Dicyclomine- prn irritable bowel spasms. Methocarbamol- muscle spasms Ondansetron- prn nausea and vomiting Lexapro- depression Pantoprazole- GERD ppx Rosuvastatin- HLD Docustate, bisacodly- bowel regimin/ constipation     Type of Medication Issue Identified Description of Issue Recommendation(s)  Drug Interaction(s) (clinically significant)     Duplicate Therapy     Allergy     No Medication Administration End Date     Incorrect Dose     Additional Drug Therapy Needed     Significant med changes from prior encounter (inform family/care partners about these prior to discharge). ASA EC, vicodin, Vitamin B12, Vitamin D.     Alphagan, Xalatan and timoptic ophthalmic solution. Restart PTA meds when and if necessary during CIR admission or at time of discharge, if warranted   Sent secure chat message to MD/PA noted below to consider restart eye drops.  Other  Omeprazole- changed to Pantoprazole as inpatient.     Clinically significant medication issues were identified that warrant physician communication and completion of prescribed/recommended actions by midnight of the next day:  Yes -Ophthalmic solution medications  Alphagan, Xalatan and timoptic have not been resumed on CIR admission.   Name of provider notified for urgent issues identified: Dr. Dagoberto Ligas and  Marlowe [place], PA  Provider Method of  Notification:  secure chat message sent 03/20/22 4:49 PM   Pharmacist comments:  consider resuming PTA eye drops for h/o glaucoma : alphagan, xalatan (both have last Rx filled dates 02/12/2022 and Timoptic (no last Rx filled dated noted)    Time spent performing this drug regimen review (minutes):  [place], [place] Clinical Pharmacist  03/20/2022 4:21 PM

## 2022-03-21 LAB — CBC WITH DIFFERENTIAL/PLATELET
Abs Immature Granulocytes: 0.16 10*3/uL — ABNORMAL HIGH (ref 0.00–0.07)
Basophils Absolute: 0 10*3/uL (ref 0.0–0.1)
Basophils Relative: 0 %
Eosinophils Absolute: 0.3 10*3/uL (ref 0.0–0.5)
Eosinophils Relative: 3 %
HCT: 29.9 % — ABNORMAL LOW (ref 36.0–46.0)
Hemoglobin: 10.3 g/dL — ABNORMAL LOW (ref 12.0–15.0)
Immature Granulocytes: 2 %
Lymphocytes Relative: 25 %
Lymphs Abs: 2.2 10*3/uL (ref 0.7–4.0)
MCH: 30.6 pg (ref 26.0–34.0)
MCHC: 34.4 g/dL (ref 30.0–36.0)
MCV: 88.7 fL (ref 80.0–100.0)
Monocytes Absolute: 0.6 10*3/uL (ref 0.1–1.0)
Monocytes Relative: 7 %
Neutro Abs: 5.6 10*3/uL (ref 1.7–7.7)
Neutrophils Relative %: 63 %
Platelets: 248 10*3/uL (ref 150–400)
RBC: 3.37 MIL/uL — ABNORMAL LOW (ref 3.87–5.11)
RDW: 13.2 % (ref 11.5–15.5)
WBC: 8.9 10*3/uL (ref 4.0–10.5)
nRBC: 0 % (ref 0.0–0.2)

## 2022-03-21 LAB — COMPREHENSIVE METABOLIC PANEL
ALT: 42 U/L (ref 0–44)
AST: 28 U/L (ref 15–41)
Albumin: 2.6 g/dL — ABNORMAL LOW (ref 3.5–5.0)
Alkaline Phosphatase: 309 U/L — ABNORMAL HIGH (ref 38–126)
Anion gap: 7 (ref 5–15)
BUN: 17 mg/dL (ref 8–23)
CO2: 30 mmol/L (ref 22–32)
Calcium: 8.7 mg/dL — ABNORMAL LOW (ref 8.9–10.3)
Chloride: 94 mmol/L — ABNORMAL LOW (ref 98–111)
Creatinine, Ser: 0.94 mg/dL (ref 0.44–1.00)
GFR, Estimated: >60 mL/min (ref >60)
Glucose, Bld: 169 mg/dL — ABNORMAL HIGH (ref 70–99)
Potassium: 3.2 mmol/L — ABNORMAL LOW (ref 3.5–5.1)
Sodium: 131 mmol/L — ABNORMAL LOW (ref 135–145)
Total Bilirubin: 0.4 mg/dL (ref 0.3–1.2)
Total Protein: 6.8 g/dL (ref 6.5–8.1)

## 2022-03-21 LAB — GLUCOSE, CAPILLARY
Glucose-Capillary: 151 mg/dL — ABNORMAL HIGH (ref 70–99)
Glucose-Capillary: 119 mg/dL — ABNORMAL HIGH (ref 70–99)
Glucose-Capillary: 134 mg/dL — ABNORMAL HIGH (ref 70–99)
Glucose-Capillary: 149 mg/dL — ABNORMAL HIGH (ref 70–99)

## 2022-03-21 MED ORDER — BRIMONIDINE TARTRATE 0.2 % OP SOLN
1.0000 [drp] | Freq: Two times a day (BID) | OPHTHALMIC | Status: DC
  Administered 2022-03-21 – 2022-03-29 (×17): 1 [drp] via OPHTHALMIC
  Filled 2022-03-21: qty 5

## 2022-03-21 MED ORDER — POTASSIUM CHLORIDE CRYS ER 20 MEQ PO TBCR
40.0000 meq | EXTENDED_RELEASE_TABLET | Freq: Two times a day (BID) | ORAL | Status: AC
  Administered 2022-03-21 (×2): 40 meq via ORAL
  Filled 2022-03-21 (×2): qty 2

## 2022-03-21 MED ORDER — TIMOLOL MALEATE 0.5 % OP SOLN
1.0000 [drp] | Freq: Every day | OPHTHALMIC | Status: DC
  Administered 2022-03-21 – 2022-03-29 (×9): 1 [drp] via OPHTHALMIC
  Filled 2022-03-21: qty 5

## 2022-03-21 MED ORDER — LATANOPROST 0.005 % OP SOLN
1.0000 [drp] | Freq: Every day | OPHTHALMIC | Status: DC
  Administered 2022-03-21 – 2022-03-28 (×8): 1 [drp] via OPHTHALMIC
  Filled 2022-03-21: qty 2.5

## 2022-03-21 MED ORDER — SORBITOL 70 % SOLN
30.0000 mL | Freq: Once | Status: AC
  Administered 2022-03-21: 30 mL via ORAL
  Filled 2022-03-21: qty 30

## 2022-03-21 MED ORDER — ENOXAPARIN SODIUM 40 MG/0.4ML IJ SOSY
40.0000 mg | PREFILLED_SYRINGE | INTRAMUSCULAR | Status: DC
  Administered 2022-03-21 – 2022-03-29 (×9): 40 mg via SUBCUTANEOUS
  Filled 2022-03-21 (×8): qty 0.4

## 2022-03-21 NOTE — Progress Notes (Signed)
PROGRESS NOTE   Subjective/Complaints:  Pt reports hurting a lot- but meds/10 mg Oxy is more helpful  Slept 7 hours after 10 mg Oxy last night in 1 place LBM 7+ days ago.   Concerned about DVT- called Dr Dawley from room- he wants to wait on Eliquis/Xarelto or tx dose lovenox since so close ot surgery.  Willing to switch to lovenox prophylaxis.   ROS:  Pt denies SOB, abd pain, CP, N/V/ (+)C/D, and vision changes   Objective:   VAS [place] LOWER EXTREMITY VENOUS (DVT)  Result Date: 03/20/2022  Lower Venous DVT Study Patient Name:  Tammy Pineda [facility]  Date of Exam:   03/20/2022 Medical Rec #: [id number]        Accession #:    [id number] Date of Birth: [birth year]        Patient Gender: F Patient Age:   77 years Exam Location:  [facility] Procedure:      VAS [place] LOWER EXTREMITY VENOUS (DVT) Referring Phys: Lauraine Rinne --------------------------------------------------------------------------------  Indications: Swelling.  Risk Factors: None identified. Comparison Study: No prior studies. Performing Technologist: Oliver Hum RVT  Examination Guidelines: A complete evaluation includes B-mode imaging, spectral Doppler, color Doppler, and power Doppler as needed of all accessible portions of each vessel. Bilateral testing is considered an integral part of a complete examination. Limited examinations for reoccurring indications may be performed as noted. The reflux portion of the exam is performed with the patient in reverse Trendelenburg.  +---------+---------------+---------+-----------+----------+--------------+ RIGHT    CompressibilityPhasicitySpontaneityPropertiesThrombus Aging +---------+---------------+---------+-----------+----------+--------------+ CFV      Full           Yes      Yes                                 +---------+---------------+---------+-----------+----------+--------------+ SFJ      Full                                                         +---------+---------------+---------+-----------+----------+--------------+ FV Prox  Full                                                        +---------+---------------+---------+-----------+----------+--------------+ FV Mid   Full                                                        +---------+---------------+---------+-----------+----------+--------------+ FV DistalFull                                                        +---------+---------------+---------+-----------+----------+--------------+  PFV      Full                                                        +---------+---------------+---------+-----------+----------+--------------+ POP      Full           Yes      Yes                                 +---------+---------------+---------+-----------+----------+--------------+ PTV      Full                                                        +---------+---------------+---------+-----------+----------+--------------+ PERO     Partial                                      Acute          +---------+---------------+---------+-----------+----------+--------------+   +---------+---------------+---------+-----------+----------+--------------+ LEFT     CompressibilityPhasicitySpontaneityPropertiesThrombus Aging +---------+---------------+---------+-----------+----------+--------------+ CFV      Full           Yes      Yes                                 +---------+---------------+---------+-----------+----------+--------------+ SFJ      Full                                                        +---------+---------------+---------+-----------+----------+--------------+ FV Prox  Full                                                        +---------+---------------+---------+-----------+----------+--------------+ FV Mid   Full                                                         +---------+---------------+---------+-----------+----------+--------------+ FV DistalFull                                                        +---------+---------------+---------+-----------+----------+--------------+ PFV      Full                                                        +---------+---------------+---------+-----------+----------+--------------+  POP      Full           Yes      Yes                                 +---------+---------------+---------+-----------+----------+--------------+ PTV      Full                                                        +---------+---------------+---------+-----------+----------+--------------+ PERO     Full                                                        +---------+---------------+---------+-----------+----------+--------------+     Summary: RIGHT: - Findings consistent with acute deep vein thrombosis involving the right peroneal veins. - No cystic structure found in the popliteal fossa.  LEFT: - There is no evidence of deep vein thrombosis in the lower extremity.  - No cystic structure found in the popliteal fossa.  *See table(s) above for measurements and observations. Electronically signed by Monica Martinez MD on 03/20/2022 at 3:55:18 PM.    Final    Recent Labs    03/21/22 0709  WBC 8.9  HGB 10.3*  HCT 29.9*  PLT 248   Recent Labs    03/21/22 0709  NA 131*  K 3.2*  CL 94*  CO2 30  GLUCOSE 169*  BUN 17  CREATININE 0.94  CALCIUM 8.7*    Intake/Output Summary (Last 24 hours) at 03/21/2022 0847 Last data filed at 03/21/2022 0814 Gross per 24 hour  Intake 360 ml  Output 350 ml  Net 10 ml        Physical Exam: Vital Signs Blood pressure (!) 122/41, pulse 66, temperature 98.5 F (36.9 C), temperature source Oral, resp. rate 15, height '5\' 5"'$  (1.651 m), weight 79.1 kg, SpO2 96 %.   General: awake, alert, appropriate,  sitting up in bed; was wearing CPAP initially; husband at bedside;  NAD HENT: conjugate gaze; oropharynx dry- marks on face from CPAP CV: regular rate and rhythm; no JVD Pulmonary: CTA B/L; no W/R/R- good air movement- sounds good GI: soft, NT, somewhat distended; hypoactive BS Psychiatric: appropriate- interactive Neurological: Ox3 Skin:    Comments: Back incision is dressed.  Neurological:     Comments: Patient is alert and oriented x 3.  Follows commands. 4/5 strength throughout. Sensation decreased on left lateral thigh.  Assessment/Plan: 1. Functional deficits which require 3+ hours per day of interdisciplinary therapy in a comprehensive inpatient rehab setting. Physiatrist is providing close team supervision and 24 hour management of active medical problems listed below. Physiatrist and rehab team continue to assess barriers to discharge/monitor patient progress toward functional and medical goals  Care Tool:  Bathing              Bathing assist       Upper Body Dressing/Undressing Upper body dressing        Upper body assist      Lower Body Dressing/Undressing Lower body dressing            Lower body assist  Toileting Toileting    Toileting assist       Transfers Chair/bed transfer  Transfers assist           Locomotion Ambulation   Ambulation assist              Walk 10 feet activity   Assist           Walk 50 feet activity   Assist           Walk 150 feet activity   Assist           Walk 10 feet on uneven surface  activity   Assist           Wheelchair     Assist               Wheelchair 50 feet with 2 turns activity    Assist            Wheelchair 150 feet activity     Assist          Blood pressure (!) 122/41, pulse 66, temperature 98.5 F (36.9 C), temperature source Oral, resp. rate 15, height '5\' 5"'$  (1.651 m), weight 79.1 kg, SpO2 96 %.  Medical Problem List and Plan: 1. Functional deficits secondary to lumbar  stenosis/radiculopathy.  Status post L3-5 decompression and transforaminal lumbar body fusion 03/14/2022.  Back brace when out of bed.  May remove to shower.             -patient may shower             -ELOS/Goals: 1 week             Admit to CIR  First day of evaluations- con't CIR PT and OT 2.  Antithrombotics: -DVT/anticoagulation: Subcutaneous heparin.  Check vascular study 2/15- DVT R peroneal- since so low, will wait on increasing Lovenox or adding blood thinner per Dr Reatha Armour- will recheck in 1 week and see if propagates- change SQ heparin to Lovenox prevention dose.              -antiplatelet therapy: N/A 3. Pain Management: Robaxin 500 mg every 6 hours as needed muscle spasms, oxycodone/tramadol as needed  2/15- pain controlled when takes oxy 10 mg, but is very painful per pt 4. Mood/Behavior/Sleep: Lexapro 10 mg nightly, Xanax 0.25 mg bedtime as needed             -antipsychotic agents: N/A 5. Neuropsych/cognition: This patient is capable of making decisions on her own behalf. 6. Skin/Wound Care: Routine skin checks 7. Fluids/Electrolytes/Nutrition: Routine in and outs with follow-up chemistries 8.  MRSA PCR screening positive.  Completed course of Bactroban 9.  Diabetes mellitus.  Glucotrol 2.5 mg twice daily.  2/15- CBGs 75 to 207- will give 24 hours and determine if need to upgrade meds 10.  Hypertension.  HCTZ 12.5 mg daily, Cozaar 50 mg nightly, monitor with increased mobility  2/15- BP controlled- con't regimen- monitor with mobility 11.  GERD.  Continue Protonix 12.  Hyperlipidemia.  Continue Crestor 13.  History of CAD.  Nonobstructive by cardiac catheterization 2011.  Will discuss with neurosurgery when to resume low-dose aspirin 14.  Constipation.  Continue Colace 100 mg twice daily, Dulcolax tablet daily as needed  2/15- LBM 7+ days ago- will give Sorbitol 30cc x1 after therapy- might end up needing enema? 15.  OSA.  CPAP  16. Hyponatremia  2/15- will recheck Monday  since Na 131 17. Hypokalemia  2/15- will replete due  to K+ of 3.2- 40 mEq x2 and recheck in AM 18. Moderate malnutrition- Albumin 2.6-   I spent a total of 50   minutes on total care today- >50% coordination of care- due to d/w PA x2 about DVT; d/w Dr Reatha Armour and education of pt about DVT and review of labs/medical issues.      LOS: 1 days A FACE TO FACE EVALUATION WAS PERFORMED  Tammy Pineda 03/21/2022, 8:47 AM

## 2022-03-21 NOTE — Evaluation (Signed)
Occupational Therapy Assessment and Plan  Patient Details  Name: Tammy Pineda MRN: [id number] Date of Birth: [birth year]  OT Diagnosis: acute pain and muscle weakness (generalized) Rehab Potential: Rehab Potential (ACUTE ONLY): Good ELOS: ~10 days   Today's Date: 03/21/2022 OT Individual Time: [id number] OT Individual Time Calculation (min): 55 min     Hospital Problem: Principal Problem:   Lumbar radiculopathy   Past Medical History:  Past Medical History:  Diagnosis Date   Anxiety    Arthritis    Chronic kidney disease    Coronary artery disease    nonobstructive by cardiac catheterization 2011 with a 30% LAD lesion   Depression    Diabetes ([place])    Type 2   GERD (gastroesophageal reflux disease)    Glaucoma    Hypercholesteremia    Hypertension    NASH (nonalcoholic steatohepatitis)    NASH   Pneumonia    30 years ago   PVC (premature ventricular contraction)    Seasonal allergies    Sleep apnea    Spinal stenosis    Past Surgical History:  Past Surgical History:  Procedure Laterality Date   ANTERIOR AND POSTERIOR VAGINAL REPAIR     [place]  2011   clean cath   CARDIOVASCULAR STRESS TEST  2022   CATARACT EXTRACTION Left 2015   CATARACT EXTRACTION W/ INTRAOCULAR LENS IMPLANT Right 2010   KNEE ARTHROSCOPY Left    NASAL RECONSTRUCTION  1976   ROBOTIC ASSISTED LAPAROSCOPIC SACROCOLPOPEXY     with mesh, urethral sling, posterior vaginal repair   SHOULDER ARTHROSCOPY Left    SHOULDER SURGERY Right 10/16/2021   repair   TOTAL ABDOMINAL HYSTERECTOMY  1976   TOTAL KNEE ARTHROPLASTY Left 01/03/2020   Procedure: TOTAL KNEE ARTHROPLASTY;  Surgeon: Gaynelle Arabian, MD;  Location: [facility];  Service: Orthopedics;  Laterality: Left;  3mn   TRANSFORAMINAL LUMBAR INTERBODY FUSION W/ MIS 2 LEVEL Right 03/14/2022   Procedure: Minimally Invasive Surgery Decompression, Transforaminal Lumbar  Interbody Fusion Lumbar three-four, Lumbar four-five;  Surgeon: Dawley, TTheodoro Doing DO;  [facility] [facility]  Service: Neurosurgery;  Laterality: Right;    Assessment & Plan Clinical Impression: Patient is a 77y.o. year old female wright-handed female with history of type 2 diabetes mellitus, nonobstructive CAD, obesity with BMI 29.29, mild CKD with latest creatinine 0.93, hypertension, hyperlipidemia, glaucoma, left TKA 2021.  Per chart review patient lives with spouse and multiple family members.  1 level home.  Independent and driving prior to admission.  She is a retired RTherapist, sports  Presented 03/14/2022 with chronic right lower extremity radiculopathy and low back pain.  Admission chemistries unremarkable with hemoglobin 11.6, potassium 3.4, glucose 257, creatinine 0.93, surgical PCR/MRSA positive.  MRI revealed severe degenerative spondylosis at L3-4 L4-5 with severe stenosis, lateral recess and foraminal degenerative disc disease with facet arthropathy.  She has failed multiple conservative measures.  Underwent L3-5 decompression and transforaminal lumbar body fusion 03/14/2022 per Dr. TPieter PartridgeDawley.  Back brace when out of bed.  May remove to shower..  Patient was cleared to begin subcutaneous heparin for DVT prophylaxis 03/19/2022.  Patient is completing course of Bactroban for MRSA PCR screening positive.  Therapy evaluations completed due to patient's decreased functional mobility was admitted for a comprehensive rehab program. Her right sided leg numbness has improved.   Patient transferred to CIR on 03/20/2022 .    Patient currently requires max with basic  self-care skills for LB and A to don orthosis and min A for functional transfers with RW secondary to muscle weakness and acute pain, decreased cardiorespiratoy endurance, decreased coordination, and decreased sitting balance, decreased standing balance, and decreased balance strategies.  Prior to hospitalization, patient could complete ADL with  supervision.  Patient will benefit from skilled intervention to decrease level of assist with basic self-care skills and increase independence with basic self-care skills prior to discharge home with care partner.  Anticipate patient will require intermittent supervision and no further OT follow recommended.  OT - End of Session Activity Tolerance: Tolerates 30+ min activity with multiple rests Endurance Deficit: Yes OT Assessment Rehab Potential (ACUTE ONLY): Good OT Patient demonstrates impairments in the following area(s): Balance;Endurance;Motor;Pain;Skin Integrity OT Basic ADL's Functional Problem(s): Bathing;Dressing;Toileting OT Transfers Functional Problem(s): Toilet;Tub/Shower OT Plan OT Intensity: Minimum of 1-2 x/day, 45 to 90 minutes OT Frequency: 5 out of 7 days OT Duration/Estimated Length of Stay: ~10 days OT Treatment/Interventions: Balance/vestibular training;Disease mangement/prevention;Neuromuscular re-education;Self Care/advanced ADL retraining;Therapeutic Exercise;DME/adaptive equipment instruction;Skin care/wound managment;Pain management;UE/LE Strength taining/ROM;Community reintegration;Patient/family education;UE/LE Coordination activities;Discharge planning;Functional mobility training;Psychosocial support;Therapeutic Activities OT Self Feeding Anticipated Outcome(s): n/a OT Basic Self-Care Anticipated Outcome(s): mod I OT Toileting Anticipated Outcome(s): mod I OT Bathroom Transfers Anticipated Outcome(s): mod I OT Recommendation Patient destination: Home Follow Up Recommendations: None Equipment Recommended: To be determined   OT Evaluation Precautions/Restrictions  Precautions Precautions: Back;Other (comment) Precaution Comments: spine precautions, lumbar corset when OOB Restrictions Weight Bearing Restrictions: No General Chart Reviewed: Yes Family/Caregiver Present: Yes Vital Signs  Pain Pain Assessment Pain Scale: 0-10 Pain Score: 7  Pain  Type: Acute pain;Chronic pain (acute on chronic pain from surgery) Pain Location: Leg Pain Orientation: Left;Right Pain Descriptors / Indicators: Aching Pain Onset: On-going Pain Intervention(s): Medication (See eMAR);Rest;Repositioned;Distraction;Emotional support;Environmental changes Home Living/Prior [place] expects to be discharged to:: Private residence Living Arrangements: Spouse/significant other Available Help at Discharge: Family, Available 24 hours/day Type of Home: House Home Access: Level entry Home Layout: One level Bathroom Shower/Tub: Multimedia programmer: Handicapped height Bathroom Accessibility: Yes Additional Comments: retired Dentist  Lives With: Spouse, Other (Comment) Prior Function Level of Independence: Independent with gait, Independent with transfers (did not use AD but did have some mobility limitations due to pain but was able to get out into community (grocery store))  Able to Take Stairs?: Yes Driving: Yes Vision Baseline Vision/History: 1 Wears glasses Ability to See in Adequate Light: 0 Adequate Patient Visual Report: No change from baseline Perception  Perception: Within Functional Limits Praxis Praxis: Intact Cognition Cognition Overall Cognitive Status: Within Functional Limits for tasks assessed Arousal/Alertness: Awake/alert Orientation Level: Person;Place;Situation Person: Oriented Place: Oriented Situation: Oriented Memory: Appears intact Attention: Focused;Sustained Focused Attention: Appears intact Sustained Attention: Appears intact Awareness: Appears intact Safety/Judgment: Appears intact Brief Interview for Mental Status (BIMS) Repetition of Three Words (First Attempt): 3 Temporal Orientation: Year: Correct Temporal Orientation: Month: Accurate within 5 days Temporal Orientation: Day: Correct Recall: "Sock": No, could not recall Recall: "Blue": No, could not  recall Recall: "Bed": Yes, no cue required BIMS Summary Score: 11 Sensation Sensation Light Touch: Impaired Detail Peripheral sensation comments: numbness on R lateral proximal thigh, tingling in L lateral proximal thigh Light Touch Impaired Details: Impaired RLE;Impaired LLE Hot/Cold: Not tested Proprioception: Appears Intact Stereognosis: Appears Intact Coordination Gross Motor Movements are Fluid and Coordinated: No Fine Motor Movements are Fluid and Coordinated: Yes Coordination and Movement Description:decr LE coordination  Motor  Motor Motor - Skilled Clinical  Observations: generalized weakness and acute pain  Trunk/Postural Assessment  Cervical Assessment Cervical Assessment: Within Functional Limits Thoracic Assessment Thoracic Assessment: Within Functional Limits Lumbar Assessment Lumbar Assessment: Within Functional Limits Postural Control Postural Control: Within Functional Limits  Balance Balance Balance Assessed: Yes Dynamic Sitting Balance Dynamic Sitting - Balance Support: During functional activity Dynamic Sitting - Level of Assistance: 5: Stand by assistance Static Standing Balance Static Standing - Level of Assistance: 4: Min assist Dynamic Standing Balance Dynamic Standing - Level of Assistance: 4: Min assist Extremity/Trunk Assessment RUE Assessment RUE Assessment: Within Functional Limits LUE Assessment LUE Assessment: Within Functional Limits  Care Tool Care Tool Self Care Eating   Eating Assist Level: Set up assist    Oral Care    Oral Care Assist Level: Set up assist    Bathing   Body parts bathed by patient: Right arm;Left arm;Chest;Abdomen;Front perineal area;Right upper leg;Left upper leg;Face Body parts bathed by helper: Buttocks;Right lower leg;Left lower leg   Assist Level: Moderate Assistance - Patient 50 - 74%    Upper Body Dressing(including orthotics)   What is the patient wearing?: Pull over shirt;Orthosis   Assist Level:  Moderate Assistance - Patient 50 - 74%    Lower Body Dressing (excluding footwear)   What is the patient wearing?: Incontinence brief;Pants Assist for lower body dressing: Maximal Assistance - Patient 25 - 49%    Putting on/Taking off footwear   What is the patient wearing?: Shoes Assist for footwear: Total Assistance - Patient < 25%       Care Tool Toileting Toileting activity   Assist for toileting: Total Assistance - Patient < 25%     Care Tool Bed Mobility Roll left and right activity   Roll left and right assist level: Minimal Assistance - Patient > 75%    Sit to lying activity        Lying to sitting on side of bed activity   Lying to sitting on side of bed assist level: the ability to move from lying on the back to sitting on the side of the bed with no back support.: Minimal Assistance - Patient > 75%     Care Tool Transfers Sit to stand transfer   Sit to stand assist level: Minimal Assistance - Patient > 75%    Chair/bed transfer   Chair/bed transfer assist level: Minimal Assistance - Patient > 75%     Toilet transfer   Assist Level: Minimal Assistance - Patient > 75%     Care Tool Cognition  Expression of Ideas and Wants Expression of Ideas and Wants: 4. Without difficulty (complex and basic) - expresses complex messages without difficulty and with speech that is clear and easy to understand  Understanding Verbal and Non-Verbal Content Understanding Verbal and Non-Verbal Content: 4. Understands (complex and basic) - clear comprehension without cues or repetitions   Memory/Recall Ability     Refer to Care Plan for Long Term Goals  SHORT TERM GOAL WEEK 1 OT Short Term Goal 1 (Week 1): LTG=STG mod I  Recommendations for other services: None   None  Skilled Therapeutic Intervention  1:1 Ot eval initiated with OT goals, purpose and role discussed with pt and pt's husband. Pt received in bed. Min A for bed mobility to come to EOB. Total A to don back  orthosis. Pt able to come into standing with RW for UE support with min A. Pt ambulated to bathroom with RW with min A at a very slow pace for pain  management. Pt performed toilet transfer with min A with grab bars and toileting with total A. Transitioned into the shower to [facility] in shower with min A with grab bars with RW. Pt required total A for all LB bathing and for dressing. Discussed use of AE to access LEs to increase independence.  Will introduce and use AE in next sessions. Pt performed grooming at sink in seated in the w/c. Pt left sitting up to rest in prep for PT eval.    ADL ADL Eating: Set up Where Assessed-Eating: Chair Grooming: Setup Where Assessed-Grooming: Sitting at sink Upper Body Bathing: Minimal assistance Where Assessed-Upper Body Bathing: Shower Lower Body Bathing: Maximal assistance Where Assessed-Lower Body Bathing: Shower Upper Body Dressing: Moderate assistance Where Assessed-Upper Body Dressing: Chair Lower Body Dressing: Dependent Where Assessed-Lower Body Dressing: Chair Toileting: Maximal assistance Toilet Transfer: Minimal assistance Toilet Transfer Equipment: Emergency planning/management officer Transfer: Minimal assistance Social research officer, government Method: Heritage manager: Grab bars;Shower seat with back Mobility  Bed Mobility Bed Mobility: Supine to Sit;Sit to Supine Supine to Sit: Minimal Assistance - Patient > 75% Sit to Supine: Minimal Assistance - Patient > 75% Transfers Sit to Stand: Minimal Assistance - Patient > 75% Stand to Sit: Minimal Assistance - Patient > 75%   Discharge Criteria: Patient will be discharged from OT if patient refuses treatment 3 consecutive times without medical reason, if treatment goals not met, if there is a change in medical status, if patient makes no progress towards goals or if patient is discharged from hospital.  The above assessment, treatment plan, treatment alternatives and goals were discussed and  mutually agreed upon: by patient and by family  Nicoletta Ba 03/21/2022, 11:51 AM

## 2022-03-21 NOTE — Plan of Care (Signed)
  Problem: RH Balance Goal: LTG Patient will maintain dynamic standing with ADLs (OT) Description: LTG:  Patient will maintain dynamic standing balance with assist during activities of daily living (OT)  Flowsheets (Taken 03/21/2022 1203) LTG: Pt will maintain dynamic standing balance during ADLs with: Independent with assistive device   Problem: Sit to Stand Goal: LTG:  Patient will perform sit to stand in prep for activites of daily living with assistance level (OT) Description: LTG:  Patient will perform sit to stand in prep for activites of daily living with assistance level (OT) Flowsheets (Taken 03/21/2022 1203) LTG: PT will perform sit to stand in prep for activites of daily living with assistance level: Independent with assistive device   Problem: RH Bathing Goal: LTG Patient will bathe all body parts with assist levels (OT) Description: LTG: Patient will bathe all body parts with assist levels (OT) Flowsheets (Taken 03/21/2022 1203) LTG: Pt will perform bathing with assistance level/cueing: Independent with assistive device    Problem: RH Dressing Goal: LTG Patient will perform upper body dressing (OT) Description: LTG Patient will perform upper body dressing with assist, with/without cues (OT). Flowsheets (Taken 03/21/2022 1203) LTG: Pt will perform upper body dressing with assistance level of: Independent with assistive device Goal: LTG Patient will perform lower body dressing w/assist (OT) Description: LTG: Patient will perform lower body dressing with assist, with/without cues in positioning using equipment (OT) Flowsheets (Taken 03/21/2022 1203) LTG: Pt will perform lower body dressing with assistance level of: Independent with assistive device   Problem: RH Toileting Goal: LTG Patient will perform toileting task (3/3 steps) with assistance level (OT) Description: LTG: Patient will perform toileting task (3/3 steps) with assistance level (OT)  Flowsheets (Taken 03/21/2022  1203) LTG: Pt will perform toileting task (3/3 steps) with assistance level: Independent with assistive device   Problem: RH Toilet Transfers Goal: LTG Patient will perform toilet transfers w/assist (OT) Description: LTG: Patient will perform toilet transfers with assist, with/without cues using equipment (OT) Flowsheets (Taken 03/21/2022 1203) LTG: Pt will perform toilet transfers with assistance level of: Independent with assistive device   Problem: RH Tub/Shower Transfers Goal: LTG Patient will perform tub/shower transfers w/assist (OT) Description: LTG: Patient will perform tub/shower transfers with assist, with/without cues using equipment (OT) Flowsheets (Taken 03/21/2022 1203) LTG: Pt will perform tub/shower stall transfers with assistance level of: Supervision/Verbal cueing

## 2022-03-21 NOTE — Progress Notes (Addendum)
Inpatient Rehabilitation Admission Medication Review by a Pharmacist  A complete drug regimen review was completed for this patient to identify any potential clinically significant medication issues.  High Risk Drug Classes Is patient taking? Indication by Medication  Antipsychotic No   Anticoagulant Yes, as an intravenous medication HeparinSQ- VTE prophylaxis  Antibiotic No   Opioid Yes Oxycodone, tramadol-pain  Antiplatelet No   Hypoglycemics/insulin Yes SSI, glipizide XL  Vasoactive Medication Yes HCTZ, losartan- HTN  Chemotherapy No   Other Yes Alprazolam- sleep Dicyclomine- prn irritable bowel spasms. Methocarbamol- muscle spasms Ondansetron- prn nausea and vomiting Lexapro- depression Pantoprazole- GERD ppx Rosuvastatin- HLD Docustate, bisacodly- bowel regimin/ constipation     Type of Medication Issue Identified Description of Issue Recommendation(s)  Drug Interaction(s) (clinically significant)     Duplicate Therapy     Allergy     No Medication Administration End Date     Incorrect Dose     Additional Drug Therapy Needed     Significant med changes from prior encounter (inform family/care partners about these prior to discharge). ASA EC, vicodin, Vitamin B12, Vitamin D.     Alphagan, Xalatan and timoptic ophthalmic solution. Restart PTA meds when and if necessary during CIR admission or at time of discharge, if warranted   Sent secure chat message to MD/PA noted below to consider restart eye drops.  Other  Omeprazole- changed to Pantoprazole as inpatient.     Clinically significant medication issues were identified that warrant physician communication and completion of prescribed/recommended actions by midnight of the next day:    Pharmacy comments: -Ophthalmic solution medications  Alphagan, Xalatan and timoptic are resumed on 03/21/22 AM by Marlowe Shores, PA.  Name of provider notified for urgent issues identified:   Provider Method of Notification:      Time spent performing this drug regimen review (minutes):  [place], [place] Clinical Pharmacist  03/21/2022 8:40 PM

## 2022-03-21 NOTE — Progress Notes (Signed)
Inpatient Rehabilitation Care Coordinator Assessment and Plan Patient Details  Name: Tammy Pineda MRN: [id number] Date of Birth: 09-Feb-1945  Today's Date: 03/21/2022  Hospital Problems: Principal Problem:   Lumbar radiculopathy  Past Medical History:  Past Medical History:  Diagnosis Date   Anxiety    Arthritis    Chronic kidney disease    Coronary artery disease    nonobstructive by cardiac catheterization 2011 with a 30% LAD lesion   Depression    Diabetes ([place])    Type 2   GERD (gastroesophageal reflux disease)    Glaucoma    Hypercholesteremia    Hypertension    NASH (nonalcoholic steatohepatitis)    NASH   Pneumonia    30 years ago   PVC (premature ventricular contraction)    Seasonal allergies    Sleep apnea    Spinal stenosis    Past Surgical History:  Past Surgical History:  Procedure Laterality Date   ANTERIOR AND POSTERIOR VAGINAL REPAIR     APPENDECTOMY  1959   BLADDER SUSPENSION     BUNIONECTOMY     CARDIAC CATHETERIZATION  2011   clean cath   CARDIOVASCULAR STRESS TEST  2022   CATARACT EXTRACTION Left 2015   CATARACT EXTRACTION W/ INTRAOCULAR LENS IMPLANT Right 2010   KNEE ARTHROSCOPY Left    NASAL RECONSTRUCTION  1976   ROBOTIC ASSISTED LAPAROSCOPIC SACROCOLPOPEXY     with mesh, urethral sling, posterior vaginal repair   SHOULDER ARTHROSCOPY Left    SHOULDER SURGERY Right 10/16/2021   repair   TOTAL ABDOMINAL HYSTERECTOMY  1976   TOTAL KNEE ARTHROPLASTY Left 01/03/2020   Procedure: TOTAL KNEE ARTHROPLASTY;  Surgeon: Gaynelle Arabian, MD;  Location: [facility];  Service: Orthopedics;  Laterality: Left;  31mn   TRANSFORAMINAL LUMBAR INTERBODY FUSION W/ MIS 2 LEVEL Right 03/14/2022   Procedure: Minimally Invasive Surgery Decompression, Transforaminal Lumbar Interbody Fusion Lumbar three-four, Lumbar four-five;  Surgeon: Dawley, TTheodoro Doing DO;  [facility]  Service: Neurosurgery;  Laterality: Right;   Social History:  reports that she has never  smoked. She has never used smokeless tobacco. She reports that she does not drink alcohol and does not use drugs.  Family / Support Systems Marital Status: Married How Long?: 54years Patient Roles: Spouse, Parent Spouse/Significant Other: [name](husband) #[phone]: 2 children Other Supports: Various family members that live in the home Anticipated Caregiver: PEddie DibblesAbility/Limitations of Caregiver: Husband is not able to provide alot of physical assistance due to his health Caregiver Availability: 24/7 Family Dynamics: Pt lives in the home with her children, in-laws, granchildren, and great grand children. Pt and husband live in a seperate area, and often eat out.  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 Education: nursing school Health Literacy - How often do you need to have someone help you when you read instructions, pamphlets, or other written material from your doctor or pharmacy?: Never Writes: Yes Employment Status: Retired Date Retired/Disabled/Unemployed: 2010 LPublic relations account executiveIssues: Denies Guardian/Conservator: HEconomist husband PMining engineer  Abuse/Neglect Abuse/Neglect Assessment Can Be Completed: Yes Physical Abuse: Denies Verbal Abuse: Denies Sexual Abuse: Denies Exploitation of patient/patient's resources: Denies Self-Neglect: Denies  Patient response to: Social Isolation - How often do you feel lonely or isolated from those around you?: Never  Emotional Status Pt's affect, behavior and adjustment status: Pt in good spirits. Reports she slept well last night due to medication regimen. Recent  Psychosocial Issues: Denies Psychiatric History: Denies Substance Abuse History: Denies  Patient / Family Perceptions, Expectations & Goals Pt/Family understanding of illness & functional limitations: Pt and family have a general understanding  of care needs Premorbid pt/family roles/activities: Independent Anticipated changes in roles/activities/participation: Assistance with ADLs/IADLs Pt/family expectations/goals: Pt goal is getting over the pain.  Community Resources Express Scripts: None Premorbid Home Care/DME Agencies: None Transportation available at discharge: [name] Is the patient able to respond to transportation needs?: Yes In the past 12 months, has lack of transportation kept you from medical appointments or from getting medications?: No In the past 12 months, has lack of transportation kept you from meetings, work, or from getting things needed for daily living?: No Resource referrals recommended: Neuropsychology  Discharge Planning Living Arrangements: Spouse/significant other Support Systems: Spouse/significant other, Children, Other relatives Type of Residence: Private residence Insurance Resources: Chartered certified accountant Resources: Family Support, Social Security Financial Screen Referred: No Living Expenses: Own Money Management: Patient, Spouse Does the patient have any problems obtaining your medications?: No Home Management: Pt and husband manage their living quarters. Patient/Family Preliminary Plans: No changes Care Coordinator Anticipated Follow Up Needs: HH/OP  Clinical Impression Pt is not a veteran. HCPOA- [name]. DME- canes, RW, 3in1 Bsc, shower chair, walk in shower with grab bars, and grab bards in bathroom near toilet.   Joanthan Hlavacek A Clarice Bonaventure 03/21/2022, 11:36 AM

## 2022-03-21 NOTE — Plan of Care (Signed)
  Problem: RH Balance Goal: LTG Patient will maintain dynamic sitting balance (PT) Description: LTG:  Patient will maintain dynamic sitting balance with assistance during mobility activities (PT) Flowsheets (Taken 03/21/2022 1246) LTG: Pt will maintain dynamic sitting balance during mobility activities with:: Independent with assistive device  Goal: LTG Patient will maintain dynamic standing balance (PT) Description: LTG:  Patient will maintain dynamic standing balance with assistance during mobility activities (PT) Flowsheets (Taken 03/21/2022 1246) LTG: Pt will maintain dynamic standing balance during mobility activities with:: Supervision/Verbal cueing   Problem: Sit to Stand Goal: LTG:  Patient will perform sit to stand with assistance level (PT) Description: LTG:  Patient will perform sit to stand with assistance level (PT) Flowsheets (Taken 03/21/2022 1246) LTG: PT will perform sit to stand in preparation for functional mobility with assistance level: Independent with assistive device   Problem: RH Bed Mobility Goal: LTG Patient will perform bed mobility with assist (PT) Description: LTG: Patient will perform bed mobility with assistance, with/without cues (PT). Flowsheets (Taken 03/21/2022 1246) LTG: Pt will perform bed mobility with assistance level of: Independent with assistive device    Problem: RH Bed to Chair Transfers Goal: LTG Patient will perform bed/chair transfers w/assist (PT) Description: LTG: Patient will perform bed to chair transfers with assistance (PT). Flowsheets (Taken 03/21/2022 1246) LTG: Pt will perform Bed to Chair Transfers with assistance level: Independent with assistive device    Problem: RH Car Transfers Goal: LTG Patient will perform car transfers with assist (PT) Description: LTG: Patient will perform car transfers with assistance (PT). Flowsheets (Taken 03/21/2022 1246) LTG: Pt will perform car transfers with assist:: Supervision/Verbal cueing    Problem: RH Ambulation Goal: LTG Patient will ambulate in controlled environment (PT) Description: LTG: Patient will ambulate in a controlled environment, # of feet with assistance (PT). Flowsheets (Taken 03/21/2022 1246) LTG: Pt will ambulate in controlled environ  assist needed:: Supervision/Verbal cueing LTG: Ambulation distance in controlled environment: 147f using LRAD Goal: LTG Patient will ambulate in home environment (PT) Description: LTG: Patient will ambulate in home environment, # of feet with assistance (PT). Flowsheets (Taken 03/21/2022 1246) LTG: Pt will ambulate in home environ  assist needed:: Supervision/Verbal cueing LTG: Ambulation distance in home environment: 569fusing LRAD

## 2022-03-21 NOTE — Progress Notes (Signed)
Inpatient Rehabilitation  Patient information reviewed and entered into eRehab system by Maygan Koeller Japneet Staggs, OTR/L, Rehab Quality Coordinator.   Information including medical coding, functional ability and quality indicators will be reviewed and updated through discharge.   

## 2022-03-21 NOTE — Progress Notes (Signed)
CPAP refused 

## 2022-03-21 NOTE — Discharge Summary (Signed)
Physician Discharge Summary  Patient ID: Tammy Pineda MRN: [id number] DOB/AGE: 79-Apr-1947 79 y.o.  Admit date: 03/14/2022 Discharge date: 03/21/2022  Admission Diagnoses:  L3-4, L4-5 degenerative spondylosis, L3-4 spondylolisthesis and stenosis with radiculopathy  Discharge Diagnoses:  Same Principal Problem:   Spondylolisthesis, lumbar region   Discharged Condition: Stable  Hospital Course:  Tammy Pineda is a 77 y.o. female underwent an elective L3-4, L4-5 minimally invasive decompression, transforaminal lumbar interbody fusion.  She tolerated surgery well.  Postoperatively she had an uneventful hospitalization course.  She progressed slowly with physical therapy and Occupational Therapy, and ultimately was a good candidate for inpatient rehab.  Her pain was controlled on oral medication, she was having normal bowel bladder function.  Her incisions were clean dry and intact.  Treatments: Surgery -L3-4, L4-5 minimally invasive decompression, transforaminal lumbar to body fusion  Discharge Exam: Blood pressure (!) 127/43, pulse 69, temperature 98.5 F (36.9 C), temperature source Oral, resp. rate 16, height 5' 5"$  (1.651 m), weight 79.8 kg, SpO2 94 %. Awake, alert, oriented x 3 Speech fluent, appropriate CN grossly intact 5/5 BUE 4+BLE Wounds c/d/i  Disposition: Discharge disposition: 90-DC/txfr to inpt rehab facility with planned acute care hosp IP admission       Discharge Instructions     Incentive spirometry RT   Complete by: As directed       Allergies as of 03/20/2022       Reactions   Metformin And Related Diarrhea   Gabapentin Palpitations   Hydrocodone Itching   Plaquenil [hydroxychloroquine] Nausea And Vomiting   Tylenol [acetaminophen] Other (See Comments)   History of Liver Disease   Sulfamethoxazole-trimethoprim Rash   Tape Rash        Medication List     TAKE these medications    ALPRAZolam 0.5 MG tablet Commonly known as:  XANAX Take 0.25 mg by mouth at bedtime as needed for anxiety.   aspirin EC 81 MG tablet Commonly known as: Aspirin 81 Take 1 tablet (81 mg total) by mouth daily. Start taking on: March 29, 2022 What changed:  medication strength These instructions start on March 29, 2022. If you are unsure what to do until then, ask your doctor or other care provider.   brimonidine 0.2 % ophthalmic solution Commonly known as: ALPHAGAN Place 1 drop into both eyes 2 (two) times daily.   Dexcom G7 Sensor Misc 3 each by Does not apply route every 30 (thirty) days. Apply 1 sensor every 10 days   dicyclomine 10 MG capsule Commonly known as: BENTYL Take 10 mg by mouth 3 (three) times daily as needed for spasms.   escitalopram 10 MG tablet Commonly known as: LEXAPRO Take 10 mg by mouth at bedtime.   glipiZIDE 2.5 MG 24 hr tablet Commonly known as: GLUCOTROL XL TAKE 1 TABLET BY MOUTH 2x DAILY BEFORE BREAKFAST What changed:  how much to take how to take this when to take this additional instructions   hydrochlorothiazide 12.5 MG capsule Commonly known as: MICROZIDE Take 12.5 mg by mouth daily.   HYDROcodone-acetaminophen 5-325 MG tablet Commonly known as: NORCO/VICODIN Take 1 tablet by mouth every 6 (six) hours as needed for moderate pain.   latanoprost 0.005 % ophthalmic solution Commonly known as: XALATAN Place 1 drop into both eyes at bedtime.   losartan 50 MG tablet Commonly known as: COZAAR Take 50 mg by mouth at bedtime.   meclizine 12.5 MG tablet Commonly known as: ANTIVERT Take 1 tablet (12.5 mg total) by mouth 3 (  three) times daily as needed for dizziness.   methocarbamol 500 MG tablet Commonly known as: ROBAXIN Take 1 tablet (500 mg total) by mouth every 6 (six) hours as needed for muscle spasms.   omeprazole 20 MG capsule Commonly known as: PRILOSEC Take 20 mg by mouth daily as needed (acid reflux/indigestion).   OneTouch Verio test strip Generic drug: glucose  blood Use as instructed to check blood sugar 1X daily   oxyCODONE 5 MG immediate release tablet Commonly known as: Oxy IR/ROXICODONE Take 1 tablet (5 mg total) by mouth every 3 (three) hours as needed for moderate pain ((score 4 to 6)).   rosuvastatin 5 MG tablet Commonly known as: CRESTOR Take 5 mg by mouth at bedtime.   timolol 0.5 % ophthalmic solution Commonly known as: TIMOPTIC Place 1 drop into both eyes daily.   Vitamin B-12 5000 MCG Tbdp Take 1,000 mcg by mouth See admin instructions. Takes on Monday and Thursday   Vitamin D 125 MCG (5000 UT) Caps Take 5,000 Units by mouth daily.        Follow-up [facility] Follow up.   Why: The home health agency will contact you for the first home visit Contact information: [phone]        Aeryn Medici, Pieter Partridge C, DO Follow up in 2 week(s).   Contact information: [address] [place] [address] [phone]                 Signed: Theodoro Doing Charliee Krenz 03/21/2022, 2:32 PM

## 2022-03-21 NOTE — Evaluation (Signed)
Physical Therapy Assessment and Plan  Patient Details  Name: Tammy Pineda MRN: [id number] Date of Birth: 1945-04-07  PT Diagnosis: Abnormal posture, Abnormality of gait, Difficulty walking, Impaired sensation, Low back pain, Muscle spasms, Muscle weakness, Pain in B LEs, and Post laminectomy syndrom Rehab Potential: Good ELOS: ~10days   Today's Date: 03/21/2022 PT Individual Time: [id number] PT Individual Time Calculation (min): 72 min    Hospital Problem: Principal Problem:   Lumbar radiculopathy   Past Medical History:  Past Medical History:  Diagnosis Date   Anxiety    Arthritis    Chronic kidney disease    Coronary artery disease    nonobstructive by cardiac catheterization 2011 with a 30% LAD lesion   Depression    Diabetes ([place])    Type 2   GERD (gastroesophageal reflux disease)    Glaucoma    Hypercholesteremia    Hypertension    NASH (nonalcoholic steatohepatitis)    NASH   Pneumonia    30 years ago   PVC (premature ventricular contraction)    Seasonal allergies    Sleep apnea    Spinal stenosis    Past Surgical History:  Past Surgical History:  Procedure Laterality Date   ANTERIOR AND POSTERIOR VAGINAL [place]  2011   clean cath   CARDIOVASCULAR STRESS TEST  2022   CATARACT EXTRACTION Left 2015   CATARACT EXTRACTION W/ INTRAOCULAR LENS IMPLANT Right 2010   KNEE ARTHROSCOPY Left    NASAL RECONSTRUCTION  1976   ROBOTIC ASSISTED LAPAROSCOPIC SACROCOLPOPEXY     with mesh, urethral sling, posterior vaginal repair   SHOULDER ARTHROSCOPY Left    SHOULDER SURGERY Right 10/16/2021   repair   TOTAL ABDOMINAL HYSTERECTOMY  1976   TOTAL KNEE ARTHROPLASTY Left 01/03/2020   Procedure: TOTAL KNEE ARTHROPLASTY;  Surgeon: Gaynelle Arabian, MD;  Location: [facility];  Service: Orthopedics;  Laterality: Left;  17mn   TRANSFORAMINAL LUMBAR INTERBODY FUSION W/ MIS 2 LEVEL Right  03/14/2022   Procedure: Minimally Invasive Surgery Decompression, Transforaminal Lumbar Interbody Fusion Lumbar three-four, Lumbar four-five;  Surgeon: Dawley, TTheodoro Doing DO;  [facility]  Service: Neurosurgery;  Laterality: Right;    Assessment & Plan Clinical Impression: Patient is a 77y.o. year old right-handed female with history of type 2 diabetes mellitus, nonobstructive CAD, obesity with BMI 29.29, mild CKD with latest creatinine 0.93, hypertension, hyperlipidemia, glaucoma, left TKA 2021.  Per chart review patient lives with spouse and multiple family members.  1 level home.  Independent and driving prior to admission.  She is a retired RTherapist, sports  Presented 03/14/2022 with chronic right lower extremity radiculopathy and low back pain.  Admission chemistries unremarkable with hemoglobin 11.6, potassium 3.4, glucose 257, creatinine 0.93, surgical PCR/MRSA positive.  MRI revealed severe degenerative spondylosis at L3-4 L4-5 with severe stenosis, lateral recess and foraminal degenerative disc disease with facet arthropathy.  She has failed multiple conservative measures.  Underwent L3-5 decompression and transforaminal lumbar body fusion 03/14/2022 per Dr. TPieter PartridgeDawley.  Back brace when out of bed.  May remove to shower..  Patient was cleared to begin subcutaneous heparin for DVT prophylaxis 03/19/2022.  Patient is completing course of Bactroban for MRSA PCR screening positive.  Therapy evaluations completed due to patient's decreased functional mobility was admitted for a comprehensive rehab program. Her right sided leg numbness has improved. Patient transferred to CIR on 03/20/2022 .  Patient currently requires min assist with mobility secondary to muscle weakness, decreased cardiorespiratoy endurance, unbalanced muscle activation, and decreased standing balance, decreased postural control, and decreased balance strategies.  Prior to hospitalization, patient was independent  with mobility and lived with Spouse,  Other (Comment) in a House home.  Home access is  Level entry.  Patient will benefit from skilled PT intervention to maximize safe functional mobility, minimize fall risk, and decrease caregiver burden for planned discharge home with 24 hour supervision.  Anticipate patient will benefit from follow up OP at discharge.  PT - End of Session Activity Tolerance: Tolerates 30+ min activity with multiple rests Endurance Deficit: Yes Endurance Deficit Description: requires rest breaks PT Assessment Rehab Potential (ACUTE/IP ONLY): Good PT Patient demonstrates impairments in the following area(s): Balance;Sensory;Endurance;Skin Integrity;Motor;Nutrition;Pain PT Transfers Functional Problem(s): Bed Mobility;Bed to Chair;Car;Furniture PT Locomotion Functional Problem(s): Ambulation;Stairs PT Plan PT Intensity: Minimum of 1-2 x/day ,45 to 90 minutes PT Frequency: 5 out of 7 days PT Duration Estimated Length of Stay: ~10days PT Treatment/Interventions: Ambulation/gait training;Community reintegration;DME/adaptive equipment instruction;Neuromuscular re-education;Psychosocial support;Stair training;UE/LE Strength taining/ROM;Balance/vestibular training;Discharge planning;Functional electrical stimulation;Pain management;Skin care/wound management;Therapeutic Activities;UE/LE Coordination activities;Disease management/prevention;Functional mobility training;Patient/family education;Splinting/orthotics;Therapeutic Exercise PT Transfers Anticipated Outcome(s): mod-I using LRAD PT Locomotion Anticipated Outcome(s): supervision using LRAD PT Recommendation Recommendations for Other Services: Therapeutic Recreation consult Therapeutic Recreation Interventions: Stress management;Outing/community reintergration Follow Up Recommendations: Outpatient PT;24 hour supervision/assistance Patient destination: Home Equipment Recommended: To be determined   PT  Evaluation Precautions/Restrictions Precautions Precautions: Back;Other (comment) Precaution Comments: spine precautions, lumbar corset when OOB Restrictions Weight Bearing Restrictions: No Pain Pain Assessment Pain Scale: 0-10 Pain Score: 7  Pain Type: Acute pain;Chronic pain (acute on chronic pain from surgery) Pain Location: Leg Pain Orientation: Left;Right Pain Descriptors / Indicators: Aching Pain Onset: On-going Pain Intervention(s): Medication (See eMAR);Rest;Repositioned;Distraction;Emotional support;Environmental changes Pain Interference Pain Interference Pain Effect on Sleep: 3. Frequently Pain Interference with Therapy Activities: 2. Occasionally Pain Interference with Day-to-Day Activities: 4. Almost constantly Home Living/Prior Functioning Home Living Available Help at Discharge: Family;Available 24 hours/day Type of Home: House (lives in a part of the family's house - lives with 52fmily members) Home Access: Level entry (long walk from car to their section of the house [phone] Home Layout: One level Additional Comments: retired opDentistLives With: Spouse;Other (Comment) (husband, PaEddie DibblesPrior Function Level of Independence: Independent with gait;Independent with transfers (did not use AD but did have some mobility limitations due to pain but was able to get out into community (grocery store))  Able to Take Stairs?: Yes Driving: Yes Vision/Perception  Vision - History Ability to See in Adequate Light: 0 Adequate Perception Perception: Within Functional Limits Praxis Praxis: Intact  Cognition Overall Cognitive Status: Within Functional Limits for tasks assessed Arousal/Alertness: Awake/alert Orientation Level: Oriented X4 Year: 2024 Month: February Day of Week: Correct Attention: Focused;Sustained Focused Attention: Appears intact Sustained Attention: Appears intact Memory: Appears intact Awareness: Appears intact Safety/Judgment:  Appears intact Sensation Sensation Light Touch: Impaired Detail Peripheral sensation comments: numbness on R lateral proximal thigh, tingling in L lateral proximal thigh Light Touch Impaired Details: Impaired RLE;Impaired LLE Hot/Cold: Not tested Proprioception: Appears Intact Stereognosis: Not tested Coordination Gross Motor Movements are Fluid and Coordinated: No Fine Motor Movements are Fluid and Coordinated: Yes Coordination and Movement Description: GM movements impaired due to B LE weakness and pain Motor  Motor Motor: Other (comment) Motor - Skilled Clinical Observations: generalized weakness and acute on chronic pain   Trunk/Postural Assessment  Cervical Assessment Cervical Assessment: Within  Functional Limits Thoracic Assessment Thoracic Assessment: Within Functional Limits Lumbar Assessment Lumbar Assessment: Exceptions to [facility] (lumbar spine corset when OOB) Postural Control Postural Control: Within Functional Limits (using B UE support on RW)  Balance Balance Balance Assessed: Yes Static Sitting Balance Static Sitting - Balance Support: Feet supported Static Sitting - Level of Assistance: 5: Stand by assistance Dynamic Sitting Balance Dynamic Sitting - Balance Support: During functional activity Dynamic Sitting - Level of Assistance: 5: Stand by assistance Static Standing Balance Static Standing - Balance Support: During functional activity;Bilateral upper extremity supported Static Standing - Level of Assistance: 4: Min assist;Other (comment) (CGA) Dynamic Standing Balance Dynamic Standing - Balance Support: During functional activity;Bilateral upper extremity supported Dynamic Standing - Level of Assistance: 4: Min assist Extremity Assessment      RLE Assessment RLE Assessment: Exceptions to [facility] General Strength Comments: assessed in sitting RLE Strength Right Hip Flexion: 3-/5 Right Knee Flexion: 3+/5 Right Knee Extension: 4-/5 Right Ankle  Dorsiflexion: 3+/5 Right Ankle Plantar Flexion: 4-/5 LLE Assessment LLE Assessment: Exceptions to [facility] General Strength Comments: assessed in sitting LLE Strength Left Hip Flexion: 3-/5 Left Knee Flexion: 4-/5 Left Knee Extension: 4/5 Left Ankle Dorsiflexion: 4-/5 Left Ankle Plantar Flexion: 4-/5  Care Tool Care Tool Bed Mobility Roll left and right activity   Roll left and right assist level: Minimal Assistance - Patient > 75%    Sit to lying activity   Sit to lying assist level: Minimal Assistance - Patient > 75%    Lying to sitting on side of bed activity   Lying to sitting on side of bed assist level: the ability to move from lying on the back to sitting on the side of the bed with no back support.: Minimal Assistance - Patient > 75%     Care Tool Transfers Sit to stand transfer   Sit to stand assist level: Moderate Assistance - Patient 50 - 74% Sit to stand assistive device: Armrests;Walker  Chair/bed transfer   Chair/bed transfer assist level: Minimal Assistance - Patient > 75% Chair/bed transfer assistive device: Armrests;Walker   Physiological scientist transfer assist level: Minimal Assistance - Patient > 75% Health visitor Comment: RW    Care Corporate treasurer   Assist level: Minimal Assistance - Patient > 75% Assistive device: Walker-rolling Max distance: 33f  Walk 10 feet activity   Assist level: Minimal Assistance - Patient > 75% Assistive device: Walker-rolling   Walk 50 feet with 2 turns activity   Assist level: Minimal Assistance - Patient > 75% Assistive device: Walker-rolling  Walk 150 feet activity Walk 150 feet activity did not occur: Safety/medical concerns      Walk 10 feet on uneven surfaces activity Walk 10 feet on uneven surfaces activity did not occur: Safety/medical concerns      Stairs Stair activity did not occur: Safety/medical concerns (pain limited)        Walk up/down 1 step activity  Walk up/down 1 step or curb (drop down) activity did not occur: Safety/medical concerns      Walk up/down 4 steps activity Walk up/down 4 steps activity did not occur: Safety/medical concerns      Walk up/down 12 steps activity Walk up/down 12 steps activity did not occur: Safety/medical concerns      Pick up small objects from floor   Pick up small object from the floor assist level: Total Assistance - Patient < 25% Pick up small  object from the floor assistive device: RW  Wheelchair Is the patient using a wheelchair?: Yes     Wheelchair assist level: Dependent - Patient 0%    Wheel 50 feet with 2 turns activity   Assist Level: Dependent - Patient 0%  Wheel 150 feet activity   Assist Level: Dependent - Patient 0%    Refer to Care Plan for Long Term Goals  SHORT TERM GOAL WEEK 1 PT Short Term Goal 1 (Week 1): = to LTGs based on ELOS  Recommendations for other services: Therapeutic Recreation  Stress management and Outing/community reintegration  Skilled Therapeutic Intervention Pt received sitting in w/c with her husband, Eddie Dibbles, present and pt agreeable to therapy session; however, reporting it was a mistake to sit up between therapy sessions because she is having increased pain and fatigue. Evaluation completed (see details above) with patient education regarding purpose of PT evaluation, PT POC and goals, therapy schedule, weekly team meetings, and other CIR information including safety plan and fall risk safety. Pt performed the below functional mobility tasks with the specified levels of skilled cuing and assistance. Pt able to recall 2/3 spine precautions only requiring question cuing to recall the 3rd - therapist reinforced education on needing to wear brace when OOB per MD note. During bed mobility, pt noted to have difficulty moving LEs while in supine due to pain. During sit>stand transfers pt with impaired ability to power up into standing requiring min/mod assist and  therapist facilitating R knee extension while rising. During gait training, pt with very slow gait speed frequently pausing after each step in double limb support - therapist educating/cuing on continuous forward reciprocal gait pattern but pt states she is not ready to attempt that at this time. Therapist assisted pt with toileting at end of session. At end of session, pt left supine in bed with needs in reach, bed alarm on, and her husband present.  Mobility Bed Mobility Bed Mobility: Supine to Sit;Sit to Supine (via logroll technique) Supine to Sit: Minimal Assistance - Patient > 75% Sit to Supine: Minimal Assistance - Patient > 75% Transfers Transfers: Sit to Stand;Stand to Sit;Stand Pivot Transfers Sit to Stand: Minimal Assistance - Patient > 75%;Moderate Assistance - Patient 50-74% (poor ability to power up into standing with delayed hip/knee extension - more impaired after prolonged sitting or when fatigued) Stand to Sit: Minimal Assistance - Patient > 75% Stand Pivot Transfers: Minimal Assistance - Patient > 75% Stand Pivot Transfer Details: Tactile cues for weight shifting;Verbal cues for technique;Verbal cues for sequencing;Verbal cues for safe use of DME/AE;Verbal cues for precautions/safety;Visual cues/gestures for sequencing;Tactile cues for sequencing Transfer (Assistive device): Rolling walker Locomotion  Gait Ambulation: Yes Gait Assistance: Minimal Assistance - Patient > 75% Gait Distance (Feet): 50 Feet Assistive device: Rolling walker Gait Assistance Details: Tactile cues for sequencing;Tactile cues for weight shifting;Verbal cues for precautions/safety;Verbal cues for technique;Verbal cues for safe use of DME/AE;Verbal cues for gait pattern;Verbal cues for sequencing Gait Gait: Yes Gait Pattern: Impaired Gait Pattern: Step-through pattern (broken up gait pattern) Gait velocity: significantly decreased gait speed often stopping in double limb support Stairs / Additional  Locomotion Stairs: No Wheelchair Mobility Wheelchair Mobility: No   Discharge Criteria: Patient will be discharged from PT if patient refuses treatment 3 consecutive times without medical reason, if treatment goals not met, if there is a change in medical status, if patient makes no progress towards goals or if patient is discharged from hospital.  The above assessment, treatment plan, treatment alternatives  and goals were discussed and mutually agreed upon: by patient and by family  Tawana Scale , PT, DPT, NCS, CSRS 03/21/2022, 7:55 AM

## 2022-03-21 NOTE — Progress Notes (Signed)
Physical Therapy Session Note  Patient Details  Name: Tammy Pineda MRN: [id number] Date of Birth: March 05, 1945  Today's Date: 03/21/2022 PT Individual Time: 1415-1530 PT Individual Time Calculation (min): 75 min   Short Term Goals: Week 1:  PT Short Term Goal 1 (Week 1): = to LTGs based on ELOS  Skilled Therapeutic Interventions/Progress Updates:  pt received in bed and agreeable to therapy. Pt with reports of pain that increases with mobility. Pt instructed in sciatic nerve glides for management of pain and therapy to tolerance. Supine>sit with supervision and increased time with bed rail. Stand pivot transfer to w/c with min A for initial Sit to stand and balance. Pt moves slowly throughout session but is safe with min to CGA. Pt transported to therapy gym for time management and energy conservation. Pt performed car transfer with min A for BLE management and cueing/instruction to maintain back precautions. Pt performed step ups on 6" step x 4 with noted easier L>R. Pt then ambulated x 70 ft with RW and CGA, with slow, careful speed. Pt performed nustep 2 x 2 min at level 3 for gentle motion for pain management. Pt returned to room and to bed. Sit>supine with mod A for BLE management with reverse log roll. Pt remained and was left with all needs in reach and alarm active.   Therapy Documentation Precautions:  Precautions Precautions: Back, Other (comment) Precaution Comments: spine precautions, lumbar corset when OOB Restrictions Weight Bearing Restrictions: No General:       Therapy/Group: Individual Therapy  Mickel Fuchs 03/21/2022, 3:35 PM

## 2022-03-22 LAB — BASIC METABOLIC PANEL
Anion gap: 9 (ref 5–15)
BUN: 21 mg/dL (ref 8–23)
CO2: 27 mmol/L (ref 22–32)
Calcium: 8.9 mg/dL (ref 8.9–10.3)
Chloride: 97 mmol/L — ABNORMAL LOW (ref 98–111)
Creatinine, Ser: 0.95 mg/dL (ref 0.44–1.00)
GFR, Estimated: >60 mL/min (ref >60)
Glucose, Bld: 152 mg/dL — ABNORMAL HIGH (ref 70–99)
Potassium: 4.3 mmol/L (ref 3.5–5.1)
Sodium: 133 mmol/L — ABNORMAL LOW (ref 135–145)

## 2022-03-22 LAB — GLUCOSE, CAPILLARY
Glucose-Capillary: 148 mg/dL — ABNORMAL HIGH (ref 70–99)
Glucose-Capillary: 133 mg/dL — ABNORMAL HIGH (ref 70–99)
Glucose-Capillary: 96 mg/dL (ref 70–99)
Glucose-Capillary: 132 mg/dL — ABNORMAL HIGH (ref 70–99)

## 2022-03-22 MED ORDER — POLYETHYLENE GLYCOL 3350 17 G PO PACK
17.0000 g | PACK | Freq: Every day | ORAL | Status: DC
  Administered 2022-03-22 – 2022-03-28 (×7): 17 g via ORAL
  Filled 2022-03-22 (×6): qty 1

## 2022-03-22 MED ORDER — SORBITOL 70 % SOLN
60.0000 mL | Freq: Once | Status: AC
  Administered 2022-03-22: 60 mL via ORAL
  Filled 2022-03-22: qty 60

## 2022-03-22 NOTE — Progress Notes (Signed)
Physical Therapy Session Note  Patient Details  Name: Tammy Pineda MRN: [id number] Date of Birth: 04/28/1945  Today's Date: 03/22/2022 PT Individual Time: 1622-1722 PT Individual Time Calculation (min): 60 min   Short Term Goals: Week 1:  PT Short Term Goal 1 (Week 1): = to LTGs based on ELOS  Skilled Therapeutic Interventions/Progress Updates:   Pt received supine in bed and agreeable to PT in room due to recent Enema and constant loose stools.   Supine therex.  Stand pivot transfer to and from [facility].   Sit<>supine with supervision assist into        Therapy Documentation Precautions:  Precautions Precautions: Back, Other (comment) Precaution Comments: spine precautions, lumbar corset when OOB Restrictions Weight Bearing Restrictions: No General:   Vital Signs: Therapy Vitals Temp: 98.3 F (36.8 C) Temp Source: Oral Pulse Rate: 66 Resp: 16 BP: (!) 130/41 Patient Position (if appropriate): Lying Oxygen Therapy SpO2: 98 % O2 Device: Room Air Pain:   Mobility:   Locomotion :    Trunk/Postural Assessment :    Balance:   Exercises:   Other Treatments:      Therapy/Group: Individual Therapy  Lorie Phenix 03/22/2022, 6:13 PM

## 2022-03-22 NOTE — Progress Notes (Signed)
Occupational Therapy Session Note  Patient Details  Name: Tammy Pineda MRN: [id number] Date of Birth: 1945/11/09  Today's Date: 03/22/2022 OT Individual Time: 0945-1100 OT Individual Time Calculation (min): 75 min    Short Term Goals: Week 1:  OT Short Term Goal 1 (Week 1): LTG=STG mod I  Skilled Therapeutic Interventions/Progress Updates:    Pt received in bed and declined a shower today.   She discussed her groin and LB pain.  Had pt work on stretches from bed by opening one leg at a time into external rotation (butterfly stretch) and having her to self massage around her groin.  While she was stretching retrieved AE kit to demo to her, elastic shoe laces and shower chair for her to use tomorrow.  Placed elastic laces in her sneakers.  Pt able to sit to EOB with S.  From EOB, practiced using reacher to don pants over feet. She can cross her legs but not fully into figure 4, so may need reacher temporarily.  Pt states she has 5 of them at home. Practiced with sock aid and long shoe horn.  Spouse present to observe. He said he plans to just help her but encouraged them both to let her be as independent as possible.   Pt donned corset with no A.   Supervision to stand to RW, ambulated to bathroom with CGA/min A. She did not need to toilet but practiced simulated cleansing.  To maintain precautions,  had pt stand tall and used R arm to reach behind her as she has good sh ROM.  Pt did not need to twist or arch. For the front, cued her to stand with legs wider and pt able to reach forward without bending.  Pt did well and ambulated back to bed.  She does need A to lift legs into bed. Pt resting in bed with spouse in room and all needs met.   Therapy Documentation Precautions:  Precautions Precautions: Back, Other (comment) Precaution Comments: spine precautions, lumbar corset when OOB Restrictions Weight Bearing Restrictions: No  Pain: Pt stated she was still having groin pain but able to  participate in therapy and she was not grimacing or stating it was too painful    Therapy/Group: Individual Therapy  [place] 03/22/2022, 8:57 AM

## 2022-03-22 NOTE — Progress Notes (Signed)
Soap suds enema administered. Pt had large result and tolerated well.   Gerald Stabs, RN

## 2022-03-22 NOTE — Care Management (Signed)
Inpatient [place] Individual Statement of Services  Patient Name:  EILA STAWICKI  Date:  03/22/2022  Welcome to the [facility].  Our goal is to provide you with an individualized program based on your diagnosis and situation, designed to meet your specific needs.  With this comprehensive rehabilitation program, you will be expected to participate in at least 3 hours of rehabilitation therapies Monday-Friday, with modified therapy programming on the weekends.  Your rehabilitation program will include the following services:  Physical Therapy (PT), Occupational Therapy (OT), 24 hour per day rehabilitation nursing, Therapeutic Recreaction (TR), Psychology, Neuropsychology, Care Coordinator, Rehabilitation Medicine, [place], and Other  Weekly team conferences will be held on Tuesdays to discuss your progress.  Your Inpatient Rehabilitation Care Coordinator will talk with you frequently to get your input and to update you on team discussions.  Team conferences with you and your family in attendance may also be held.  Expected length of stay: 10 days    Overall anticipated outcome: Independent with an Assistive Device  Depending on your progress and recovery, your program may change. Your Inpatient Rehabilitation Care Coordinator will coordinate services and will keep you informed of any changes. Your Inpatient Rehabilitation Care Coordinator's name and contact numbers are listed  below.  The following services may also be recommended but are not provided by the [place] will be made to provide these services after discharge if needed.  Arrangements include referral to agencies that provide these services.  Your insurance has been verified to be:  Medicare A/B  Your primary  doctor is:  Lurline Del  Pertinent information will be shared with your doctor and your insurance company.  Inpatient Rehabilitation Care Coordinator:  Cathleen Corti [id number] or ([phone]  Information discussed with and copy given to patient by: Rana Snare, 03/22/2022, 11:52 AM

## 2022-03-22 NOTE — Progress Notes (Signed)
Physical Therapy Session Note  Patient Details  Name: Tammy Pineda MRN: [id number] Date of Birth: 05-Dec-1945  Today's Date: 03/22/2022 PT Individual Time: 0800-0845 PT Individual Time Calculation (min): 45 min   Short Term Goals: Week 1:  PT Short Term Goal 1 (Week 1): = to LTGs based on ELOS  Skilled Therapeutic Interventions/Progress Updates:    Pt received seat in [facility] and agreeable to therapy. Pt's back brace already donned upon entrance. Therapist donned B ted hose and pts shoes w/ total assist to maintain pts spinal precautions. Pt transported to therapy gym dependently.   Sit to stand transfers, and stand pivot transfers throughout session were CGA w/ RW. Gait training ~70 ft w/ RW and CGA. Therapist observed decreased step height B and pt ambulated w/ decreased speed. Pt then navigating around 4 cones ~20 ft to work on AD management when turning. Pt performed 2x5 sets of stepping wt shifts over 3" yoga block. Pt expressed that this caused discomfort in LLE > RLE and felt like "something was pulling." Pt instructed on seated nerve glides w/ education on being gentle with exercise. Pt performed 1x10 bilaterally. Performed stepping wt shifts 1x5 on each LE again w/ pt expressing less discomfort than before. Pt transported back to room dependently and left in [facility] w/ chair alarm on, call bell in reach and all needs met.   Therapy Documentation Precautions:  Precautions Precautions: Back, Other (comment) Precaution Comments: spine precautions, lumbar corset when OOB Restrictions Weight Bearing Restrictions: No General:    Pain: denies any pain at start of session       Therapy/Group: Individual Therapy  [place] 03/22/2022, 4:58 PM

## 2022-03-22 NOTE — Progress Notes (Signed)
PROGRESS NOTE   Subjective/Complaints:  Pt reports no BM still- took Sorbitol but no results- will add Prune juice, miralax and Sorbitol 60cc and soap suds enema.   Pt reports main pain in groin/pelvic pain and thinks would be better if could have BM.    ROS:  Pt denies SOB, abd pain, CP, N/V/ (+)C/D, and vision changes Except for HPI  Objective:   VAS [place] LOWER EXTREMITY VENOUS (DVT)  Result Date: 03/20/2022  Lower Venous DVT Study Patient Name:  Tammy Pineda [facility]  Date of Exam:   03/20/2022 Medical Rec #: [id number]        Accession #:    [id number] Date of Birth: August 16, 1945        Patient Gender: F Patient Age:   77 years Exam Location:  [facility] Procedure:      VAS [place] LOWER EXTREMITY VENOUS (DVT) Referring Phys: Lauraine Rinne --------------------------------------------------------------------------------  Indications: Swelling.  Risk Factors: None identified. Comparison Study: No prior studies. Performing Technologist: Oliver Hum RVT  Examination Guidelines: A complete evaluation includes B-mode imaging, spectral Doppler, color Doppler, and power Doppler as needed of all accessible portions of each vessel. Bilateral testing is considered an integral part of a complete examination. Limited examinations for reoccurring indications may be performed as noted. The reflux portion of the exam is performed with the patient in reverse Trendelenburg.  +---------+---------------+---------+-----------+----------+--------------+ RIGHT    CompressibilityPhasicitySpontaneityPropertiesThrombus Aging +---------+---------------+---------+-----------+----------+--------------+ CFV      Full           Yes      Yes                                 +---------+---------------+---------+-----------+----------+--------------+ SFJ      Full                                                         +---------+---------------+---------+-----------+----------+--------------+ FV Prox  Full                                                        +---------+---------------+---------+-----------+----------+--------------+ FV Mid   Full                                                        +---------+---------------+---------+-----------+----------+--------------+ FV DistalFull                                                        +---------+---------------+---------+-----------+----------+--------------+ PFV  Full                                                        +---------+---------------+---------+-----------+----------+--------------+ POP      Full           Yes      Yes                                 +---------+---------------+---------+-----------+----------+--------------+ PTV      Full                                                        +---------+---------------+---------+-----------+----------+--------------+ PERO     Partial                                      Acute          +---------+---------------+---------+-----------+----------+--------------+   +---------+---------------+---------+-----------+----------+--------------+ LEFT     CompressibilityPhasicitySpontaneityPropertiesThrombus Aging +---------+---------------+---------+-----------+----------+--------------+ CFV      Full           Yes      Yes                                 +---------+---------------+---------+-----------+----------+--------------+ SFJ      Full                                                        +---------+---------------+---------+-----------+----------+--------------+ FV Prox  Full                                                        +---------+---------------+---------+-----------+----------+--------------+ FV Mid   Full                                                         +---------+---------------+---------+-----------+----------+--------------+ FV DistalFull                                                        +---------+---------------+---------+-----------+----------+--------------+ PFV      Full                                                        +---------+---------------+---------+-----------+----------+--------------+  POP      Full           Yes      Yes                                 +---------+---------------+---------+-----------+----------+--------------+ PTV      Full                                                        +---------+---------------+---------+-----------+----------+--------------+ PERO     Full                                                        +---------+---------------+---------+-----------+----------+--------------+     Summary: RIGHT: - Findings consistent with acute deep vein thrombosis involving the right peroneal veins. - No cystic structure found in the popliteal fossa.  LEFT: - There is no evidence of deep vein thrombosis in the lower extremity.  - No cystic structure found in the popliteal fossa.  *See table(s) above for measurements and observations. Electronically signed by Monica Martinez MD on 03/20/2022 at 3:55:18 PM.    Final    Recent Labs    03/21/22 0709  WBC 8.9  HGB 10.3*  HCT 29.9*  PLT 248   Recent Labs    03/21/22 0709 03/22/22 0550  NA 131* 133*  K 3.2* 4.3  CL 94* 97*  CO2 30 27  GLUCOSE 169* 152*  BUN 17 21  CREATININE 0.94 0.95  CALCIUM 8.7* 8.9    Intake/Output Summary (Last 24 hours) at 03/22/2022 0824 Last data filed at 03/22/2022 0818 Gross per 24 hour  Intake 600 ml  Output --  Net 600 ml        Physical Exam: Vital Signs Blood pressure (!) 110/38, pulse 67, temperature 98.4 F (36.9 C), temperature source Oral, resp. rate 16, height 5' 5"$  (1.651 m), weight 79.1 kg, SpO2 97 %.    General: awake, alert, appropriate, up in w/c with NT in  room;  NAD HENT: conjugate gaze; oropharynx moist CV: regular rate and rhythm; no JVD Pulmonary: CTA B/L; no W/R/R- good air movement GI:  a little more firm; NT; somewhat distended; hypoactive BS Psychiatric: appropriate- bright, interactive affect Neurological: Ox3- Skin:    Comments: Back incision is dressed.  Neurological:     Comments: Patient is alert and oriented x 3.  Follows commands. 4/5 strength throughout. Sensation decreased on left lateral thigh.  Assessment/Plan: 1. Functional deficits which require 3+ hours per day of interdisciplinary therapy in a comprehensive inpatient rehab setting. Physiatrist is providing close team supervision and 24 hour management of active medical problems listed below. Physiatrist and rehab team continue to assess barriers to discharge/monitor patient progress toward functional and medical goals  Care Tool:  Bathing    Body parts bathed by patient: Right arm, Left arm, Chest, Abdomen, Front perineal area, Right upper leg, Left upper leg, Face   Body parts bathed by helper: Buttocks, Right lower leg, Left lower leg     Bathing assist Assist Level: Moderate Assistance - Patient 50 - 74%     Upper  Body Dressing/Undressing Upper body dressing   What is the patient wearing?: Pull over shirt, Orthosis    Upper body assist Assist Level: Moderate Assistance - Patient 50 - 74%    Lower Body Dressing/Undressing Lower body dressing      What is the patient wearing?: Incontinence brief, Pants     Lower body assist Assist for lower body dressing: Maximal Assistance - Patient 25 - 49%     Toileting Toileting    Toileting assist Assist for toileting: Maximal Assistance - Patient 25 - 49%     Transfers Chair/bed transfer  Transfers assist     Chair/bed transfer assist level: Minimal Assistance - Patient > 75% Chair/bed transfer assistive device: Armrests, Programmer, multimedia   Ambulation assist      Assist  level: Minimal Assistance - Patient > 75% Assistive device: Walker-rolling Max distance: 21f   Walk 10 feet activity   Assist     Assist level: Minimal Assistance - Patient > 75% Assistive device: Walker-rolling   Walk 50 feet activity   Assist    Assist level: Minimal Assistance - Patient > 75% Assistive device: Walker-rolling    Walk 150 feet activity   Assist Walk 150 feet activity did not occur: Safety/medical concerns         Walk 10 feet on uneven surface  activity   Assist Walk 10 feet on uneven surfaces activity did not occur: Safety/medical concerns         Wheelchair     Assist Is the patient using a wheelchair?: Yes Type of Wheelchair: Manual    Wheelchair assist level: Dependent - Patient 0%      Wheelchair 50 feet with 2 turns activity    Assist        Assist Level: Dependent - Patient 0%   Wheelchair 150 feet activity     Assist      Assist Level: Dependent - Patient 0%   Blood pressure (!) 110/38, pulse 67, temperature 98.4 F (36.9 C), temperature source Oral, resp. rate 16, height 5' 5"$  (1.651 m), weight 79.1 kg, SpO2 97 %.  Medical Problem List and Plan: 1. Functional deficits secondary to lumbar stenosis/radiculopathy.  Status post L3-5 decompression and transforaminal lumbar body fusion 03/14/2022.  Back brace when out of bed.  May remove to shower.             -patient may shower             -ELOS/Goals: 1 week             Admit to CIR  Con't CIR PT and OT- IPOC today 2.  Antithrombotics: -DVT/anticoagulation: Subcutaneous heparin.  Check vascular study 2/15- DVT R peroneal- since so low, will wait on increasing Lovenox or adding blood thinner per Dr DReatha Armour will recheck in 1 week and see if propagates- change SQ heparin to Lovenox prevention dose.              -antiplatelet therapy: N/A 3. Pain Management: Robaxin 500 mg every 6 hours as needed muscle spasms, oxycodone/tramadol as needed  2/15- pain  controlled when takes oxy 10 mg, but is very painful per pt 4. Mood/Behavior/Sleep: Lexapro 10 mg nightly, Xanax 0.25 mg bedtime as needed             -antipsychotic agents: N/A 5. Neuropsych/cognition: This patient is capable of making decisions on her own behalf. 6. Skin/Wound Care: Routine skin checks 7. Fluids/Electrolytes/Nutrition: Routine in and outs with follow-up  chemistries 8.  MRSA PCR screening positive.  Completed course of Bactroban 9.  Diabetes mellitus.  Glucotrol 2.5 mg twice daily.  2/15- CBGs 75 to 207- will give 24 hours and determine if need to upgrade meds  2/16- Doing better- 119-140s- con't regimen 10.  Hypertension.  HCTZ 12.5 mg daily, Cozaar 50 mg nightly, monitor with increased mobility  2/15- BP controlled- con't regimen- monitor with mobility 11.  GERD.  Continue Protonix 12.  Hyperlipidemia.  Continue Crestor 13.  History of CAD.  Nonobstructive by cardiac catheterization 2011.  Will discuss with neurosurgery when to resume low-dose aspirin 14.  Constipation.  Continue Colace 100 mg twice daily, Dulcolax tablet daily as needed  2/15- LBM 7+ days ago- will give Sorbitol 30cc x1 after therapy- might end up needing enema?  2/16- No results- will give Miralax and prune juice- also 60cc Sorbitol at noon and f/u with soap suds enema at 4pm- if no results, have nursing call me and wil do Mg citrate 15.  OSA.  CPAP  16. Hyponatremia  2/15- will recheck Monday since Na 131  2/16- Na up slightly to 133- con't to monitor- recheck Monday 17. Hypokalemia  2/15- will replete due to K+ of 3.2- 40 mEq x2 and recheck in AM  2/16- K+ 4.3- will recheck Monday 18. Moderate malnutrition- Albumin 2.6-    I spent a total of 41   minutes on total care today- >50% coordination of care- due to  D/w Nursing and pt about bowels as well as IPOC   LOS: 2 days A FACE TO FACE EVALUATION WAS PERFORMED  Ticia Virgo 03/22/2022, 8:24 AM

## 2022-03-22 NOTE — IPOC Note (Signed)
Overall Plan of Care [facility]) Patient Details Name: Tammy Pineda MRN: [id number] DOB: [birth year]  Admitting Diagnosis: Lumbar radiculopathy  Hospital Problems: Principal Problem:   Lumbar radiculopathy     Functional Problem List: Nursing Pain, Bladder, Bowel, Safety, Edema, Endurance, Skin Integrity, Medication Management  PT Balance, Sensory, Endurance, Skin Integrity, Motor, Nutrition, Pain  OT Balance, Endurance, Motor, Pain, Skin Integrity  SLP    TR         Basic ADL's: OT Bathing, Dressing, Toileting     Advanced  ADL's: OT       Transfers: PT Bed Mobility, Bed to Chair, Car, Manufacturing systems engineer, Metallurgist: PT Ambulation, Stairs     Additional Impairments: OT    SLP        TR      Anticipated Outcomes Item Anticipated Outcome  Self Feeding n/a  Swallowing      Basic self-care  mod I  Toileting  mod I   Bathroom Transfers mod I  Bowel/Bladder  continent B/B  Transfers  mod-I using LRAD  Locomotion  supervision using LRAD  Communication     Cognition     Pain  less than 4  Safety/Judgment  remain fall free while in rehab   Therapy Plan: PT Intensity: Minimum of 1-2 x/day ,45 to 90 minutes PT Frequency: 5 out of 7 days PT Duration Estimated Length of Stay: ~10days OT Intensity: Minimum of 1-2 x/day, 45 to 90 minutes OT Frequency: 5 out of 7 days OT Duration/Estimated Length of Stay: ~10 days     Team Interventions: Nursing Interventions Patient/Family Education, Pain Management, Bladder Management, Medication Management, Discharge Planning, Bowel Management, Skin Care/Wound Management, Disease Management/Prevention  PT interventions Ambulation/gait training, Community reintegration, DME/adaptive equipment instruction, Neuromuscular re-education, Psychosocial support, Stair training, UE/LE Strength taining/ROM, Training and development officer, Discharge planning, Functional electrical stimulation, Pain management, Skin  care/wound management, Therapeutic Activities, UE/LE Coordination activities, Disease management/prevention, Functional mobility training, Patient/family education, Splinting/orthotics, Therapeutic Exercise  OT Interventions Balance/vestibular training, Disease mangement/prevention, Neuromuscular re-education, Self Care/advanced ADL retraining, Therapeutic Exercise, DME/adaptive equipment instruction, Skin care/wound managment, Pain management, UE/LE Strength taining/ROM, Community reintegration, Barrister's clerk education, UE/LE Coordination activities, Discharge planning, Functional mobility training, Psychosocial support, Therapeutic Activities  SLP Interventions    TR Interventions    SW/CM Interventions Discharge Planning, Psychosocial Support, Patient/Family Education   Barriers to Discharge MD  Medical stability, Home enviroment access/loayout, Neurogenic bowel and bladder, Wound care, Weight, and Weight bearing restrictions  Nursing Decreased caregiver support, Weight lives with family in 1 level with level entry  PT      OT      SLP      SW       Team Discharge Planning: Destination: PT-Home ,OT- Home , SLP-  Projected Follow-up: PT-Outpatient PT, 24 hour supervision/assistance, OT-  None, SLP-  Projected Equipment Needs: PT-To be determined, OT- To be determined, SLP-  Equipment Details: PT- , OT-  Patient/family involved in discharge planning: PT- Patient, Family member/caregiver,  OT-Patient, Family member/caregiver, SLP-   MD ELOS: ~ 10 days Medical Rehab Prognosis:  Good Assessment: The patient has been admitted for CIR therapies with the diagnosis of Lumbar radiculopathy s/p fusion. The team will be addressing functional mobility, strength, stamina, balance, safety, adaptive techniques and equipment, self-care, bowel and bladder mgt, patient and caregiver education, . Goals have been set at mod I to supervision. Anticipated discharge destination is home.        See  Team Conference  Notes for weekly updates to the plan of care

## 2022-03-23 LAB — GLUCOSE, CAPILLARY
Glucose-Capillary: 147 mg/dL — ABNORMAL HIGH (ref 70–99)
Glucose-Capillary: 117 mg/dL — ABNORMAL HIGH (ref 70–99)
Glucose-Capillary: 124 mg/dL — ABNORMAL HIGH (ref 70–99)
Glucose-Capillary: 105 mg/dL — ABNORMAL HIGH (ref 70–99)

## 2022-03-23 NOTE — Progress Notes (Signed)
PROGRESS NOTE   Subjective/Complaints: Reports left hip pain, discussed kpad and she is agreeable to trying Had multiple BM yesterday after enema.  Feels her strength is improving   ROS:  Pt denies SOB, abd pain, CP, N/V/ (+)C/D, and vision changes, +left hip pain Except for HPI  Objective:   No results found. Recent Labs    03/21/22 0709  WBC 8.9  HGB 10.3*  HCT 29.9*  PLT 248   Recent Labs    03/21/22 0709 03/22/22 0550  NA 131* 133*  K 3.2* 4.3  CL 94* 97*  CO2 30 27  GLUCOSE 169* 152*  BUN 17 21  CREATININE 0.94 0.95  CALCIUM 8.7* 8.9    Intake/Output Summary (Last 24 hours) at 03/23/2022 1345 Last data filed at 03/23/2022 0736 Gross per 24 hour  Intake 593 ml  Output --  Net 593 ml        Physical Exam: Vital Signs Blood pressure (!) 124/47, pulse 65, temperature 98.1 F (36.7 C), temperature source Oral, resp. rate 18, height 5' 5"$  (1.651 m), weight 79.1 kg, SpO2 98 %. Gen: no distress, normal appearing HEENT: oral mucosa pink and moist, NCAT Cardio: Reg rate Chest: normal effort, normal rate of breathing Abd: soft, non-distended Ext: no edema Psych: pleasant, normal affect Neurological: Ox3- Skin:    Comments: Back incision is dressed.  Neurological:     Comments: Patient is alert and oriented x 3.  Follows commands. 4/5 strength throughout. Sensation decreased on left lateral thigh.  Assessment/Plan: 1. Functional deficits which require 3+ hours per day of interdisciplinary therapy in a comprehensive inpatient rehab setting. Physiatrist is providing close team supervision and 24 hour management of active medical problems listed below. Physiatrist and rehab team continue to assess barriers to discharge/monitor patient progress toward functional and medical goals  Care Tool:  Bathing    Body parts bathed by patient: Right arm, Left arm, Chest, Abdomen, Front perineal area, Right  upper leg, Left upper leg, Face   Body parts bathed by helper: Buttocks, Right lower leg, Left lower leg     Bathing assist Assist Level: Moderate Assistance - Patient 50 - 74%     Upper Body Dressing/Undressing Upper body dressing   What is the patient wearing?: Pull over shirt, Orthosis    Upper body assist Assist Level: Moderate Assistance - Patient 50 - 74%    Lower Body Dressing/Undressing Lower body dressing      What is the patient wearing?: Incontinence brief, Pants     Lower body assist Assist for lower body dressing: Maximal Assistance - Patient 25 - 49%     Toileting Toileting    Toileting assist Assist for toileting: Maximal Assistance - Patient 25 - 49%     Transfers Chair/bed transfer  Transfers assist     Chair/bed transfer assist level: Minimal Assistance - Patient > 75% Chair/bed transfer assistive device: Armrests, Programmer, multimedia   Ambulation assist      Assist level: Minimal Assistance - Patient > 75% Assistive device: Walker-rolling Max distance: 17f   Walk 10 feet activity   Assist     Assist level: Minimal Assistance - Patient >  75% Assistive device: Walker-rolling   Walk 50 feet activity   Assist    Assist level: Minimal Assistance - Patient > 75% Assistive device: Walker-rolling    Walk 150 feet activity   Assist Walk 150 feet activity did not occur: Safety/medical concerns         Walk 10 feet on uneven surface  activity   Assist Walk 10 feet on uneven surfaces activity did not occur: Safety/medical concerns         Wheelchair     Assist Is the patient using a wheelchair?: Yes Type of Wheelchair: Manual    Wheelchair assist level: Dependent - Patient 0%      Wheelchair 50 feet with 2 turns activity    Assist        Assist Level: Dependent - Patient 0%   Wheelchair 150 feet activity     Assist      Assist Level: Dependent - Patient 0%   Blood pressure (!)  124/47, pulse 65, temperature 98.1 F (36.7 C), temperature source Oral, resp. rate 18, height 5' 5"$  (1.651 m), weight 79.1 kg, SpO2 98 %.  Medical Problem List and Plan: 1. Functional deficits secondary to lumbar stenosis/radiculopathy.  Status post L3-5 decompression and transforaminal lumbar body fusion 03/14/2022.  Back brace when out of bed.  May remove to shower.             -patient may shower             -ELOS/Goals: 1 week             Admit to CIR  Continue CIR 2.  Antithrombotics: -DVT/anticoagulation: Subcutaneous heparin.  Check vascular study 2/15- DVT R peroneal- since so low, will wait on increasing Lovenox or adding blood thinner per Dr Reatha Armour- will recheck in 1 week and see if propagates- change SQ heparin to Lovenox prevention dose.              -antiplatelet therapy: N/A 3. Pain Management: Robaxin 500 mg every 6 hours as needed muscle spasms, oxycodone/tramadol as needed  2/15- pain controlled when takes oxy 10 mg, but is very painful per pt 4. Mood/Behavior/Sleep: Lexapro 10 mg nightly, Xanax 0.25 mg bedtime as needed             -antipsychotic agents: N/A 5. Neuropsych/cognition: This patient is capable of making decisions on her own behalf. 6. Skin/Wound Care: Routine skin checks 7. Fluids/Electrolytes/Nutrition: Routine in and outs with follow-up chemistries 8.  MRSA PCR screening positive.  Completed course of Bactroban 9.  Diabetes mellitus.  Glucotrol 2.5 mg twice daily.  2/15- CBGs 75 to 207- will give 24 hours and determine if need to upgrade meds  2/16- Doing better- 119-140s- con't regimen 10.  Hypertension.  HCTZ 12.5 mg daily, Cozaar 50 mg nightly, monitor with increased mobility  2/15- BP controlled- con't regimen- monitor with mobility 11.  GERD.  Continue Protonix 12.  Hyperlipidemia.  Continue Crestor 13.  History of CAD.  Nonobstructive by cardiac catheterization 2011.  Will discuss with neurosurgery when to resume low-dose aspirin 14.  Constipation.   Continue Colace 100 mg twice daily, Dulcolax tablet daily as needed  2/15- LBM 7+ days ago- will give Sorbitol 30cc x1 after therapy- might end up needing enema?  2/16- No results- will give Miralax and prune juice- also 60cc Sorbitol at noon and f/u with soap suds enema at 4pm- if no results, have nursing call me and wil do Mg citrate  2/17: had  results with enema 15.  OSA.  continue CPAP  16. Hyponatremia  2/15- will recheck Monday since Na 131  2/16- Na up slightly to 133- con't to monitor- recheck Monday 17. Hypokalemia  2/15- will replete due to K+ of 3.2- 40 mEq x2 and recheck in AM  2/16- K+ 4.3- will recheck Monday 18. Moderate malnutrition- Albumin 2.6-     LOS: 3 days A FACE TO FACE EVALUATION WAS PERFORMED  Martha Clan P Strummer Canipe 03/23/2022, 1:45 PM

## 2022-03-23 NOTE — Plan of Care (Signed)
  Problem: Consults Goal: RH GENERAL PATIENT EDUCATION Description: See Patient Education module for education specifics. Outcome: Progressing   Problem: RH BOWEL ELIMINATION Goal: RH STG MANAGE BOWEL WITH ASSISTANCE Description: STG Manage Bowel with min Assistance. Outcome: Progressing Goal: RH STG MANAGE BOWEL W/MEDICATION W/ASSISTANCE Description: STG Manage Bowel with Medication with min Assistance. Outcome: Progressing   Problem: RH BLADDER ELIMINATION Goal: RH STG MANAGE BLADDER WITH ASSISTANCE Description: STG Manage Bladder With min Assistance Outcome: Progressing   Problem: RH SKIN INTEGRITY Goal: RH STG SKIN FREE OF INFECTION/BREAKDOWN Description: Incisions will be free of infection and breakdown with min assist Outcome: Progressing Goal: RH STG ABLE TO PERFORM INCISION/WOUND CARE W/ASSISTANCE Description: STG Able To Perform Incision/Wound Care With min Assistance. Outcome: Progressing   Problem: RH SAFETY Goal: RH STG ADHERE TO SAFETY PRECAUTIONS W/ASSISTANCE/DEVICE Description: STG Adhere to Safety Precautions With cueing Assistance/Device. Outcome: Progressing   Problem: RH PAIN MANAGEMENT Goal: RH STG PAIN MANAGED AT OR BELOW PT'S PAIN GOAL Description: Pain will be managed 4 out of 10 on pain scale with PRN medications with min assist Outcome: Progressing   Problem: RH KNOWLEDGE DEFICIT GENERAL Goal: RH STG INCREASE KNOWLEDGE OF SELF CARE AFTER HOSPITALIZATION Description: Patient/caregiver will be able to manage medications, constipation, and back brace independently from nursing education and nursing handouts.  Outcome: Progressing

## 2022-03-24 LAB — GLUCOSE, CAPILLARY
Glucose-Capillary: 139 mg/dL — ABNORMAL HIGH (ref 70–99)
Glucose-Capillary: 132 mg/dL — ABNORMAL HIGH (ref 70–99)
Glucose-Capillary: 139 mg/dL — ABNORMAL HIGH (ref 70–99)
Glucose-Capillary: 109 mg/dL — ABNORMAL HIGH (ref 70–99)

## 2022-03-24 NOTE — Progress Notes (Signed)
Physical Therapy Session Note  Patient Details  Name: Tammy Pineda MRN: [id number] Date of Birth: August 07, 1945  Today's Date: 03/24/2022 PT Individual Time: 0800-0845 PT Individual Time Calculation (min): 45 min   Short Term Goals: Week 1:  PT Short Term Goal 1 (Week 1): = to LTGs based on ELOS  Skilled Therapeutic Interventions/Progress Updates:   First session:  Pt presents supine in bed w/ CPAP.  Pt agreeable to therapy.  TED hose donned total A.  Pants threaded over feet and pt pulled up to bottom.  Pt performed log roll w/ CGA and scooted to EOB.  Pt given TLSO and donned Independently.  PT donned shoes w/ max A.  Pt transferred sit to stand from slightly elevated bed and min A, verbal cues for hand placement and breathing.  Pt required CGA for steadying to pull pants over hips.  Pt amb w/ RW and Close supervision/CGA into BR w/ min A for stand <> sit onto toilet.  Pt performed all pericare.  Pt amb to sink for hand washing and for brushing teeth.  Pt amb x 150' w/ RW and CGA, verbal cues for posture, visual scanning.  Pt returned to room and transferred into recliner w/ min A.  Seat alarm on and all needs in reach.  Second session:Pt presents sitting in recliner, reclined back w/ CPAP on.  Pt agreeable to therapy.  Pt transferred sit to stand w/ min A.  Pt amb to dayroom w/ Close supervision and verbal cues for posture and increased BOS.  Pt performed Nu-step at Level 3 x 10' or improved strength and endurance.  Pt performed standing marching and knee flexion 2 x 10-12.  Pt states need for BR.  Pt amb 150' to room and into BR.  CGA for steadying for all pericare.  Pt performed handwashing.  Pt returned to recliner w/ seat alarm on and all needs in reach.     Therapy Documentation Precautions:  Precautions Precautions: Back, Other (comment) Precaution Comments: spine precautions, lumbar corset when OOB Restrictions Weight Bearing Restrictions: No General:   Vital Signs: Therapy  Vitals Temp: 97.9 F (36.6 C) Temp Source: Oral Pulse Rate: 62 Resp: 18 BP: (!) 117/44 Patient Position (if appropriate): Lying Oxygen Therapy SpO2: 97 % O2 Device: Room Air Pain:0/10 initially Pain Assessment Pain Scale: 0-10 Pain Score: 0-No pain    Therapy/Group: Individual Therapy  Ladoris Gene 03/24/2022, 8:56 AM

## 2022-03-24 NOTE — Progress Notes (Signed)
Occupational Therapy Session Note  Patient Details  Name: Tammy Pineda MRN: [id number] Date of Birth: 1945/09/24  Today's Date: 03/24/2022 OT Individual Time: [id number] OT Individual Time Calculation (min): 26 min    Short Term Goals: Week 1:  OT Short Term Goal 1 (Week 1): LTG=STG mod I  Skilled Therapeutic Interventions/Progress Updates:  Pt greeted seated in recliner, pt agreeable to OT intervention. Session focus on BADL reeducation, functional mobility, dynamic standing balance and decreasing overall caregiver burden.               Pt able to state 3/3 back precautions with brace donned, pt completed ambulatory toilet transfer with RW and CGA. Pt with continent urine void with pt needing assist to complete anterior pericare without bending/twisting.  Pt transported to gym for time mgmt. Focused on dynamic standing balance with an emphasis on unilateral support from RW and dynamic reaching for ADLs. Pt instructed to reach with RUE to mirror to match cards with unilateral support, pt completed task with overall CGA. Overall pt completed x9 sit>stands from w/c with an emphasis on hand placement and anterior weight shift. Pt completed stands with CGA.       Ended session with pt supine in bed with all needs within reach and bed alarm activated.                    Therapy Documentation Precautions:  Precautions Precautions: Back, Other (comment) Precaution Comments: spine precautions, lumbar corset when OOB Restrictions Weight Bearing Restrictions: No  Pain: unrated back pain, rest breaks, repositioning provided as needed.     Therapy/Group: Individual Therapy  Precious Haws 03/24/2022, 3:47 PM

## 2022-03-24 NOTE — Progress Notes (Signed)
PROGRESS NOTE   Subjective/Complaints: Seen walking with Merry Proud PT Continues to have left sided pain but felt that heating pad helped while it was on Has no other complaints   ROS:  Pt denies SOB, abd pain, CP, N/V/ (+)C/D, and vision changes, +left hip/leg pain Except for HPI  Objective:   No results found. No results for input(s): "WBC", "HGB", "HCT", "PLT" in the last 72 hours.  Recent Labs    03/22/22 0550  NA 133*  K 4.3  CL 97*  CO2 27  GLUCOSE 152*  BUN 21  CREATININE 0.95  CALCIUM 8.9    Intake/Output Summary (Last 24 hours) at 03/24/2022 1614 Last data filed at 03/24/2022 0200 Gross per 24 hour  Intake 360 ml  Output --  Net 360 ml        Physical Exam: Vital Signs Blood pressure (!) 112/44, pulse 62, temperature 97.8 F (36.6 C), temperature source Oral, resp. rate 18, height 5' 5"$  (1.651 m), weight 79.1 kg, SpO2 96 %. Gen: no distress, normal appearing HEENT: oral mucosa pink and moist, NCAT Cardio: Reg rate Chest: normal effort, normal rate of breathing Abd: soft, non-distended Ext: no edema Psych: pleasant, normal affect Neurological: Ox3- Skin:    Comments: Back incision is dressed.  Neurological:     Comments: Patient is alert and oriented x 3.  Follows commands. 4/5 strength throughout. Sensation decreased on left lateral thigh.  Assessment/Plan: 1. Functional deficits which require 3+ hours per day of interdisciplinary therapy in a comprehensive inpatient rehab setting. Physiatrist is providing close team supervision and 24 hour management of active medical problems listed below. Physiatrist and rehab team continue to assess barriers to discharge/monitor patient progress toward functional and medical goals  Care Tool:  Bathing    Body parts bathed by patient: Right arm, Left arm, Chest, Abdomen, Front perineal area, Buttocks, Right upper leg, Left upper leg, Right lower leg, Left  lower leg, Face   Body parts bathed by helper: Buttocks, Right lower leg, Left lower leg     Bathing assist Assist Level: Contact Guard/Touching assist     Upper Body Dressing/Undressing Upper body dressing   What is the patient wearing?: Pull over shirt, Orthosis    Upper body assist Assist Level: Supervision/Verbal cueing    Lower Body Dressing/Undressing Lower body dressing      What is the patient wearing?: Underwear/pull up, Pants     Lower body assist Assist for lower body dressing: Minimal Assistance - Patient > 75%     Toileting Toileting    Toileting assist Assist for toileting: Minimal Assistance - Patient > 75%     Transfers Chair/bed transfer  Transfers assist     Chair/bed transfer assist level: Contact Guard/Touching assist Chair/bed transfer assistive device: Armrests, Programmer, multimedia   Ambulation assist      Assist level: Contact Guard/Touching assist Assistive device: Walker-rolling Max distance: 150   Walk 10 feet activity   Assist     Assist level: Contact Guard/Touching assist Assistive device: Walker-rolling   Walk 50 feet activity   Assist    Assist level: Contact Guard/Touching assist Assistive device: Walker-rolling    Walk  150 feet activity   Assist Walk 150 feet activity did not occur: Safety/medical concerns  Assist level: Contact Guard/Touching assist Assistive device: Walker-rolling    Walk 10 feet on uneven surface  activity   Assist Walk 10 feet on uneven surfaces activity did not occur: Safety/medical concerns         Wheelchair     Assist Is the patient using a wheelchair?: Yes Type of Wheelchair: Manual    Wheelchair assist level: Dependent - Patient 0%      Wheelchair 50 feet with 2 turns activity    Assist        Assist Level: Dependent - Patient 0%   Wheelchair 150 feet activity     Assist      Assist Level: Dependent - Patient 0%   Blood  pressure (!) 112/44, pulse 62, temperature 97.8 F (36.6 C), temperature source Oral, resp. rate 18, height 5' 5"$  (1.651 m), weight 79.1 kg, SpO2 96 %.  Medical Problem List and Plan: 1. Functional deficits secondary to lumbar stenosis/radiculopathy.  Status post L3-5 decompression and transforaminal lumbar body fusion 03/14/2022.  Back brace when out of bed.  May remove to shower.             -patient may shower             -ELOS/Goals: 1 week             Continue CIR 2.  Antithrombotics: -DVT/anticoagulation: Subcutaneous heparin.  Check vascular study 2/15- DVT R peroneal- since so low, will wait on increasing Lovenox or adding blood thinner per Dr Reatha Armour- will recheck in 1 week and see if propagates- change SQ heparin to Lovenox prevention dose.              -antiplatelet therapy: N/A 3. Pain Management: continue Robaxin 500 mg every 6 hours as needed muscle spasms, oxycodone/tramadol as needed. Kpad ordered.   2/15- pain controlled when takes oxy 10 mg, but is very painful per pt 4. Anxiety: continue Lexapro 10 mg nightly, Xanax 0.25 mg bedtime as needed             -antipsychotic agents: N/A 5. Neuropsych/cognition: This patient is capable of making decisions on her own behalf. 6. Skin/Wound Care: Routine skin checks 7. Fluids/Electrolytes/Nutrition: Routine in and outs with follow-up chemistries 8.  MRSA PCR screening positive.  Completed course of Bactroban 9.  Diabetes mellitus.  Continue Glucotrol 2.5 mg twice daily.  2/15- CBGs 75 to 207- will give 24 hours and determine if need to upgrade meds  2/16- Doing better- 119-140s- con't regimen 10.  Hypertension.  HCTZ 12.5 mg daily, Cozaar 50 mg nightly, monitor with increased mobility  2/15- BP controlled- con't regimen- monitor with mobility 11.  GERD.  Continue Protonix 12.  Hyperlipidemia.  Continue Crestor 13.  History of CAD.  Nonobstructive by cardiac catheterization 2011.  Will discuss with neurosurgery when to resume low-dose  aspirin 14.  Constipation.  Continue Colace 100 mg twice daily, Dulcolax tablet daily as needed  2/15- LBM 7+ days ago- will give Sorbitol 30cc x1 after therapy- might end up needing enema?  2/16- No results- will give Miralax and prune juice- also 60cc Sorbitol at noon and f/u with soap suds enema at 4pm- if no results, have nursing call me and wil do Mg citrate  2/17: had results with enema 15.  OSA.  continue CPAP  16. Hyponatremia  2/15- will recheck Monday since Na 131  2/16- Na up slightly  to 133- con't to monitor- recheck Monday 17. Hypokalemia  2/15- will replete due to K+ of 3.2- 40 mEq x2 and recheck in AM  2/16- K+ 4.3- will recheck Monday 18. Moderate malnutrition- Albumin 2.6-     LOS: 4 days A FACE TO FACE EVALUATION WAS PERFORMED  Clide Deutscher Tammy Pineda 03/24/2022, 4:14 PM

## 2022-03-24 NOTE — Progress Notes (Signed)
Occupational Therapy Session Note  Patient Details  Name: Tammy Pineda MRN: [id number] Date of Birth: [birth year]  Today's Date: 03/24/2022 OT Individual Time: 0905-1005 OT Individual Time Calculation (min): 60 min    Short Term Goals: Week 1:  OT Short Term Goal 1 (Week 1): LTG=STG mod I  Skilled Therapeutic Interventions/Progress Updates:  Pt received sitting in recliner for skilled OT session with focus on BADL retraining. Pt agreeable to interventions, demonstrating overall pleasant mood. Pt reported 6-7/10 pain, stating "it's in my legs", pt medicated during session. OT offering intermediate rest breaks and positioning suggestions throughout session to address pain/fatigue and maximize participation/safety in session.   Pt performs all functional transfers with Min A + RW due to fatigue, ambulating into/out off bathroom for shower with CGA + RW.   Pt completes full-body shower with setup A for UB/LB, pt issued long-handled sponge, requiring CGA+grab bar for standing peri-care.  Pt re-educated on use of reacher for LB dressing, completing with Min A for due to decreased frustration tolerance. Dependent for TEDs. Pt doffs shoes with supervision, requiring closer to Max A for donning.   Pt remained sitting in recliner with all immediate needs met at end of session. Pt continues to be appropriate for skilled OT intervention to promote further functional independence.   Therapy Documentation Precautions:  Precautions Precautions: Back, Other (comment) Precaution Comments: spine precautions, lumbar corset when OOB Restrictions Weight Bearing Restrictions: No   Therapy/Group: Individual Therapy  Maudie Mercury, OTR/L, MSOT  03/24/2022, 6:01 AM

## 2022-03-25 DIAGNOSIS — R300 Dysuria: Secondary | ICD-10-CM

## 2022-03-25 DIAGNOSIS — E871 Hypo-osmolality and hyponatremia: Secondary | ICD-10-CM

## 2022-03-25 DIAGNOSIS — E669 Obesity, unspecified: Secondary | ICD-10-CM

## 2022-03-25 DIAGNOSIS — I10 Essential (primary) hypertension: Secondary | ICD-10-CM

## 2022-03-25 DIAGNOSIS — E1169 Type 2 diabetes mellitus with other specified complication: Secondary | ICD-10-CM

## 2022-03-25 LAB — URINALYSIS, W/ REFLEX TO CULTURE (INFECTION SUSPECTED)
Bilirubin Urine: NEGATIVE
Glucose, UA: NEGATIVE mg/dL
Ketones, ur: NEGATIVE mg/dL
Nitrite: POSITIVE — AB
Protein, ur: NEGATIVE mg/dL
Specific Gravity, Urine: 1.015 (ref 1.005–1.030)
WBC, UA: >50 WBC/hpf (ref 0–5)
pH: 6 (ref 5.0–8.0)

## 2022-03-25 LAB — GLUCOSE, CAPILLARY
Glucose-Capillary: 129 mg/dL — ABNORMAL HIGH (ref 70–99)
Glucose-Capillary: 125 mg/dL — ABNORMAL HIGH (ref 70–99)
Glucose-Capillary: 111 mg/dL — ABNORMAL HIGH (ref 70–99)
Glucose-Capillary: 159 mg/dL — ABNORMAL HIGH (ref 70–99)

## 2022-03-25 LAB — CBC WITH DIFFERENTIAL/PLATELET
Abs Immature Granulocytes: 0.13 10*3/uL — ABNORMAL HIGH (ref 0.00–0.07)
Basophils Absolute: 0.1 10*3/uL (ref 0.0–0.1)
Basophils Relative: 1 %
Eosinophils Absolute: 0.3 10*3/uL (ref 0.0–0.5)
Eosinophils Relative: 3 %
HCT: 31.1 % — ABNORMAL LOW (ref 36.0–46.0)
Hemoglobin: 10.4 g/dL — ABNORMAL LOW (ref 12.0–15.0)
Immature Granulocytes: 1 %
Lymphocytes Relative: 28 %
Lymphs Abs: 2.5 10*3/uL (ref 0.7–4.0)
MCH: 29.5 pg (ref 26.0–34.0)
MCHC: 33.4 g/dL (ref 30.0–36.0)
MCV: 88.4 fL (ref 80.0–100.0)
Monocytes Absolute: 0.6 10*3/uL (ref 0.1–1.0)
Monocytes Relative: 7 %
Neutro Abs: 5.6 10*3/uL (ref 1.7–7.7)
Neutrophils Relative %: 60 %
Platelets: 294 10*3/uL (ref 150–400)
RBC: 3.52 MIL/uL — ABNORMAL LOW (ref 3.87–5.11)
RDW: 13.1 % (ref 11.5–15.5)
WBC: 9.1 10*3/uL (ref 4.0–10.5)
nRBC: 0 % (ref 0.0–0.2)

## 2022-03-25 LAB — COMPREHENSIVE METABOLIC PANEL
ALT: 29 U/L (ref 0–44)
AST: 24 U/L (ref 15–41)
Albumin: 2.8 g/dL — ABNORMAL LOW (ref 3.5–5.0)
Alkaline Phosphatase: 279 U/L — ABNORMAL HIGH (ref 38–126)
Anion gap: 14 (ref 5–15)
BUN: 19 mg/dL (ref 8–23)
CO2: 23 mmol/L (ref 22–32)
Calcium: 9.1 mg/dL (ref 8.9–10.3)
Chloride: 97 mmol/L — ABNORMAL LOW (ref 98–111)
Creatinine, Ser: 0.96 mg/dL (ref 0.44–1.00)
GFR, Estimated: >60 mL/min (ref >60)
Glucose, Bld: 129 mg/dL — ABNORMAL HIGH (ref 70–99)
Potassium: 4.1 mmol/L (ref 3.5–5.1)
Sodium: 134 mmol/L — ABNORMAL LOW (ref 135–145)
Total Bilirubin: 0.5 mg/dL (ref 0.3–1.2)
Total Protein: 6.6 g/dL (ref 6.5–8.1)

## 2022-03-25 MED ORDER — CEPHALEXIN 250 MG PO CAPS
500.0000 mg | ORAL_CAPSULE | Freq: Two times a day (BID) | ORAL | Status: DC
  Administered 2022-03-25 – 2022-03-28 (×7): 500 mg via ORAL
  Filled 2022-03-25 (×7): qty 2

## 2022-03-25 NOTE — Progress Notes (Signed)
PROGRESS NOTE   Subjective/Complaints: Had some back pain last night when rolling over and laying on side. C/o of dysuria, urinary urgency.   ROS: Patient denies fever, rash, sore throat, blurred vision, dizziness, nausea, vomiting, diarrhea, cough, shortness of breath or chest pain,  headache, or mood change.   Objective:   No results found. Recent Labs    03/25/22 0550  WBC 9.1  HGB 10.4*  HCT 31.1*  PLT 294    Recent Labs    03/25/22 0550  NA 134*  K 4.1  CL 97*  CO2 23  GLUCOSE 129*  BUN 19  CREATININE 0.96  CALCIUM 9.1    Intake/Output Summary (Last 24 hours) at 03/25/2022 [id number] Last data filed at 03/25/2022 0230 Gross per 24 hour  Intake 360 ml  Output --  Net 360 ml        Physical Exam: Vital Signs Blood pressure (!) 114/40, pulse (!) 59, temperature 98.3 F (36.8 C), temperature source Oral, resp. rate 16, height 5' 5"$  (1.651 m), weight 79.1 kg, SpO2 100 %. Constitutional: No distress . Vital signs reviewed. HEENT: NCAT, EOMI, oral membranes moist Neck: supple Cardiovascular: RRR without murmur. No JVD    Respiratory/Chest: CTA Bilaterally without wheezes or rales. Normal effort    GI/Abdomen: BS +, non-tender, non-distended Ext: no clubbing, cyanosis, or edema Psych: pleasant and cooperative  Neurological: Ox3- Skin:    Comments: back incision cdi with honeycomb dressing in place  Neurological:     Comments: Patient is alert and oriented x 3.  Follows commands. 4/5 strength throughout. Sensation remains decreased on left lateral thigh.  Assessment/Plan: 1. Functional deficits which require 3+ hours per day of interdisciplinary therapy in a comprehensive inpatient rehab setting. Physiatrist is providing close team supervision and 24 hour management of active medical problems listed below. Physiatrist and rehab team continue to assess barriers to discharge/monitor patient progress toward  functional and medical goals  Care Tool:  Bathing    Body parts bathed by patient: Right arm, Left arm, Chest, Abdomen, Front perineal area, Buttocks, Right upper leg, Left upper leg, Right lower leg, Left lower leg, Face   Body parts bathed by helper: Buttocks, Right lower leg, Left lower leg     Bathing assist Assist Level: Contact Guard/Touching assist     Upper Body Dressing/Undressing Upper body dressing   What is the patient wearing?: Pull over shirt, Orthosis    Upper body assist Assist Level: Supervision/Verbal cueing    Lower Body Dressing/Undressing Lower body dressing      What is the patient wearing?: Underwear/pull up, Pants     Lower body assist Assist for lower body dressing: Minimal Assistance - Patient > 75%     Toileting Toileting    Toileting assist Assist for toileting: Minimal Assistance - Patient > 75%     Transfers Chair/bed transfer  Transfers assist     Chair/bed transfer assist level: Contact Guard/Touching assist Chair/bed transfer assistive device: Armrests, Programmer, multimedia   Ambulation assist      Assist level: Contact Guard/Touching assist Assistive device: Walker-rolling Max distance: 150   Walk 10 feet activity   Assist  Assist level: Contact Guard/Touching assist Assistive device: Walker-rolling   Walk 50 feet activity   Assist    Assist level: Contact Guard/Touching assist Assistive device: Walker-rolling    Walk 150 feet activity   Assist Walk 150 feet activity did not occur: Safety/medical concerns  Assist level: Contact Guard/Touching assist Assistive device: Walker-rolling    Walk 10 feet on uneven surface  activity   Assist Walk 10 feet on uneven surfaces activity did not occur: Safety/medical concerns         Wheelchair     Assist Is the patient using a wheelchair?: Yes Type of Wheelchair: Manual    Wheelchair assist level: Dependent - Patient 0%       Wheelchair 50 feet with 2 turns activity    Assist        Assist Level: Dependent - Patient 0%   Wheelchair 150 feet activity     Assist      Assist Level: Dependent - Patient 0%   Blood pressure (!) 114/40, pulse (!) 59, temperature 98.3 F (36.8 C), temperature source Oral, resp. rate 16, height 5' 5"$  (1.651 m), weight 79.1 kg, SpO2 100 %.  Medical Problem List and Plan: 1. Functional deficits secondary to lumbar stenosis/radiculopathy.  Status post L3-5 decompression and transforaminal lumbar body fusion 03/14/2022.  Back brace when out of bed.  May remove to shower.             -patient may shower             -ELOS/Goals: 1 week             -Continue CIR therapies including PT, OT  2.  Antithrombotics: -DVT/anticoagulation: Subcutaneous heparin.  Check vascular study  - DVT R peroneal-   Lovenox 9m qd after speaking with Dr Dawley-    -2/19 recheck doppler this week ~ 2/22             -antiplatelet therapy: N/A 3. Pain Management: continue Robaxin 500 mg every 6 hours as needed muscle spasms, oxycodone/tramadol as needed. Kpad ordered.   2/15- pain controlled when takes oxy 10 mg, but is very painful per pt 4. Anxiety: continue Lexapro 10 mg nightly, Xanax 0.25 mg bedtime as needed             -antipsychotic agents: N/A 5. Neuropsych/cognition: This patient is capable of making decisions on her own behalf. 6. Skin/Wound Care: Routine skin checks 7. Fluids/Electrolytes/Nutrition: Routine in and outs with follow-up chemistries 8.  MRSA PCR screening positive.  Completed course of Bactroban 9.  Diabetes mellitus.  Continue Glucotrol 2.5 mg twice daily.  2/15- CBGs 75 to 207- will give 24 hours and determine if need to upgrade meds  2/19 sugars under good control 10.  Hypertension.  HCTZ 12.5 mg daily, Cozaar 50 mg nightly, monitor with increased mobility  2/19- BP controlled- con't regimen-   11.  GERD.  Continue Protonix 12.  Hyperlipidemia.  Continue  Crestor 13.  History of CAD.  Nonobstructive by cardiac catheterization 2011.  Will discuss with neurosurgery when to resume low-dose aspirin 14.  Constipation.  Continue Colace 100 mg twice daily, Dulcolax tablet daily as needed  2/15- LBM 7+ days ago- will give Sorbitol 30cc x1 after therapy- might end up needing enema?  2/16- No results- will give Miralax and prune juice- also 60cc Sorbitol at noon and f/u with soap suds enema at 4pm- if no results, have nursing call me and wil do Mg citrate  -LBM 2/18  15.  OSA.  continue CPAP  16. Hyponatremia  2/15- will recheck Monday since Na 131  2/19- Na+ trending up, now 134 today 17. Hypokalemia  2/15- will replete due to K+ of 3.2- 40 mEq x2 and recheck in AM  2/19- K+ 4.1, not currently on supplement 18. Moderate malnutrition- Albumin 2.6-  19. Urinary urgency/dysuria 2/19  -check UA/urine culture    LOS: 5 days A FACE TO FACE EVALUATION WAS PERFORMED  Meredith Staggers 03/25/2022, 9:23 AM

## 2022-03-25 NOTE — Progress Notes (Signed)
Occupational Therapy Session Note  Patient Details  Name: Tammy Pineda MRN: [id number] Date of Birth: 1945-12-08  Today's Date: 03/25/2022 OT Individual Time: 1045-1200 OT Individual Time Calculation (min): 75 min    Short Term Goals: Week 1:  OT Short Term Goal 1 (Week 1): LTG=STG mod I  Skilled Therapeutic Interventions/Progress Updates:   Pt seen for later AM session with pt up and dressed ready for session upon OT arrival. Pt reported mild 2/10 back and LE pain. Pt deferring shower until tomorrow. Did request for oral care. Moved from recliner with LSO brace in place and RW to stand sink side with CGA. Stood up to 6 minutes as pt also wet hair which needed multiple trials of wet washcloths then combing. Seated then for rest in w/c. OT transported pt via w/c to demo apt space for energy conservation. OT set up shower frame to mirror home set up with shower chair set inside. Pt able to amb and perform forward step over turn and sit then stand and step out all with CGA. Amb with RW to regular bed with bed rail and interested in rail reporting her husband and son may be able to fabricate one out of PVC material. Sat of and on with CGA/close S. Pt issued RW bag for light item transport and used reacher to retrieve light items from pantry shelves and transport to counter with CGA. Pt stood and moved in amb overall 6 minutes then rested w/c level. Pt requested toileting and to change from pull up to panties with pad once in room. Amb from doorway with CGA to toilet with grab bar, removed lower body garments with reacher as well as tennis shoes with elastic laces. Pt able to don lower body garments with increased time with reacher and OT donned slipper socks. Performed peri hygiene after voiding (see flowsheet for data) with dist S. Stood with close S for hand washing at sink then requested back to bed for rest. Close S to transfer to bed with min A for LB mngt to supine. Left pt with no increased pain  reported, bed alarm set, needs and nurse call button in reach.    Therapy Documentation Precautions:  Precautions Precautions: Back, Other (comment) Precaution Comments: spine precautions, lumbar corset when OOB Restrictions Weight Bearing Restrictions: No    Therapy/Group: Individual Therapy  Barnabas Lister 03/25/2022, 12:02 PM

## 2022-03-25 NOTE — Progress Notes (Signed)
Physical Therapy Session Note  Patient Details  Name: Tammy Pineda MRN: [id number] Date of Birth: 1945/05/23  Today's Date: 03/25/2022 PT Individual Time: 0830-0915 PT Individual Time Calculation (min): 45 min   Short Term Goals: Week 1:  PT Short Term Goal 1 (Week 1): = to LTGs based on ELOS  Skilled Therapeutic Interventions/Progress Updates:    Chart reviewed and pt agreeable to therapy. Pt received semi-reclined in bed with 6/10 c/o pain in LLE. Session focused on functional transfers, amb endurance, and stair practice to promote safe home access. Pt initiated session with transfer to EOB using CGA + bed features. Pt then completed sit to stand with CGA+ RW for lower body dressing. Pt then amb 373f around therapy gyms with CGA + RW. After seated rest break, pt participated in block practice for step up/downs that included explanation of step progression for safety and VC for practice. Pt required CGA + 2 rails for consistent step up/down practice. Pt then returned to room with [id number] using CGA + RW.. Marland Kitchent end of session, pt was left seated in recliner with alarm engaged, nurse call bell and all needs in reach.     Therapy Documentation Precautions:  Precautions Precautions: Back, Other (comment) Precaution Comments: spine precautions, lumbar corset when OOB Restrictions Weight Bearing Restrictions: No     Therapy/Group: Individual Therapy  KiMarquette OldPT, DPT 03/25/2022, 9:32 AM

## 2022-03-25 NOTE — Progress Notes (Signed)
Physical Therapy Session Note  Patient Details  Name: Tammy Pineda MRN: [id number] Date of Birth: Oct 14, 1945  Today's Date: 03/25/2022 PT Individual Time: 1455-1536 PT Individual Time Calculation (min): 41 min   Short Term Goals: Week 1:  PT Short Term Goal 1 (Week 1): = to LTGs based on ELOS  Skilled Therapeutic Interventions/Progress Updates:    Pt received in recliner and agreeable to therapy.  Pt reports 6/10 pain, controlled on medication at this time. Sit to stand with CGA overall this session, with occ cues for anterior weight shift. Pt ambulated to/from therapy gym with CGA and RW. Pt participated in cornhole while standing on airex pad for dynamic balance and activity tolerance. Pt reached across midline with both hands and discussed not twisting to reach. Pt then participated in cone taps 2 x 20 for balance and coordination. Pt returned to room and used bathroom with supervision for continent bladder void. Pt returned to bed with supervision and was left with all needs in reach and alarm active.   Therapy Documentation Precautions:  Precautions Precautions: Back, Other (comment) Precaution Comments: spine precautions, lumbar corset when OOB Restrictions Weight Bearing Restrictions: No General:       Therapy/Group: Individual Therapy  Mickel Fuchs 03/25/2022, 3:18 PM

## 2022-03-25 NOTE — Progress Notes (Signed)
Occupational Therapy Session Note  Patient Details  Name: Tammy Pineda MRN: [id number] Date of Birth: Jul 03, 1945  Today's Date: 03/25/2022 OT Individual Time: 1300-1340 OT Individual Time Calculation (min): 40 min    Short Term Goals: Week 1:  OT Short Term Goal 1 (Week 1): LTG=STG mod I  Skilled Therapeutic Interventions/Progress Updates:    Pt resting in bed upon arrival. OT intervention with focus on bed mobility, functional amb with RW, sit<>stand, shower transfers, and safety awareness to increase independence with BADLs. Supine>sit EOB with supervision using bed rails and HOB elevated slightly. Pt will be getting rails to use in bed at home. Bed at home is adjustable. Pt dons LSO independently. Sit<>stand and amb with RW into bathroom for shower transfer with CGA/supervision. Sit<>stand from shower chair with supervision using guard rails. Pt has guard rails at home. Pt returned to room and sat in recliner. Pt's husband joined session and provided assistance/supervision for pt to walk into bathroom and into hallway. Pt's husband provides the appropriate level of supervision/assistance.  Pt sat in recliner with all needs within reach and husband present.   Therapy Documentation Precautions:  Precautions Precautions: Back, Other (comment) Precaution Comments: spine precautions, lumbar corset when OOB Restrictions Weight Bearing Restrictions: No Pain: Pain Assessment Pain Scale: 0-10 Pain Score: 6  Pain Type: Acute pain Pain Location: Leg Pain Orientation: Left Pain Descriptors / Indicators: Aching;Sore Pain Frequency: Intermittent Pain Onset: Gradual Patients Stated Pain Goal: 2 Pain Intervention(s): Meds admin prior to therapy  Therapy/Group: Individual Therapy  Leroy Libman 03/25/2022, 1:47 PM

## 2022-03-26 DIAGNOSIS — N3000 Acute cystitis without hematuria: Secondary | ICD-10-CM

## 2022-03-26 DIAGNOSIS — E119 Type 2 diabetes mellitus without complications: Secondary | ICD-10-CM

## 2022-03-26 LAB — GLUCOSE, CAPILLARY
Glucose-Capillary: 149 mg/dL — ABNORMAL HIGH (ref 70–99)
Glucose-Capillary: 132 mg/dL — ABNORMAL HIGH (ref 70–99)
Glucose-Capillary: 100 mg/dL — ABNORMAL HIGH (ref 70–99)
Glucose-Capillary: 194 mg/dL — ABNORMAL HIGH (ref 70–99)

## 2022-03-26 MED ORDER — OXYCODONE HCL 5 MG PO TABS
10.0000 mg | ORAL_TABLET | ORAL | Status: DC | PRN
  Administered 2022-03-27 – 2022-03-29 (×5): 10 mg via ORAL
  Filled 2022-03-26 (×8): qty 2

## 2022-03-26 NOTE — Progress Notes (Signed)
Patient ID: Tammy Pineda, female   DOB: 1945/11/16, 77 y.o.   MRN: [id number]  SW met with pt in room to provide updates from team conference, and d/c date 2/23. If outpatient, prefers [facility]. She reports she will update her husband, as he is not feeling well and he drove himself to the [place]. Mentioned being concerned about him due to various health issues. She is aware SW will follow-up with updates.   Loralee Pacas, MSW, [place] Office: [phone] Cell: [phone] Fax: [phone]

## 2022-03-26 NOTE — Progress Notes (Signed)
Occupational Therapy Session Note  Patient Details  Name: Tammy Pineda MRN: [id number] Date of Birth: 30-Apr-1945  Today's Date: 03/26/2022 OT Individual Time: 1330-1426 OT Individual Time Calculation (min): 56 min    Short Term Goals: Week 1:  OT Short Term Goal 1 (Week 1): LTG=STG mod I  Skilled Therapeutic Interventions/Progress Updates:    OT intervention with focus on bed mobility, functional amb with RW, standing balance, activity tolerance, and safety awareness to increase independence with BADLs. Supine>sit EOB with supervision using bed rails. Amb with RW to day room with supervision. 7 mins NuStep level 4. Standing activity on AirEx for game of corn hole. Pt used reacher to retrieve all items from floor. Pt adhered to all back precautions throuhgut session. Pt requested to use toilet upon return to room. All toleting tasks with supervision. Pt returned to EOB. Sit>supine with min A for BLE mgmt. Pt remained in bed with all needs within reach and bed alarm activated.   Therapy Documentation Precautions:  Precautions Precautions: Back, Other (comment) Precaution Comments: spine precautions, lumbar corset when OOB Restrictions Weight Bearing Restrictions: No  Pain: Pt reports pain is ok since she received meds prior to therapy   Therapy/Group: Individual Therapy  Leroy Libman 03/26/2022, 2:50 PM

## 2022-03-26 NOTE — Progress Notes (Signed)
Physical Therapy Session Note  Patient Details  Name: Tammy Pineda MRN: [id number] Date of Birth: 28-Jun-1945  Today's Date: 03/26/2022 PT Individual Time: 1120-1200 PT Individual Time Calculation (min): 40 min   Short Term Goals: Week 1:  PT Short Term Goal 1 (Week 1): = to LTGs based on ELOS  Skilled Therapeutic Interventions/Progress Updates:    Chart reviewed and pt agreeable to therapy. Pt received seated in recliner with c/o pain in back that was not quantified. Session focused on ambulation endurance and independence, stair navigation, and balance to promote safe home access. Pt initiated session with amb of 244f to therapy gym with supervision + RW. Pt then navigated 16 small steps  using CGA + B rails. Pt then amb 2049fto return to room with supervision + RW. Of note, pt required rest breaks 2/2 fatigue and pain. In room, pt amb 2x1053fith CGA + HHA. Pt then completed series of balance exercises including neutral stance and narrow stance with CGA . At end of session, pt was left seated in recliner with alarm engaged, nurse call bell and all needs in reach.     Therapy Documentation Precautions:  Precautions Precautions: Back, Other (comment) Precaution Comments: spine precautions, lumbar corset when OOB Restrictions Weight Bearing Restrictions: No     Therapy/Group: Individual Therapy  KirMarquette OldT, DPT 03/26/2022, 12:39 PM

## 2022-03-26 NOTE — Discharge Summary (Signed)
Physician Discharge Summary  Patient ID: [name] MRN: [id number] DOB/AGE: Mar 13, 1945 77 y.o.  Admit date: 03/20/2022 Discharge date: 03/29/2022  Discharge Diagnoses:  Principal Problem:   Lumbar radiculopathy Active Problems:   Pain in joint of right shoulder DVT prophylaxis/peroneal vein DVT Pain management Mood stabilization MRSA PCR screening positive Diabetes mellitus Hypertension GERD History of CAD Constipation OSA Obesity UTI  Discharged Condition: Stable  Significant Diagnostic Studies: VAS [place] LOWER EXTREMITY VENOUS (DVT)  Result Date: 03/27/2022  Lower Venous DVT Study Patient Name:  Tammy Pineda [facility]  Date of Exam:   03/27/2022 Medical Rec #: [id number]        Accession #:    [id number] Date of Birth: 1945/09/11        Patient Gender: F Patient Age:   57 years Exam Location:  [facility] Procedure:      VAS [place] LOWER EXTREMITY VENOUS (DVT) Referring Phys: Lauraine Rinne --------------------------------------------------------------------------------  Indications: Swelling, and follow up DVT scan.  Risk Factors: Surgery Back surgery 03/14/22. Anticoagulation: Lovenox. Comparison Study: Prior study 03/20/22 positive Performing Technologist: McKayla Maag  Examination Guidelines: A complete evaluation includes B-mode imaging, spectral Doppler, color Doppler, and power Doppler as needed of all accessible portions of each vessel. Bilateral testing is considered an integral part of a complete examination. Limited examinations for reoccurring indications may be performed as noted. The reflux portion of the exam is performed with the patient in reverse Trendelenburg.  +---------+---------------+---------+-----------+----------+--------------+ RIGHT    CompressibilityPhasicitySpontaneityPropertiesThrombus Aging +---------+---------------+---------+-----------+----------+--------------+ CFV      Full           Yes      Yes                                  +---------+---------------+---------+-----------+----------+--------------+ SFJ      Full                                                        +---------+---------------+---------+-----------+----------+--------------+ FV Prox  Full                                                        +---------+---------------+---------+-----------+----------+--------------+ FV Mid   Full                                                        +---------+---------------+---------+-----------+----------+--------------+ FV DistalFull                                                        +---------+---------------+---------+-----------+----------+--------------+ PFV      Full                                                        +---------+---------------+---------+-----------+----------+--------------+  POP      Full           Yes      Yes                                 +---------+---------------+---------+-----------+----------+--------------+ PTV      Full                                                        +---------+---------------+---------+-----------+----------+--------------+ PERO     Partial        Yes      Yes                  Acute          +---------+---------------+---------+-----------+----------+--------------+   +----+---------------+---------+-----------+----------+--------------+ LEFTCompressibilityPhasicitySpontaneityPropertiesThrombus Aging +----+---------------+---------+-----------+----------+--------------+ CFV Full           Yes      Yes                                 +----+---------------+---------+-----------+----------+--------------+ SFJ Full                                                        +----+---------------+---------+-----------+----------+--------------+    Summary: RIGHT: - Findings consistent with acute deep vein thrombosis involving the right peroneal veins. - Findings appear essentially unchanged compared  to previous examination. - No cystic structure found in the popliteal fossa.  LEFT: - No evidence of common femoral vein obstruction.  *See table(s) above for measurements and observations. Electronically signed by Jamelle Haring on 03/27/2022 at 10:06:03 PM.    Final    VAS [place] LOWER EXTREMITY VENOUS (DVT)  Result Date: 03/20/2022  Lower Venous DVT Study Patient Name:  Tammy Pineda [facility]  Date of Exam:   03/20/2022 Medical Rec #: [id number]        Accession #:    [id number] Date of Birth: [birth year]        Patient Gender: F Patient Age:   58 years Exam Location:  [facility] Procedure:      VAS [place] LOWER EXTREMITY VENOUS (DVT) Referring Phys: Lauraine Rinne --------------------------------------------------------------------------------  Indications: Swelling.  Risk Factors: None identified. Comparison Study: No prior studies. Performing Technologist: Oliver Hum RVT  Examination Guidelines: A complete evaluation includes B-mode imaging, spectral Doppler, color Doppler, and power Doppler as needed of all accessible portions of each vessel. Bilateral testing is considered an integral part of a complete examination. Limited examinations for reoccurring indications may be performed as noted. The reflux portion of the exam is performed with the patient in reverse Trendelenburg.  +---------+---------------+---------+-----------+----------+--------------+ RIGHT    CompressibilityPhasicitySpontaneityPropertiesThrombus Aging +---------+---------------+---------+-----------+----------+--------------+ CFV      Full           Yes      Yes                                 +---------+---------------+---------+-----------+----------+--------------+ SFJ      Full                                                        +---------+---------------+---------+-----------+----------+--------------+  FV Prox  Full                                                         +---------+---------------+---------+-----------+----------+--------------+ FV Mid   Full                                                        +---------+---------------+---------+-----------+----------+--------------+ FV DistalFull                                                        +---------+---------------+---------+-----------+----------+--------------+ PFV      Full                                                        +---------+---------------+---------+-----------+----------+--------------+ POP      Full           Yes      Yes                                 +---------+---------------+---------+-----------+----------+--------------+ PTV      Full                                                        +---------+---------------+---------+-----------+----------+--------------+ PERO     Partial                                      Acute          +---------+---------------+---------+-----------+----------+--------------+   +---------+---------------+---------+-----------+----------+--------------+ LEFT     CompressibilityPhasicitySpontaneityPropertiesThrombus Aging +---------+---------------+---------+-----------+----------+--------------+ CFV      Full           Yes      Yes                                 +---------+---------------+---------+-----------+----------+--------------+ SFJ      Full                                                        +---------+---------------+---------+-----------+----------+--------------+ FV Prox  Full                                                        +---------+---------------+---------+-----------+----------+--------------+  FV Mid   Full                                                        +---------+---------------+---------+-----------+----------+--------------+ FV DistalFull                                                         +---------+---------------+---------+-----------+----------+--------------+ PFV      Full                                                        +---------+---------------+---------+-----------+----------+--------------+ POP      Full           Yes      Yes                                 +---------+---------------+---------+-----------+----------+--------------+ PTV      Full                                                        +---------+---------------+---------+-----------+----------+--------------+ PERO     Full                                                        +---------+---------------+---------+-----------+----------+--------------+     Summary: RIGHT: - Findings consistent with acute deep vein thrombosis involving the right peroneal veins. - No cystic structure found in the popliteal fossa.  LEFT: - There is no evidence of deep vein thrombosis in the lower extremity.  - No cystic structure found in the popliteal fossa.  *See table(s) above for measurements and observations. Electronically signed by Monica Martinez MD on 03/20/2022 at 3:55:18 PM.    Final    DG Lumbar Spine 2-3 Views  Result Date: 03/14/2022 CLINICAL DATA:  Spinal surgery EXAM: LUMBAR SPINE - 2-3 VIEW COMPARISON:  08/10/2020 FINDINGS: Two C-arm fluoroscopic images were obtained intraoperatively and submitted for post operative interpretation. Posterior and interbody fusion hardware are seen at the L3-L5 levels. 3 minutes 23 seconds of fluoroscopy time utilized. Radiation dose: 123.3 mGy. Please see the performing provider's procedural report for further detail. IMPRESSION: Intraoperative fluoroscopy for spinal fusion. Electronically Signed   By: Davina Poke D.O.   On: 03/14/2022 15:31   DG C-Arm 1-60 Min-No Report  Result Date: 03/14/2022 Fluoroscopy was utilized by the requesting physician.  No radiographic interpretation.   DG C-Arm 1-60 Min-No Report  Result Date: 03/14/2022 Fluoroscopy  was utilized by the requesting physician.  No radiographic interpretation.   DG C-Arm 1-60 Min-No Report  Result Date: 03/14/2022 Fluoroscopy was utilized by the requesting physician.  No radiographic  interpretation.   DG C-Arm 1-60 Min-No Report  Result Date: 03/14/2022 Fluoroscopy was utilized by the requesting physician.  No radiographic interpretation.    Labs:  Basic Metabolic Panel: Recent Labs  Lab 03/22/22 0550 03/25/22 0550  NA 133* 134*  K 4.3 4.1  CL 97* 97*  CO2 27 23  GLUCOSE 152* 129*  BUN 21 19  CREATININE 0.95 0.96  CALCIUM 8.9 9.1    CBC: Recent Labs  Lab 03/25/22 0550  WBC 9.1  NEUTROABS 5.6  HGB 10.4*  HCT 31.1*  MCV 88.4  PLT 294    CBG: Recent Labs  Lab 03/27/22 2123 03/28/22 0559 03/28/22 1203 03/28/22 1601 03/28/22 2052  GLUCAP 107* 130* 124* 123* 151*   Family history.  Father with CAD and colon cancer mother with CAD and hypertension.  Denies any esophageal or rectal cancer  Brief HPI:   REMELL PRUSAK is a 77 y.o. right-handed female with history of type 2 diabetes mellitus nonobstructive CAD obesity with a BMI 29.29 mild CKD with latest creatinine 0.93 hypertension hyperlipidemia glaucoma left TKA 2021.  Per chart review lives with spouse independent prior to admission.  She is a retired Therapist, sports.  Presented 03/14/2022 with chronic right lower extremity radiculopathy and low back pain.  Admission chemistries unremarkable except potassium 3.4 glucose 257 creatinine 0.93 surgical PCR MRSA positive.  MRI revealed severe degenerative spondylosis at L3-4 4-5 with severe stenosis lateral recess and foraminal degenerative disc disease with facet arthropathy.  She had failed multiple conservative measures.  Underwent L3-5 decompression and transforaminal lumbar body fusion 03/14/2022 per Dr. Pieter Partridge Dawley.  Back brace when out of bed.  Patient was cleared to begin subcutaneous heparin for DVT prophylaxis 03/19/2022.  Completed course of Bactroban for MRSA PCR  screening positive.  Therapy evaluations completed due to patient decreased functional ability right-leg numbness was admitted for a comprehensive rehab program.   Hospital Course: ALIZAYAH NEDLEY was admitted to rehab 03/20/2022 for inpatient therapies to consist of PT, ST and OT at least three hours five days a week. Past admission physiatrist, therapy team and rehab RN have worked together to provide customized collaborative inpatient rehab.  Pertaining to patient's lumbar stenosis/radiculopathy status post L3-5 decompression lumbar body fusion 03/14/2022.  Back brace when out of bed.  Surgical site clean and dry.  Venous Doppler study did reveal a small right peroneal DVT maintain on Lovenox for DVT prophylaxis until discharge with follow up doppler 03/28/2022 showing no propagation and recommendations of follow-up repeat vascular study in a couple of months to follow-up on any propagation...  Pain management with use of Robaxin as advised oxycodone for breakthrough pain.  Mood stabilization with Lexapro as well as Xanax and emotional support provided.  Blood sugars controlled maintained on Glucotrol.  Blood pressure monitor on HCTZ as well as Cozaar would need outpatient follow-up.  CAD nonobstructive by cardiac catheterization 2011 patient can resume low-dose aspirin.  Bouts of constipation resolved with laxative assistance.  Urine study positive nitrite cultures pending placed on Keflex empirically 03/25/2022.  OSA maintained on CPAP.   Blood pressures were monitored on TID basis and controlled  Diabetes has been monitored with ac/hs CBG checks and SSI was use prn for tighter BS control.    Rehab course: During patient's stay in rehab weekly team conferences were held to monitor patient's progress, set goals and discuss barriers to discharge. At admission, patient required minimal assist 35 feet rolling walker mod max assist stand pivot transfers  Physical exam.  Blood pressure 108/38 pulse 69  temperature 97.9 respirations 16 oxygen saturations 100% room air Constitutional.  No acute distress HEENT Head.  Normocephalic and atraumatic Eyes.  Pupils round and reactive to light no discharge without nystagmus Neck.  Supple nontender no JVD without thyromegaly Cardiac regular rate and rhythm without any extra sounds or murmur heard Abdomen.  Soft nontender positive bowel sounds without rebound Respiratory effort normal no respiratory distress without wheeze Skin.  Back incision clean and dry Neurologic alert oriented x 3 follows commands 4/5 strength throughout.  Sensation decreased on the left lateral thigh.    He/She  has had improvement in activity tolerance, balance, postural control as well as ability to compensate for deficits. He/She has had improvement in functional use RUE/LUE  and RLE/LLE as well as improvement in awareness.  Ambulates to the bathroom with supervision.  Back brace is indicated.  Minimal assist for stand to sit onto toilet.  Ambulates to the sink for handwashing brushing her teeth supervision.  She can ambulate up to 175 feet contact-guard.  Gather his belongings for activities day living homemaking.  Full family teaching completed plan discharge to home       Disposition: Discharged to home    Diet: Diabetic diet  Special Instructions: No driving smoking or alcohol  Back brace as indicated.  Follow-up with PCP on repeating vascular study to monitor any propagation of right peroneal DVT    Medications at discharge. 1.  Xanax 0.25 mg nightly as needed 2.  Dulcolax tablet 5 mg daily as needed constipation 3.  Alphagan 1 drop both eyes twice daily 4.  Bentyl 10 mg 3 times daily as needed spasms 5.  Colace 100 mg p.o. twice daily 6.  Lexapro 10 mg p.o. nightly 7.  Glucotrol XL 2.5 mg p.o. twice daily 8.  HCTZ 12.5 mg p.o. daily 9.  Xalatan eyedrops 0.005% 1 drop both eyes at bedtime 10.  Cozaar 25 mg p.o. nightly 11.  Antivert 12.5 mg p.o. 3  times daily as needed dizziness 12.  Robaxin 500 mg p.o. every 6 hours as needed muscle spasms 13.  Oxycodone 10 mg every 4 hours as needed pain 14.  MiraLAX daily hold for loose stools 15.  Crestor 5 mg p.o. nightly 16.  Timoptic 1 drop both eyes daily 17.  Aspirin 81 mg p.o. daily 18.  Voltaren gel 2 g 4 times daily to affected area 19.  Diflucan 150 mg p.o. daily 20.  Keflex 500 mg every 12 hours x 3 days and stop 21.  Prilosec 20 mg daily as needed   30-35 minutes were spent completing discharge summary and discharge planning   Discharge Instructions     Ambulatory referral to Physical Medicine Rehab   Complete by: As directed    Moderate complexity follow-up 1 to 2 weeks lumbar radiculopathy        Follow-up Information     Lovorn, Jinny Blossom, MD Follow up.   Specialty: Physical Medicine and Rehabilitation Why: Office to call for appointment Contact information: [id number] [place] [address] [phone]         Dawley, Pieter Partridge C, DO Follow up.   Why: Call for appointment Contact information: [address] [place] [address] [phone]                 Signed: Cathlyn Parsons 03/29/2022, 5:21 AM

## 2022-03-26 NOTE — Progress Notes (Signed)
Occupational Therapy Session Note  Patient Details  Name: GEANNIE POLKOWSKI MRN: [id number] Date of Birth: 15-Apr-1945  Today's Date: 03/26/2022 OT Individual Time: 1000-1045 OT Individual Time Calculation (min): 45 min    Short Term Goals: Week 1:  OT Short Term Goal 1 (Week 1): LTG=STG mod I  Skilled Therapeutic Interventions/Progress Updates:   Pt seen for skilled OT session with focus on shower training and use of DME and AE for precaution and falls mngt. Pt able to amb with RW from recliner to TTB with close S. Doffed all pull off clothing seated and supported standing level with S and reacher use. Pt has now her won Secondary school teacher for rehab and home use. Pt also used LH sponge for LB bathing reach and performed some of shower standing with grab bar support and seated all with close s. Dressed at edge of shower bench with close s except TED hose using reacher for LE reach. Amb back to recliner and left with chair alarm, needs and call button in reach.   Therapy Documentation Precautions:  Precautions Precautions: Back, Other (comment) Precaution Comments: spine precautions, lumbar corset when OOB Restrictions Weight Bearing Restrictions: No    Therapy/Group: Individual Therapy  Barnabas Lister 03/26/2022, 7:47 AM

## 2022-03-26 NOTE — Progress Notes (Signed)
Physical Therapy Session Note  Patient Details  Name: AZANI STOCKARD MRN: [id number] Date of Birth: May 27, 1945  Today's Date: 03/26/2022 PT Individual Time: 0800-0900 PT Individual Time Calculation (min): 60 min   Short Term Goals: Week 1:  PT Short Term Goal 1 (Week 1): = to LTGs based on ELOS  Skilled Therapeutic Interventions/Progress Updates:    pt received in bed and agreeable to therapy. Pt reports pain largely controlled on medication at this time, with movement modifications to address pain with mobility. Pt donned ted hose tot A and pants with min A to pull over feet. Shoes with assist for time. Sit to stand with supervision-CGA during session based on fatigue, with cueing during exercises for improved use of momentum and body mechanics. Pt ambulated to/from therapy gym with supervision fading to CGA with fatigue and RW. Pt performed 4x 5 Sit to stand for strength and endurance, with cueing for technique. Pt then performed standing march from staggered stance with 4 lb ankle weight x 15 BIL. Pt returned to room and to recliner, was left with all needs in reach and alarm active.   Therapy Documentation Precautions:  Precautions Precautions: Back, Other (comment) Precaution Comments: spine precautions, lumbar corset when OOB Restrictions Weight Bearing Restrictions: No General:       Therapy/Group: Individual Therapy  Mickel Fuchs 03/26/2022, 12:47 PM

## 2022-03-26 NOTE — Patient Care Conference (Signed)
Inpatient RehabilitationTeam Conference and Plan of Care Update Date: 03/26/2022   Time:11:47 AM    Patient Name: Tammy Pineda      Medical Record Number: [id number]  Date of Birth: January 03, 1946 Sex: Female         Room/Bed: 4W25C/4W25C-01 Payor Info: Payor: MEDICARE / Plan: MEDICARE PART A AND B / Product Type: *No Product type* /    Admit Date/Time:  03/20/2022  1:10 PM  Primary Diagnosis:  Lumbar radiculopathy  Hospital Problems: Principal Problem:   Lumbar radiculopathy    Expected Discharge Date: Expected Discharge Date: 03/29/22  Team Members Present: Physician leading conference: Dr. Alger Simons Social Worker Present: Loralee Pacas, [place] Nurse Present: Tacy Learn, RN PT Present: Ailene Rud, PT OT Present: Willeen Cass, OT;Roanna Epley, COTA PPS Coordinator present : Gunnar Fusi, SLP     Current Status/Progress Goal Weekly Team Focus  Bowel/Bladder   Continent of B/B. LBM 03/24/22   Maintain continence   Assist with toileting needs    Swallow/Nutrition/ Hydration               ADL's   bathing-supervision using AE; LB dressing with min A; UB dressing-assistance for TLSO; tranfsers with CGA/min A   mod I overall   BADLs, transfers, education    Mobility   CGA-supervision for gait and transfers, limited by pain   mod I overall  transfers, endurance, pain management    Communication                Safety/Cognition/ Behavioral Observations               Pain   CGA for gait and transfers, limited by pain   mod i transfers, supervision gait   gait, endurance, pain management    Skin   Posterior (Lumbar) Surgical incision with [place] dressing.   No sign of infection.  Prevent new skin breakdown      Discharge Planning:  D/c to home with support from her husband, and various family support.   Team Discussion: Lumbar radiculopathy. Continent B/B. Pain managed with PRN medications. Incision L/R lower back. Lumber corset  OOB.  Blood sugars controlled. Right peroneal DVT. Keflex for UTI Patient on target to meet rehab goals: yes, CGA/supervision gait/transfers. Bathing UB/LB using adaptive equipment minA.   *See Care Plan and progress notes for long and short-term goals.   Revisions to Treatment Plan:  Medication adjustments, monitor labs  Teaching Needs: Medications, safety, self care, gait/transfer training, etc.   Current Barriers to Discharge: Decreased caregiver support  Possible Resolutions to Barriers: Family education, nursing education, order recommended DME     Medical Summary Current Status: lumbar stenosis with radic s/p lumbar decompression and fusion. R peroneal DVT, +urinary urgency and UTI  Barriers to Discharge: Medical stability;Infection/IV Antibiotics   Possible Resolutions to [employer] Focus: f/u doppler this week. rxing UTI with keflex empirically. pain control   Continued Need for Acute Rehabilitation Level of Care: The patient requires daily medical management by a physician with specialized training in physical medicine and rehabilitation for the following reasons: Direction of a multidisciplinary physical rehabilitation program to maximize functional independence : Yes Medical management of patient stability for increased activity during participation in an intensive rehabilitation regime.: Yes Analysis of laboratory values and/or radiology reports with any subsequent need for medication adjustment and/or medical intervention. : Yes   I attest that I was present, lead the team conference, and concur with the assessment and plan of the team.  Ernest Pine 03/26/2022, 3:47 PM

## 2022-03-26 NOTE — Progress Notes (Signed)
PROGRESS NOTE   Subjective/Complaints: Reports BM today. Reports she sometimes gets yeast infection after UTI treatment- no symptoms at this time.   ROS: Patient denies fever, rash, sore throat, blurred vision, dizziness, nausea, vomiting, diarrhea, cough, shortness of breath or chest pain,  headache, or mood change.   Objective:   No results found. Recent Labs    03/25/22 0550  WBC 9.1  HGB 10.4*  HCT 31.1*  PLT 294     Recent Labs    03/25/22 0550  NA 134*  K 4.1  CL 97*  CO2 23  GLUCOSE 129*  BUN 19  CREATININE 0.96  CALCIUM 9.1     Intake/Output Summary (Last 24 hours) at 03/26/2022 0823 Last data filed at 03/26/2022 0811 Gross per 24 hour  Intake 837 ml  Output --  Net 837 ml         Physical Exam: Vital Signs Blood pressure (!) 137/51, pulse 76, temperature 98.8 F (37.1 C), temperature source Oral, resp. rate 18, height 5' 5"$  (1.651 m), weight 79.1 kg, SpO2 99 %. Constitutional: No distress . Vital signs reviewed. HEENT: NCAT, conjugate gaze,  oral membranes moist Neck: supple Cardiovascular: RRR without murmur. No JVD    Respiratory/Chest: CTA Bilaterally without wheezes or rales. Good air movement GI/Abdomen: BS +, non-tender, non-distended Ext: no clubbing, cyanosis, or edema Psych: pleasant and cooperative  Neurological: Ox3- Skin:    Comments: back incision cdi with honeycomb dressing in place  Neurological:     Comments: Patient is alert and oriented x 3.  Follows commands. 4/5 strength throughout. Sensation remains decreased on left lateral thigh.  Assessment/Plan: 1. Functional deficits which require 3+ hours per day of interdisciplinary therapy in a comprehensive inpatient rehab setting. Physiatrist is providing close team supervision and 24 hour management of active medical problems listed below. Physiatrist and rehab team continue to assess barriers to discharge/monitor patient  progress toward functional and medical goals  Care Tool:  Bathing    Body parts bathed by patient: Right arm, Left arm, Chest, Abdomen, Front perineal area, Buttocks, Right upper leg, Left upper leg, Right lower leg, Left lower leg, Face   Body parts bathed by helper: Buttocks, Right lower leg, Left lower leg     Bathing assist Assist Level: Contact Guard/Touching assist     Upper Body Dressing/Undressing Upper body dressing   What is the patient wearing?: Pull over shirt, Orthosis    Upper body assist Assist Level: Set up assist    Lower Body Dressing/Undressing Lower body dressing      What is the patient wearing?: Underwear/pull up, Pants     Lower body assist Assist for lower body dressing: Minimal Assistance - Patient > 75%     Toileting Toileting    Toileting assist Assist for toileting: Contact Guard/Touching assist     Transfers Chair/bed transfer  Transfers assist     Chair/bed transfer assist level: Contact Guard/Touching assist Chair/bed transfer assistive device: Armrests, Programmer, multimedia   Ambulation assist      Assist level: Contact Guard/Touching assist Assistive device: Walker-rolling Max distance: 150   Walk 10 feet activity   Assist  Assist level: Contact Guard/Touching assist Assistive device: Walker-rolling   Walk 50 feet activity   Assist    Assist level: Contact Guard/Touching assist Assistive device: Walker-rolling    Walk 150 feet activity   Assist Walk 150 feet activity did not occur: Safety/medical concerns  Assist level: Contact Guard/Touching assist Assistive device: Walker-rolling    Walk 10 feet on uneven surface  activity   Assist Walk 10 feet on uneven surfaces activity did not occur: Safety/medical concerns         Wheelchair     Assist Is the patient using a wheelchair?: Yes Type of Wheelchair: Manual    Wheelchair assist level: Dependent - Patient 0%       Wheelchair 50 feet with 2 turns activity    Assist        Assist Level: Dependent - Patient 0%   Wheelchair 150 feet activity     Assist      Assist Level: Dependent - Patient 0%   Blood pressure (!) 137/51, pulse 76, temperature 98.8 F (37.1 C), temperature source Oral, resp. rate 18, height 5' 5"$  (1.651 m), weight 79.1 kg, SpO2 99 %.  Medical Problem List and Plan: 1. Functional deficits secondary to lumbar stenosis/radiculopathy.  Status post L3-5 decompression and transforaminal lumbar body fusion 03/14/2022.  Back brace when out of bed.  May remove to shower.             -patient may shower             -ELOS/Goals: 1 week             -Continue CIR therapies including PT, OT   -Team conference today please see physician documentation under team conference tab  2.  Antithrombotics: -DVT/anticoagulation: Subcutaneous heparin.  Check vascular study  - DVT R peroneal-   Lovenox 70m qd after speaking with Dr Dawley-    -2/19 recheck doppler this week ~ 2/22             -antiplatelet therapy: N/A 3. Pain Management: continue Robaxin 500 mg every 6 hours as needed muscle spasms, oxycodone/tramadol as needed. Kpad ordered.   2/15- pain controlled when takes oxy 10 mg, but is very painful per pt 4. Anxiety: continue Lexapro 10 mg nightly, Xanax 0.25 mg bedtime as needed             -antipsychotic agents: N/A 5. Neuropsych/cognition: This patient is capable of making decisions on her own behalf. 6. Skin/Wound Care: Routine skin checks 7. Fluids/Electrolytes/Nutrition: Routine in and outs with follow-up chemistries 8.  MRSA PCR screening positive.  Completed course of Bactroban 9.  Diabetes mellitus.  Continue Glucotrol 2.5 mg twice daily.  2/15- CBGs 75 to 207- will give 24 hours and determine if need to upgrade meds  2/20 well controlled, continue current regimen CBG (last 3)  Recent Labs    03/25/22 2109 03/26/22 0555 03/26/22 1112  GLUCAP 159* 149* 132*     10.  Hypertension.  HCTZ 12.5 mg daily, Cozaar 50 mg nightly, monitor with increased mobility  2/20 well controlled      03/26/2022   12:45 PM 03/26/2022    5:17 AM 03/25/2022    7:37 PM  Vitals with BMI  Systolic [id number] Diastolic 48 51 35  Pulse 66 76 67    11.  GERD.  Continue Protonix 12.  Hyperlipidemia.  Continue Crestor 13.  History of CAD.  Nonobstructive by cardiac catheterization 2011.  Will discuss with neurosurgery  when to resume low-dose aspirin 14.  Constipation.  Continue Colace 100 mg twice daily, Dulcolax tablet daily as needed  2/15- LBM 7+ days ago- will give Sorbitol 30cc x1 after therapy- might end up needing enema?  2/16- No results- will give Miralax and prune juice- also 60cc Sorbitol at noon and f/u with soap suds enema at 4pm- if no results, have nursing call me and wil do Mg citrate  -LBM 2/18 15.  OSA.  continue CPAP  16. Hyponatremia  2/15- will recheck Monday since Na 131  2/19- Na+ trending up, now 134 today 17. Hypokalemia  2/15- will replete due to K+ of 3.2- 40 mEq x2 and recheck in AM  2/19- K+ 4.1, not currently on supplement 18. Moderate malnutrition- Albumin 2.6-  19. Urinary urgency/dysuria 2/19- UTI  -check UA/urine culture  -Keflex was started for UTI, monitor urine culture, pt says she will let me know if she develops symptoms of yeast infection    LOS: 6 days A FACE TO FACE EVALUATION WAS PERFORMED  Jennye Boroughs 03/26/2022, 8:23 AM

## 2022-03-26 NOTE — Progress Notes (Signed)
Patient had a quite night. Required CPAP. Assistance to bathroom as needed. Medications for pain given before bed time and this morning with breakfast. VS stable. CBG monitored per protocol. TLO brace when Out of bed. Safety maintained at all times.

## 2022-03-26 NOTE — Progress Notes (Signed)
Pt has a CPAP at bedside, pt stated she will place on herself and no assistance is needed.

## 2022-03-27 ENCOUNTER — Inpatient Hospital Stay (HOSPITAL_COMMUNITY): Payer: Medicare Other

## 2022-03-27 DIAGNOSIS — M25511 Pain in right shoulder: Secondary | ICD-10-CM

## 2022-03-27 DIAGNOSIS — M7989 Other specified soft tissue disorders: Secondary | ICD-10-CM

## 2022-03-27 DIAGNOSIS — B3731 Acute candidiasis of vulva and vagina: Secondary | ICD-10-CM

## 2022-03-27 DIAGNOSIS — K5900 Constipation, unspecified: Secondary | ICD-10-CM

## 2022-03-27 LAB — GLUCOSE, CAPILLARY
Glucose-Capillary: 127 mg/dL — ABNORMAL HIGH (ref 70–99)
Glucose-Capillary: 116 mg/dL — ABNORMAL HIGH (ref 70–99)
Glucose-Capillary: 128 mg/dL — ABNORMAL HIGH (ref 70–99)
Glucose-Capillary: 107 mg/dL — ABNORMAL HIGH (ref 70–99)

## 2022-03-27 MED ORDER — LOSARTAN POTASSIUM 50 MG PO TABS
25.0000 mg | ORAL_TABLET | Freq: Every day | ORAL | Status: DC
  Administered 2022-03-27 – 2022-03-28 (×2): 25 mg via ORAL
  Filled 2022-03-27 (×2): qty 1

## 2022-03-27 MED ORDER — FLUCONAZOLE 150 MG PO TABS
150.0000 mg | ORAL_TABLET | Freq: Every day | ORAL | Status: DC
  Administered 2022-03-27 – 2022-03-28 (×2): 150 mg via ORAL
  Filled 2022-03-27 (×2): qty 1

## 2022-03-27 MED ORDER — DICLOFENAC SODIUM 1 % EX GEL
2.0000 g | Freq: Four times a day (QID) | CUTANEOUS | Status: DC
  Administered 2022-03-27 – 2022-03-29 (×3): 2 g via TOPICAL
  Filled 2022-03-27: qty 100

## 2022-03-27 NOTE — Progress Notes (Signed)
Occupational Therapy Session Note  Patient Details  Name: Tammy Pineda MRN: [id number] Date of Birth: August 10, 1945  Today's Date: 03/27/2022 OT Individual Time: 0900-1000 OT Individual Time Calculation (min): 60 min    Short Term Goals: Week 1:  OT Short Term Goal 1 (Week 1): LTG=STG mod I  Skilled Therapeutic Interventions/Progress Updates:   Pt seen for skilled OT session this am. Pt deferring shower retraining completion until tomorrow which will be Grad Day from OT. Pt reports back, LE and R sh pain roughly 7/10 and had taken main pain meds already. Pt requested toileting for BM and voiding (see Flowsheets for data). Pt S only for toileting and RW transfer on and off commode. Pt open to moist heat to R sh and ice to LB in sidelying. OT applied both with skin protection and performed light STM to R shoulder complex while modalities in place. Applied gentle vibration to upper scap/neck and sh complex region as well as long mm of upper back avoiding surgical region. Pain relief reported and OT left pt in sidelying to rest with removal of modalities and pain reported 2/10 at max in all pain areas. Care coord with OT for later session in prep for discharge planning later week. Bed alarm set, needs and nurse call button in reach.   Therapy Documentation Precautions:  Precautions Precautions: Back, Other (comment) Precaution Comments: spine precautions, lumbar corset when OOB Restrictions Weight Bearing Restrictions: No   Therapy/Group: Individual Therapy  Barnabas Lister 03/27/2022, 7:33 AM

## 2022-03-27 NOTE — Progress Notes (Signed)
Occupational Therapy Session Note  Patient Details  Name: Tammy Pineda MRN: [id number] Date of Birth: [birth year]  Today's Date: 03/27/2022 OT Individual Time: 1420-1501 OT Individual Time Calculation (min): 41 min    Short Term Goals: Week 1:  OT Short Term Goal 1 (Week 1): LTG=STG mod I  Skilled Therapeutic Interventions/Progress Updates:    Pt received supine with no c/o pain, agreeable to OT session. She came to EOB with mod I using the bed rail. She donned pants with min A d/t urgency in needing to use the bathroom. She donned lumbar corset with (S). She completed functional mobility to the bathroom with (S) using the RW. She completed toileting tasks with (S). She completed 120 ft of functional mobility to the therapy gym with (S) using the RW. Pt stating "I won't need to use the RW at home". So after getting to the gym she worked on functional mobility and stepping activity over a 5 in wide step. She required CGA- min A overall with cueing required for arm swing and using protective UE responses for righting reactions. She required a seated rest break and then completed functional stepping activity onto a large 8 in step, which she reported to be too difficult (min-mod A with no or single UE support). She was able to complete the activity with a 6 in step and single rail support- completed to improve dynamic standing balance and functional activity tolerance. She returned to her room and was left sitting up in the recliner with all needs met.   Therapy Documentation Precautions:  Precautions Precautions: Back, Other (comment) Precaution Comments: spine precautions, lumbar corset when OOB Restrictions Weight Bearing Restrictions: No  Therapy/Group: Individual Therapy  Curtis Sites 03/27/2022, 6:12 AM

## 2022-03-27 NOTE — Progress Notes (Signed)
Physical Therapy Session Note  Patient Details  Name: Tammy Pineda MRN: [id number] Date of Birth: [birth year]  Today's Date: 03/27/2022 PT Individual Time: 1030-1118 PT Individual Time Calculation (min): 48 min   Short Term Goals: Week 1:  PT Short Term Goal 1 (Week 1): = to LTGs based on ELOS  Skilled Therapeutic Interventions/Progress Updates:    Pt received sidelying in bed w/ CPAP on and agreeable to therapy. Expressed that earleir she was in a considerable amount of pain. Current pain pt described was "comfortable." Pt doffed CPAP independently and sat up at EOB independently. Pt donned lumbar corset brace independently in sitting and  shoes w/ Min A. Ambulated to bathroom w/ RW and supervision. Pt mod I w/ toileting and Independent w/ pericare. Pt ambulated to 272f x2 to therapy gym w/ RW and Supervision and back to room at the end of session.   Sit to stand transfer throughout session were supervision assist for pt w/o using RW to assist in power up. Pt demonstrated improved eccentric control when transferring to sit.   Standing exercises done at parallel bar for pt safety. Pt performed standing abduction 2x10, last set w/ 1.5# ankle wts. Vc to keep toes pointed forward. Standing marches 2x20, last set w/ 1.5# ankle wts. Vc for pt to engage core to stand upright. Single leg balance 2x30 secs w/o foam and 2x30 secs standing on foam pad. Therapist challenged pt to only use two fingers for support, Pt began to sway laterally, but was bale to recover using parallel bar, therapist providing close supervision for safety. Pt returned to room and left sitting in recliner chair w/ nurse tech in the room dressing the bed. Call bell in reach and all needs met.     Therapy Documentation Precautions:  Precautions Precautions: Back, Other (comment) Precaution Comments: spine precautions, lumbar corset when OOB Restrictions Weight Bearing Restrictions: No General:     Pain: pt explains she  was in a "comfortable" amount of pain       Therapy/Group: Individual Therapy  Tammy Pineda 03/27/2022, 12:42 PM

## 2022-03-27 NOTE — Progress Notes (Signed)
Patient's CPAP machine is set up and ready at bedside. Patient stated she will place on herself when ready.

## 2022-03-27 NOTE — Progress Notes (Signed)
Physical Therapy Session Note  Patient Details  Name: Tammy Pineda MRN: [id number] Date of Birth: 1945/07/30  Today's Date: 03/27/2022 PT Individual Time: 1620-1725 PT Individual Time Calculation (min): 65 min   Short Term Goals: Week 1:  PT Short Term Goal 1 (Week 1): = to LTGs based on ELOS  Skilled Therapeutic Interventions/Progress Updates:   Pt received standing at sink with SO, who is present throughout session for education. Gait training through hall with RW x 168f with supervision assist, cures for husband for safe supervision assist guarding technique when fatigued. Gait training in rehab gym with no AD x [id number] well as up/down 12 steps with BUE supported on rails and supervision assist with step to gait pattern.  Car transfer training with RW with cues for sit>pivot to limit any time standing in SLS. [place]W over unlevel cement sidewalk 2 x [id number] cues for AD management over cracks in side walk. Pt then requesting to return to room due to BLE weakness. Patient returned to room and performed stand pivot to recliner with no Ad and supervision assist. Pt left sitting in recliner with call bell in reach and all needs met.        Therapy Documentation Precautions:  Precautions Precautions: Back, Other (comment) Precaution Comments: spine precautions, lumbar corset when OOB Restrictions Weight Bearing Restrictions: No  Pain: Pain Assessment Pain Scale: 0-10 Pain Score: 0-No pain    Therapy/Group: Individual Therapy  AusLorie Phenix21/2024, 5:47 PM

## 2022-03-27 NOTE — Progress Notes (Signed)
Right lower extremity venous study completed.   Preliminary results relayed to [facility].  Please see CV Procedures for preliminary results.  Starlyn Droge, RVT  2:26 PM 03/27/22

## 2022-03-27 NOTE — Progress Notes (Addendum)
PROGRESS NOTE   Subjective/Complaints: Reports she is having symptoms of yeast infection. She has chronic R shoulder pain- voltaren gel has helped in past. She had L leg pain this AM-improved with muscle relaxer.   ROS: Patient denies fever, rash, sore throat, blurred vision, dizziness, nausea, vomiting, diarrhea, cough, shortness of breath or chest pain,  headache, or mood change.  Dysuria-Improved  Objective:   No results found. Recent Labs    03/25/22 0550  WBC 9.1  HGB 10.4*  HCT 31.1*  PLT 294     Recent Labs    03/25/22 0550  NA 134*  K 4.1  CL 97*  CO2 23  GLUCOSE 129*  BUN 19  CREATININE 0.96  CALCIUM 9.1     Intake/Output Summary (Last 24 hours) at 03/27/2022 0904 Last data filed at 03/27/2022 0700 Gross per 24 hour  Intake 712 ml  Output --  Net 712 ml         Physical Exam: Vital Signs Blood pressure 127/60, pulse 67, temperature 98 F (36.7 C), resp. rate 16, height 5' 5"$  (1.651 m), weight 79.1 kg, SpO2 97 %. Constitutional: No distress . Vital signs reviewed. Sitting in chair- appears comfortable HEENT: NCAT, conjugate gaze,  oral membranes moist Neck: supple Cardiovascular: RRR without murmur. No JVD    Respiratory/Chest: CTA Bilaterally without wheezes or rales. GI/Abdomen: BS +, non-tender, non-distended, soft Ext: no clubbing, cyanosis, or edema Psych: pleasant and cooperative  Neurological: Ox3- Skin:    Comments: back incision cdi with honeycomb dressing in place  Neurological:     Comments: Patient is alert and oriented x 3.  Follows commands. 4/5 strength throughout. Sensation remains decreased on left lateral thigh.  Assessment/Plan: 1. Functional deficits which require 3+ hours per day of interdisciplinary therapy in a comprehensive inpatient rehab setting. Physiatrist is providing close team supervision and 24 hour management of active medical problems listed  below. Physiatrist and rehab team continue to assess barriers to discharge/monitor patient progress toward functional and medical goals  Care Tool:  Bathing    Body parts bathed by patient: Right arm, Left arm, Chest, Abdomen, Front perineal area, Buttocks, Right upper leg, Left upper leg, Right lower leg, Left lower leg, Face   Body parts bathed by helper: Buttocks, Right lower leg, Left lower leg     Bathing assist Assist Level: Contact Guard/Touching assist     Upper Body Dressing/Undressing Upper body dressing   What is the patient wearing?: Pull over shirt, Orthosis    Upper body assist Assist Level: Set up assist    Lower Body Dressing/Undressing Lower body dressing      What is the patient wearing?: Underwear/pull up, Pants     Lower body assist Assist for lower body dressing: Minimal Assistance - Patient > 75%     Toileting Toileting    Toileting assist Assist for toileting: Supervision/Verbal cueing     Transfers Chair/bed transfer  Transfers assist     Chair/bed transfer assist level: Contact Guard/Touching assist Chair/bed transfer assistive device: Armrests, Programmer, multimedia   Ambulation assist      Assist level: Contact Guard/Touching assist Assistive device: Walker-rolling Max distance: 150  Walk 10 feet activity   Assist     Assist level: Contact Guard/Touching assist Assistive device: Walker-rolling   Walk 50 feet activity   Assist    Assist level: Contact Guard/Touching assist Assistive device: Walker-rolling    Walk 150 feet activity   Assist Walk 150 feet activity did not occur: Safety/medical concerns  Assist level: Contact Guard/Touching assist Assistive device: Walker-rolling    Walk 10 feet on uneven surface  activity   Assist Walk 10 feet on uneven surfaces activity did not occur: Safety/medical concerns         Wheelchair     Assist Is the patient using a wheelchair?: Yes Type  of Wheelchair: Manual    Wheelchair assist level: Dependent - Patient 0%      Wheelchair 50 feet with 2 turns activity    Assist        Assist Level: Dependent - Patient 0%   Wheelchair 150 feet activity     Assist      Assist Level: Dependent - Patient 0%   Blood pressure 127/60, pulse 67, temperature 98 F (36.7 C), resp. rate 16, height 5' 5"$  (1.651 m), weight 79.1 kg, SpO2 97 %.  Medical Problem List and Plan: 1. Functional deficits secondary to lumbar stenosis/radiculopathy.  Status post L3-5 decompression and transforaminal lumbar body fusion 03/14/2022.  Back brace when out of bed.  May remove to shower.             -patient may shower             -ELOS/Goals: 1 week             -Continue CIR therapies including PT, OT   -Estimated discharge date 03/29/22  2.  Antithrombotics: -DVT/anticoagulation: Subcutaneous heparin.  Check vascular study  - DVT R peroneal-   Lovenox 85m qd after speaking with Dr Dawley-    -2/19 recheck doppler this week ~ 2/22             -antiplatelet therapy: N/A 3. Pain Management: continue Robaxin 500 mg every 6 hours as needed muscle spasms, oxycodone/tramadol as needed. Kpad ordered.   2/15- pain controlled when takes oxy 10 mg, but is very painful per pt  2/21 voltaren gel R shoulder 4. Anxiety: continue Lexapro 10 mg nightly, Xanax 0.25 mg bedtime as needed             -antipsychotic agents: N/A 5. Neuropsych/cognition: This patient is capable of making decisions on her own behalf. 6. Skin/Wound Care: Routine skin checks 7. Fluids/Electrolytes/Nutrition: Routine in and outs with follow-up chemistries 8.  MRSA PCR screening positive.  Completed course of Bactroban 9.  Diabetes mellitus.  Continue Glucotrol 2.5 mg twice daily.  2/15- CBGs 75 to 207- will give 24 hours and determine if need to upgrade meds  2/21 controlled continue current medications CBG (last 3)  Recent Labs    03/26/22 1638 03/26/22 2057 03/27/22 0624   GLUCAP 100* 194* 127*     10.  Hypertension.  HCTZ 12.5 mg daily, Cozaar 50 mg nightly, monitor with increased mobility  2/21, decrease cozaar to 238m    03/27/2022    4:26 AM 03/26/2022    8:30 PM 03/26/2022   12:45 PM  Vitals with BMI  Systolic [id number] 60 48 48  Pulse 67 64 66    11.  GERD.  Continue Protonix 12.  Hyperlipidemia.  Continue Crestor 13.  History of CAD.  Nonobstructive by cardiac  catheterization 2011.  Will discuss with neurosurgery when to resume low-dose aspirin 14.  Constipation.  Continue Colace 100 mg twice daily, Dulcolax tablet daily as needed  2/15- LBM 7+ days ago- will give Sorbitol 30cc x1 after therapy- might end up needing enema?  2/16- No results- will give Miralax and prune juice- also 60cc Sorbitol at noon and f/u with soap suds enema at 4pm- if no results, have nursing call me and wil do Mg citrate  -LBM 2/20 15.  OSA.  continue CPAP  16. Hyponatremia  2/15- will recheck Monday since Na 131  2/19- Na+ trending up, now 134 today 17. Hypokalemia  2/15- will replete due to K+ of 3.2- 40 mEq x2 and recheck in AM  2/19- K+ 4.1, not currently on supplement 18. Moderate malnutrition- Albumin 2.6-  19. Urinary urgency/dysuria 2/19- UTI  -check UA/urine culture  -Keflex was started for UTI, monitor urine culture.  Symtoms improved per patient 20. Vaginal yeast infection  -2/21 She says diflucan has worked best for her in past. 112m diflucan ordered   LOS: 7 days A FACE TO FACE EVALUATION WAS PERFORMED  YJennye Boroughs2/21/2024, 9:04 AM

## 2022-03-28 ENCOUNTER — Other Ambulatory Visit (HOSPITAL_COMMUNITY): Payer: Self-pay

## 2022-03-28 DIAGNOSIS — I82451 Acute embolism and thrombosis of right peroneal vein: Secondary | ICD-10-CM

## 2022-03-28 LAB — GLUCOSE, CAPILLARY
Glucose-Capillary: 130 mg/dL — ABNORMAL HIGH (ref 70–99)
Glucose-Capillary: 124 mg/dL — ABNORMAL HIGH (ref 70–99)
Glucose-Capillary: 123 mg/dL — ABNORMAL HIGH (ref 70–99)
Glucose-Capillary: 151 mg/dL — ABNORMAL HIGH (ref 70–99)

## 2022-03-28 MED ORDER — HYDROCHLOROTHIAZIDE 12.5 MG PO TABS
12.5000 mg | ORAL_TABLET | Freq: Every day | ORAL | 0 refills | Status: DC
  Filled 2022-03-28: qty 30, 30d supply, fill #0

## 2022-03-28 MED ORDER — CEPHALEXIN 500 MG PO CAPS
500.0000 mg | ORAL_CAPSULE | Freq: Two times a day (BID) | ORAL | 0 refills | Status: DC
  Filled 2022-03-28: qty 6, 3d supply, fill #0

## 2022-03-28 MED ORDER — DICYCLOMINE HCL 10 MG PO CAPS: 10.0000 mg | ORAL_CAPSULE | Freq: Three times a day (TID) | ORAL | 0 refills | Status: AC | PRN

## 2022-03-28 MED ORDER — DICLOFENAC SODIUM 1 % EX GEL: 2.0000 g | Freq: Four times a day (QID) | CUTANEOUS | 0 refills | Status: AC

## 2022-03-28 MED ORDER — ALPRAZOLAM 0.5 MG PO TABS
0.2500 mg | ORAL_TABLET | Freq: Every evening | ORAL | 0 refills | Status: DC | PRN
  Filled 2022-03-28: qty 30, 60d supply, fill #0

## 2022-03-28 MED ORDER — GLIPIZIDE ER 2.5 MG PO TB24
ORAL_TABLET | ORAL | 0 refills | Status: DC
  Filled 2022-03-28: qty 60, 30d supply, fill #0

## 2022-03-28 MED ORDER — OXYCODONE HCL 10 MG PO TABS: 10.0000 mg | ORAL_TABLET | ORAL | 0 refills | Status: DC | PRN

## 2022-03-28 MED ORDER — POLYETHYLENE GLYCOL 3350 17 G PO PACK: 17.0000 g | PACK | Freq: Every day | ORAL | 0 refills | Status: DC

## 2022-03-28 MED ORDER — CEPHALEXIN 500 MG PO CAPS: 500.0000 mg | ORAL_CAPSULE | Freq: Two times a day (BID) | ORAL | 0 refills | Status: DC

## 2022-03-28 MED ORDER — DOCUSATE SODIUM 100 MG PO CAPS: 100.0000 mg | ORAL_CAPSULE | Freq: Two times a day (BID) | ORAL | 0 refills | Status: DC

## 2022-03-28 MED ORDER — ROSUVASTATIN CALCIUM 5 MG PO TABS
5.0000 mg | ORAL_TABLET | Freq: Every day | ORAL | 0 refills | Status: DC
  Filled 2022-03-28: qty 30, 30d supply, fill #0

## 2022-03-28 MED ORDER — ALPRAZOLAM 0.5 MG PO TABS: 0.2500 mg | ORAL_TABLET | Freq: Every evening | ORAL | 0 refills | Status: AC | PRN

## 2022-03-28 MED ORDER — MECLIZINE HCL 12.5 MG PO TABS: 12.5000 mg | ORAL_TABLET | Freq: Three times a day (TID) | ORAL | 0 refills | Status: AC | PRN

## 2022-03-28 MED ORDER — VITAMIN B-12 5000 MCG PO TBDP
1000.0000 ug | ORAL_TABLET | ORAL | 0 refills | Status: DC
  Filled 2022-03-28: qty 30, fill #0

## 2022-03-28 MED ORDER — BISACODYL 5 MG PO TBEC: 5.0000 mg | DELAYED_RELEASE_TABLET | Freq: Every day | ORAL | 0 refills | Status: AC | PRN

## 2022-03-28 MED ORDER — LOSARTAN POTASSIUM 25 MG PO TABS
25.0000 mg | ORAL_TABLET | Freq: Every day | ORAL | 0 refills | Status: DC
  Filled 2022-03-28: qty 30, 30d supply, fill #0

## 2022-03-28 MED ORDER — OMEPRAZOLE 20 MG PO CPDR
20.0000 mg | DELAYED_RELEASE_CAPSULE | Freq: Every day | ORAL | 0 refills | Status: DC | PRN
  Filled 2022-03-28: qty 30, 30d supply, fill #0

## 2022-03-28 MED ORDER — FLUCONAZOLE 150 MG PO TABS
150.0000 mg | ORAL_TABLET | Freq: Every day | ORAL | 0 refills | Status: DC
  Filled 2022-03-28: qty 5, 5d supply, fill #0

## 2022-03-28 MED ORDER — METHOCARBAMOL 500 MG PO TABS: 500.0000 mg | ORAL_TABLET | Freq: Four times a day (QID) | ORAL | 0 refills | Status: AC | PRN

## 2022-03-28 MED ORDER — FLUCONAZOLE 150 MG PO TABS: 150.0000 mg | ORAL_TABLET | Freq: Every day | ORAL | 0 refills | Status: DC

## 2022-03-28 MED ORDER — ESCITALOPRAM OXALATE 10 MG PO TABS
10.0000 mg | ORAL_TABLET | Freq: Every day | ORAL | 0 refills | Status: DC
  Filled 2022-03-28: qty 30, 30d supply, fill #0

## 2022-03-28 MED ORDER — CHOLECALCIFEROL 125 MCG (5000 UT) PO TABS: 5000.0000 [IU] | ORAL_TABLET | Freq: Every day | ORAL | 0 refills | Status: AC

## 2022-03-28 MED ORDER — DICYCLOMINE HCL 10 MG PO CAPS
10.0000 mg | ORAL_CAPSULE | Freq: Three times a day (TID) | ORAL | 0 refills | Status: DC | PRN
  Filled 2022-03-28: qty 10, 4d supply, fill #0

## 2022-03-28 MED ORDER — VITAMIN B-12 5000 MCG PO TBDP: 1000.0000 ug | ORAL_TABLET | ORAL | 0 refills | Status: AC

## 2022-03-28 MED ORDER — MECLIZINE HCL 12.5 MG PO TABS
12.5000 mg | ORAL_TABLET | Freq: Three times a day (TID) | ORAL | 0 refills | Status: DC | PRN
  Filled 2022-03-28: qty 21, 7d supply, fill #0

## 2022-03-28 MED ORDER — GLIPIZIDE ER 2.5 MG PO TB24: ORAL_TABLET | ORAL | 0 refills | Status: DC

## 2022-03-28 MED ORDER — DICLOFENAC SODIUM 1 % EX GEL
2.0000 g | Freq: Four times a day (QID) | CUTANEOUS | 0 refills | Status: DC
  Filled 2022-03-28: qty 100, 25d supply, fill #0

## 2022-03-28 MED ORDER — OMEPRAZOLE 20 MG PO CPDR: 20.0000 mg | DELAYED_RELEASE_CAPSULE | Freq: Every day | ORAL | 0 refills | Status: AC | PRN

## 2022-03-28 MED ORDER — CHOLECALCIFEROL 125 MCG (5000 UT) PO TABS
5000.0000 [IU] | ORAL_TABLET | Freq: Every day | ORAL | 0 refills | Status: DC
  Filled 2022-03-28: qty 30, 30d supply, fill #0

## 2022-03-28 MED ORDER — CEPHALEXIN 250 MG PO CAPS
500.0000 mg | ORAL_CAPSULE | Freq: Two times a day (BID) | ORAL | Status: DC
  Administered 2022-03-28 – 2022-03-29 (×2): 500 mg via ORAL
  Filled 2022-03-28 (×2): qty 2

## 2022-03-28 MED ORDER — LOSARTAN POTASSIUM 25 MG PO TABS: 25.0000 mg | ORAL_TABLET | Freq: Every day | ORAL | 0 refills | Status: AC

## 2022-03-28 MED ORDER — ROSUVASTATIN CALCIUM 5 MG PO TABS: 5.0000 mg | ORAL_TABLET | Freq: Every day | ORAL | 0 refills | Status: AC

## 2022-03-28 MED ORDER — HYDROCHLOROTHIAZIDE 12.5 MG PO TABS: 12.5000 mg | ORAL_TABLET | Freq: Every day | ORAL | 0 refills | Status: AC

## 2022-03-28 MED ORDER — METHOCARBAMOL 500 MG PO TABS
500.0000 mg | ORAL_TABLET | Freq: Four times a day (QID) | ORAL | 0 refills | Status: DC | PRN
  Filled 2022-03-28: qty 60, 15d supply, fill #0

## 2022-03-28 MED ORDER — ESCITALOPRAM OXALATE 10 MG PO TABS: 10.0000 mg | ORAL_TABLET | Freq: Every day | ORAL | 0 refills | Status: AC

## 2022-03-28 MED ORDER — OXYCODONE HCL 10 MG PO TABS
10.0000 mg | ORAL_TABLET | ORAL | 0 refills | Status: DC | PRN
  Filled 2022-03-28: qty 30, 5d supply, fill #0

## 2022-03-28 NOTE — Progress Notes (Signed)
Inpatient Rehabilitation Discharge Medication Review by a Pharmacist  A complete drug regimen review was completed for this patient to identify any potential clinically significant medication issues.  High Risk Drug Classes Is patient taking? Indication by Medication  Antipsychotic No   Anticoagulant No   Antibiotic Yes Po Cephalexin - UTI Po Fluconazole - yeast infection  Opioid Yes Oxycodone - prn pain  Antiplatelet No   Hypoglycemics/insulin Yes Glipizide - DM  Vasoactive Medication Yes HCTZ, losartan- BP  Chemotherapy No   Other Yes Alprazolam - prn anxiety Dicyclomine - prn spasms Escitalopram - mood Alphagan, latanoprost, timolol - glaucoma Meclizine - dizziness Methocarbamol - prn spasms Omeprazole - reflux Rosuvastatin - HLD     Type of Medication Issue Identified Description of Issue Recommendation(s)  Drug Interaction(s) (clinically significant)     Duplicate Therapy     Allergy     No Medication Administration End Date     Incorrect Dose     Additional Drug Therapy Needed     Significant med changes from prior encounter (inform family/care partners about these prior to discharge).    Other       Clinically significant medication issues were identified that warrant physician communication and completion of prescribed/recommended actions by midnight of the next day:  No   Pharmacist comments: None  Time spent performing this drug regimen review (minutes):  20 minutes  Anette Guarneri, PharmD

## 2022-03-28 NOTE — Evaluation (Signed)
Recreational Therapy Assessment and Plan  Patient Details  Name: Tammy Pineda MRN: [id number] Date of Birth: Aug 31, 1945 Today's Date: 03/28/2022  Rehab Potential:  Good ELOS:   d/c 2/23  Assessment Hospital Problem: Principal Problem:   Lumbar radiculopathy     Past Medical History:      Past Medical History:  Diagnosis Date   Anxiety     Arthritis     Chronic kidney disease     Coronary artery disease      nonobstructive by cardiac catheterization 2011 with a 30% LAD lesion   Depression     Diabetes ([place])      Type 2   GERD (gastroesophageal reflux disease)     Glaucoma     Hypercholesteremia     Hypertension     NASH (nonalcoholic steatohepatitis)      NASH   Pneumonia      30 years ago   PVC (premature ventricular contraction)     Seasonal allergies     Sleep apnea     Spinal stenosis      Past Surgical History:       Past Surgical History:  Procedure Laterality Date   ANTERIOR AND POSTERIOR VAGINAL REPAIR       APPENDECTOMY   1959   BLADDER SUSPENSION       BUNIONECTOMY       CARDIAC CATHETERIZATION   2011    clean cath   CARDIOVASCULAR STRESS TEST   2022   CATARACT EXTRACTION Left 2015   CATARACT EXTRACTION W/ INTRAOCULAR LENS IMPLANT Right 2010   KNEE ARTHROSCOPY Left     NASAL RECONSTRUCTION   1976   ROBOTIC ASSISTED LAPAROSCOPIC SACROCOLPOPEXY        with mesh, urethral sling, posterior vaginal repair   SHOULDER ARTHROSCOPY Left     SHOULDER SURGERY Right 10/16/2021    repair   TOTAL ABDOMINAL HYSTERECTOMY   1976   TOTAL KNEE ARTHROPLASTY Left 01/03/2020    Procedure: TOTAL KNEE ARTHROPLASTY;  Surgeon: Gaynelle Arabian, MD;  Location: [facility];  Service: Orthopedics;  Laterality: Left;  68mn   TRANSFORAMINAL LUMBAR INTERBODY FUSION W/ MIS 2 LEVEL Right 03/14/2022    Procedure: Minimally Invasive Surgery Decompression, Transforaminal Lumbar Interbody Fusion Lumbar three-four, Lumbar four-five;  Surgeon: Dawley, TTheodoro Doing DO;  Location: [facility]  Service:  Neurosurgery;  Laterality: Right;      Assessment & Plan Clinical Impression: Patient is a 77y.o. year old right-handed female with history of type 2 diabetes mellitus, nonobstructive CAD, obesity with BMI 29.29, mild CKD with latest creatinine 0.93, hypertension, hyperlipidemia, glaucoma, left TKA 2021.  Per chart review patient lives with spouse and multiple family members.  1 level home.  Independent and driving prior to admission.  She is a retired RTherapist, sports  Presented 03/14/2022 with chronic right lower extremity radiculopathy and low back pain.  Admission chemistries unremarkable with hemoglobin 11.6, potassium 3.4, glucose 257, creatinine 0.93, surgical PCR/MRSA positive.  MRI revealed severe degenerative spondylosis at L3-4 L4-5 with severe stenosis, lateral recess and foraminal degenerative disc disease with facet arthropathy.  She has failed multiple conservative measures.  Underwent L3-5 decompression and transforaminal lumbar body fusion 03/14/2022 per Dr. TPieter PartridgeDawley.  Back brace when out of bed.  May remove to shower..  Patient was cleared to begin subcutaneous heparin for DVT prophylaxis 03/19/2022.  Patient is completing course of Bactroban for MRSA PCR screening positive.  Therapy evaluations completed due to patient's decreased functional mobility was admitted for  a comprehensive rehab program. Her right sided leg numbness has improved. Patient transferred to CIR on 03/20/2022 .    Pt presents with decreased activity tolerance, decreased functional mobility, decreased balance, feelings of stress Limiting pt's independence with leisure/community pursuits.  Met with pt today to discuss TR services including leisure education, activity analysis/modifications and stress management.  Also discussed the importance of social, emotional, spiritual health in addition to physical health and their effects on overall health and wellness.  Pt stated understanding.  Pt scheduled for stress management/coping group  later this afternoon.  Plan  Pt will participate in stress management/coping group >45 minutes Recommendations for other services: None   Discharge Criteria: Patient will be discharged from TR if patient refuses treatment 3 consecutive times without medical reason.  If treatment goals not met, if there is a change in medical status, if patient makes no progress towards goals or if patient is discharged from hospital.  The above assessment, treatment plan, treatment alternatives and goals were discussed and mutually agreed upon: by patient [place] 03/28/2022, 10:27 AM

## 2022-03-28 NOTE — Group Note (Signed)
Patient Details Name: Tammy Pineda MRN: [id number] DOB: 1945-06-10 Today's Date: 03/28/2022  Time Calculation: OT Group Time Calculation OT Group Start Time: [id number] OT Group Stop Time: 1525 OT Group Time Calculation (min): 60 min      Group Description: Stress management: Pt participated in group session with a focus on stress mgmt, education provided on healthy coping strategies, and social interaction. Focus of session on providing coping strategies to manage new diagnosis to allow for improved mental health to increase overall quality of life . Discussed how to break down stressors into "daily hassles," "major life stressors" and "life circumstances" in an effort to allow pts to chunk their stressors into groups and determine where to best put their efforts/time when dealing with stress. Provided active listening, emotional support and therapeutic use of self. Offered education on factors that protect [place] against stress such as "daily uplifts," "healthy coping strategies" and "protective factors." Encouraged all group members to make an effort to actively recall one event from their day that was a daily uplift in an effort to protect their mindset from stressors as well as sharing this information with their caregivers to facilitate improved caregiver communication and decrease overall burden of care.  Issued pt handouts on healthy coping strategies to implement into routine.   Individual level documentation: Patient participated with full collaboration during session.   Pain: Pain Assessment Pain Score: 0-No pain  Precautions: Precautions: Back, Other (comment)  Precious Haws 03/28/2022, 3:57 PM

## 2022-03-28 NOTE — Plan of Care (Signed)

## 2022-03-28 NOTE — Progress Notes (Signed)
Physical Therapy Discharge Summary  Patient Details  Name: Tammy Pineda MRN: [id number] Date of Birth: [birth year]  Date of Discharge from PT service:March 28, 2022  Today's Date: 03/28/2022 PT Individual Time: 1000-1115 PT Individual Time Calculation (min): 75 min    Patient has met 8 of 8 long term goals due to improved activity tolerance, improved balance, improved postural control, increased strength, decreased pain, and ability to compensate for deficits.  Patient to discharge at an ambulatory level Modified Independent.   Patient's care partner is independent to provide the necessary physical assistance at discharge.  Reasons goals not met: NA  Recommendation:  Patient will benefit from ongoing skilled PT services in outpatient setting to continue to advance safe functional mobility, address ongoing impairments in strength, balance, gait mechanics, and minimize fall risk.  Equipment: RW  Reasons for discharge: treatment goals met and discharge from hospital  Patient/family agrees with progress made and goals achieved: Yes  Skilled Therapeutic Interventions/Progress Updates:  Pt received in recliner and agreeable to therapy. Pt reports pain controlled on medication at this time. Session focused on d/c assessments as documented below. Pt performed all mobility at mod I level except stair navigation which required close supervision-CGA. Discussed pt will benefit from her husband being present for stair management and using lateral technique. MMT in sitting as documented below. Pt's RW was delivered, so therapist adjusted to appropriate height. Discussed getting skis for improved slide on carpet in pt's home. Pt returned to room and used bathroom at mod I level. Pt made mod I in the room at end of session, was left in recliner with all needs in reach.    PT Discharge Precautions/Restrictions Precautions Precautions: Back;Other (comment) Precaution Comments: spine precautions,  lumbar corset when OOB Restrictions Weight Bearing Restrictions: No  Pain Pain Assessment Pain Score: 0-No pain Pain Interference Pain Interference Pain Effect on Sleep: 3. Frequently Pain Interference with Therapy Activities: 1. Rarely or not at all Pain Interference with Day-to-Day Activities: 1. Rarely or not at all Vision/Perception  Vision - History Ability to See in Adequate Light: 0 Adequate Perception Perception: Within Functional Limits Praxis Praxis: Intact  Cognition Overall Cognitive Status: Within Functional Limits for tasks assessed Arousal/Alertness: Awake/alert Attention: Focused;Sustained Focused Attention: Appears intact Sustained Attention: Appears intact Memory: Appears intact Awareness: Appears intact Problem Solving: Appears intact Safety/Judgment: Appears intact Sensation Sensation Light Touch: Impaired Detail Peripheral sensation comments: R lateral thigh numbness greatly improved, tinglinging L lateral thigh improved Light Touch Impaired Details: Impaired RLE;Impaired LLE Hot/Cold: Appears Intact Proprioception: Appears Intact Stereognosis: Appears Intact Coordination Gross Motor Movements are Fluid and Coordinated: Yes Fine Motor Movements are Fluid and Coordinated: Yes Coordination and Movement Description: GM movements significantly improved Finger Nose Finger Test: WNL's Motor  Motor Motor - Skilled Clinical Observations: generalized weakness and acute on chronic pain Motor - Discharge Observations: Greatly improved, with some weaknes in BLE but pt able to compensate with rest breaks and RW  Mobility Bed Mobility Bed Mobility: Supine to Sit;Sit to Supine Supine to Sit: Independent with assistive device Sit to Supine: Independent with assistive device Transfers Transfers: Sit to Stand;Stand to Sit;Stand Pivot Transfers Sit to Stand: Independent with assistive device Stand to Sit: Independent with assistive device Stand Pivot  Transfers: Independent with assistive device Transfer (Assistive device): Rolling walker Locomotion  Gait Ambulation: Yes Gait Assistance: Independent with assistive device Gait Distance (Feet): 250 Feet Assistive device: Rolling walker Gait Gait: Yes Gait Pattern: Impaired (occasional foot clearance issues, pt can self correct)  Gait Pattern: Step-through pattern Stairs / Additional Locomotion Stairs: Yes Stairs Assistance: Supervision/Verbal cueing Stair Management Technique: Two rails Number of Stairs: 8 Height of Stairs: 6 Ramp: Independent with assistive device Curb: Independent with assistive device Wheelchair Mobility Wheelchair Mobility: No  Trunk/Postural Assessment  Cervical Assessment Cervical Assessment: Within Functional Limits Thoracic Assessment Thoracic Assessment: Within Functional Limits Lumbar Assessment Lumbar Assessment: Exceptions to [facility] Postural Control Postural Control: Within Functional Limits  Balance Balance Balance Assessed: Yes Static Sitting Balance Static Sitting - Balance Support: Feet supported Static Sitting - Level of Assistance: 6: Modified independent (Device/Increase time) Dynamic Sitting Balance Dynamic Sitting - Balance Support: During functional activity Dynamic Sitting - Level of Assistance: 6: Modified independent (Device/Increase time) Static Standing Balance Static Standing - Balance Support: During functional activity;Bilateral upper extremity supported Static Standing - Level of Assistance: 6: Modified independent (Device/Increase time) Dynamic Standing Balance Dynamic Standing - Balance Support: During functional activity;Bilateral upper extremity supported Dynamic Standing - Level of Assistance: 6: Modified independent (Device/Increase time) Extremity Assessment      RLE Assessment RLE Assessment: Exceptions to [facility] General Strength Comments: assessed in sitting RLE Strength Right Hip Flexion: 4-/5 Right Knee  Flexion: 4/5 Right Knee Extension: 4+/5 Right Ankle Dorsiflexion: 4-/5 Right Ankle Plantar Flexion: 4/5 LLE Assessment LLE Assessment: Exceptions to [facility] General Strength Comments: assessed in sitting LLE Strength Left Knee Flexion: 4/5 Left Knee Extension: 4+/5 Left Ankle Dorsiflexion: 4+/5 Left Ankle Plantar Flexion: 4+/5   [place] Welcome C Ashlley Booher 03/28/2022, 3:33 PM

## 2022-03-28 NOTE — Progress Notes (Signed)
Patient ID: Tammy Pineda, female   DOB: 04-16-1945, 77 y.o.   MRN: [id number]  SW ordered RW with [employer] via parachute.   SW faxed PT/OT referral to [facility] (p:[phone]/f:[phone] or [phone]).  *SW later informed by OT, outpatient OT no longer needed.   SW met with pt in room to inform on above. Confirms she will have assistance with transportation to/from outpatient. No questions/concerns reported.   Loralee Pacas, MSW, [place] Office: [phone] Cell: [phone] Fax: [phone]

## 2022-03-28 NOTE — Progress Notes (Signed)
Occupational Therapy Discharge Summary  Patient Details  Name: Tammy Pineda MRN: [id number] Date of Birth: Jul 16, 1945  Date of Discharge from OT service:March 28, 2022  Today's Date: 03/28/2022 OT Individual Time: 1317-1400 OT Individual Time Calculation (min): 43 min    Patient has met 8 of 8 long term goals due to improved activity tolerance, improved balance, postural control, and ability to compensate for deficits.  Patient to discharge at overall Modified Independent level.  Patient's care partner is independent to provide the necessary physical assistance at discharge.    Reasons goals not met: n/a  Recommendation:  Patient will not require further skilled OT post CIR d/c.   Equipment: LH sponge   Reasons for discharge: treatment goals met  Patient/family agrees with progress made and goals achieved: Yes  OT Discharge Precautions/Restrictions  Precautions Precautions: Back;Other (comment) Precaution Comments: spine precautions, lumbar corset when OOB Restrictions Weight Bearing Restrictions: No General   Vital Signs Therapy Vitals Temp: (!) 97.5 F (36.4 C) Pulse Rate: (!) 58 Resp: 15 BP: (!) 125/46 Patient Position (if appropriate): Lying Oxygen Therapy SpO2: 99 % O2 Device: Room Air Pain Pain Assessment Pain Score: 0-No pain ADL ADL Equipment Provided: Reacher, Long-handled sponge, Long-handled shoe horn Eating: Independent Where Assessed-Eating: Chair Grooming: Independent Where Assessed-Grooming: Standing at sink Upper Body Bathing: Modified independent Where Assessed-Upper Body Bathing: Shower Lower Body Bathing: Modified independent Where Assessed-Lower Body Bathing: Shower Upper Body Dressing: Modified independent (Device) Where Assessed-Upper Body Dressing: Chair Lower Body Dressing: Modified independent Where Assessed-Lower Body Dressing: Sitting at sink, Standing at sink Toileting: Modified independent Where Assessed-Toileting:  Glass blower/designer: Modified Programmer, applications Method: Arts development officer: Energy manager: Chief Financial Officer Method: Heritage manager: Civil engineer, contracting with back ADL Comments: Pt mod I for all self care including shower, dressing, grooming and toileting with RW and AE for LB reach following all back precautions with LSO brace in place. Vision Baseline Vision/History: 1 Wears glasses Patient Visual Report: No change from baseline Vision Assessment?: No apparent visual deficits Perception  Perception: Within Functional Limits Praxis Praxis: Intact Cognition Cognition Overall Cognitive Status: Within Functional Limits for tasks assessed Arousal/Alertness: Awake/alert Orientation Level: Person;Place;Situation Person: Oriented Place: Oriented Situation: Oriented Memory: Appears intact Attention: Focused;Sustained Focused Attention: Appears intact Sustained Attention: Appears intact Awareness: Appears intact Problem Solving: Appears intact Safety/Judgment: Appears intact Brief Interview for Mental Status (BIMS) Repetition of Three Words (First Attempt): 3 Temporal Orientation: Year: Correct Temporal Orientation: Month: Accurate within 5 days Temporal Orientation: Day: Correct Recall: "Sock": Yes, no cue required Recall: "Blue": Yes, no cue required Recall: "Bed": Yes, no cue required BIMS Summary Score: 15 Sensation Sensation Light Touch: Impaired Detail Peripheral sensation comments: R lateral thigh numbness greatly improved, tinglinging L lateral thigh improved Light Touch Impaired Details: Impaired RLE;Impaired LLE Hot/Cold: Appears Intact Proprioception: Appears Intact Stereognosis: Appears Intact Coordination Gross Motor Movements are Fluid and Coordinated: Yes Fine Motor Movements are Fluid and Coordinated: Yes Coordination and Movement Description: GM movements  significantly improved Finger Nose Finger Test: WNL's Motor  Motor Motor - Skilled Clinical Observations: generalized weakness and acute on chronic pain Motor - Discharge Observations: Greatly improved, with some weaknes in BLE but pt able to compensate with rest breaks and RW Mobility  Bed Mobility Bed Mobility: Supine to Sit;Sit to Supine Supine to Sit: Independent with assistive device Sit to Supine: Independent with assistive device Transfers Sit to Stand: Independent with assistive device Stand to Sit:  Independent with assistive device  Trunk/Postural Assessment  Cervical Assessment Cervical Assessment: Within Functional Limits Thoracic Assessment Thoracic Assessment: Within Functional Limits Lumbar Assessment Lumbar Assessment: Exceptions to [facility] Postural Control Postural Control: Within Functional Limits  Balance Balance Balance Assessed: Yes Static Sitting Balance Static Sitting - Balance Support: Feet supported Static Sitting - Level of Assistance: 6: Modified independent (Device/Increase time) Dynamic Sitting Balance Dynamic Sitting - Balance Support: During functional activity Dynamic Sitting - Level of Assistance: 6: Modified independent (Device/Increase time) Static Standing Balance Static Standing - Balance Support: During functional activity;Bilateral upper extremity supported Static Standing - Level of Assistance: 6: Modified independent (Device/Increase time) Dynamic Standing Balance Dynamic Standing - Balance Support: During functional activity;Bilateral upper extremity supported Dynamic Standing - Level of Assistance: 6: Modified independent (Device/Increase time) Extremity/Trunk Assessment RUE Assessment RUE Assessment: Within Functional Limits LUE Assessment LUE Assessment: Within Functional Limits  OT Intervention and Training:   Pt seen for final individual OT session in preparation for discharge. Pt has group OT later day. Pt has met all S/LTG's  for OT and is now mod I for all functional ADL's and bathroom transfers with RW, DME and AE. Pt has been issued a LH sponge and has her own reacher and [facility] and is indep with use. Pt indep with LSO brace as well. Pt can amb in demo apt space and retrieve light items and transport along counter tops amb level with mod I. Pt requires assist only for TED hose. OT completed full shower trng and grooming standing level and EOB dressing this session. Pt able to teach back all spinal precautions. No further OT needs and pt handed off to therapy tech for transport to group therapy session.   Barnabas Lister 03/28/2022, 4:19 PM

## 2022-03-28 NOTE — Progress Notes (Signed)
PROGRESS NOTE   Subjective/Complaints: Reports she is having symptoms of yeast infection. She has chronic R shoulder pain- voltaren gel has helped in past. She had L leg pain this AM-improved with muscle relaxer.   ROS: Patient denies fever, rash, sore throat, blurred vision, dizziness, nausea, vomiting, diarrhea, cough, shortness of breath or chest pain,  headache, or mood change.  Dysuria-Improved  Objective:   VAS [place] LOWER EXTREMITY VENOUS (DVT)  Result Date: 03/27/2022  Lower Venous DVT Study Patient Name:  MAYLEN HAMMAN [facility]  Date of Exam:   03/27/2022 Medical Rec #: [id number]        Accession #:    [id number] Date of Birth: 04-Mar-1945        Patient Gender: F Patient Age:   1 years Exam Location:  [facility] Procedure:      VAS [place] LOWER EXTREMITY VENOUS (DVT) Referring Phys: Lauraine Rinne --------------------------------------------------------------------------------  Indications: Swelling, and follow up DVT scan.  Risk Factors: Surgery Back surgery 03/14/22. Anticoagulation: Lovenox. Comparison Study: Prior study 03/20/22 positive Performing Technologist: McKayla Maag  Examination Guidelines: A complete evaluation includes B-mode imaging, spectral Doppler, color Doppler, and power Doppler as needed of all accessible portions of each vessel. Bilateral testing is considered an integral part of a complete examination. Limited examinations for reoccurring indications may be performed as noted. The reflux portion of the exam is performed with the patient in reverse Trendelenburg.  +---------+---------------+---------+-----------+----------+--------------+ RIGHT    CompressibilityPhasicitySpontaneityPropertiesThrombus Aging +---------+---------------+---------+-----------+----------+--------------+ CFV      Full           Yes      Yes                                  +---------+---------------+---------+-----------+----------+--------------+ SFJ      Full                                                        +---------+---------------+---------+-----------+----------+--------------+ FV Prox  Full                                                        +---------+---------------+---------+-----------+----------+--------------+ FV Mid   Full                                                        +---------+---------------+---------+-----------+----------+--------------+ FV DistalFull                                                        +---------+---------------+---------+-----------+----------+--------------+  PFV      Full                                                        +---------+---------------+---------+-----------+----------+--------------+ POP      Full           Yes      Yes                                 +---------+---------------+---------+-----------+----------+--------------+ PTV      Full                                                        +---------+---------------+---------+-----------+----------+--------------+ PERO     Partial        Yes      Yes                  Acute          +---------+---------------+---------+-----------+----------+--------------+   +----+---------------+---------+-----------+----------+--------------+ LEFTCompressibilityPhasicitySpontaneityPropertiesThrombus Aging +----+---------------+---------+-----------+----------+--------------+ CFV Full           Yes      Yes                                 +----+---------------+---------+-----------+----------+--------------+ SFJ Full                                                        +----+---------------+---------+-----------+----------+--------------+    Summary: RIGHT: - Findings consistent with acute deep vein thrombosis involving the right peroneal veins. - Findings appear essentially unchanged compared  to previous examination. - No cystic structure found in the popliteal fossa.  LEFT: - No evidence of common femoral vein obstruction.  *See table(s) above for measurements and observations. Electronically signed by Jamelle Haring on 03/27/2022 at 10:06:03 PM.    Final    No results for input(s): "WBC", "HGB", "HCT", "PLT" in the last 72 hours.   No results for input(s): "NA", "K", "CL", "CO2", "GLUCOSE", "BUN", "CREATININE", "CALCIUM" in the last 72 hours.   Intake/Output Summary (Last 24 hours) at 03/28/2022 1652 Last data filed at 03/28/2022 0726 Gross per 24 hour  Intake 220 ml  Output --  Net 220 ml         Physical Exam: Vital Signs Blood pressure (!) 125/46, pulse (!) 58, temperature (!) 97.5 F (36.4 C), resp. rate 15, height 5' 5"$  (1.651 m), weight 79.1 kg, SpO2 99 %. Constitutional: No distress . Vital signs reviewed. Sitting in chair- appears comfortable HEENT: NCAT, conjugate gaze,  oral membranes moist Neck: supple Cardiovascular: RRR without murmur. No JVD    Respiratory/Chest: CTA Bilaterally without wheezes or rales. GI/Abdomen: BS +, non-tender, non-distended, soft Ext: no clubbing, cyanosis, or edema Psych: pleasant and cooperative  Neurological: Ox3- Skin:    Comments: back incision cdi with honeycomb dressing in place  Neurological:     Comments: Patient is  alert and oriented x 3.  Follows commands. 4/5 strength throughout. Sensation remains decreased on left lateral thigh.  Assessment/Plan: 1. Functional deficits which require 3+ hours per day of interdisciplinary therapy in a comprehensive inpatient rehab setting. Physiatrist is providing close team supervision and 24 hour management of active medical problems listed below. Physiatrist and rehab team continue to assess barriers to discharge/monitor patient progress toward functional and medical goals  Care Tool:  Bathing    Body parts bathed by patient: Right arm, Left arm, Chest, Abdomen, Front perineal  area, Buttocks, Right upper leg, Left upper leg, Right lower leg, Left lower leg, Face   Body parts bathed by helper: Buttocks, Right lower leg, Left lower leg     Bathing assist Assist Level: Independent with assistive device     Upper Body Dressing/Undressing Upper body dressing   What is the patient wearing?: Pull over shirt, Orthosis    Upper body assist Assist Level: Independent with assistive device    Lower Body Dressing/Undressing Lower body dressing      What is the patient wearing?: Underwear/pull up, Pants     Lower body assist Assist for lower body dressing: Independent with assitive device     Toileting Toileting    Toileting assist Assist for toileting: Independent with assistive device     Transfers Chair/bed transfer  Transfers assist     Chair/bed transfer assist level: Independent with assistive device Chair/bed transfer assistive device: Programmer, multimedia   Ambulation assist      Assist level: Independent with assistive device Assistive device: Walker-rolling Max distance: 250   Walk 10 feet activity   Assist     Assist level: Independent with assistive device Assistive device: Walker-rolling   Walk 50 feet activity   Assist    Assist level: Independent with assistive device Assistive device: Walker-rolling    Walk 150 feet activity   Assist Walk 150 feet activity did not occur: Safety/medical concerns  Assist level: Independent with assistive device Assistive device: Walker-rolling    Walk 10 feet on uneven surface  activity   Assist Walk 10 feet on uneven surfaces activity did not occur: Safety/medical concerns   Assist level: Independent with assistive device Assistive device: Walker-rolling   Wheelchair     Assist Is the patient using a wheelchair?: No Type of Wheelchair: Manual    Wheelchair assist level: Dependent - Patient 0%      Wheelchair 50 feet with 2 turns  activity    Assist        Assist Level: Dependent - Patient 0%   Wheelchair 150 feet activity     Assist      Assist Level: Dependent - Patient 0%   Blood pressure (!) 125/46, pulse (!) 58, temperature (!) 97.5 F (36.4 C), resp. rate 15, height 5' 5"$  (1.651 m), weight 79.1 kg, SpO2 99 %.  Medical Problem List and Plan: 1. Functional deficits secondary to lumbar stenosis/radiculopathy.  Status post L3-5 decompression and transforaminal lumbar body fusion 03/14/2022.  Back brace when out of bed.  May remove to shower.             -patient may shower             -ELOS/Goals: 1 week             -Continue CIR therapies including PT, OT   -Estimated discharge date 03/29/22  2.  Antithrombotics: -DVT/anticoagulation: Subcutaneous heparin.  Check vascular study  - DVT R peroneal-  Lovenox 84m qd after speaking with Dr DReatha Armour    -2/19 recheck doppler this week ~ 2/22  2/22 DVT unchanged- asymptomatic, recheck with PCP as outpatient             -antiplatelet therapy: N/A 3. Pain Management: continue Robaxin 500 mg every 6 hours as needed muscle spasms, oxycodone/tramadol as needed. Kpad ordered.   2/15- pain controlled when takes oxy 10 mg, but is very painful per pt  2/21 voltaren gel R shoulder 4. Anxiety: continue Lexapro 10 mg nightly, Xanax 0.25 mg bedtime as needed             -antipsychotic agents: N/A 5. Neuropsych/cognition: This patient is capable of making decisions on her own behalf. 6. Skin/Wound Care: Routine skin checks 7. Fluids/Electrolytes/Nutrition: Routine in and outs with follow-up chemistries 8.  MRSA PCR screening positive.  Completed course of Bactroban 9.  Diabetes mellitus.  Continue Glucotrol 2.5 mg twice daily.  2/15- CBGs 75 to 207- will give 24 hours and determine if need to upgrade meds  2/22 well controlled, continue current   CBG (last 3)  Recent Labs    03/28/22 0559 03/28/22 1203 03/28/22 1601  GLUCAP 130* 124* 123*     10.   Hypertension.  HCTZ 12.5 mg daily, Cozaar 50 mg nightly, monitor with increased mobility  2/21, decrease cozaar to 232m 2/22 monitor response to medication change     03/28/2022    3:52 PM 03/28/2022    4:02 AM 03/27/2022    7:26 PM  Vitals with BMI  Systolic [id number] 46 59 50  Pulse 58 66 65    11.  GERD.  Continue Protonix 12.  Hyperlipidemia.  Continue Crestor 13.  History of CAD.  Nonobstructive by cardiac catheterization 2011.  Will discuss with neurosurgery when to resume low-dose aspirin 14.  Constipation.  Continue Colace 100 mg twice daily, Dulcolax tablet daily as needed  2/15- LBM 7+ days ago- will give Sorbitol 30cc x1 after therapy- might end up needing enema?  2/16- No results- will give Miralax and prune juice- also 60cc Sorbitol at noon and f/u with soap suds enema at 4pm- if no results, have nursing call me and wil do Mg citrate  -LBM 2/20 15.  OSA.  continue CPAP  16. Hyponatremia  2/15- will recheck Monday since Na 131  2/19- Na+ trending up, now 134 today 17. Hypokalemia  2/15- will replete due to K+ of 3.2- 40 mEq x2 and recheck in AM  2/19- K+ 4.1, not currently on supplement 18. Moderate malnutrition- Albumin 2.6-  19. Urinary urgency/dysuria 2/19- UTI  -check UA/urine culture  -Keflex was started for UTI  -2/22 symptoms improved, does not appear urine culture was completed?, repeat culture if symptoms return 20. Vaginal yeast infection  -2/21 She says diflucan has worked best for her in past. 15049miflucan ordered  -2/22 symptoms improving  LOS: 8 days A FACE TO FACE EVALUATION WAS PERFORMED  YurJennye Boroughs22/2024, 4:52 PM

## 2022-03-28 NOTE — Progress Notes (Signed)
Provided UTI and Keflex education to binder. Working with therapy. Patient endorses frequent voiding. C&S pending. Therapy making patient Mod I in room today. Patient educated to call after next void for bladder scan.

## 2022-03-28 NOTE — Group Note (Signed)
Patient Details Name: Tammy Pineda MRN: [id number] DOB: August 10, 1945 Today's Date: 03/28/2022     Group Description: Stress management: Pt participated in group session with a focus on stress mgmt, education provided on healthy coping strategies, and social interaction. Focus of session on providing coping strategies to manage new diagnosis to allow for improved mental health to increase overall quality of life . Discussed how to break down stressors into "daily hassles," "major life stressors" and "life circumstances" in an effort to allow pts to chunk their stressors into groups and determine where to best put their efforts/time when dealing with stress. Provided active listening, emotional support and therapeutic use of self. Offered education on factors that protect [place] against stress such as "daily uplifts," "healthy coping strategies" and "protective factors." Encouraged all group members to make an effort to actively recall one event from their day that was a daily uplift in an effort to protect their mindset from stressors as well as sharing this information with their caregivers to facilitate improved caregiver communication and decrease overall burden of care.  Issued pt handouts on healthy coping strategies to implement into routine.   Individual level documentation: Patient participated with full collaboration during session.   Pain:no c/o Pain Assessment Pain Score: 0-No pain    [name] 03/28/2022, 3:58 PM

## 2022-03-29 LAB — GLUCOSE, CAPILLARY: Glucose-Capillary: 128 mg/dL — ABNORMAL HIGH (ref 70–99)

## 2022-03-29 NOTE — Progress Notes (Signed)
Discharge instructions provided by PA ([place]). Staff assisted patient off the unit. Patient discharged from CIR via private care; accompanied by spouse.   Yehuda Mao, LPN

## 2022-03-29 NOTE — Progress Notes (Signed)
Recreational Therapy Discharge Summary Patient Details  Name: Tammy Pineda MRN: [id number] Date of Birth: 1945/09/07 Today's Date: 03/29/2022   Comments on progress toward goals: Pt has made good progress during LOS and is discharging home today at Mod I level.  TR sessions focused on pt education in regards to leisure education, activity analysis/modifications and stress management/ coping.  Pt is anxious to return home and return to previously enjoyed activities as able. Reasons for discharge: discharge from hospital  Follow-up: Outpatient  Patient/family agrees with progress made and goals achieved: Yes  Daegon Deiss 03/29/2022, 8:16 AM

## 2022-03-29 NOTE — Progress Notes (Signed)
CPAP at bedside, patient will self place when ready.  

## 2022-03-29 NOTE — Progress Notes (Signed)
Nursing education and nursing care plan completed for discharge.

## 2022-03-29 NOTE — Progress Notes (Signed)
Inpatient Rehabilitation Care Coordinator Discharge Note   Patient Details  Name: Tammy Pineda MRN: [id number] Date of Birth: 12/04/1945   Discharge location: D/c to home  Length of Stay: 8 days  Discharge activity level: Mod I  Home/community participation: Limited  Patient response [id number] Literacy - How often do you need to have someone help you when you read instructions, pamphlets, or other written material from your doctor or pharmacy?: Never  Patient response [id number] Isolation - How often do you feel lonely or isolated from those around you?: Never  Services provided included: MD, RD, PT, OT, RN, TR, Pharmacy, Neuropsych, SW, CM  Financial Services:  Charity fundraiser Utilized: Medicare    Choices offered to/list presented to: pt  Follow-up services arranged:  Outpatient, DME    Outpatient Servicies: [facility] for PT DME : [facility] for RW    Patient response to transportation need: Is the patient able to respond to transportation needs?: Yes In the past 12 months, has lack of transportation kept you from medical appointments or from getting medications?: No In the past 12 months, has lack of transportation kept you from meetings, work, or from getting things needed for daily living?: No   Comments (or additional information):  Patient/Family verbalized understanding of follow-up arrangements:  Yes  Individual responsible for coordination of the follow-up plan: contact pt [phone]  Confirmed correct DME delivered: Rana Snare 03/29/2022    Rana Snare

## 2022-03-29 NOTE — Progress Notes (Signed)
PROGRESS NOTE   Subjective/Complaints: No new concerns this AM. She is looking forward to going home  ROS: Patient denies fever, malaise,  rash, sore throat, blurred vision, dizziness, nausea, vomiting, diarrhea, cough, shortness of breath or chest pain,  headache, or mood change.  Dysuria-Improved  Objective:   VAS [place] LOWER EXTREMITY VENOUS (DVT)  Result Date: 03/27/2022  Lower Venous DVT Study Patient Name:  NATSUKO DOWNES [facility]  Date of Exam:   03/27/2022 Medical Rec #: [id number]        Accession #:    [id number] Date of Birth: Oct 19, 1945        Patient Gender: F Patient Age:   77 years Exam Location:  [facility] Procedure:      VAS [place] LOWER EXTREMITY VENOUS (DVT) Referring Phys: Lauraine Rinne --------------------------------------------------------------------------------  Indications: Swelling, and follow up DVT scan.  Risk Factors: Surgery Back surgery 03/14/22. Anticoagulation: Lovenox. Comparison Study: Prior study 03/20/22 positive Performing Technologist: McKayla Maag  Examination Guidelines: A complete evaluation includes B-mode imaging, spectral Doppler, color Doppler, and power Doppler as needed of all accessible portions of each vessel. Bilateral testing is considered an integral part of a complete examination. Limited examinations for reoccurring indications may be performed as noted. The reflux portion of the exam is performed with the patient in reverse Trendelenburg.  +---------+---------------+---------+-----------+----------+--------------+ RIGHT    CompressibilityPhasicitySpontaneityPropertiesThrombus Aging +---------+---------------+---------+-----------+----------+--------------+ CFV      Full           Yes      Yes                                 +---------+---------------+---------+-----------+----------+--------------+ SFJ      Full                                                         +---------+---------------+---------+-----------+----------+--------------+ FV Prox  Full                                                        +---------+---------------+---------+-----------+----------+--------------+ FV Mid   Full                                                        +---------+---------------+---------+-----------+----------+--------------+ FV DistalFull                                                        +---------+---------------+---------+-----------+----------+--------------+ PFV      Full                                                        +---------+---------------+---------+-----------+----------+--------------+  POP      Full           Yes      Yes                                 +---------+---------------+---------+-----------+----------+--------------+ PTV      Full                                                        +---------+---------------+---------+-----------+----------+--------------+ PERO     Partial        Yes      Yes                  Acute          +---------+---------------+---------+-----------+----------+--------------+   +----+---------------+---------+-----------+----------+--------------+ LEFTCompressibilityPhasicitySpontaneityPropertiesThrombus Aging +----+---------------+---------+-----------+----------+--------------+ CFV Full           Yes      Yes                                 +----+---------------+---------+-----------+----------+--------------+ SFJ Full                                                        +----+---------------+---------+-----------+----------+--------------+    Summary: RIGHT: - Findings consistent with acute deep vein thrombosis involving the right peroneal veins. - Findings appear essentially unchanged compared to previous examination. - No cystic structure found in the popliteal fossa.  LEFT: - No evidence of common femoral vein obstruction.  *See table(s)  above for measurements and observations. Electronically signed by Jamelle Haring on 03/27/2022 at 10:06:03 PM.    Final    No results for input(s): "WBC", "HGB", "HCT", "PLT" in the last 72 hours.   No results for input(s): "NA", "K", "CL", "CO2", "GLUCOSE", "BUN", "CREATININE", "CALCIUM" in the last 72 hours.   Intake/Output Summary (Last 24 hours) at 03/29/2022 0825 Last data filed at 03/29/2022 0709 Gross per 24 hour  Intake 460 ml  Output --  Net 460 ml         Physical Exam: Vital Signs Blood pressure (!) 124/50, pulse 70, temperature 98.1 F (36.7 C), resp. rate 17, height '5\' 5"'$  (1.651 m), weight 79.1 kg, SpO2 97 %. Constitutional: No distress . Vital signs reviewed. Sitting in chair- appears comfortable HEENT: NCAT, EOMI,   oral membranes moist Neck: supple Cardiovascular: RRR without murmur. No JVD    Respiratory/Chest: CTA Bilaterally without wheezes or rales. GI/Abdomen: BS +, non-tender, non-distended, soft Ext: no clubbing, cyanosis, or edema Psych: pleasant and cooperative      Comments: back incision cdi with honeycomb dressing in place  Neurological:     Comments: Patient is alert and oriented x 3.  memory appears to be intact, Follows commands. 4/5 strength throughout. Sensation remains decreased on left lateral thigh.  Assessment/Plan: 1. Functional deficits which require 3+ hours per day of interdisciplinary therapy in a comprehensive inpatient rehab setting. Physiatrist is providing close team supervision and 24 hour management of active medical problems listed below. Physiatrist and rehab team continue to assess barriers  to discharge/monitor patient progress toward functional and medical goals  Care Tool:  Bathing    Body parts bathed by patient: Right arm, Left arm, Chest, Abdomen, Front perineal area, Buttocks, Right upper leg, Left upper leg, Right lower leg, Left lower leg, Face   Body parts bathed by helper: Buttocks, Right lower leg, Left lower leg      Bathing assist Assist Level: Independent with assistive device     Upper Body Dressing/Undressing Upper body dressing   What is the patient wearing?: Pull over shirt, Orthosis    Upper body assist Assist Level: Independent with assistive device    Lower Body Dressing/Undressing Lower body dressing      What is the patient wearing?: Underwear/pull up, Pants     Lower body assist Assist for lower body dressing: Independent with assitive device     Toileting Toileting    Toileting assist Assist for toileting: Independent with assistive device     Transfers Chair/bed transfer  Transfers assist     Chair/bed transfer assist level: Independent with assistive device Chair/bed transfer assistive device: Programmer, multimedia   Ambulation assist      Assist level: Independent with assistive device Assistive device: Walker-rolling Max distance: 250   Walk 10 feet activity   Assist     Assist level: Independent with assistive device Assistive device: Walker-rolling   Walk 50 feet activity   Assist    Assist level: Independent with assistive device Assistive device: Walker-rolling    Walk 150 feet activity   Assist Walk 150 feet activity did not occur: Safety/medical concerns  Assist level: Independent with assistive device Assistive device: Walker-rolling    Walk 10 feet on uneven surface  activity   Assist Walk 10 feet on uneven surfaces activity did not occur: Safety/medical concerns   Assist level: Independent with assistive device Assistive device: Walker-rolling   Wheelchair     Assist Is the patient using a wheelchair?: No Type of Wheelchair: Manual    Wheelchair assist level: Dependent - Patient 0%      Wheelchair 50 feet with 2 turns activity    Assist        Assist Level: Dependent - Patient 0%   Wheelchair 150 feet activity     Assist      Assist Level: Dependent - Patient 0%   Blood  pressure (!) 124/50, pulse 70, temperature 98.1 F (36.7 C), resp. rate 17, height '5\' 5"'$  (1.651 m), weight 79.1 kg, SpO2 97 %.  Medical Problem List and Plan: 1. Functional deficits secondary to lumbar stenosis/radiculopathy.  Status post L3-5 decompression and transforaminal lumbar body fusion 03/14/2022.  Back brace when out of bed.  May remove to shower.             -patient may shower             -ELOS/Goals: 1 week             -Continue CIR therapies including PT, OT   -Estimated discharge date 03/29/22  -DC home today  2.  Antithrombotics: -DVT/anticoagulation: Subcutaneous heparin.  Check vascular study  - DVT R peroneal-   Lovenox '40mg'$  qd after speaking with Dr Reatha Armour-    -2/19 recheck doppler this week ~ 2/22  2/22 DVT unchanged- asymptomatic, recheck with PCP as outpatient- discussed this again today             -antiplatelet therapy: N/A 3. Pain Management: continue Robaxin 500 mg every 6 hours as  needed muscle spasms, oxycodone/tramadol as needed. Kpad ordered.   2/15- pain controlled when takes oxy 10 mg, but is very painful per pt  2/21 voltaren gel R shoulder 4. Anxiety: continue Lexapro 10 mg nightly, Xanax 0.25 mg bedtime as needed             -antipsychotic agents: N/A 5. Neuropsych/cognition: This patient is capable of making decisions on her own behalf. 6. Skin/Wound Care: Routine skin checks 7. Fluids/Electrolytes/Nutrition: Routine in and outs with follow-up chemistries 8.  MRSA PCR screening positive.  Completed course of Bactroban 9.  Diabetes mellitus.  Continue Glucotrol 2.5 mg twice daily.  2/15- CBGs 75 to 207- will give 24 hours and determine if need to upgrade meds  2/23 controlled   CBG (last 3)  Recent Labs    03/28/22 1601 03/28/22 2052 03/29/22 0619  GLUCAP 123* 151* 128*     10.  Hypertension.  HCTZ 12.5 mg daily, Cozaar 50 mg nightly, monitor with increased mobility  2/21, decrease cozaar to '25mg'$   2/23 well controlled, continue current      03/29/2022    5:17 AM 03/28/2022    7:28 PM 03/28/2022    3:52 PM  Vitals with BMI  Systolic [id number]  Diastolic 50 57 46  Pulse 70 66 58    11.  GERD.  Continue Protonix 12.  Hyperlipidemia.  Continue Crestor 13.  History of CAD.  Nonobstructive by cardiac catheterization 2011.  Will discuss with neurosurgery when to resume low-dose aspirin 14.  Constipation.  Continue Colace 100 mg twice daily, Dulcolax tablet daily as needed  2/15- LBM 7+ days ago- will give Sorbitol 30cc x1 after therapy- might end up needing enema?  2/16- No results- will give Miralax and prune juice- also 60cc Sorbitol at noon and f/u with soap suds enema at 4pm- if no results, have nursing call me and wil do Mg citrate  -LBM 2/22 15.  OSA.  continue CPAP  16. Hyponatremia  2/15- will recheck Monday since Na 131  2/19- Na+ trending up, now 134 today 17. Hypokalemia  2/15- will replete due to K+ of 3.2- 40 mEq x2 and recheck in AM  2/19- K+ 4.1, not currently on supplement 18. Moderate malnutrition- Albumin 2.6-  19. Urinary urgency/dysuria 2/19- UTI  -check UA/urine culture  -Keflex was started for UTI  -2/22 symptoms improved, does not appear urine culture was completed?, repeat culture if symptoms return 20. Vaginal yeast infection  -2/21 She says diflucan has worked best for her in past. '150mg'$  diflucan ordered  -2/22 symptoms improving  LOS: 9 days A FACE TO FACE EVALUATION WAS PERFORMED  Jennye Boroughs 03/29/2022, 8:25 AM

## 2022-04-01 DIAGNOSIS — M4316 Spondylolisthesis, lumbar region: Secondary | ICD-10-CM | POA: Diagnosis not present

## 2022-04-08 ENCOUNTER — Other Ambulatory Visit: Payer: Self-pay

## 2022-04-08 DIAGNOSIS — E1165 Type 2 diabetes mellitus with hyperglycemia: Secondary | ICD-10-CM

## 2022-04-08 MED ORDER — ONETOUCH VERIO VI STRP: ORAL_STRIP | 3 refills | Status: DC

## 2022-04-17 DIAGNOSIS — Z6826 Body mass index (BMI) 26.0-26.9, adult: Secondary | ICD-10-CM | POA: Diagnosis not present

## 2022-04-17 DIAGNOSIS — E1122 Type 2 diabetes mellitus with diabetic chronic kidney disease: Secondary | ICD-10-CM | POA: Diagnosis not present

## 2022-04-17 DIAGNOSIS — R300 Dysuria: Secondary | ICD-10-CM | POA: Diagnosis not present

## 2022-04-17 DIAGNOSIS — E11649 Type 2 diabetes mellitus with hypoglycemia without coma: Secondary | ICD-10-CM | POA: Diagnosis not present

## 2022-04-19 ENCOUNTER — Telehealth: Payer: Self-pay

## 2022-04-19 NOTE — Telephone Encounter (Signed)
Unfortunately, it is neither the [place] or the Dexcom is covered by her insurance, the only option is to pay out-of-pocket.  In that case, the Elenor Legato may be cheaper.

## 2022-04-19 NOTE — Telephone Encounter (Signed)
Pt lvm to advise request for Dexcom CGM was denied. Wanted to know what could be done.

## 2022-04-23 DIAGNOSIS — N281 Cyst of kidney, acquired: Secondary | ICD-10-CM | POA: Diagnosis not present

## 2022-04-23 DIAGNOSIS — N3020 Other chronic cystitis without hematuria: Secondary | ICD-10-CM | POA: Diagnosis not present

## 2022-04-23 DIAGNOSIS — N8110 Cystocele, unspecified: Secondary | ICD-10-CM | POA: Diagnosis not present

## 2022-04-23 DIAGNOSIS — N3941 Urge incontinence: Secondary | ICD-10-CM | POA: Diagnosis not present

## 2022-05-15 DIAGNOSIS — E7800 Pure hypercholesterolemia, unspecified: Secondary | ICD-10-CM | POA: Diagnosis not present

## 2022-05-15 DIAGNOSIS — I700 Atherosclerosis of aorta: Secondary | ICD-10-CM | POA: Diagnosis not present

## 2022-05-15 DIAGNOSIS — E559 Vitamin D deficiency, unspecified: Secondary | ICD-10-CM | POA: Diagnosis not present

## 2022-05-15 DIAGNOSIS — E1122 Type 2 diabetes mellitus with diabetic chronic kidney disease: Secondary | ICD-10-CM | POA: Diagnosis not present

## 2022-05-15 DIAGNOSIS — E538 Deficiency of other specified B group vitamins: Secondary | ICD-10-CM | POA: Diagnosis not present

## 2022-05-15 DIAGNOSIS — K580 Irritable bowel syndrome with diarrhea: Secondary | ICD-10-CM | POA: Diagnosis not present

## 2022-05-15 DIAGNOSIS — N183 Chronic kidney disease, stage 3 unspecified: Secondary | ICD-10-CM | POA: Diagnosis not present

## 2022-05-15 DIAGNOSIS — I10 Essential (primary) hypertension: Secondary | ICD-10-CM | POA: Diagnosis not present

## 2022-05-15 DIAGNOSIS — M5451 Vertebrogenic low back pain: Secondary | ICD-10-CM | POA: Diagnosis not present

## 2022-05-21 DIAGNOSIS — L821 Other seborrheic keratosis: Secondary | ICD-10-CM | POA: Diagnosis not present

## 2022-05-21 DIAGNOSIS — Z85828 Personal history of other malignant neoplasm of skin: Secondary | ICD-10-CM | POA: Diagnosis not present

## 2022-05-21 DIAGNOSIS — L578 Other skin changes due to chronic exposure to nonionizing radiation: Secondary | ICD-10-CM | POA: Diagnosis not present

## 2022-05-21 DIAGNOSIS — L570 Actinic keratosis: Secondary | ICD-10-CM | POA: Diagnosis not present

## 2022-05-22 DIAGNOSIS — N3020 Other chronic cystitis without hematuria: Secondary | ICD-10-CM | POA: Diagnosis not present

## 2022-05-22 DIAGNOSIS — K5730 Diverticulosis of large intestine without perforation or abscess without bleeding: Secondary | ICD-10-CM | POA: Diagnosis not present

## 2022-05-22 DIAGNOSIS — N281 Cyst of kidney, acquired: Secondary | ICD-10-CM | POA: Diagnosis not present

## 2022-05-24 DIAGNOSIS — M5451 Vertebrogenic low back pain: Secondary | ICD-10-CM | POA: Diagnosis not present

## 2022-05-28 DIAGNOSIS — M48061 Spinal stenosis, lumbar region without neurogenic claudication: Secondary | ICD-10-CM | POA: Diagnosis not present

## 2022-05-28 DIAGNOSIS — Z6828 Body mass index (BMI) 28.0-28.9, adult: Secondary | ICD-10-CM | POA: Diagnosis not present

## 2022-05-28 DIAGNOSIS — E663 Overweight: Secondary | ICD-10-CM | POA: Diagnosis not present

## 2022-05-28 DIAGNOSIS — M1991 Primary osteoarthritis, unspecified site: Secondary | ICD-10-CM | POA: Diagnosis not present

## 2022-05-28 DIAGNOSIS — Z79899 Other long term (current) drug therapy: Secondary | ICD-10-CM | POA: Diagnosis not present

## 2022-05-28 DIAGNOSIS — K760 Fatty (change of) liver, not elsewhere classified: Secondary | ICD-10-CM | POA: Diagnosis not present

## 2022-05-28 DIAGNOSIS — M0609 Rheumatoid arthritis without rheumatoid factor, multiple sites: Secondary | ICD-10-CM | POA: Diagnosis not present

## 2022-05-29 DIAGNOSIS — M5451 Vertebrogenic low back pain: Secondary | ICD-10-CM | POA: Diagnosis not present

## 2022-05-31 DIAGNOSIS — M5451 Vertebrogenic low back pain: Secondary | ICD-10-CM | POA: Diagnosis not present

## 2022-06-04 DIAGNOSIS — N8110 Cystocele, unspecified: Secondary | ICD-10-CM | POA: Diagnosis not present

## 2022-06-04 DIAGNOSIS — N3020 Other chronic cystitis without hematuria: Secondary | ICD-10-CM | POA: Diagnosis not present

## 2022-06-04 DIAGNOSIS — N281 Cyst of kidney, acquired: Secondary | ICD-10-CM | POA: Diagnosis not present

## 2022-06-05 DIAGNOSIS — M5451 Vertebrogenic low back pain: Secondary | ICD-10-CM | POA: Diagnosis not present

## 2022-06-07 DIAGNOSIS — M5451 Vertebrogenic low back pain: Secondary | ICD-10-CM | POA: Diagnosis not present

## 2022-06-07 DIAGNOSIS — R748 Abnormal levels of other serum enzymes: Secondary | ICD-10-CM | POA: Diagnosis not present

## 2022-06-12 DIAGNOSIS — M5451 Vertebrogenic low back pain: Secondary | ICD-10-CM | POA: Diagnosis not present

## 2022-06-14 DIAGNOSIS — M5451 Vertebrogenic low back pain: Secondary | ICD-10-CM | POA: Diagnosis not present

## 2022-06-19 DIAGNOSIS — M4326 Fusion of spine, lumbar region: Secondary | ICD-10-CM | POA: Diagnosis not present

## 2022-06-19 DIAGNOSIS — M4316 Spondylolisthesis, lumbar region: Secondary | ICD-10-CM | POA: Diagnosis not present

## 2022-06-19 DIAGNOSIS — M48062 Spinal stenosis, lumbar region with neurogenic claudication: Secondary | ICD-10-CM | POA: Diagnosis not present

## 2022-06-24 ENCOUNTER — Ambulatory Visit (INDEPENDENT_AMBULATORY_CARE_PROVIDER_SITE_OTHER): Payer: Medicare Other | Admitting: Internal Medicine

## 2022-06-24 ENCOUNTER — Encounter: Payer: Self-pay | Admitting: Internal Medicine

## 2022-06-24 VITALS — BP 140/68 | HR 66 | Ht 65.0 in | Wt 177.2 lb

## 2022-06-24 DIAGNOSIS — E785 Hyperlipidemia, unspecified: Secondary | ICD-10-CM | POA: Diagnosis not present

## 2022-06-24 DIAGNOSIS — E1165 Type 2 diabetes mellitus with hyperglycemia: Secondary | ICD-10-CM | POA: Diagnosis not present

## 2022-06-24 DIAGNOSIS — M810 Age-related osteoporosis without current pathological fracture: Secondary | ICD-10-CM | POA: Diagnosis not present

## 2022-06-24 DIAGNOSIS — E119 Type 2 diabetes mellitus without complications: Secondary | ICD-10-CM

## 2022-06-24 DIAGNOSIS — Z7984 Long term (current) use of oral hypoglycemic drugs: Secondary | ICD-10-CM

## 2022-06-24 DIAGNOSIS — K580 Irritable bowel syndrome with diarrhea: Secondary | ICD-10-CM | POA: Diagnosis not present

## 2022-06-24 LAB — POCT GLYCOSYLATED HEMOGLOBIN (HGB A1C): Hemoglobin A1C: 6.5 % — AB (ref 4.0–5.6)

## 2022-06-24 NOTE — Progress Notes (Signed)
Patient ID: Tammy Pineda, female   DOB: 13-Aug-1945, 77 y.o.   MRN: [id number]   HPI: Tammy Pineda is a 77 y.o.-year-old female, initially referred by her PCP, Dr. Zachery Dauer, returning for follow-up for DM2, dx "several years ago", non-insulin-dependent, uncontrolled, with complications (CAD, CKD stage III, NASH) and clinical osteoporosis. [name], her husband, is also my pt. Last visit 4 months ago.  Interim history: She continues to have left knee pain after her TKR and also has right knee pain and knee weaknes.  She also has back pain, for which she is getting steroid injections.  No blurry vision, nausea, chest pain.   She is seeing Dr. Ewing Schlein for IBS with diarrhea.  DM2: Reviewed HbA1c levels: Lab Results  Component Value Date   HGBA1C 6.6 (A) 02/21/2022   HGBA1C 6.3 (A) 10/22/2021   HGBA1C 6.7 (A) 06/18/2021   HGBA1C 6.5 (A) 03/15/2021   HGBA1C 6.5 (A) 11/30/2020   HGBA1C 6.1 (A) 07/27/2020   HGBA1C 6.1 (A) 04/18/2020   HGBA1C 6.7 (A) 08/11/2019   HGBA1C 6.9 (A) 05/03/2019   HGBA1C 6.4 (A) 02/08/2019   HGBA1C 5.9 10/08/2018   HGBA1C 6.2 (A) 08/04/2018  11/25/2019: HbA1c 6.5% 02/26/2018: HbA1c 6.5% 09/20/2017: HbA1c 7.3% 04/18/2017: HbA1c 7.6%  Previously on: - Metformin 1000 mg 2x a day, with meals -added 11/2017 - had significant diarrhea >> metformin ER 1000 mg 2x a day which she was tolerating well, however, she then started to have severe diarrhea >> she stopped in 09/2018. We tried Comoros in the past but this was too expensive.  Currently on: - Glipizide XL 5 mg before breakfast and 2.5 mg before a larger dinner We tried Jardiance in 03/2021 and this was too expensive.  Pt checks her sugars 2x a day (not on the CGM - not covered by her insurance anymore): - am: 81, 116-151, 245 (steroids) >> 90, 111-132, 143 - before dinner: 102-115, 200, 242 (steroid) >> n/c - bedtime: 140-160, 148-269 (steroid) >> 116, 119, 197  Previously:   Lowest sugar was 52 before  dinner (ate only oatmeal that day) ...>> 100 >> 50 >> 70; it is unclear at which level she has hypoglycemia awareness. Highest sugar was 155 >> 188 >> 200s >> 269 - steroids>> 197.  Glucometer: One Touch verio  Pt's meals are: - Breakfast: eggs, cereal + fruit, egg waffles and sugar-free syrup  - Lunch: salad, grilled chicken - Dinner: baked meat, veggies, starch  - Snacks: 1-2x - at bedtime - granola  In the past, she was exercising at [employer] and also dancing 2-3 times a week.  However, she stopped during the coronavirus pandemic.  [facility]  -+ CKD stage III, last BUN/creatinine:  Lab Results  Component Value Date   BUN 19 03/25/2022   BUN 21 03/22/2022   CREATININE 0.96 03/25/2022   CREATININE 0.95 03/22/2022  10/03/2020: 23/0.96, GR 61, Glu 181 On losartan 50.  -+ HL; last set of lipids available for review: 05/15/2022: 167/319/48/76 Lab Results  Component Value Date   CHOL 139 11/30/2020   HDL 42.70 11/30/2020   LDLCALC 70 12/22/2009   LDLDIRECT 59.0 11/30/2020   TRIG 278.0 (H) 11/30/2020   CHOLHDL 3 11/30/2020  11/25/2019: 151/268/43/65 On Crestor 5. I rec'd  fish oil 1000 mg daily after last visit >> tried >> indigestion.  - last eye exam was in 2023: + Glaucoma, no DR. Dr. Dione Booze.  She has a history of cataract surgery.  -  no numbness and tingling in her feet.  Foot exam performed 03/2021.  Pt has FH of DM in father and brother.   Osteoporosis (OP):  I reviewed pt's DXA scans: Date L1-L4 T score FN T score 33% distal Radius Ultra distal radius  06/01/2021 N/a RFN: -2.1 LFN: -2.4 -1.7 (-3.4%) -1.5  12/25/2017 N/a  RFN: -0.9 LFN: -2.1 -1.5 -1.8  FRAX: MOF 24.2%, 10-year hip fracture risk 7.2%  She has the following fractures: Left wrist - in her 77s Skull - 01/2019 - tripped in the driveway  We started Reclast - 1st dose 07/04/2021.  She tolerated it well, without jaw/hip/thigh pain.  No dizziness/vertigo/orthostasis/poor  vision.   No previous OP treatments.  No h/o vitamin D deficiency. Reviewed available vit D levels: 05/2022: Vitamin D was reportedly normal (I do not have these records) Lab Results  Component Value Date   VD25OH 26.22 (L) 06/18/2021  10/29/2017: Vitamin D 35  Pt is on: - vitamin D  - 2000 >> 5000 units daily  No weight bearing exercises. R shoulder rotator cuff.   She does not take high vitamin A doses.  Menopause was at 77 y/o - postsurgical - HRT - stopped at 77.   FH of osteoporosis: sister - hip fracture.  No h/o persistent hyper/hypocalcemia or hyperparathyroidism. No h/o kidney stones. Lab Results  Component Value Date   CALCIUM 9.1 03/25/2022   CALCIUM 8.9 03/22/2022   CALCIUM 8.7 (L) 03/21/2022   CALCIUM 9.0 03/11/2022   CALCIUM 9.6 06/18/2021   CALCIUM 8.5 (L) 01/04/2020   CALCIUM 9.6 12/22/2019   CALCIUM 9.1 05/16/2019   CALCIUM 9.8 02/08/2019   CALCIUM 9.5 02/09/2012   No h/o thyrotoxicosis. Reviewed TSH recent levels:  Lab Results  Component Value Date   TSH 2.55 10/19/2009   TSH 1.55 05/09/2006   No CKD: 06/07/2022: 25/1.01, GFR 57, glucose 140 Lab Results  Component Value Date   BUN 19 03/25/2022   CREATININE 0.96 03/25/2022   She has a history of GERD. She also has a history of HTN-on Lasix as needed, IBS with diarrhea, GERD, vaginal prolapse - h/o surgery. She also has NASH with stage 2 fibrosis- in a study.  She was previously in another study that was using a drug which helped her significantly (HbA1c dropped to 6.2%), however, the study was stopped and the drug is not available.  She sees Dr. Nickola Major for RA.  On Remicade. She had left TKR 01/03/2020 - having a lot of pain after the surgery.  She also had a DVT in the L leg - on Xarelto x 3 mo. She has repeated UTIs. She had shoulder surgery in 10/2021.  ROS: + see HPI  I reviewed pt's medications, allergies, PMH, social hx, family hx, and changes were documented in the history of present  illness. Otherwise, unchanged from my initial visit note.  Past Medical History:  Diagnosis Date   Anxiety    Arthritis    Chronic kidney disease    Coronary artery disease    nonobstructive by cardiac catheterization 2011 with a 30% LAD lesion   Depression    Diabetes (HCC)    Type 2   GERD (gastroesophageal reflux disease)    Glaucoma    Hypercholesteremia    Hypertension    NASH (nonalcoholic steatohepatitis)    NASH   Pneumonia    30 years ago   PVC (premature ventricular contraction)    Seasonal allergies    Sleep apnea    Spinal  stenosis    Past Surgical History:  Procedure Laterality Date   ANTERIOR AND POSTERIOR VAGINAL REPAIR     APPENDECTOMY  1959   BLADDER SUSPENSION     BUNIONECTOMY     CARDIAC CATHETERIZATION  2011   clean cath   CARDIOVASCULAR STRESS TEST  2022   CATARACT EXTRACTION Left 2015   CATARACT EXTRACTION W/ INTRAOCULAR LENS IMPLANT Right 2010   KNEE ARTHROSCOPY Left    NASAL RECONSTRUCTION  1976   ROBOTIC ASSISTED LAPAROSCOPIC SACROCOLPOPEXY     with mesh, urethral sling, posterior vaginal repair   SHOULDER ARTHROSCOPY Left    SHOULDER SURGERY Right 10/16/2021   repair   TOTAL ABDOMINAL HYSTERECTOMY  1976   TOTAL KNEE ARTHROPLASTY Left 01/03/2020   Procedure: TOTAL KNEE ARTHROPLASTY;  Surgeon: Ollen Gross, MD;  Location: [facility];  Service: Orthopedics;  Laterality: Left;    TRANSFORAMINAL LUMBAR INTERBODY FUSION W/ MIS 2 LEVEL Right 03/14/2022   Procedure: Minimally Invasive Surgery Decompression, Transforaminal Lumbar Interbody Fusion Lumbar three-four, Lumbar four-five;  Surgeon: Dawley, Alan Mulder, DO;  Location: [facility];  Service: Neurosurgery;  Laterality: Right;   Social History   Socioeconomic History   Marital status: Married    Spouse name: Not on file   Number of children: 1   Years of education: Not on file   Highest education level: Not on file  Occupational History   Occupation: Charity fundraiser - retired    Associate Professor: [employer]     Comment: [employer]  Tobacco Use   Smoking status: Never Smoker   Smokeless tobacco: Never Used  Substance and Sexual Activity   Alcohol use: No   Drug use: No   Current Outpatient Medications on File Prior to Visit  Medication Sig Dispense Refill   ALPRAZolam (XANAX) 0.5 MG tablet Take 0.5 tablets (0.25 mg total) by mouth at bedtime as needed for anxiety. 30 tablet 0   aspirin EC (ASPIRIN 81) 81 MG tablet Take 1 tablet (81 mg total) by mouth daily. 30 tablet 0   bisacodyl (DULCOLAX) 5 MG EC tablet Take 1 tablet (5 mg total) by mouth daily as needed for moderate constipation. 30 tablet 0   brimonidine (ALPHAGAN) 0.2 % ophthalmic solution Place 1 drop into both eyes 2 (two) times daily.     cephALEXin (KEFLEX) 500 MG capsule Take 1 capsule (500 mg total) by mouth every 12 (twelve) hours. 6 capsule 0   Cholecalciferol 125 MCG (5000 UT) TABS Take 1 tablet (5,000 Units total) by mouth daily. 30 tablet 0   Cyanocobalamin (VITAMIN B-12) 5000 MCG TBDP Take 1,000 mcg by mouth See admin instructions. Takes on Monday and Thursday 30 tablet 0   diclofenac Sodium (VOLTAREN) 1 % GEL Apply 2 g topically 4 (four) times daily. 100 g 0   dicyclomine (BENTYL) 10 MG capsule Take 1 capsule (10 mg total) by mouth 3 (three) times daily as needed for spasms. 10 capsule 0   docusate sodium (COLACE) 100 MG capsule Take 1 capsule (100 mg total) by mouth 2 (two) times daily. 10 capsule 0   escitalopram (LEXAPRO) 10 MG tablet Take 1 tablet (10 mg total) by mouth at bedtime. 30 tablet 0   fluconazole (DIFLUCAN) 150 MG tablet Take 1 tablet (150 mg total) by mouth daily. 5 tablet 0   glipiZIDE (GLUCOTROL XL) 2.5 MG 24 hr tablet TAKE 1 TABLET BY MOUTH 2x DAILY BEFORE BREAKFAST 60 tablet 0   glucose blood (ONETOUCH VERIO) test strip Use as  instructed to check blood sugar 1X daily 100 each 3   hydrochlorothiazide (HYDRODIURIL) 12.5 MG tablet Take 1 tablet (12.5 mg total) by mouth daily. 30 tablet 0   latanoprost (XALATAN)  0.005 % ophthalmic solution Place 1 drop into both eyes at bedtime.     losartan (COZAAR) 25 MG tablet Take 1 tablet (25 mg total) by mouth at bedtime. 30 tablet 0   meclizine (ANTIVERT) 12.5 MG tablet Take 1 tablet (12.5 mg total) by mouth 3 (three) times daily as needed for dizziness. 21 tablet 0   methocarbamol (ROBAXIN) 500 MG tablet Take 1 tablet (500 mg total) by mouth every 6 (six) hours as needed for muscle spasms. 60 tablet 0   omeprazole (PRILOSEC) 20 MG capsule Take 1 capsule (20 mg total) by mouth daily as needed (acid reflux/indigestion). 30 capsule 0   Oxycodone HCl 10 MG TABS Take 1 tablet (10 mg total) by mouth every 4 (four) hours as needed ((score 7 to 10)). 30 tablet 0   polyethylene glycol (MIRALAX / GLYCOLAX) 17 g packet Take 17 g by mouth daily. 14 each 0   rosuvastatin (CRESTOR) 5 MG tablet Take 1 tablet (5 mg total) by mouth at bedtime. 30 tablet 0   timolol (TIMOPTIC) 0.5 % ophthalmic solution Place 1 drop into both eyes daily.     Current Facility-Administered Medications on File Prior to Visit  Medication Dose Route Frequency Provider Last Rate Last Admin   regadenoson (LEXISCAN) injection SOLN 0.4 mg  0.4 mg Intravenous Once Jodelle Red, MD       technetium tetrofosmin (TC-MYOVIEW) injection 30.9 millicurie  30.9 millicurie Intravenous Once PRN Jodelle Red, MD       Allergies  Allergen Reactions   Metformin And Related Diarrhea   Gabapentin Palpitations   Hydrocodone Itching   Plaquenil [Hydroxychloroquine] Nausea And Vomiting   Tylenol [Acetaminophen] Other (See Comments)    History of Liver Disease   Sulfamethoxazole-Trimethoprim Rash   Tape Rash   Family History  Problem Relation Age of Onset   Heart disease Father        open heart surgery at 11   Colon cancer Father    Heart disease Mother        open heart surgery at age 31   Hypertension Mother    Heart attack Maternal Grandfather    Stroke Paternal Grandmother    Heart  block Brother    PE: BP (!) 140/68 (BP Location: Right Arm, Patient Position: Sitting, Cuff Size: Normal)   Pulse 66   Ht 5\' 5"  (1.651 m)   Wt 177 lb 3.2 oz (80.4 kg)   SpO2 97%   BMI 29.49 kg/m   Wt Readings from Last 3 Encounters:  06/24/22 177 lb 3.2 oz (80.4 kg)  03/20/22 174 lb 4.8 oz (79.1 kg)  03/14/22 176 lb (79.8 kg)   Constitutional: overweight, in NAD Eyes:  EOMI, no exophthalmos ENT: no neck masses, no cervical lymphadenopathy Cardiovascular: RRR, No MRG Respiratory: CTA B Musculoskeletal: no deformities Skin:no rashes Neurological: no tremor with outstretched hands Diabetic Foot Exam - Simple   Simple Foot Form Diabetic Foot exam was performed with the following findings: Yes 06/24/2022  2:24 PM  Visual Inspection No deformities, no ulcerations, no other skin breakdown bilaterally: Yes Sensation Testing Intact to touch and monofilament testing bilaterally: Yes Pulse Check Posterior Tibialis and Dorsalis pulse intact bilaterally: Yes Comments Hallux valgus B     ASSESSMENT: 1. DM2, non-insulin-dependent, uncontrolled, with long-term complications - CAD -  CKD stage III - NASH  2. HL  3. OP  PLAN:  1. Patient with previously uncontrolled type 2 diabetes, with improved control initially, after starting [place] but unfortunately she was not able to afford [place] or Jardiance afterwards.  She does have a history of UTIs and I am not sure if she will be able to tolerate SGLT2 inhibitors even if we are able to start these.  She is currently only on a sulfonylurea, with reasonable control.  Latest HbA1c was 6.6%, slightly higher than before.  We discussed about staying on the same dose of glipizide but increase the dose if she had steroid injections.  We tried to start the CGM but this was not covered. -At today's visit, sugars are mostly at goal.  She had 1 instance of a slightly low blood sugar at 70 after she took glipizide and did not eat breakfast and late  lunch.  I advised her that she needs to take the glipizide before a meal and to try not to forget to eat afterwards.  Otherwise, we can continue the current regimen. -I suggested to: Patient Instructions  Please continue: - Glipizide XL 5 mg daily before 2.5 mg before dinner  Please return in 4-6 months with your sugar log.   - we checked her HbA1c: 6.5% (slightly lower) - advised to check sugars at different times of the day - 1x a day, rotating check times - advised for yearly eye exams >> she is UTD - return to clinic in 4-6 months  2. HL -Reviewed latest lipid panel from 05/15/2022: 167/319/48/76-triglycerides elevated, LDL also above target -She continues on Crestor 5 mg daily without side effects.  I previously suggested fish oil 1000 mg daily and she tried this but developed indigestion and stopped  3. OP -Likely postmenopausal/age-related and she also has a family history of osteoporosis -on her bone density scores from 2023, she has a high risk of fracture based on the FRAX score. -We discussed about fall precautions and weightbearing exercises, optimizing vitamin D levels, and we also started Reclast.   -She had 1 dose of Reclast in 06/2021, tolerated well - will reorder this  -We will continue this for 3 years -Plan to check another bone density in 3 years from the previous  4.  Vitamin D insufficiency -She continues 5000 units vitamin D daily -At last visit vitamin D level was slightly low: Lab Results  Component Value Date   VD25OH 26.22 (L) 06/18/2021  -However, patient tells me that she had another HbA1c last month and this was normal  Carlus Pavlov, MD PhD [facility]

## 2022-06-24 NOTE — Patient Instructions (Addendum)
Please continue: - Glipizide XL 5 mg in am and 2.5 mg daily before dinner  Please return in 4-6 months with your sugar log.

## 2022-06-28 ENCOUNTER — Telehealth: Payer: Self-pay | Admitting: Pharmacy Technician

## 2022-06-28 NOTE — Telephone Encounter (Signed)
Auth Submission: NO AUTH NEEDED Site of care: Site of care: [facility] Payer: MEDICARE A/B Medication & CPT/J Code(s) submitted: Reclast (Zolendronic acid) [id number] Route of submission (phone, fax, portal):  Phone # Fax # Auth type: Buy/Bill Units/visits requested: 1 Reference number:  Approval from: 06/28/22 to 02/04/23

## 2022-07-02 ENCOUNTER — Encounter: Payer: Self-pay | Admitting: Internal Medicine

## 2022-07-02 DIAGNOSIS — H401222 Low-tension glaucoma, left eye, moderate stage: Secondary | ICD-10-CM | POA: Diagnosis not present

## 2022-07-04 DIAGNOSIS — M4326 Fusion of spine, lumbar region: Secondary | ICD-10-CM | POA: Diagnosis not present

## 2022-07-04 DIAGNOSIS — M5126 Other intervertebral disc displacement, lumbar region: Secondary | ICD-10-CM | POA: Diagnosis not present

## 2022-07-09 ENCOUNTER — Ambulatory Visit (INDEPENDENT_AMBULATORY_CARE_PROVIDER_SITE_OTHER): Payer: Medicare Other

## 2022-07-09 VITALS — BP 137/66 | HR 61 | Temp 97.7°F | Resp 18 | Ht 65.0 in | Wt 175.2 lb

## 2022-07-09 DIAGNOSIS — M810 Age-related osteoporosis without current pathological fracture: Secondary | ICD-10-CM

## 2022-07-09 MED ORDER — SODIUM CHLORIDE 0.9 % IV SOLN: INTRAVENOUS | Status: DC

## 2022-07-09 MED ORDER — ACETAMINOPHEN 325 MG PO TABS
650.0000 mg | ORAL_TABLET | Freq: Once | ORAL | Status: DC
  Filled 2022-07-09: qty 2

## 2022-07-09 MED ORDER — ZOLEDRONIC ACID 5 MG/100ML IV SOLN
5.0000 mg | Freq: Once | INTRAVENOUS | Status: AC
  Administered 2022-07-09: 5 mg via INTRAVENOUS
  Filled 2022-07-09: qty 100

## 2022-07-09 MED ORDER — DIPHENHYDRAMINE HCL 25 MG PO CAPS
25.0000 mg | ORAL_CAPSULE | Freq: Once | ORAL | Status: AC
  Administered 2022-07-09: 25 mg via ORAL
  Filled 2022-07-09: qty 1

## 2022-07-09 NOTE — Patient Instructions (Signed)

## 2022-07-09 NOTE — Progress Notes (Signed)
Diagnosis: Osteoporosis  Provider:  Chilton Greathouse MD  Procedure: IV Infusion  IV Type: Peripheral, IV Location: L Forearm  Reclast (Zolendronic Acid), Dose: 5 mg  Infusion Start Time: 1501  Infusion Stop Time: 1533  Post Infusion IV Care: Patient declined observation and Peripheral IV Discontinued  Discharge: Condition: Good, Destination: Home . AVS Provided  Performed by:  Adriana Mccallum, RN

## 2022-07-29 DIAGNOSIS — M48062 Spinal stenosis, lumbar region with neurogenic claudication: Secondary | ICD-10-CM | POA: Diagnosis not present

## 2022-07-29 DIAGNOSIS — M4316 Spondylolisthesis, lumbar region: Secondary | ICD-10-CM | POA: Diagnosis not present

## 2022-07-29 DIAGNOSIS — Z6827 Body mass index (BMI) 27.0-27.9, adult: Secondary | ICD-10-CM | POA: Diagnosis not present

## 2022-08-01 DIAGNOSIS — M7501 Adhesive capsulitis of right shoulder: Secondary | ICD-10-CM | POA: Diagnosis not present

## 2022-08-15 DIAGNOSIS — M48062 Spinal stenosis, lumbar region with neurogenic claudication: Secondary | ICD-10-CM | POA: Diagnosis not present

## 2022-09-11 NOTE — Progress Notes (Signed)
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  =========================================================   09/11/21-  77 yo F never smoker -followed for OSA, complicated by HBP, CAD, GERD/cough, DM2, Hyperlipidemia, Covid infection May 2022, Rheumatoid Arthritis, CPAP auto 8-15/ [facility]                   AirSense 10 AutoSet machine Download- compliance    60%, AHI 1.1/ hr Body weight today-176 lbs Covid vax-3 Phizer -----Pt f/u for OSA getting approx 8-10hrs/night of sleep. Pt is interested in getting a new machine Misses CPAP use when at [facility]. Sleeps better with CPAP.  09/12/22-   72 yo F never smoker -followed for OSA, complicated by HBP, CAD, GERD/cough, DM2, Hyperlipidemia, Covid infection May 2022, Rheumatoid Arthritis, DDD, Glaucoma,  CPAP auto 8-15/ [facility]                   AirSense 10 AutoSet machine Download- compliance   73%, AHI 1.1/ hr  Body weight today-172 lbs Download reviewed.  Compliance goals discussed.  Sleeping okay with CPAP, currently using a rental machine. Had lumbar spine surgery in February and is only beginning to move a bit better now.  ROS-see HPI Constitutional:   No-   weight loss, night sweats, fevers, chills, +fatigue, lassitude. HEENT:   No-  headaches, difficulty swallowing, tooth/dental problems, sore throat,  pain L jaw      No-  sneezing, itching, ear ache, +nasal congestion, post nasal drip,  CV:  No-   chest pain, orthopnea, PND, swelling in lower extremities, anasarca, dizziness, palpitations Resp:  shortness of breath with exertion or at rest.              productive cough,  non-productive cough,  No- coughing up of blood.              No-   change in color of mucus.  No- wheezing.   Skin: No-   rash or lesions. GI:  No-   heartburn, +indigestion, abdominal pain, nausea, vomiting, GU:  MS:  No-   joint pain or  swelling. Neuro-     nothing unusual Psych:  No- change in mood or affect. No depression or anxiety.  No memory loss.  OBJ- Physical Exam General- Alert, Oriented, Affect-appropriate, Distress- none acute. Medium build, looks well Skin- rash-none, lesions- none, excoriation- none Lymphadenopathy- none Head- atraumatic            Eyes- Gross vision intact, PERRLA, conjunctivae and secretions clear            Ears- Hearing, canals-normal.             Nose- Clear, + mild external deviation, +more narrow on the right,  No-mucus, polyps,  erosion, perforation             Throat- Mallampati II , mucosa clear , drainage- none, tonsils- atrophic. Teeth and                                 oropharynx seem ok. Neck- flexible , trachea midline, no stridor , thyroid nl, carotid no bruit Chest - symmetrical excursion , unlabored           Heart/CV- RRR , no murmur , no gallop  , no rub, nl s1 s2                           -  JVD- none , edema- none, stasis changes- none, varices- none           Lung- clear, unlabored, wheeze- none, cough-none , dullness-none, rub- none           Chest wall-  Abd-  Br/ Gen/ Rectal- Not done, not indicated Extrem- cyanosis- none, clubbing, none, atrophy- none, strength- nl Neuro- grossly intact to observation

## 2022-09-12 ENCOUNTER — Ambulatory Visit (INDEPENDENT_AMBULATORY_CARE_PROVIDER_SITE_OTHER): Payer: Medicare Other | Admitting: Internal Medicine

## 2022-09-12 ENCOUNTER — Encounter: Payer: Self-pay | Admitting: Internal Medicine

## 2022-09-12 VITALS — BP 120/60 | HR 79 | Ht 66.0 in | Wt 172.6 lb

## 2022-09-12 DIAGNOSIS — G4733 Obstructive sleep apnea (adult) (pediatric): Secondary | ICD-10-CM

## 2022-09-12 DIAGNOSIS — M5416 Radiculopathy, lumbar region: Secondary | ICD-10-CM | POA: Diagnosis not present

## 2022-09-12 NOTE — Patient Instructions (Signed)
Order- DME [facility]- please replace old/ malfunctioning CPAP machine Auto 8-15, mask of choice, humidifier, supplies AirView/ card  Please call if we can help

## 2022-09-18 DIAGNOSIS — Z6828 Body mass index (BMI) 28.0-28.9, adult: Secondary | ICD-10-CM | POA: Diagnosis not present

## 2022-09-18 DIAGNOSIS — M48062 Spinal stenosis, lumbar region with neurogenic claudication: Secondary | ICD-10-CM | POA: Diagnosis not present

## 2022-09-25 ENCOUNTER — Encounter: Payer: Self-pay | Admitting: Internal Medicine

## 2022-09-25 NOTE — Assessment & Plan Note (Signed)
Benefits from CPAP and sleeps better with it. Plan-we are asking DME to go ahead with replacement of old machine, continuing auto 8-15

## 2022-09-25 NOTE — Assessment & Plan Note (Signed)
Sleep position and comfort were impacted by necessary spine surgery but she is beginning to do better.

## 2022-10-01 ENCOUNTER — Encounter: Payer: Self-pay | Admitting: Internal Medicine

## 2022-10-09 ENCOUNTER — Other Ambulatory Visit: Payer: Self-pay | Admitting: Family Medicine

## 2022-10-09 DIAGNOSIS — Z1231 Encounter for screening mammogram for malignant neoplasm of breast: Secondary | ICD-10-CM

## 2022-10-29 ENCOUNTER — Ambulatory Visit
Admission: RE | Admit: 2022-10-29 | Discharge: 2022-10-29 | Disposition: A | Discharge status: Home / Self Care | Payer: Medicare Other | Source: Ambulatory Visit | Attending: Family Medicine | Admitting: Family Medicine

## 2022-10-29 DIAGNOSIS — L820 Inflamed seborrheic keratosis: Secondary | ICD-10-CM | POA: Diagnosis not present

## 2022-10-29 DIAGNOSIS — Z85828 Personal history of other malignant neoplasm of skin: Secondary | ICD-10-CM | POA: Diagnosis not present

## 2022-10-29 DIAGNOSIS — Z1231 Encounter for screening mammogram for malignant neoplasm of breast: Secondary | ICD-10-CM | POA: Diagnosis not present

## 2022-10-29 DIAGNOSIS — L821 Other seborrheic keratosis: Secondary | ICD-10-CM | POA: Diagnosis not present

## 2022-10-29 DIAGNOSIS — L570 Actinic keratosis: Secondary | ICD-10-CM | POA: Diagnosis not present

## 2022-11-18 DIAGNOSIS — M48062 Spinal stenosis, lumbar region with neurogenic claudication: Secondary | ICD-10-CM | POA: Diagnosis not present

## 2022-11-27 DIAGNOSIS — Z860100 Personal history of colon polyps, unspecified: Secondary | ICD-10-CM | POA: Diagnosis not present

## 2022-11-27 DIAGNOSIS — Z800 Family history of malignant neoplasm of digestive organs: Secondary | ICD-10-CM | POA: Diagnosis not present

## 2022-11-27 DIAGNOSIS — K219 Gastro-esophageal reflux disease without esophagitis: Secondary | ICD-10-CM | POA: Diagnosis not present

## 2022-11-27 DIAGNOSIS — K580 Irritable bowel syndrome with diarrhea: Secondary | ICD-10-CM | POA: Diagnosis not present

## 2022-11-27 DIAGNOSIS — K760 Fatty (change of) liver, not elsewhere classified: Secondary | ICD-10-CM | POA: Diagnosis not present

## 2022-12-17 DIAGNOSIS — Z0000 Encounter for general adult medical examination without abnormal findings: Secondary | ICD-10-CM | POA: Diagnosis not present

## 2022-12-17 DIAGNOSIS — E7800 Pure hypercholesterolemia, unspecified: Secondary | ICD-10-CM | POA: Diagnosis not present

## 2022-12-17 DIAGNOSIS — Z23 Encounter for immunization: Secondary | ICD-10-CM | POA: Diagnosis not present

## 2022-12-17 DIAGNOSIS — E538 Deficiency of other specified B group vitamins: Secondary | ICD-10-CM | POA: Diagnosis not present

## 2022-12-17 DIAGNOSIS — Z1211 Encounter for screening for malignant neoplasm of colon: Secondary | ICD-10-CM | POA: Diagnosis not present

## 2022-12-17 DIAGNOSIS — R748 Abnormal levels of other serum enzymes: Secondary | ICD-10-CM | POA: Diagnosis not present

## 2022-12-17 DIAGNOSIS — E559 Vitamin D deficiency, unspecified: Secondary | ICD-10-CM | POA: Diagnosis not present

## 2022-12-26 ENCOUNTER — Ambulatory Visit (INDEPENDENT_AMBULATORY_CARE_PROVIDER_SITE_OTHER): Payer: Medicare Other | Admitting: Internal Medicine

## 2022-12-26 ENCOUNTER — Encounter: Payer: Self-pay | Admitting: Internal Medicine

## 2022-12-26 VITALS — BP 138/72 | HR 68 | Ht 66.0 in | Wt 172.0 lb

## 2022-12-26 DIAGNOSIS — Z7984 Long term (current) use of oral hypoglycemic drugs: Secondary | ICD-10-CM | POA: Diagnosis not present

## 2022-12-26 DIAGNOSIS — M810 Age-related osteoporosis without current pathological fracture: Secondary | ICD-10-CM

## 2022-12-26 DIAGNOSIS — E1165 Type 2 diabetes mellitus with hyperglycemia: Secondary | ICD-10-CM

## 2022-12-26 DIAGNOSIS — E559 Vitamin D deficiency, unspecified: Secondary | ICD-10-CM

## 2022-12-26 DIAGNOSIS — E785 Hyperlipidemia, unspecified: Secondary | ICD-10-CM

## 2022-12-26 LAB — POCT GLYCOSYLATED HEMOGLOBIN (HGB A1C): Hemoglobin A1C: 6.8 % — AB (ref 4.0–5.6)

## 2022-12-26 LAB — MICROALBUMIN / CREATININE URINE RATIO
Creatinine,U: 52.3 mg/dL
Microalb Creat Ratio: 1.3 mg/g (ref 0.0–30.0)
Microalb, Ur: <0.7 mg/dL (ref 0.0–1.9)

## 2022-12-26 NOTE — Addendum Note (Signed)
Addended by: Carlus Pavlov on: 12/26/2022 12:12 PM   Modules accepted: Orders

## 2022-12-26 NOTE — Progress Notes (Signed)
Patient ID: Tammy Pineda, female   DOB: 1945/12/18, 77 y.o.   MRN: [id number]   HPI: Tammy Pineda is a 77 y.o.-year-old female, initially referred by her PCP, Dr. Zachery Dauer, returning for follow-up for DM2, dx "several years ago", non-insulin-dependent, uncontrolled, with complications (CAD, CKD stage III, NASH) and clinical osteoporosis. Tammy Pineda, her husband, is also my pt. Last visit 6 months ago.  Interim history: She continues to have left knee pain after her TKR and also has right knee pain and weakness.  She also has back pain, for which she is getting steroid injections - last was 6 weeks ago.  This did not work. No blurry vision, nausea, chest pain.   She is seeing Dr. Ewing Schlein for IBS with diarrhea.  DM2: Reviewed HbA1c levels: Lab Results  Component Value Date   HGBA1C 6.5 (A) 06/24/2022   HGBA1C 6.6 (A) 02/21/2022   HGBA1C 6.3 (A) 10/22/2021   HGBA1C 6.7 (A) 06/18/2021   HGBA1C 6.5 (A) 03/15/2021   HGBA1C 6.5 (A) 11/30/2020   HGBA1C 6.1 (A) 07/27/2020   HGBA1C 6.1 (A) 04/18/2020   HGBA1C 6.7 (A) 08/11/2019   HGBA1C 6.9 (A) 05/03/2019   HGBA1C 6.4 (A) 02/08/2019   HGBA1C 5.9 10/08/2018   HGBA1C 6.2 (A) 08/04/2018  11/25/2019: HbA1c 6.5% 02/26/2018: HbA1c 6.5% 09/20/2017: HbA1c 7.3% 04/18/2017: HbA1c 7.6%  Previously on: - Metformin 1000 mg 2x a day, with meals -added 11/2017 - had significant diarrhea >> metformin ER 1000 mg 2x a day which she was tolerating well, however, she then started to have severe diarrhea >> she stopped in 09/2018. We tried [place] in the past but this was too expensive.  Currently on: - Glipizide XL 5 mg before breakfast and 2.5 mg before a larger dinner >> actually taking 2.5 mg 2x a day after meals! We tried Jardiance in 03/2021 and this was too expensive.  Pt checks her sugars 2x a day (not on the CGM - not covered by her insurance anymore): - am: 81, 116-151, 245 (steroids) >> 90, 111-132, 143 >> 127-150s - before dinner: 102-115, 200,  242 (steroid) >> n/c - bedtime: 140-160, 148-269 (steroid) >> 116, 119, 197 >> 180s  Previously:   Lowest sugar was 50 >> 70; it is unclear at which level she has hypoglycemia awareness. Highest sugar was 269 - steroids>> 197.  Glucometer: One Touch verio  Pt's meals are: - Breakfast: eggs, cereal + fruit, egg waffles and sugar-free syrup  - Lunch: salad, grilled chicken - Dinner: baked meat, veggies, starch  - Snacks: 1-2x - at bedtime - granola  In the past, she was exercising at [employer] and also dancing 2-3 times a week.  However, she stopped during the coronavirus pandemic.  [facility]  -+ CKD stage III, last BUN/creatinine:  12/17/2022: 21/0.9, GFR 65, Glu 89 Lab Results  Component Value Date   BUN 19 03/25/2022   BUN 21 03/22/2022   CREATININE 0.96 03/25/2022   CREATININE 0.95 03/22/2022  10/03/2020: 23/0.96, GR 61, Glu 181 Lab Results  Component Value Date   MICRALBCREAT 1.1 02/08/2019  On losartan 50.  -+ HL; last set of lipids available for review: 12/20/2022: 157/186/50/76 05/15/2022: 167/319/48/76 Lab Results  Component Value Date   CHOL 139 11/30/2020   HDL 42.70 11/30/2020   LDLCALC 70 12/22/2009   LDLDIRECT 59.0 11/30/2020   TRIG 278.0 (H) 11/30/2020   CHOLHDL 3 11/30/2020  11/25/2019: 151/268/43/65 On Crestor 5. I rec'd  fish oil 1000  mg daily after last visit >> tried >> indigestion.  - last eye exam was in 2024: + Glaucoma, no DR. Dr. Dione Booze.  She has a history of cataract surgery.  - no numbness and tingling in her feet.  Foot exam performed 06/24/2022.  Pt has FH of DM in father and brother.   Osteoporosis (OP):  I reviewed pt's DXA scans: Date L1-L4 T score FN T score 33% distal Radius Ultra distal radius  06/01/2021 N/a RFN: -2.1 LFN: -2.4 -1.7 (-3.4%) -1.5  12/25/2017 N/a  RFN: -0.9 LFN: -2.1 -1.5 -1.8  FRAX: MOF 24.2%, 10-year hip fracture risk 7.2%  She has the following fractures: Left wrist - in her  79s Skull - 01/2019 - tripped in the driveway  We started Reclast - 1st dose 07/04/2021.  She tolerated it well, without jaw/hip/thigh pain.  No dizziness/vertigo/orthostasis/poor vision.   No previous OP treatments.  No h/o vitamin D deficiency. Reviewed available vit D levels: 12/17/2022: vitamin D 28.7 05/2022: Vitamin D was reportedly normal (I do not have these records) Lab Results  Component Value Date   VD25OH 26.22 (L) 06/18/2021  10/29/2017: Vitamin D 35  Pt is on: - vitamin D  - 2000 >> 5000 units daily>> then 2000 units -just  increased to 4000 units daily.  No weight bearing exercises. R shoulder rotator cuff.   She does not take high vitamin A doses.  Menopause was at 77 y/o - postsurgical - HRT - stopped at 43.   FH of osteoporosis: sister - hip fracture.  No h/o persistent hyper/hypocalcemia or hyperparathyroidism. No h/o kidney stones. Lab Results  Component Value Date   CALCIUM 9.1 03/25/2022   CALCIUM 8.9 03/22/2022   CALCIUM 8.7 (L) 03/21/2022   CALCIUM 9.0 03/11/2022   CALCIUM 9.6 06/18/2021   CALCIUM 8.5 (L) 01/04/2020   CALCIUM 9.6 12/22/2019   CALCIUM 9.1 05/16/2019   CALCIUM 9.8 02/08/2019   CALCIUM 9.5 02/09/2012   No h/o thyrotoxicosis. Reviewed TSH recent levels:  Lab Results  Component Value Date   TSH 2.55 10/19/2009   TSH 1.55 05/09/2006   She has a history of GERD. She also has a history of HTN-on Lasix as needed, IBS with diarrhea, GERD, vaginal prolapse - h/o surgery. She also has NASH with stage 2 fibrosis- in a study.  She was previously in another study that was using a drug which helped her significantly (HbA1c dropped to 6.2%), however, the study was stopped and the drug is not available.  She sees Dr. Nickola Major for RA.  On Remicade. She had left TKR 01/03/2020 - having a lot of pain after the surgery.  She also had a DVT in the L leg - on Xarelto x 3 mo. She has repeated UTIs. She had shoulder surgery in 10/2021.  ROS: + see  HPI  I reviewed pt's medications, allergies, PMH, social hx, family hx, and changes were documented in the history of present illness. Otherwise, unchanged from my initial visit note.  Past Medical History:  Diagnosis Date   Anxiety    Arthritis    Chronic kidney disease    Coronary artery disease    nonobstructive by cardiac catheterization 2011 with a 30% LAD lesion   Depression    Diabetes (HCC)    Type 2   GERD (gastroesophageal reflux disease)    Glaucoma    Hypercholesteremia    Hypertension    NASH (nonalcoholic steatohepatitis)    NASH   Pneumonia    30 years  ago   PVC (premature ventricular contraction)    Seasonal allergies    Sleep apnea    Spinal stenosis    Past Surgical History:  Procedure Laterality Date   ANTERIOR AND POSTERIOR VAGINAL REPAIR     APPENDECTOMY  1959   BLADDER SUSPENSION     BUNIONECTOMY     CARDIAC CATHETERIZATION  2011   clean cath   CARDIOVASCULAR STRESS TEST  2022   CATARACT EXTRACTION Left 2015   CATARACT EXTRACTION W/ INTRAOCULAR LENS IMPLANT Right 2010   KNEE ARTHROSCOPY Left    NASAL RECONSTRUCTION  1976   ROBOTIC ASSISTED LAPAROSCOPIC SACROCOLPOPEXY     with mesh, urethral sling, posterior vaginal repair   SHOULDER ARTHROSCOPY Left    SHOULDER SURGERY Right 10/16/2021   repair   TOTAL ABDOMINAL HYSTERECTOMY  1976   TOTAL KNEE ARTHROPLASTY Left 01/03/2020   Procedure: TOTAL KNEE ARTHROPLASTY;  Surgeon: Ollen Gross, MD;  Location: [facility];  Service: Orthopedics;  Laterality: Left;    TRANSFORAMINAL LUMBAR INTERBODY FUSION W/ MIS 2 LEVEL Right 03/14/2022   Procedure: Minimally Invasive Surgery Decompression, Transforaminal Lumbar Interbody Fusion Lumbar three-four, Lumbar four-five;  Surgeon: Dawley, Alan Mulder, DO;  Location: [facility];  Service: Neurosurgery;  Laterality: Right;   Social History   Socioeconomic History   Marital status: Married    Spouse name: Not on file   Number of children: 1   Years of education: Not on  file   Highest education level: Not on file  Occupational History   Occupation: Charity fundraiser - retired    Associate Professor: [employer]     Comment: [employer]  Tobacco Use   Smoking status: Never Smoker   Smokeless tobacco: Never Used  Substance and Sexual Activity   Alcohol use: No   Drug use: No   Current Outpatient Medications on File Prior to Visit  Medication Sig Dispense Refill   ALPRAZolam (XANAX) 0.5 MG tablet Take 0.5 tablets (0.25 mg total) by mouth at bedtime as needed for anxiety. 30 tablet 0   aspirin EC (ASPIRIN 81) 81 MG tablet Take 1 tablet (81 mg total) by mouth daily. 30 tablet 0   bisacodyl (DULCOLAX) 5 MG EC tablet Take 1 tablet (5 mg total) by mouth daily as needed for moderate constipation. 30 tablet 0   brimonidine (ALPHAGAN) 0.2 % ophthalmic solution Place 1 drop into both eyes 2 (two) times daily.     Cholecalciferol 125 MCG (5000 UT) TABS Take 1 tablet (5,000 Units total) by mouth daily. 30 tablet 0   Cyanocobalamin (VITAMIN B-12) 5000 MCG TBDP Take 1,000 mcg by mouth See admin instructions. Takes on Monday and Thursday 30 tablet 0   diclofenac Sodium (VOLTAREN) 1 % GEL Apply 2 g topically 4 (four) times daily. 100 g 0   dicyclomine (BENTYL) 10 MG capsule Take 1 capsule (10 mg total) by mouth 3 (three) times daily as needed for spasms. 10 capsule 0   escitalopram (LEXAPRO) 10 MG tablet Take 1 tablet (10 mg total) by mouth at bedtime. 30 tablet 0   glipiZIDE (GLUCOTROL XL) 2.5 MG 24 hr tablet TAKE 1 TABLET BY MOUTH 2x DAILY BEFORE BREAKFAST 60 tablet 0   glucose blood (ONETOUCH VERIO) test strip Use as instructed to check blood sugar 1X daily 100 each 3   hydrochlorothiazide (HYDRODIURIL) 12.5 MG tablet Take 1 tablet (12.5 mg total) by mouth daily. 30 tablet 0   latanoprost (XALATAN) 0.005 % ophthalmic solution Place 1 drop into both eyes  at bedtime.     losartan (COZAAR) 25 MG tablet Take 1 tablet (25 mg total) by mouth at bedtime. 30 tablet 0   meclizine (ANTIVERT) 12.5 MG  tablet Take 1 tablet (12.5 mg total) by mouth 3 (three) times daily as needed for dizziness. 21 tablet 0   methocarbamol (ROBAXIN) 500 MG tablet Take 1 tablet (500 mg total) by mouth every 6 (six) hours as needed for muscle spasms. 60 tablet 0   omeprazole (PRILOSEC) 20 MG capsule Take 1 capsule (20 mg total) by mouth daily as needed (acid reflux/indigestion). 30 capsule 0   Oxycodone HCl 10 MG TABS Take 1 tablet (10 mg total) by mouth every 4 (four) hours as needed ((score 7 to 10)). 30 tablet 0   polyethylene glycol (MIRALAX / GLYCOLAX) 17 g packet Take 17 g by mouth daily. 14 each 0   rosuvastatin (CRESTOR) 5 MG tablet Take 1 tablet (5 mg total) by mouth at bedtime. 30 tablet 0   timolol (TIMOPTIC) 0.5 % ophthalmic solution Place 1 drop into both eyes daily.     Current Facility-Administered Medications on File Prior to Visit  Medication Dose Route Frequency Provider Last Rate Last Admin   regadenoson (LEXISCAN) injection SOLN 0.4 mg  0.4 mg Intravenous Once Jodelle Red, MD       technetium tetrofosmin (TC-MYOVIEW) injection 30.9 millicurie  30.9 millicurie Intravenous Once PRN Jodelle Red, MD       Allergies  Allergen Reactions   Metformin And Related Diarrhea   Gabapentin Palpitations   Hydrocodone Itching   Plaquenil [Hydroxychloroquine] Nausea And Vomiting   Tylenol [Acetaminophen] Other (See Comments)    History of Liver Disease   Sulfamethoxazole-Trimethoprim Rash   Tape Rash   Family History  Problem Relation Age of Onset   Heart disease Father        open heart surgery at 32   Colon cancer Father    Heart disease Mother        open heart surgery at age 30   Hypertension Mother    Heart attack Maternal Grandfather    Stroke Paternal Grandmother    Heart block Brother    PE: There were no vitals taken for this visit.  Wt Readings from Last 3 Encounters:  09/12/22 172 lb 9.6 oz (78.3 kg)  07/09/22 175 lb 3.2 oz (79.5 kg)  06/24/22 177 lb 3.2 oz  (80.4 kg)   Constitutional: overweight, in NAD Eyes:  EOMI, no exophthalmos ENT: no neck masses, no cervical lymphadenopathy Cardiovascular: RRR, No MRG Respiratory: CTA B Musculoskeletal: no deformities Skin:no rashes Neurological: no tremor with outstretched hands  ASSESSMENT: 1. DM2, non-insulin-dependent, uncontrolled, with long-term complications - CAD - CKD stage III - NASH  2. HL  3. OP  4.  Vitamin D deficiency  PLAN:  1. Patient with previously uncontrolled type 2 diabetes, with improved control initially after starting [place] but unfortunately we could not continue [place] or switch to [place] afterwards due to price.  She does have a history of UTIs and I am not sure if she would be able to tolerate these even if we are able to start.  She is currently only on a sulfonylurea with reasonable control.  At last visit, HbA1c was lower, at 6.5%.  Sugars are mostly at goal with the exception of a mild low blood sugar of 70 after she took glipizide and did not eat breakfast and a late lunch.  We discussed about not skipping meals while on this medication.  We did not change her regimen at that time. -At today's visit, sugars are higher due to eating many sweets but also reducing the dose of glipizide in the morning since last visit (inadvertently), and taking the glipizide after meals.  We discussed about the importance of moving the glipizide approximately 20 to 30 minutes before meals and I advised her that if the sugars do not improve after this measure, to also increase the morning glipizide to 5 mg daily.  Ultimately, she will also need to reduce her intake of sweets. -I suggested to: Patient Instructions  Please take: - Glipizide XL 2.5 mg 2x daily before meals.  If sugars stay high after this, increase the am dose to 5 mg in am.  Please return in 4 months with your sugar log.   - we checked her HbA1c: 6.8% (higher) - advised to check sugars at different times of the  day - 1x a day, rotating check times - advised for yearly eye exams >> she is UTD - will check an ACR today - return to clinic in 4 months  2. HL -Reviewed latest lipid panel 12/20/2022: 157/186/50/76-triglycerides elevated but improved, LDL above target -She continues Crestor 5 mg daily without side effects.  We tried fish oil 1000 mg daily but developed indigestion and stopped  3. OP -Likely postmenopausal/age-related and she also has a family history of osteoporosis -on her bone density scores from 2023, she has a high risk of fracture based on the FRAX score. -Discussed about fall precautions, weightbearing exercises, optimizing vitamin D levels and also started Reclast -She had 1 dose of Reclast in 06/2021, tolerated well -Will continue this for 3 years -Plan to check another bone density in 3 years from the previous  4.  Vitamin D insufficiency -She was on 5000 units vitamin D daily, but at today's visit tells me that she was on 2000 units daily (she believes).  I will Vitamin D level was slightly low at last check earlier this month (see HPI). PCP advised her at that time to double up on the dose of her vitamin D to 4000 units daily, which she started approximately 4 days ago -She has an follow-up with PCP coming up for this  Carlus Pavlov, MD PhD [facility] Endocrinology

## 2022-12-26 NOTE — Patient Instructions (Addendum)
Please take: - Glipizide XL 2.5 mg 2x daily before meals.  If sugars stay high after this, increase the am dose to 5 mg in am.  Please return in 4 months with your sugar log.

## 2022-12-31 DIAGNOSIS — Z961 Presence of intraocular lens: Secondary | ICD-10-CM | POA: Diagnosis not present

## 2022-12-31 DIAGNOSIS — E119 Type 2 diabetes mellitus without complications: Secondary | ICD-10-CM | POA: Diagnosis not present

## 2022-12-31 DIAGNOSIS — H401222 Low-tension glaucoma, left eye, moderate stage: Secondary | ICD-10-CM | POA: Diagnosis not present

## 2023-01-01 DIAGNOSIS — M48062 Spinal stenosis, lumbar region with neurogenic claudication: Secondary | ICD-10-CM | POA: Diagnosis not present

## 2023-01-01 DIAGNOSIS — Z6828 Body mass index (BMI) 28.0-28.9, adult: Secondary | ICD-10-CM | POA: Diagnosis not present

## 2023-01-01 DIAGNOSIS — M5416 Radiculopathy, lumbar region: Secondary | ICD-10-CM | POA: Diagnosis not present

## 2023-02-13 ENCOUNTER — Telehealth: Payer: Self-pay

## 2023-02-13 MED ORDER — FREESTYLE LIBRE 2 READER DEVI: 1.0000 | Freq: Every day | 0 refills | Status: AC

## 2023-02-13 MED ORDER — FREESTYLE LIBRE 2 SENSOR MISC: 1.0000 | 3 refills | Status: DC

## 2023-02-13 NOTE — Telephone Encounter (Signed)
 Requested Prescriptions   Signed Prescriptions Disp Refills   Continuous Glucose Sensor (FREESTYLE LIBRE 2 SENSOR) MISC 6 each 3    Sig: 1 each by Does not apply route every 14 (fourteen) days. Use GoodRx    Authorizing Provider: TRIXIE FILE    Ordering User: Malikai Gut S   Continuous Glucose Receiver (FREESTYLE LIBRE 2 READER) DEVI 1 each 0    Sig: 1 each by Does not apply route daily. Use GoodRx    Authorizing Provider: TRIXIE FILE    Ordering User: CLEOTILDE REMAK S   Patient called stating she is willing to pay OOP for the [facility] 2 sensor.

## 2023-02-17 ENCOUNTER — Other Ambulatory Visit: Payer: Self-pay | Admitting: Surgery

## 2023-02-17 DIAGNOSIS — M79605 Pain in left leg: Secondary | ICD-10-CM

## 2023-02-17 DIAGNOSIS — M4726 Other spondylosis with radiculopathy, lumbar region: Secondary | ICD-10-CM | POA: Diagnosis not present

## 2023-02-17 DIAGNOSIS — Z6828 Body mass index (BMI) 28.0-28.9, adult: Secondary | ICD-10-CM | POA: Diagnosis not present

## 2023-02-18 ENCOUNTER — Ambulatory Visit
Admission: RE | Admit: 2023-02-18 | Discharge: 2023-02-18 | Disposition: A | Discharge status: Home / Self Care | Payer: Medicare Other | Source: Ambulatory Visit | Attending: Surgery | Admitting: Surgery

## 2023-02-18 DIAGNOSIS — M79652 Pain in left thigh: Secondary | ICD-10-CM | POA: Diagnosis not present

## 2023-02-18 DIAGNOSIS — M79605 Pain in left leg: Secondary | ICD-10-CM

## 2023-03-03 DIAGNOSIS — M47816 Spondylosis without myelopathy or radiculopathy, lumbar region: Secondary | ICD-10-CM | POA: Diagnosis not present

## 2023-03-05 DIAGNOSIS — M48061 Spinal stenosis, lumbar region without neurogenic claudication: Secondary | ICD-10-CM | POA: Diagnosis not present

## 2023-03-05 DIAGNOSIS — K760 Fatty (change of) liver, not elsewhere classified: Secondary | ICD-10-CM | POA: Diagnosis not present

## 2023-03-05 DIAGNOSIS — Z79899 Other long term (current) drug therapy: Secondary | ICD-10-CM | POA: Diagnosis not present

## 2023-03-05 DIAGNOSIS — Z6829 Body mass index (BMI) 29.0-29.9, adult: Secondary | ICD-10-CM | POA: Diagnosis not present

## 2023-03-05 DIAGNOSIS — M0609 Rheumatoid arthritis without rheumatoid factor, multiple sites: Secondary | ICD-10-CM | POA: Diagnosis not present

## 2023-03-05 DIAGNOSIS — E119 Type 2 diabetes mellitus without complications: Secondary | ICD-10-CM | POA: Diagnosis not present

## 2023-03-05 DIAGNOSIS — M1991 Primary osteoarthritis, unspecified site: Secondary | ICD-10-CM | POA: Diagnosis not present

## 2023-03-05 DIAGNOSIS — R5383 Other fatigue: Secondary | ICD-10-CM | POA: Diagnosis not present

## 2023-03-05 DIAGNOSIS — E663 Overweight: Secondary | ICD-10-CM | POA: Diagnosis not present

## 2023-03-10 ENCOUNTER — Telehealth: Payer: Self-pay

## 2023-03-10 MED ORDER — GLIPIZIDE ER 2.5 MG PO TB24: ORAL_TABLET | ORAL | 1 refills | Status: DC

## 2023-03-11 NOTE — Telephone Encounter (Signed)
 Requested Prescriptions   Signed Prescriptions Disp Refills   glipiZIDE  (GLUCOTROL  XL) 2.5 MG 24 hr tablet 270 tablet 1    Sig: Take 2 tablets in the AM and 1 tablet in the evening.    Authorizing Provider: TRIXIE FILE    Ordering User: CLEOTILDE ROLIN RAMAN

## 2023-03-13 ENCOUNTER — Ambulatory Visit: Payer: Medicare Other | Admitting: Internal Medicine

## 2023-03-13 ENCOUNTER — Encounter: Payer: Self-pay | Admitting: Internal Medicine

## 2023-03-13 VITALS — BP 136/80 | HR 70 | Ht 66.0 in | Wt 182.8 lb

## 2023-03-13 DIAGNOSIS — M810 Age-related osteoporosis without current pathological fracture: Secondary | ICD-10-CM | POA: Diagnosis not present

## 2023-03-13 DIAGNOSIS — E785 Hyperlipidemia, unspecified: Secondary | ICD-10-CM

## 2023-03-13 DIAGNOSIS — Z7984 Long term (current) use of oral hypoglycemic drugs: Secondary | ICD-10-CM | POA: Diagnosis not present

## 2023-03-13 DIAGNOSIS — E1165 Type 2 diabetes mellitus with hyperglycemia: Secondary | ICD-10-CM

## 2023-03-13 NOTE — Patient Instructions (Addendum)
 Please reduce: - Glipizide  XL 2.5 mg 2x daily before b'fast and before dinner  Please return in 4 months.

## 2023-03-13 NOTE — Progress Notes (Signed)
 Patient ID: Tammy Pineda, female   DOB: 12/18/1945, 78 y.o.   MRN: [id number]   HPI: Tammy Pineda is a 78 y.o.-year-old female, initially referred by her PCP, Dr. Gwenn, returning for follow-up for DM2, dx ~2019, non-insulin -dependent, uncontrolled, with complications (CAD, CKD stage III, NASH) and clinical osteoporosis. Deward Fife, her husband, is also my pt. Last visit 2 months ago.  Interim history: She continues to have left knee pain after her TKR and also has right knee pain and weakness.  She also has back pain, for which she was getting steroid injections, but stopped as these did not work.  Now getting lidocaine  injections under fluoroscopy.   She was recently on a Prednisone taper (1 mo ago >> sugars 396).  No blurry vision, nausea, chest pain.   She is seeing GI (Dr. Rosalie) for IBS with diarrhea.  DM2: Reviewed HbA1c levels: 02/27/2023: HbA1c 7.3% reportedly Lab Results  Component Value Date   HGBA1C 6.8 (A) 12/26/2022   HGBA1C 6.5 (A) 06/24/2022   HGBA1C 6.6 (A) 02/21/2022   HGBA1C 6.3 (A) 10/22/2021   HGBA1C 6.7 (A) 06/18/2021   HGBA1C 6.5 (A) 03/15/2021   HGBA1C 6.5 (A) 11/30/2020   HGBA1C 6.1 (A) 07/27/2020   HGBA1C 6.1 (A) 04/18/2020   HGBA1C 6.7 (A) 08/11/2019   HGBA1C 6.9 (A) 05/03/2019   HGBA1C 6.4 (A) 02/08/2019   HGBA1C 5.9 10/08/2018   HGBA1C 6.2 (A) 08/04/2018  11/25/2019: HbA1c 6.5% 02/26/2018: HbA1c 6.5% 09/20/2017: HbA1c 7.3% 04/18/2017: HbA1c 7.6%  Previously on: - Metformin  1000 mg 2x a day, with meals -added 11/2017 - had significant diarrhea >> metformin  ER 1000 mg 2x a day which she was tolerating well, however, she then started to have severe diarrhea >> she stopped in 09/2018. We tried Farxiga  in the past but this was too expensive.  Currently on: - Glipizide  XL 5 mg before breakfast and 2.5 mg before a larger dinner >> actually taking 2.5 mg 2x a day after meals! >>  Moved before meals but takes it 3 times a day We tried Jardiance  in  03/2021 and this was too expensive.  Pt checks her sugars > 4x a day:  Prev.: - am: 81, 116-151, 245 (steroids) >> 90, 111-132, 143 >> 127-150s - before dinner: 102-115, 200, 242 (steroid) >> n/c - bedtime: 140-160, 148-269 (steroid) >> 116, 119, 197 >> 180s  Previously:   Lowest sugar was 50 >> 70 >> 59; it is unclear at which level she has hypoglycemia awareness. Highest sugar was 269 - steroids>> 197 >> 396 (steroids).  Glucometer: One Touch verio  Pt's meals are: - Breakfast: eggs, cereal + fruit, egg waffles and sugar-free syrup  - Lunch: salad, grilled chicken - Dinner: baked meat, veggies, starch  - Snacks: 1-2x - at bedtime - granola  In the past, she was exercising at [employer] and also dancing 2-3 times a week.  However, she stopped during the coronavirus pandemic.  [facility]  -+ CKD stage III, last BUN/creatinine:  12/17/2022: 21/0.9, GFR 65, Glu 89 Lab Results  Component Value Date   BUN 19 03/25/2022   BUN 21 03/22/2022   CREATININE 0.96 03/25/2022   CREATININE 0.95 03/22/2022  10/03/2020: 23/0.96, GR 61, Glu 181 Lab Results  Component Value Date   MICRALBCREAT 1.3 12/26/2022   MICRALBCREAT 1.1 02/08/2019  On losartan  50.  -+ HL; last set of lipids available for review: 12/20/2022: 157/186/50/76 05/15/2022: 167/319/48/76 Lab Results  Component Value Date  CHOL 139 11/30/2020   HDL 42.70 11/30/2020   LDLCALC 70 12/22/2009   LDLDIRECT 59.0 11/30/2020   TRIG 278.0 (H) 11/30/2020   CHOLHDL 3 11/30/2020  11/25/2019: 151/268/43/65 On Crestor  5. I rec'd  fish oil 1000 mg daily after last visit >> tried >> indigestion.  - last eye exam was in 2024: + Glaucoma, no DR. Dr. Octavia.  She has a history of cataract surgery.  - no numbness and tingling in her feet.  Foot exam performed 06/24/2022.  Pt has FH of DM in father and brother.   Osteoporosis (OP):  I reviewed pt's DXA scans: Date L1-L4 T score FN T score 33% distal Radius  Ultra distal radius  06/01/2021 N/a RFN: -2.1 LFN: -2.4 -1.7 (-3.4%) -1.5  12/25/2017 N/a  RFN: -0.9 LFN: -2.1 -1.5 -1.8  FRAX: MOF 24.2%, 10-year hip fracture risk 7.2%  She has the following fractures: Left wrist - in her 75s Skull - 01/2019 - tripped in the driveway  We started Reclast : 1st dose 07/04/2021 2nd dose 07/09/2022 She tolerated it well, without jaw/hip/thigh pain.  No dizziness/vertigo/orthostasis/poor vision.   No previous OP treatments.  No h/o vitamin D  deficiency. Reviewed available vit D levels: 12/17/2022: vitamin D  28.7 05/2022: Vitamin D  was reportedly normal (I do not have these records) Lab Results  Component Value Date   VD25OH 26.22 (L) 06/18/2021  10/29/2017: Vitamin D  35  Pt is on: - vitamin D   - 2000 >> 5000 units daily>> then 2000 units -PCP  increased to 4000 units daily 12/2022.  No weight bearing exercises. R shoulder rotator cuff.   She does not take high vitamin A doses.  Menopause was at 78 y/o - postsurgical - HRT - stopped at 43.   FH of osteoporosis: sister - hip fracture.  No h/o persistent hyper/hypocalcemia or hyperparathyroidism. No h/o kidney stones. Lab Results  Component Value Date   CALCIUM  9.1 03/25/2022   CALCIUM  8.9 03/22/2022   CALCIUM  8.7 (L) 03/21/2022   CALCIUM  9.0 03/11/2022   CALCIUM  9.6 06/18/2021   CALCIUM  8.5 (L) 01/04/2020   CALCIUM  9.6 12/22/2019   CALCIUM  9.1 05/16/2019   CALCIUM  9.8 02/08/2019   CALCIUM  9.5 02/09/2012   No h/o thyrotoxicosis. Reviewed TSH recent levels:  Lab Results  Component Value Date   TSH 2.55 10/19/2009   TSH 1.55 05/09/2006   She has a history of GERD. She also has a history of HTN-on Lasix as needed, IBS with diarrhea, GERD, vaginal prolapse - h/o surgery. She also has NASH with stage 2 fibrosis- in a study.  She was previously in another study that was using a drug which helped her significantly (HbA1c dropped to 6.2%), however, the study was stopped and the drug is not  available.  She sees Dr. Ishmael for RA.  On Remicade . She had left TKR 01/03/2020 - had a lot of pain after the surgery.  She also had a DVT in the L leg - on Xarelto x 3 mo. She has repeated UTIs. She had shoulder surgery in 10/2021. She is seen in neurosurgery >> gets Lidocaine  injections. May need a nerve block.  ROS: + see HPI  I reviewed pt's medications, allergies, PMH, social hx, family hx, and changes were documented in the history of present illness. Otherwise, unchanged from my initial visit note.  Past Medical History:  Diagnosis Date   Anxiety    Arthritis    Chronic kidney disease    Coronary artery disease    nonobstructive by  cardiac catheterization 2011 with a 30% LAD lesion   Depression    Diabetes (HCC)    Type 2   GERD (gastroesophageal reflux disease)    Glaucoma    Hypercholesteremia    Hypertension    NASH (nonalcoholic steatohepatitis)    NASH   Pneumonia    30 years ago   PVC (premature ventricular contraction)    Seasonal allergies    Sleep apnea    Spinal stenosis    Past Surgical History:  Procedure Laterality Date   ANTERIOR AND POSTERIOR VAGINAL REPAIR     APPENDECTOMY  1959   BLADDER SUSPENSION     BUNIONECTOMY     CARDIAC CATHETERIZATION  2011   clean cath   CARDIOVASCULAR STRESS TEST  2022   CATARACT EXTRACTION Left 2015   CATARACT EXTRACTION W/ INTRAOCULAR LENS IMPLANT Right 2010   KNEE ARTHROSCOPY Left    NASAL RECONSTRUCTION  1976   ROBOTIC ASSISTED LAPAROSCOPIC SACROCOLPOPEXY     with mesh, urethral sling, posterior vaginal repair   SHOULDER ARTHROSCOPY Left    SHOULDER SURGERY Right 10/16/2021   repair   TOTAL ABDOMINAL HYSTERECTOMY  1976   TOTAL KNEE ARTHROPLASTY Left 01/03/2020   Procedure: TOTAL KNEE ARTHROPLASTY;  Surgeon: Melodi Lerner, MD;  Location: [facility];  Service: Orthopedics;  Laterality: Left;    TRANSFORAMINAL LUMBAR INTERBODY FUSION W/ MIS 2 LEVEL Right 03/14/2022   Procedure: Minimally Invasive Surgery  Decompression, Transforaminal Lumbar Interbody Fusion Lumbar three-four, Lumbar four-five;  Surgeon: Dawley, Lani BROCKS, DO;  Location: [facility];  Service: Neurosurgery;  Laterality: Right;   Social History   Socioeconomic History   Marital status: Married    Spouse name: Not on file   Number of children: 1   Years of education: Not on file   Highest education level: Not on file  Occupational History   Occupation: [employer] - retired    Associate Professor: [employer]     Comment: [employer]  Tobacco Use   Smoking status: Never Smoker   Smokeless tobacco: Never Used  Substance and Sexual Activity   Alcohol use: No   Drug use: No   Current Outpatient Medications on File Prior to Visit  Medication Sig Dispense Refill   ALPRAZolam  (XANAX ) 0.5 MG tablet Take 0.5 tablets (0.25 mg total) by mouth at bedtime as needed for anxiety. 30 tablet 0   aspirin  EC (ASPIRIN  81) 81 MG tablet Take 1 tablet (81 mg total) by mouth daily. 30 tablet 0   bisacodyl  (DULCOLAX) 5 MG EC tablet Take 1 tablet (5 mg total) by mouth daily as needed for moderate constipation. 30 tablet 0   brimonidine  (ALPHAGAN ) 0.2 % ophthalmic solution Place 1 drop into both eyes 2 (two) times daily.     Cholecalciferol  125 MCG (5000 UT) TABS Take 1 tablet (5,000 Units total) by mouth daily. (Patient taking differently: Take 10,000 Units by mouth daily. Take 2 a day) 30 tablet 0   Continuous Glucose Receiver (FREESTYLE LIBRE 2 READER) DEVI 1 each by Does not apply route daily. Use GoodRx 1 each 0   Continuous Glucose Sensor (FREESTYLE LIBRE 2 SENSOR) MISC 1 each by Does not apply route every 14 (fourteen) days. Use GoodRx 6 each 3   Cyanocobalamin  (VITAMIN B-12) 5000 MCG TBDP Take 1,000 mcg by mouth See admin instructions. Takes on Monday and Thursday 30 tablet 0   diclofenac  Sodium (VOLTAREN ) 1 % GEL Apply 2 g topically 4 (four) times daily. 100 g 0  dicyclomine  (BENTYL ) 10 MG capsule Take 1 capsule (10 mg total) by mouth 3 (three) times daily as  needed for spasms. 10 capsule 0   escitalopram  (LEXAPRO ) 10 MG tablet Take 1 tablet (10 mg total) by mouth at bedtime. 30 tablet 0   glipiZIDE  (GLUCOTROL  XL) 2.5 MG 24 hr tablet Take 2 tablets in the AM and 1 tablet in the evening. 270 tablet 1   glucose blood (ONETOUCH VERIO) test strip Use as instructed to check blood sugar 1X daily 100 each 3   hydrochlorothiazide  (HYDRODIURIL ) 12.5 MG tablet Take 1 tablet (12.5 mg total) by mouth daily. 30 tablet 0   latanoprost  (XALATAN ) 0.005 % ophthalmic solution Place 1 drop into both eyes at bedtime.     losartan  (COZAAR ) 25 MG tablet Take 1 tablet (25 mg total) by mouth at bedtime. 30 tablet 0   meclizine  (ANTIVERT ) 12.5 MG tablet Take 1 tablet (12.5 mg total) by mouth 3 (three) times daily as needed for dizziness. 21 tablet 0   methocarbamol  (ROBAXIN ) 500 MG tablet Take 1 tablet (500 mg total) by mouth every 6 (six) hours as needed for muscle spasms. 60 tablet 0   omeprazole  (PRILOSEC) 20 MG capsule Take 1 capsule (20 mg total) by mouth daily as needed (acid reflux/indigestion). 30 capsule 0   Oxycodone  HCl 10 MG TABS Take 1 tablet (10 mg total) by mouth every 4 (four) hours as needed ((score 7 to 10)). 30 tablet 0   polyethylene glycol (MIRALAX  / GLYCOLAX ) 17 g packet Take 17 g by mouth daily. 14 each 0   rosuvastatin  (CRESTOR ) 5 MG tablet Take 1 tablet (5 mg total) by mouth at bedtime. 30 tablet 0   timolol  (TIMOPTIC ) 0.5 % ophthalmic solution Place 1 drop into both eyes daily.     Current Facility-Administered Medications on File Prior to Visit  Medication Dose Route Frequency Provider Last Rate Last Admin   regadenoson  (LEXISCAN ) injection SOLN 0.4 mg  0.4 mg Intravenous Once Lonni Slain, MD       technetium tetrofosmin  (TC-MYOVIEW ) injection 30.9 millicurie  30.9 millicurie Intravenous Once PRN Lonni Slain, MD       Allergies  Allergen Reactions   Metformin  And Related Diarrhea   Gabapentin Palpitations   Hydrocodone   Itching   Plaquenil [Hydroxychloroquine] Nausea And Vomiting   Tylenol  [Acetaminophen ] Other (See Comments)    History of Liver Disease   Sulfamethoxazole-Trimethoprim Rash   Tape Rash   Family History  Problem Relation Age of Onset   Heart disease Father        open heart surgery at 54   Colon cancer Father    Heart disease Mother        open heart surgery at age 36   Hypertension Mother    Heart attack Maternal Grandfather    Stroke Paternal Grandmother    Heart block Brother    PE: BP 136/80   Pulse 70   Ht 5' 6 (1.676 m)   Wt 182 lb 12.8 oz (82.9 kg)   SpO2 98%   BMI 29.50 kg/m   Wt Readings from Last 3 Encounters:  03/13/23 182 lb 12.8 oz (82.9 kg)  12/26/22 172 lb (78 kg)  09/12/22 172 lb 9.6 oz (78.3 kg)   Constitutional: overweight, in NAD Eyes:  EOMI, no exophthalmos ENT: no neck masses, no cervical lymphadenopathy Cardiovascular: RRR, No MRG Respiratory: CTA B Musculoskeletal: no deformities Skin:no rashes Neurological: no tremor with outstretched hands  ASSESSMENT: 1. DM2, non-insulin -dependent, uncontrolled, with  long-term complications - CAD - CKD stage III - NASH  2. HL  3. OP  4.  Vitamin D  deficiency  PLAN:  1. Patient with improving type 2 diabetes control on a minimalistic oral antidiabetic regimen with sulfonylurea only.  Unfortunately we could not continue Farxiga  or switch to Jardiance  due to price.  She also has a history of UTIs and I am not sure if she would be able to tolerate these even if they were affordable.  At last visit, HbA1c was slightly higher, at 6.8%, however, she was eating many sweets and we discussed about reducing these.  She was also taking glipizide  after meals and I advised her to move this before meals. -At today's visit, she tells me that she had another HbA1c obtained approximately 2 weeks ago in her rheumatologist office and this was 7.3%, higher.  She is taking the glipizide  3 times a day, before every meal,  instead of twice a day as recommended.  As a consequence of taking this before lunch, she is dropping her blood sugars in the afternoon.  She occasionally also has some lower blood sugars at night.  At today's visit we discussed again about only taking it twice a day, before breakfast and dinner, but we also again discussed about the possibility of using an SGLT2 inhibitor on a GLP-1 receptor agonist.  Her husband is obtaining Ozempic through the [employer].  We discussed about getting on his insurance if possible.  She will look into this. -I suggested to: Patient Instructions  Please reduce: - Glipizide  XL 2.5 mg 2x daily before b'fast and before dinner  Please return in 4 months.  - advised to check sugars at different times of the day - 1x a day, rotating check times - advised for yearly eye exams >> she is UTD - return to clinic in 3-4 months  2. HL -Reviewed latest lipid panel from 12/20/2022: Triglycerides elevated but improved, LDL above target:157/186/50/76 -She continues Crestor  5 mg daily without side effects. We tried fish oil 1000 mg daily but developed indigestion and stopped  3. OP -Likely menopausal/age-related and she also has a family history of osteoporosis -Her bone density scores from 2023 showed a high risk for fracture reflected in the FRAX score. -Discussed about fall precautions, weightbearing exercises, optimizing vitamin D  levels and we started Reclast  -she had 1 dose in 06/2021 and another dose in 06/2022.  She tolerates them well. -We discussed about continuing with another dose this spring. -Plan to check another bone density in 3 years from the previous  4.  Vitamin D  insufficiency -She was on 5000 units vitamin D  daily, but at today's visit tells me that she was on 2000 units daily (she believes). Vitamin D  level was slightly low at last check  (see HPI). PCP advised her at that time to double up on the dose of her vitamin D  to 4000 units daily, which she  started approximately 4 days prior to our last appointment -She is due for another level -further follow-up per PCP  Lela Fendt, MD PhD [facility]

## 2023-03-17 DIAGNOSIS — M0609 Rheumatoid arthritis without rheumatoid factor, multiple sites: Secondary | ICD-10-CM | POA: Diagnosis not present

## 2023-03-18 ENCOUNTER — Other Ambulatory Visit: Payer: Self-pay | Admitting: Internal Medicine

## 2023-03-24 DIAGNOSIS — M47816 Spondylosis without myelopathy or radiculopathy, lumbar region: Secondary | ICD-10-CM | POA: Diagnosis not present

## 2023-03-31 DIAGNOSIS — M0609 Rheumatoid arthritis without rheumatoid factor, multiple sites: Secondary | ICD-10-CM | POA: Diagnosis not present

## 2023-04-10 DIAGNOSIS — Z09 Encounter for follow-up examination after completed treatment for conditions other than malignant neoplasm: Secondary | ICD-10-CM | POA: Diagnosis not present

## 2023-04-10 DIAGNOSIS — K5730 Diverticulosis of large intestine without perforation or abscess without bleeding: Secondary | ICD-10-CM | POA: Diagnosis not present

## 2023-04-10 DIAGNOSIS — Z860100 Personal history of colon polyps, unspecified: Secondary | ICD-10-CM | POA: Diagnosis not present

## 2023-04-10 DIAGNOSIS — Z800 Family history of malignant neoplasm of digestive organs: Secondary | ICD-10-CM | POA: Diagnosis not present

## 2023-04-14 DIAGNOSIS — M47816 Spondylosis without myelopathy or radiculopathy, lumbar region: Secondary | ICD-10-CM | POA: Diagnosis not present

## 2023-04-18 DIAGNOSIS — R399 Unspecified symptoms and signs involving the genitourinary system: Secondary | ICD-10-CM | POA: Diagnosis not present

## 2023-04-18 DIAGNOSIS — N390 Urinary tract infection, site not specified: Secondary | ICD-10-CM | POA: Diagnosis not present

## 2023-04-25 ENCOUNTER — Ambulatory Visit: Payer: Medicare Other | Admitting: Internal Medicine

## 2023-04-28 DIAGNOSIS — M0609 Rheumatoid arthritis without rheumatoid factor, multiple sites: Secondary | ICD-10-CM | POA: Diagnosis not present

## 2023-04-29 DIAGNOSIS — N183 Chronic kidney disease, stage 3 unspecified: Secondary | ICD-10-CM | POA: Diagnosis not present

## 2023-04-29 DIAGNOSIS — E11649 Type 2 diabetes mellitus with hypoglycemia without coma: Secondary | ICD-10-CM | POA: Diagnosis not present

## 2023-04-29 DIAGNOSIS — I10 Essential (primary) hypertension: Secondary | ICD-10-CM | POA: Diagnosis not present

## 2023-04-29 DIAGNOSIS — I700 Atherosclerosis of aorta: Secondary | ICD-10-CM | POA: Diagnosis not present

## 2023-05-05 DIAGNOSIS — I10 Essential (primary) hypertension: Secondary | ICD-10-CM | POA: Diagnosis not present

## 2023-05-05 DIAGNOSIS — E7800 Pure hypercholesterolemia, unspecified: Secondary | ICD-10-CM | POA: Diagnosis not present

## 2023-05-05 DIAGNOSIS — N183 Chronic kidney disease, stage 3 unspecified: Secondary | ICD-10-CM | POA: Diagnosis not present

## 2023-05-05 DIAGNOSIS — E11649 Type 2 diabetes mellitus with hypoglycemia without coma: Secondary | ICD-10-CM | POA: Diagnosis not present

## 2023-05-13 ENCOUNTER — Ambulatory Visit: Payer: Medicare Other | Admitting: Internal Medicine

## 2023-05-14 DIAGNOSIS — M5416 Radiculopathy, lumbar region: Secondary | ICD-10-CM | POA: Diagnosis not present

## 2023-05-14 DIAGNOSIS — Z6829 Body mass index (BMI) 29.0-29.9, adult: Secondary | ICD-10-CM | POA: Diagnosis not present

## 2023-05-15 ENCOUNTER — Emergency Department (HOSPITAL_COMMUNITY)

## 2023-05-15 ENCOUNTER — Other Ambulatory Visit: Payer: Self-pay

## 2023-05-15 ENCOUNTER — Emergency Department (HOSPITAL_COMMUNITY)
Admission: EM | Admit: 2023-05-15 | Discharge: 2023-05-15 | Disposition: A | Discharge status: Home / Self Care | Attending: Emergency Medicine | Admitting: Emergency Medicine

## 2023-05-15 DIAGNOSIS — R7401 Elevation of levels of liver transaminase levels: Secondary | ICD-10-CM | POA: Insufficient documentation

## 2023-05-15 DIAGNOSIS — Z79899 Other long term (current) drug therapy: Secondary | ICD-10-CM | POA: Insufficient documentation

## 2023-05-15 DIAGNOSIS — N281 Cyst of kidney, acquired: Secondary | ICD-10-CM | POA: Diagnosis not present

## 2023-05-15 DIAGNOSIS — K5732 Diverticulitis of large intestine without perforation or abscess without bleeding: Secondary | ICD-10-CM | POA: Insufficient documentation

## 2023-05-15 DIAGNOSIS — K5792 Diverticulitis of intestine, part unspecified, without perforation or abscess without bleeding: Secondary | ICD-10-CM | POA: Diagnosis not present

## 2023-05-15 DIAGNOSIS — R932 Abnormal findings on diagnostic imaging of liver and biliary tract: Secondary | ICD-10-CM | POA: Diagnosis not present

## 2023-05-15 DIAGNOSIS — R799 Abnormal finding of blood chemistry, unspecified: Secondary | ICD-10-CM | POA: Diagnosis not present

## 2023-05-15 DIAGNOSIS — E119 Type 2 diabetes mellitus without complications: Secondary | ICD-10-CM | POA: Insufficient documentation

## 2023-05-15 DIAGNOSIS — I10 Essential (primary) hypertension: Secondary | ICD-10-CM | POA: Insufficient documentation

## 2023-05-15 DIAGNOSIS — I2510 Atherosclerotic heart disease of native coronary artery without angina pectoris: Secondary | ICD-10-CM | POA: Insufficient documentation

## 2023-05-15 DIAGNOSIS — Z7982 Long term (current) use of aspirin: Secondary | ICD-10-CM | POA: Insufficient documentation

## 2023-05-15 DIAGNOSIS — D72829 Elevated white blood cell count, unspecified: Secondary | ICD-10-CM | POA: Insufficient documentation

## 2023-05-15 DIAGNOSIS — Z7984 Long term (current) use of oral hypoglycemic drugs: Secondary | ICD-10-CM | POA: Insufficient documentation

## 2023-05-15 DIAGNOSIS — R1011 Right upper quadrant pain: Secondary | ICD-10-CM | POA: Diagnosis not present

## 2023-05-15 DIAGNOSIS — R109 Unspecified abdominal pain: Secondary | ICD-10-CM | POA: Diagnosis not present

## 2023-05-15 LAB — COMPREHENSIVE METABOLIC PANEL WITH GFR
ALT: 53 U/L — ABNORMAL HIGH (ref 0–44)
AST: 33 U/L (ref 15–41)
Albumin: 4 g/dL (ref 3.5–5.0)
Alkaline Phosphatase: 210 U/L — ABNORMAL HIGH (ref 38–126)
Anion gap: 8 (ref 5–15)
BUN: 26 mg/dL — ABNORMAL HIGH (ref 8–23)
CO2: 26 mmol/L (ref 22–32)
Calcium: 9.7 mg/dL (ref 8.9–10.3)
Chloride: 101 mmol/L (ref 98–111)
Creatinine, Ser: 0.95 mg/dL (ref 0.44–1.00)
GFR, Estimated: >60 mL/min (ref >60)
Glucose, Bld: 115 mg/dL — ABNORMAL HIGH (ref 70–99)
Potassium: 3.5 mmol/L (ref 3.5–5.1)
Sodium: 135 mmol/L (ref 135–145)
Total Bilirubin: 0.7 mg/dL (ref 0.0–1.2)
Total Protein: 8 g/dL (ref 6.5–8.1)

## 2023-05-15 LAB — CBG MONITORING, ED
Glucose-Capillary: 71 mg/dL (ref 70–99)
Glucose-Capillary: 72 mg/dL (ref 70–99)

## 2023-05-15 LAB — URINALYSIS, ROUTINE W REFLEX MICROSCOPIC
Bilirubin Urine: NEGATIVE
Glucose, UA: NEGATIVE mg/dL
Hgb urine dipstick: NEGATIVE
Ketones, ur: NEGATIVE mg/dL
Leukocytes,Ua: NEGATIVE
Nitrite: NEGATIVE
Protein, ur: NEGATIVE mg/dL
Specific Gravity, Urine: 1.005 (ref 1.005–1.030)
pH: 5 (ref 5.0–8.0)

## 2023-05-15 LAB — CBC WITH DIFFERENTIAL/PLATELET
Abs Immature Granulocytes: 0.07 10*3/uL (ref 0.00–0.07)
Basophils Absolute: 0 10*3/uL (ref 0.0–0.1)
Basophils Relative: 0 %
Eosinophils Absolute: 0.1 10*3/uL (ref 0.0–0.5)
Eosinophils Relative: 1 %
HCT: 39.4 % (ref 36.0–46.0)
Hemoglobin: 12.7 g/dL (ref 12.0–15.0)
Immature Granulocytes: 1 %
Lymphocytes Relative: 25 %
Lymphs Abs: 3.1 10*3/uL (ref 0.7–4.0)
MCH: 29.4 pg (ref 26.0–34.0)
MCHC: 32.2 g/dL (ref 30.0–36.0)
MCV: 91.2 fL (ref 80.0–100.0)
Monocytes Absolute: 1 10*3/uL (ref 0.1–1.0)
Monocytes Relative: 8 %
Neutro Abs: 8.2 10*3/uL — ABNORMAL HIGH (ref 1.7–7.7)
Neutrophils Relative %: 65 %
Platelets: 214 10*3/uL (ref 150–400)
RBC: 4.32 MIL/uL (ref 3.87–5.11)
RDW: 13.2 % (ref 11.5–15.5)
WBC: 12.5 10*3/uL — ABNORMAL HIGH (ref 4.0–10.5)
nRBC: 0 % (ref 0.0–0.2)

## 2023-05-15 LAB — LIPASE, BLOOD: Lipase: 36 U/L (ref 11–51)

## 2023-05-15 MED ORDER — AMOXICILLIN-POT CLAVULANATE 875-125 MG PO TABS: 1.0000 | ORAL_TABLET | Freq: Two times a day (BID) | ORAL | 0 refills | Status: DC

## 2023-05-15 MED ORDER — GLUCOSE 40 % PO GEL
1.0000 | Freq: Once | ORAL | Status: AC
  Administered 2023-05-15: 31 g via ORAL
  Filled 2023-05-15: qty 1

## 2023-05-15 MED ORDER — IOHEXOL 300 MG/ML  SOLN
100.0000 mL | Freq: Once | INTRAMUSCULAR | Status: AC | PRN
  Administered 2023-05-15: 100 mL via INTRAVENOUS

## 2023-05-15 MED ORDER — METRONIDAZOLE 500 MG PO TABS: 500.0000 mg | ORAL_TABLET | Freq: Two times a day (BID) | ORAL | 0 refills | Status: DC

## 2023-05-15 MED ORDER — ONDANSETRON HCL 4 MG/2ML IJ SOLN
4.0000 mg | Freq: Once | INTRAMUSCULAR | Status: AC
  Administered 2023-05-15: 4 mg via INTRAVENOUS
  Filled 2023-05-15: qty 2

## 2023-05-15 MED ORDER — LIDOCAINE VISCOUS HCL 2 % MT SOLN
15.0000 mL | Freq: Once | OROMUCOSAL | Status: DC
  Filled 2023-05-15: qty 15

## 2023-05-15 MED ORDER — FLUCONAZOLE 150 MG PO TABS: 150.0000 mg | ORAL_TABLET | Freq: Every day | ORAL | 0 refills | Status: DC

## 2023-05-15 MED ORDER — MORPHINE SULFATE (PF) 4 MG/ML IV SOLN
4.0000 mg | Freq: Once | INTRAVENOUS | Status: AC
  Administered 2023-05-15: 4 mg via INTRAVENOUS
  Filled 2023-05-15: qty 1

## 2023-05-15 MED ORDER — ALUM & MAG HYDROXIDE-SIMETH 200-200-20 MG/5ML PO SUSP
30.0000 mL | Freq: Once | ORAL | Status: DC
  Filled 2023-05-15: qty 30

## 2023-05-15 NOTE — ED Notes (Signed)
 Pt to CT via stretcher

## 2023-05-15 NOTE — ED Provider Notes (Signed)
 [place] [facility]GENCY DEPARTMENT AT [facility] Provider Note   CSN: [id number] Arrival date & time: 05/15/23  1118     History  Chief Complaint  Patient presents with   Abdominal Pain    Right upper quadrant     Tammy Pineda is a 78 y.o. female with past medical history significant for hypertension, GERD, hyperlipidemia, coronary artery disease, diabetes on glipizide who presents with concern for right upper quadrant abdominal pain for the last several days, worse after eating, she was seen by her primary care doctor and especially after report of possible fever up to 103 recently although no fever today they sent her to the ED due to concern for developing cholangitis versus other acute gallbladder pathology.  She reports no change in quality of her stool, but she does report that she has had some darker urine, she reports decreased oral intake secondary to abdominal pain.   Abdominal Pain      Home Medications Prior to Admission medications   Medication Sig Start Date End Date Taking? Authorizing Provider  ALPRAZolam (XANAX) 0.5 MG tablet Take 0.5 tablets (0.25 mg total) by mouth at bedtime as needed for anxiety. 03/28/22   Angiulli, Everlyn Hockey, PA-C  aspirin EC (ASPIRIN 81) 81 MG tablet Take 1 tablet (81 mg total) by mouth daily. 03/29/22   Dawley, Troy C, DO  bisacodyl (DULCOLAX) 5 MG EC tablet Take 1 tablet (5 mg total) by mouth daily as needed for moderate constipation. 03/28/22   Angiulli, Everlyn Hockey, PA-C  brimonidine (ALPHAGAN) 0.2 % ophthalmic solution Place 1 drop into both eyes 2 (two) times daily.    [provider]  Cholecalciferol 125 MCG (5000 UT) TABS Take 1 tablet (5,000 Units total) by mouth daily. Patient taking differently: Take 10,000 Units by mouth daily. Take 2 a day 03/28/22   Sterling Eisenmenger, PA-C  Continuous Glucose Receiver (FREESTYLE LIBRE 2 READER) DEVI 1 each by Does not apply route daily. Use GoodRx 02/13/23   Emilie Harden, MD   Continuous Glucose Sensor (FREESTYLE LIBRE 2 SENSOR) MISC 1 each by Does not apply route every 14 (fourteen) days. Use GoodRx 02/13/23   Emilie Harden, MD  Cyanocobalamin (VITAMIN B-12) 5000 MCG TBDP Take 1,000 mcg by mouth See admin instructions. Takes on Monday and Thursday 03/28/22   Angiulli, Everlyn Hockey, PA-C  diclofenac Sodium (VOLTAREN) 1 % GEL Apply 2 g topically 4 (four) times daily. 03/28/22   Angiulli, Everlyn Hockey, PA-C  dicyclomine (BENTYL) 10 MG capsule Take 1 capsule (10 mg total) by mouth 3 (three) times daily as needed for spasms. 03/28/22   Angiulli, Everlyn Hockey, PA-C  escitalopram (LEXAPRO) 10 MG tablet Take 1 tablet (10 mg total) by mouth at bedtime. 03/28/22   Angiulli, Everlyn Hockey, PA-C  glipiZIDE (GLUCOTROL XL) 2.5 MG 24 hr tablet TAKE 1 TABLET BY MOUTH 2 TIMES DAILY BEFORE MEALS 03/18/23   Emilie Harden, MD  glucose blood (ONETOUCH VERIO) test strip Use as instructed to check blood sugar 1X daily 04/08/22   Emilie Harden, MD  hydrochlorothiazide (HYDRODIURIL) 12.5 MG tablet Take 1 tablet (12.5 mg total) by mouth daily. 03/28/22   Angiulli, Everlyn Hockey, PA-C  latanoprost (XALATAN) 0.005 % ophthalmic solution Place 1 drop into both eyes at bedtime.    [provider]  losartan (COZAAR) 25 MG tablet Take 1 tablet (25 mg total) by mouth at bedtime. 03/28/22   Angiulli, Everlyn Hockey, PA-C  meclizine (ANTIVERT) 12.5 MG tablet Take 1 tablet (12.5  mg total) by mouth 3 (three) times daily as needed for dizziness. 03/28/22   Angiulli, Everlyn Hockey, PA-C  methocarbamol (ROBAXIN) 500 MG tablet Take 1 tablet (500 mg total) by mouth every 6 (six) hours as needed for muscle spasms. 03/28/22   Angiulli, Everlyn Hockey, PA-C  omeprazole (PRILOSEC) 20 MG capsule Take 1 capsule (20 mg total) by mouth daily as needed (acid reflux/indigestion). 03/28/22   Angiulli, Everlyn Hockey, PA-C  Oxycodone HCl 10 MG TABS Take 1 tablet (10 mg total) by mouth every 4 (four) hours as needed ((score 7 to 10)). 03/28/22   Angiulli, Everlyn Hockey,  PA-C  polyethylene glycol (MIRALAX / GLYCOLAX) 17 g packet Take 17 g by mouth daily. 03/28/22   Angiulli, Everlyn Hockey, PA-C  rosuvastatin (CRESTOR) 5 MG tablet Take 1 tablet (5 mg total) by mouth at bedtime. 03/28/22   Angiulli, Everlyn Hockey, PA-C  timolol (TIMOPTIC) 0.5 % ophthalmic solution Place 1 drop into both eyes daily. 08/19/19   [provider]      Allergies    Metformin and related, Gabapentin, Hydrocodone, Plaquenil [hydroxychloroquine], Tylenol [acetaminophen], Sulfamethoxazole-trimethoprim, and Tape    Review of Systems   Review of Systems  Gastrointestinal:  Positive for abdominal pain.  All other systems reviewed and are negative.   Physical Exam Updated Vital Signs BP (!) 110/59   Pulse 68   Temp 98.7 F (37.1 C) (Oral)   Resp 17   Wt 82.6 kg   SpO2 98%   BMI 29.38 kg/m  Physical Exam Vitals and nursing note reviewed.  Constitutional:      General: She is not in acute distress.    Appearance: Normal appearance.  HENT:     Head: Normocephalic and atraumatic.  Eyes:     General:        Right eye: No discharge.        Left eye: No discharge.  Cardiovascular:     Rate and Rhythm: Normal rate and regular rhythm.     Heart sounds: No murmur heard.    No friction rub. No gallop.  Pulmonary:     Effort: Pulmonary effort is normal.     Breath sounds: Normal breath sounds.  Abdominal:     General: Bowel sounds are normal.     Palpations: Abdomen is soft.     Comments: Focal tenderness to palpation epigastric and right upper quadrant region, positive Murphy sign, no significant tenderness throughout the rest of the abdominal quadrants, normal bowel sounds throughout.  Skin:    General: Skin is warm and dry.     Capillary Refill: Capillary refill takes less than 2 seconds.  Neurological:     Mental Status: She is alert and oriented to person, place, and time.  Psychiatric:        Mood and Affect: Mood normal.        Behavior: Behavior normal.     ED  Results / Procedures / Treatments   Labs (all labs ordered are listed, but only abnormal results are displayed) Labs Reviewed  CBC WITH DIFFERENTIAL/PLATELET - Abnormal; Notable for the following components:      Result Value   WBC 12.5 (*)    Neutro Abs 8.2 (*)    All other components within normal limits  COMPREHENSIVE METABOLIC PANEL WITH GFR  CBG MONITORING, ED    EKG None  Radiology No results found.  Procedures Procedures    Medications Ordered in ED Medications - No data to display  ED Course/ Medical  Decision Making/ A&P                                 Medical Decision Making Amount and/or Complexity of Data Reviewed Labs: ordered. Radiology: ordered.  Risk OTC drugs. Prescription drug management.   This patient is a 78 y.o. female  who presents to the ED for concern of abdominal pain, nausea, vomiting.   Differential diagnoses prior to evaluation: The emergent differential diagnosis includes, but is not limited to,  The causes of generalized abdominal pain include but are not limited to AAA, mesenteric ischemia, appendicitis, diverticulitis, DKA, gastritis, gastroenteritis, AMI, nephrolithiasis, pancreatitis, peritonitis, adrenal insufficiency,lead poisoning, iron toxicity, intestinal ischemia, constipation, UTI,SBO/LBO, splenic rupture, biliary disease, IBD, IBS, PUD, or hepatitis . This is not an exhaustive differential.   Past Medical History / Co-morbidities / Social History: hypertension, GERD, hyperlipidemia, coronary artery disease, diabetes  Additional history: Chart reviewed. Pertinent results include: reviewed outpatient visit with PCP just prior to arrival  Physical Exam: Physical exam performed. The pertinent findings include: Some focal tenderness in the epigastric to right upper quadrant region.  Positive Murphy sign on my exam.  She has some mild hypertension, blood pressure 147/55.  Otherwise stable vital signs, no fever.  Lab  Tests/Imaging studies: I personally interpreted labs/imaging and the pertinent results include: White blood cell count elevated 12.5, otherwise unremarkable CBC, normal lipase, CMP notable for mildly elevated BUN at 26 but normal creatinine 0.95, she has some mildly elevated liver enzymes, ALT 53, alkaline phosphatase 210, total bilirubin 0.7, some elevation but not impressive for acute cholestatic pathology.  UA unremarkable.  I independently interpreted right upper quadrant ultrasound which shows some hepatic steatosis but no evidence of acute gallbladder pathology, gallbladder wall thickening.  I independently interpreted CT abdomen pelvis which shows acute noncomplicated cecal diverticulitis, no perforation, no abscess. I agree with the radiologist interpretation.   Medications: I ordered medication including morphine, Zofran for pain, dextrose for low blood sugar on arrival.  I have reviewed the patients home medicines and have made adjustments as needed.   Disposition: After consideration of the diagnostic results and the patients response to treatment, I feel that I had a shared decision made conversation the patient about treatment, she reports that she still is having some pain but feels comfortable going home on antibiotics, she has nausea and pain medicine at home already.  Plan for clear liquid diet, antibiotics and close PCP follow-up.  Discussed extensive return precautions, patient discharged in stable condition.Aaron Aas   emergency department workup does not suggest an emergent condition requiring admission or immediate intervention beyond what has been performed at this time. The plan is: as above. The patient is safe for discharge and has been instructed to return immediately for worsening symptoms, change in symptoms or any other concerns.  Final Clinical Impression(s) / ED Diagnoses Final diagnoses:  None    Rx / DC Orders ED Discharge Orders     None         Stefan Edge 05/15/23 1729    Lind Repine, MD 05/20/23 1243

## 2023-05-15 NOTE — ED Triage Notes (Signed)
 Pt was seen by her primary provider for right abdominal pain. Sent to the ER for Gallbladder attack.

## 2023-05-15 NOTE — Discharge Instructions (Signed)
 I would stick to a clear liquid diet for around 5 days, proceeding slowly to more solid foods. If you begin to have worsening abdominal pain, go back to liquids until abdominal pain subsides again. Take the entire course of antibiotics that we have prescribed, you can use the Diflucan as needed for developing yeast infection.  Please follow-up closely with your primary care doctor to ensure that your symptoms are improving.  Please return to the emergency department if you have worsening abdominal pain despite treatment.  You can use your home hydrocodone and Zofran as needed for pain, nausea.

## 2023-05-26 DIAGNOSIS — Z6829 Body mass index (BMI) 29.0-29.9, adult: Secondary | ICD-10-CM | POA: Diagnosis not present

## 2023-05-26 DIAGNOSIS — N3000 Acute cystitis without hematuria: Secondary | ICD-10-CM | POA: Diagnosis not present

## 2023-05-26 DIAGNOSIS — K5792 Diverticulitis of intestine, part unspecified, without perforation or abscess without bleeding: Secondary | ICD-10-CM | POA: Diagnosis not present

## 2023-05-29 DIAGNOSIS — E11649 Type 2 diabetes mellitus with hypoglycemia without coma: Secondary | ICD-10-CM | POA: Diagnosis not present

## 2023-05-29 DIAGNOSIS — I700 Atherosclerosis of aorta: Secondary | ICD-10-CM | POA: Diagnosis not present

## 2023-05-29 DIAGNOSIS — N183 Chronic kidney disease, stage 3 unspecified: Secondary | ICD-10-CM | POA: Diagnosis not present

## 2023-05-29 DIAGNOSIS — I10 Essential (primary) hypertension: Secondary | ICD-10-CM | POA: Diagnosis not present

## 2023-06-03 DIAGNOSIS — Z79899 Other long term (current) drug therapy: Secondary | ICD-10-CM | POA: Diagnosis not present

## 2023-06-03 DIAGNOSIS — M48061 Spinal stenosis, lumbar region without neurogenic claudication: Secondary | ICD-10-CM | POA: Diagnosis not present

## 2023-06-03 DIAGNOSIS — M1991 Primary osteoarthritis, unspecified site: Secondary | ICD-10-CM | POA: Diagnosis not present

## 2023-06-03 DIAGNOSIS — Z6829 Body mass index (BMI) 29.0-29.9, adult: Secondary | ICD-10-CM | POA: Diagnosis not present

## 2023-06-03 DIAGNOSIS — E663 Overweight: Secondary | ICD-10-CM | POA: Diagnosis not present

## 2023-06-03 DIAGNOSIS — M0609 Rheumatoid arthritis without rheumatoid factor, multiple sites: Secondary | ICD-10-CM | POA: Diagnosis not present

## 2023-06-04 DIAGNOSIS — C44329 Squamous cell carcinoma of skin of other parts of face: Secondary | ICD-10-CM | POA: Diagnosis not present

## 2023-06-04 DIAGNOSIS — I700 Atherosclerosis of aorta: Secondary | ICD-10-CM | POA: Diagnosis not present

## 2023-06-04 DIAGNOSIS — E11649 Type 2 diabetes mellitus with hypoglycemia without coma: Secondary | ICD-10-CM | POA: Diagnosis not present

## 2023-06-04 DIAGNOSIS — N183 Chronic kidney disease, stage 3 unspecified: Secondary | ICD-10-CM | POA: Diagnosis not present

## 2023-06-04 DIAGNOSIS — Z85828 Personal history of other malignant neoplasm of skin: Secondary | ICD-10-CM | POA: Diagnosis not present

## 2023-06-04 DIAGNOSIS — L814 Other melanin hyperpigmentation: Secondary | ICD-10-CM | POA: Diagnosis not present

## 2023-06-04 DIAGNOSIS — B351 Tinea unguium: Secondary | ICD-10-CM | POA: Diagnosis not present

## 2023-06-04 DIAGNOSIS — L570 Actinic keratosis: Secondary | ICD-10-CM | POA: Diagnosis not present

## 2023-06-04 DIAGNOSIS — I10 Essential (primary) hypertension: Secondary | ICD-10-CM | POA: Diagnosis not present

## 2023-06-04 DIAGNOSIS — E7800 Pure hypercholesterolemia, unspecified: Secondary | ICD-10-CM | POA: Diagnosis not present

## 2023-06-04 DIAGNOSIS — L821 Other seborrheic keratosis: Secondary | ICD-10-CM | POA: Diagnosis not present

## 2023-06-04 DIAGNOSIS — D1801 Hemangioma of skin and subcutaneous tissue: Secondary | ICD-10-CM | POA: Diagnosis not present

## 2023-06-17 DIAGNOSIS — M47816 Spondylosis without myelopathy or radiculopathy, lumbar region: Secondary | ICD-10-CM | POA: Diagnosis not present

## 2023-06-19 DIAGNOSIS — E7800 Pure hypercholesterolemia, unspecified: Secondary | ICD-10-CM | POA: Diagnosis not present

## 2023-06-19 DIAGNOSIS — E538 Deficiency of other specified B group vitamins: Secondary | ICD-10-CM | POA: Diagnosis not present

## 2023-06-19 DIAGNOSIS — R413 Other amnesia: Secondary | ICD-10-CM | POA: Diagnosis not present

## 2023-06-19 DIAGNOSIS — I10 Essential (primary) hypertension: Secondary | ICD-10-CM | POA: Diagnosis not present

## 2023-06-19 DIAGNOSIS — E559 Vitamin D deficiency, unspecified: Secondary | ICD-10-CM | POA: Diagnosis not present

## 2023-06-19 DIAGNOSIS — E1122 Type 2 diabetes mellitus with diabetic chronic kidney disease: Secondary | ICD-10-CM | POA: Diagnosis not present

## 2023-06-19 DIAGNOSIS — F419 Anxiety disorder, unspecified: Secondary | ICD-10-CM | POA: Diagnosis not present

## 2023-06-19 DIAGNOSIS — K219 Gastro-esophageal reflux disease without esophagitis: Secondary | ICD-10-CM | POA: Diagnosis not present

## 2023-06-20 ENCOUNTER — Other Ambulatory Visit: Payer: Self-pay | Admitting: Family Medicine

## 2023-06-20 DIAGNOSIS — R413 Other amnesia: Secondary | ICD-10-CM

## 2023-06-23 DIAGNOSIS — M0609 Rheumatoid arthritis without rheumatoid factor, multiple sites: Secondary | ICD-10-CM | POA: Diagnosis not present

## 2023-07-02 DIAGNOSIS — N183 Chronic kidney disease, stage 3 unspecified: Secondary | ICD-10-CM | POA: Diagnosis not present

## 2023-07-02 DIAGNOSIS — E11649 Type 2 diabetes mellitus with hypoglycemia without coma: Secondary | ICD-10-CM | POA: Diagnosis not present

## 2023-07-02 DIAGNOSIS — I10 Essential (primary) hypertension: Secondary | ICD-10-CM | POA: Diagnosis not present

## 2023-07-02 DIAGNOSIS — I700 Atherosclerosis of aorta: Secondary | ICD-10-CM | POA: Diagnosis not present

## 2023-07-04 ENCOUNTER — Ambulatory Visit
Admission: RE | Admit: 2023-07-04 | Discharge: 2023-07-04 | Disposition: A | Discharge status: Home / Self Care | Source: Ambulatory Visit | Attending: Family Medicine | Admitting: Family Medicine

## 2023-07-04 DIAGNOSIS — I6782 Cerebral ischemia: Secondary | ICD-10-CM | POA: Diagnosis not present

## 2023-07-04 DIAGNOSIS — R413 Other amnesia: Secondary | ICD-10-CM

## 2023-07-05 DIAGNOSIS — N183 Chronic kidney disease, stage 3 unspecified: Secondary | ICD-10-CM | POA: Diagnosis not present

## 2023-07-05 DIAGNOSIS — I700 Atherosclerosis of aorta: Secondary | ICD-10-CM | POA: Diagnosis not present

## 2023-07-05 DIAGNOSIS — E11649 Type 2 diabetes mellitus with hypoglycemia without coma: Secondary | ICD-10-CM | POA: Diagnosis not present

## 2023-07-05 DIAGNOSIS — I10 Essential (primary) hypertension: Secondary | ICD-10-CM | POA: Diagnosis not present

## 2023-07-05 DIAGNOSIS — E7800 Pure hypercholesterolemia, unspecified: Secondary | ICD-10-CM | POA: Diagnosis not present

## 2023-07-08 DIAGNOSIS — Z85828 Personal history of other malignant neoplasm of skin: Secondary | ICD-10-CM | POA: Diagnosis not present

## 2023-07-08 DIAGNOSIS — C44329 Squamous cell carcinoma of skin of other parts of face: Secondary | ICD-10-CM | POA: Diagnosis not present

## 2023-07-09 DIAGNOSIS — H16223 Keratoconjunctivitis sicca, not specified as Sjogren's, bilateral: Secondary | ICD-10-CM | POA: Diagnosis not present

## 2023-07-09 DIAGNOSIS — Z961 Presence of intraocular lens: Secondary | ICD-10-CM | POA: Diagnosis not present

## 2023-07-09 DIAGNOSIS — H401222 Low-tension glaucoma, left eye, moderate stage: Secondary | ICD-10-CM | POA: Diagnosis not present

## 2023-07-17 ENCOUNTER — Encounter: Payer: Self-pay | Admitting: Internal Medicine

## 2023-07-17 ENCOUNTER — Ambulatory Visit (INDEPENDENT_AMBULATORY_CARE_PROVIDER_SITE_OTHER): Payer: Medicare Other | Admitting: Internal Medicine

## 2023-07-17 ENCOUNTER — Telehealth: Payer: Self-pay

## 2023-07-17 VITALS — BP 122/70 | HR 70 | Ht 66.0 in | Wt 181.0 lb

## 2023-07-17 DIAGNOSIS — E559 Vitamin D deficiency, unspecified: Secondary | ICD-10-CM | POA: Diagnosis not present

## 2023-07-17 DIAGNOSIS — Z7984 Long term (current) use of oral hypoglycemic drugs: Secondary | ICD-10-CM | POA: Diagnosis not present

## 2023-07-17 DIAGNOSIS — E785 Hyperlipidemia, unspecified: Secondary | ICD-10-CM

## 2023-07-17 DIAGNOSIS — M810 Age-related osteoporosis without current pathological fracture: Secondary | ICD-10-CM

## 2023-07-17 DIAGNOSIS — E1165 Type 2 diabetes mellitus with hyperglycemia: Secondary | ICD-10-CM | POA: Diagnosis not present

## 2023-07-17 NOTE — Patient Instructions (Addendum)
 Please continue: - Glipizide  XL 2.5 mg 2x daily before b'fast and before dinner  Please return in 4 months.

## 2023-07-17 NOTE — Progress Notes (Signed)
 Patient ID: Tammy Pineda, female   DOB: [birth year], 78 y.o.   MRN: [id number]   HPI: Tammy Pineda is an 78 y.o.-year-old female, initially referred by her PCP, Dr. Langston Pineda, returning for follow-up for DM2, dx ~2019, non-insulin -dependent, uncontrolled, with complications (CAD, CKD stage III, NASH) and clinical osteoporosis. Tammy Pineda, her husband, is also my pt. Last visit 6 months ago.  Interim history: She continues to have left knee pain after her TKR and also has right knee pain and weakness.  She also has back pain, for which she was getting steroid injections, but stopped as these did not work.  Now getting lidocaine  injections under fluoroscopy.   No blurry vision, nausea, chest pain.  She has leg swelling - on Lasix and potassium prn. She is seeing GI (Dr. Lavaughn Pineda) for IBS with diarrhea. 2 mo she had diverticulitis. Glu decreased to 100 as she was on a liquid diet.  DM2: Reviewed HbA1c levels: 06/19/2023: HbA1c 6.9% 02/27/2023: HbA1c 7.3% reportedly Lab Results  Component Value Date   HGBA1C 6.8 (A) 12/26/2022   HGBA1C 6.5 (A) 06/24/2022   HGBA1C 6.6 (A) 02/21/2022   HGBA1C 6.3 (A) 10/22/2021   HGBA1C 6.7 (A) 06/18/2021   HGBA1C 6.5 (A) 03/15/2021   HGBA1C 6.5 (A) 11/30/2020   HGBA1C 6.1 (A) 07/27/2020   HGBA1C 6.1 (A) 04/18/2020   HGBA1C 6.7 (A) 08/11/2019   HGBA1C 6.9 (A) 05/03/2019   HGBA1C 6.4 (A) 02/08/2019   HGBA1C 5.9 10/08/2018   HGBA1C 6.2 (A) 08/04/2018  11/25/2019: HbA1c 6.5% 02/26/2018: HbA1c 6.5% 09/20/2017: HbA1c 7.3% 04/18/2017: HbA1c 7.6%  Previously on: - Metformin  1000 mg 2x a day, with meals -added 11/2017 - had significant diarrhea >> metformin  ER 1000 mg 2x a day which she was tolerating well, however, she then started to have severe diarrhea >> she stopped in 09/2018. We tried Farxiga  in the past but this was too expensive.  Currently on: - Glipizide  XL 5 mg before breakfast and 2.5 mg before a larger dinner >> actually taking 2.5 mg 2x a day after  meals! >>  Moved before meals but takes it 3 times a day >> decrease to 2 times a day We tried Jardiance  in 03/2021 and this was too expensive.  Pt checks her sugars > 4x a day:  Previously:   Previously:   Lowest sugar was 50 >> 70 >> 59 >> 85; it is unclear at which level she has hypoglycemia awareness. Highest sugar was 269 - steroids>> 197 >> 396 (steroids) >> 215.  Glucometer: One Touch verio  Pt's meals are: - Breakfast: eggs, cereal + fruit, egg waffles and sugar-free syrup  - Lunch: salad, grilled chicken - Dinner: baked meat, veggies, starch  - Snacks: 1-2x - at bedtime - granola  In the past, she was exercising at [employer] and also dancing 2-3 times a week.  However, she stopped during the coronavirus pandemic.  [facility]  -+ CKD stage III, last BUN/creatinine:  Lab Results  Component Value Date   BUN 26 (H) 05/15/2023   BUN 19 03/25/2022   CREATININE 0.95 05/15/2023   CREATININE 0.96 03/25/2022   Lab Results  Component Value Date   MICRALBCREAT 1.3 12/26/2022   MICRALBCREAT 1.1 02/08/2019  On losartan  50.  -+ HL; last set of lipids available for review: 06/19/2023: 168/404/51/56 12/20/2022: 157/186/50/76 05/15/2022: 167/319/48/76 Lab Results  Component Value Date   CHOL 139 11/30/2020   HDL 42.70 11/30/2020   LDLCALC 70 12/22/2009  LDLDIRECT 59.0 11/30/2020   TRIG 278.0 (H) 11/30/2020   CHOLHDL 3 11/30/2020  11/25/2019: 151/268/43/65 On Crestor  5. I rec'd  fish oil 1000 mg daily after last visit >> tried >> indigestion.  - last eye exam was in 2024: + Glaucoma, no DR. Dr. Candi Pineda.  She has a history of cataract surgery.  - no numbness and tingling in her feet.  Foot exam performed 06/24/2022.  Pt has FH of DM in father and brother.   Osteoporosis (OP):  I reviewed pt's DXA scans: Date L1-L4 T score FN T score 33% distal Radius Ultra distal radius  06/01/2021 N/a RFN: -2.1 LFN: -2.4 -1.7 (-3.4%) -1.5  12/25/2017 N/a  RFN:  -0.9 LFN: -2.1 -1.5 -1.8  FRAX: MOF 24.2%, 10-year hip fracture risk 7.2%  She has the following fractures: Left wrist - in her 42s Skull - 01/2019 - tripped in the driveway  We started Reclast : 1st dose 07/04/2021 2nd dose 07/09/2022 She tolerated it well, without jaw/hip/thigh pain.  No dizziness/vertigo/orthostasis/poor vision.   No previous OP treatments.  +h/o vitamin D  insufficiency. Reviewed available vit D levels: 06/19/2023: vit D still low for daily as checked by PCP - dose increased to 4000 12/17/2022: vitamin D  28.7 05/2022: Vitamin D  was reportedly normal (I do not have these records) Lab Results  Component Value Date   VD25OH 26.22 (L) 06/18/2021  10/29/2017: Vitamin D  35  Pt is on: - vitamin D   - 2000 >> 5000 units daily>> then 2000 units >> increase the dose to 4000 units 06/2023  No weight bearing exercises. R shoulder rotator cuff.   She does not take high vitamin A doses.  Menopause was at 78 y/o - postsurgical - HRT - stopped at 43.   FH of osteoporosis: sister - hip fracture.  No h/o persistent hyper/hypocalcemia or hyperparathyroidism. No h/o kidney stones. Lab Results  Component Value Date   CALCIUM  9.7 05/15/2023   CALCIUM  9.1 03/25/2022   CALCIUM  8.9 03/22/2022   CALCIUM  8.7 (L) 03/21/2022   CALCIUM  9.0 03/11/2022   CALCIUM  9.6 06/18/2021   CALCIUM  8.5 (L) 01/04/2020   CALCIUM  9.6 12/22/2019   CALCIUM  9.1 05/16/2019   CALCIUM  9.8 02/08/2019   No h/o thyrotoxicosis. Reviewed TSH recent levels:  Lab Results  Component Value Date   TSH 2.55 10/19/2009   TSH 1.55 05/09/2006   She has a history of GERD. She also has a history of HTN-on Lasix as needed, IBS with diarrhea, GERD, vaginal prolapse - h/o surgery. She also has NASH with stage 2 fibrosis- in a study.  She was previously in another study that was using a drug which helped her significantly (HbA1c dropped to 6.2%), however, the study was stopped and the drug is not available.  She  sees Dr. Meredith Pineda for RA.  On Remicade . She had left TKR 01/03/2020 - had a lot of pain after the surgery.  She also had a DVT in the L leg - on Xarelto x 3 mo. She has repeated UTIs. She had shoulder surgery in 10/2021. She is seen in neurosurgery >> gets Lidocaine  injections.   ROS: + see HPI  I reviewed pt's medications, allergies, PMH, social hx, family hx, and changes were documented in the history of present illness. Otherwise, unchanged from my initial visit note.  Past Medical History:  Diagnosis Date   Anxiety    Arthritis    Chronic kidney disease    Coronary artery disease    nonobstructive by cardiac catheterization 2011 with  a 30% LAD lesion   Depression    Diabetes (HCC)    Type 2   GERD (gastroesophageal reflux disease)    Glaucoma    Hypercholesteremia    Hypertension    NASH (nonalcoholic steatohepatitis)    NASH   Pneumonia    30 years ago   PVC (premature ventricular contraction)    Seasonal allergies    Sleep apnea    Spinal stenosis    Past Surgical History:  Procedure Laterality Date   ANTERIOR AND POSTERIOR VAGINAL REPAIR     APPENDECTOMY  1959   BLADDER SUSPENSION     BUNIONECTOMY     CARDIAC CATHETERIZATION  2011   clean cath   CARDIOVASCULAR STRESS TEST  2022   CATARACT EXTRACTION Left 2015   CATARACT EXTRACTION W/ INTRAOCULAR LENS IMPLANT Right 2010   KNEE ARTHROSCOPY Left    NASAL RECONSTRUCTION  1976   ROBOTIC ASSISTED LAPAROSCOPIC SACROCOLPOPEXY     with mesh, urethral sling, posterior vaginal repair   SHOULDER ARTHROSCOPY Left    SHOULDER SURGERY Right 10/16/2021   repair   TOTAL ABDOMINAL HYSTERECTOMY  1976   TOTAL KNEE ARTHROPLASTY Left 01/03/2020   Procedure: TOTAL KNEE ARTHROPLASTY;  Surgeon: Liliane Rei, MD;  Location: [facility];  Service: Orthopedics;  Laterality: Left;    TRANSFORAMINAL LUMBAR INTERBODY FUSION W/ MIS 2 LEVEL Right 03/14/2022   Procedure: Minimally Invasive Surgery Decompression, Transforaminal Lumbar  Interbody Fusion Lumbar three-four, Lumbar four-five;  Surgeon: Dawley, Colby Daub, DO;  Location: [facility];  Service: Neurosurgery;  Laterality: Right;   Social History   Socioeconomic History   Marital status: Married    Spouse name: Not on file   Number of children: 1   Years of education: Not on file   Highest education level: Not on file  Occupational History   Occupation: Charity fundraiser - retired    Associate Professor: [employer]     Comment: [employer]  Tobacco Use   Smoking status: Never Smoker   Smokeless tobacco: Never Used  Substance and Sexual Activity   Alcohol use: No   Drug use: No   Current Outpatient Medications on File Prior to Visit  Medication Sig Dispense Refill   ALPRAZolam  (XANAX ) 0.5 MG tablet Take 0.5 tablets (0.25 mg total) by mouth at bedtime as needed for anxiety. 30 tablet 0   amoxicillin -clavulanate (AUGMENTIN ) 875-125 MG tablet Take 1 tablet by mouth every 12 (twelve) hours. 14 tablet 0   aspirin  EC (ASPIRIN  81) 81 MG tablet Take 1 tablet (81 mg total) by mouth daily. 30 tablet 0   bisacodyl  (DULCOLAX) 5 MG EC tablet Take 1 tablet (5 mg total) by mouth daily as needed for moderate constipation. 30 tablet 0   brimonidine  (ALPHAGAN ) 0.2 % ophthalmic solution Place 1 drop into both eyes 2 (two) times daily.     Cholecalciferol  125 MCG (5000 UT) TABS Take 1 tablet (5,000 Units total) by mouth daily. (Patient taking differently: Take 10,000 Units by mouth daily. Take 2 a day) 30 tablet 0   Continuous Glucose Receiver (FREESTYLE LIBRE 2 READER) DEVI 1 each by Does not apply route daily. Use GoodRx 1 each 0   Continuous Glucose Sensor (FREESTYLE LIBRE 2 SENSOR) MISC 1 each by Does not apply route every 14 (fourteen) days. Use GoodRx 6 each 3   Cyanocobalamin  (VITAMIN B-12) 5000 MCG TBDP Take 1,000 mcg by mouth See admin instructions. Takes on Monday and Thursday 30 tablet 0   diclofenac  Sodium (VOLTAREN ) 1 %  GEL Apply 2 g topically 4 (four) times daily. 100 g 0   dicyclomine  (BENTYL )  10 MG capsule Take 1 capsule (10 mg total) by mouth 3 (three) times daily as needed for spasms. 10 capsule 0   escitalopram  (LEXAPRO ) 10 MG tablet Take 1 tablet (10 mg total) by mouth at bedtime. 30 tablet 0   fluconazole  (DIFLUCAN ) 150 MG tablet Take 1 tablet (150 mg total) by mouth daily. Take second table three days after first if persistent yeast infection symptoms 2 tablet 0   glipiZIDE  (GLUCOTROL  XL) 2.5 MG 24 hr tablet TAKE 1 TABLET BY MOUTH 2 TIMES DAILY BEFORE MEALS 180 tablet 3   glucose blood (ONETOUCH VERIO) test strip Use as instructed to check blood sugar 1X daily 100 each 3   hydrochlorothiazide  (HYDRODIURIL ) 12.5 MG tablet Take 1 tablet (12.5 mg total) by mouth daily. 30 tablet 0   latanoprost  (XALATAN ) 0.005 % ophthalmic solution Place 1 drop into both eyes at bedtime.     losartan  (COZAAR ) 25 MG tablet Take 1 tablet (25 mg total) by mouth at bedtime. 30 tablet 0   meclizine  (ANTIVERT ) 12.5 MG tablet Take 1 tablet (12.5 mg total) by mouth 3 (three) times daily as needed for dizziness. 21 tablet 0   methocarbamol  (ROBAXIN ) 500 MG tablet Take 1 tablet (500 mg total) by mouth every 6 (six) hours as needed for muscle spasms. 60 tablet 0   metroNIDAZOLE  (FLAGYL ) 500 MG tablet Take 1 tablet (500 mg total) by mouth 2 (two) times daily. 14 tablet 0   omeprazole  (PRILOSEC) 20 MG capsule Take 1 capsule (20 mg total) by mouth daily as needed (acid reflux/indigestion). 30 capsule 0   Oxycodone  HCl 10 MG TABS Take 1 tablet (10 mg total) by mouth every 4 (four) hours as needed ((score 7 to 10)). 30 tablet 0   polyethylene glycol (MIRALAX  / GLYCOLAX ) 17 g packet Take 17 g by mouth daily. 14 each 0   rosuvastatin  (CRESTOR ) 5 MG tablet Take 1 tablet (5 mg total) by mouth at bedtime. 30 tablet 0   timolol  (TIMOPTIC ) 0.5 % ophthalmic solution Place 1 drop into both eyes daily.     Current Facility-Administered Medications on File Prior to Visit  Medication Dose Route Frequency Provider Last Rate Last  Admin   regadenoson  (LEXISCAN ) injection SOLN 0.4 mg  0.4 mg Intravenous Once Sheryle Donning, MD       technetium tetrofosmin  (TC-MYOVIEW ) injection 30.9 millicurie  30.9 millicurie Intravenous Once PRN Sheryle Donning, MD       Allergies  Allergen Reactions   Metformin  And Related Diarrhea   Gabapentin Palpitations   Hydrocodone  Itching   Plaquenil [Hydroxychloroquine] Nausea And Vomiting   Tylenol  [Acetaminophen ] Other (See Comments)    History of Liver Disease   Sulfamethoxazole-Trimethoprim Rash   Tape Rash   Family History  Problem Relation Age of Onset   Heart disease Father        open heart surgery at 31   Colon cancer Father    Heart disease Mother        open heart surgery at age 61   Hypertension Mother    Heart attack Maternal Grandfather    Stroke Paternal Grandmother    Heart block Brother    PE: BP 122/70   Pulse 70   Ht 5' 6 (1.676 m)   Wt 181 lb (82.1 kg)   SpO2 97%   BMI 29.21 kg/m   Wt Readings from Last 3 Encounters:  07/17/23  181 lb (82.1 kg)  05/15/23 182 lb (82.6 kg)  03/13/23 182 lb 12.8 oz (82.9 kg)   Constitutional: overweight, in NAD Eyes:  EOMI, no exophthalmos ENT: no neck masses, no cervical lymphadenopathy, + B pitting edema Cardiovascular: RRR, No MRG Respiratory: CTA B Musculoskeletal: no deformities Skin:no rashes Neurological: no tremor with outstretched hands Diabetic Foot Exam - Simple   Simple Foot Form Diabetic Foot exam was performed with the following findings: Yes 07/17/2023  1:43 PM  Visual Inspection No deformities, no ulcerations, no other skin breakdown bilaterally: Yes Sensation Testing Intact to touch and monofilament testing bilaterally: Yes Pulse Check Posterior Tibialis and Dorsalis pulse intact bilaterally: Yes Comments + B pitting edema L hallux onychodystrophy    ASSESSMENT: 1. DM2, non-insulin -dependent, uncontrolled, with long-term complications - CAD - CKD stage III - NASH  2.  HL  3. OP  4.  Vitamin D  deficiency  PLAN:  1. Patient with type 2 diabetes on sulfonylurea only, with the dose reduced at last visit.  At that time, she was taking glipizide  3 times a day, instead of 2 times a day so we reduced the dose back to bid as she was dropping her blood sugars in the afternoon.  We discussed about the possibility of using  an SGLT2 inhibitor or GLP 1 receptor agonist.  Her husband was obtaining Ozempic through the [place] insurance and she wanted to check with them. -Latest HbA1c was slightly worse, increased from 7.3% to 6.9% on 06/19/2023 CGM interpretation: -At today's visit, we reviewed her CGM downloads: It appears that 85% of values are in target range (goal >70%), while 15% are higher than 180 (goal <25%), and 0% are lower than 70 (goal <4%).  The calculated average blood sugar is 147.  The projected HbA1c for the next 3 months (GMI) is 6.8%. -Reviewing the CGM trends, sugars appear to be mainly well-controlled, with only slight increases after breakfast and occasionally, higher blood sugars after dinner.  For now, I would not suggest to change her regimen as sugars are mostly at goal for age.  She changed her insurance since last visit in hopes that her sensor would still be covered. -I suggested to: Patient Instructions  Please continue: - Glipizide  XL 2.5 mg 2x daily before b'fast and before dinner  Please return in 4 months.  - advised to check sugars at different times of the day - 4x a day, rotating check times - advised for yearly eye exams >> she is UTD - will need an ACR - return to clinic in 4 months  2. HL -Latest lipid panel was reviewed from 06/19/2023: 168/404/51/56-triglycerides much higher, LDL improved - She continues Crestor  5 mg daily without side effects. We tried fish oil 1000 mg daily but developed indigestion and stopped.   3. OP -Likely menopausal/age-related and she also has a family history of osteoporosis -Her bone density scores from  2023 showed a high risk for fracture reflected in the FRAX score. -Discussed about fall precautions, weightbearing exercises, optimizing vitamin D  levels and we started Reclast  -she had 1 dose in 06/2021 and another dose in 06/2022.  She tolerates them well. - She is due for another dose of Reclast  now - ordered today - Will check the bone density next year  4.  Vitamin D  insufficiency - Latest vitamin D  level was slightly low, 28.7. - I advised her to increase her vitamin D  supplement from 2000 to 4000 units daily, but did not do so - latest vit  D was low last month again reportedly >> vit D improved to 4000 units daily last mo - Will recheck the level  at next OV  Emilie Harden, MD PhD [facility]

## 2023-07-17 NOTE — Telephone Encounter (Signed)
 Dr. Aldona Amel, patient will be scheduled as soon as possible.  Auth Submission: NO AUTH NEEDED Site of care: Site of care: [facility] Payer: Medicare A/B with [employer] Medication & CPT/J Code(s) submitted: Reclast  (Zolendronic acid) F6213 Route of submission (phone, fax, portal):  Phone # Fax # Auth type: Buy/Bill PB Units/visits requested: 5mg  x 1 dose Reference number:  Approval from: 07/17/23 to 02/04/24

## 2023-07-18 ENCOUNTER — Ambulatory Visit: Payer: Self-pay | Admitting: Internal Medicine

## 2023-07-18 LAB — MICROALBUMIN / CREATININE URINE RATIO
Creatinine, Urine: 51 mg/dL (ref 20–275)
Microalb Creat Ratio: 4 mg/g{creat} (ref <30)
Microalb, Ur: 0.2 mg/dL

## 2023-07-23 ENCOUNTER — Encounter (HOSPITAL_BASED_OUTPATIENT_CLINIC_OR_DEPARTMENT_OTHER): Payer: Self-pay

## 2023-07-30 ENCOUNTER — Ambulatory Visit

## 2023-08-01 DIAGNOSIS — E11649 Type 2 diabetes mellitus with hypoglycemia without coma: Secondary | ICD-10-CM | POA: Diagnosis not present

## 2023-08-01 DIAGNOSIS — N183 Chronic kidney disease, stage 3 unspecified: Secondary | ICD-10-CM | POA: Diagnosis not present

## 2023-08-01 DIAGNOSIS — I700 Atherosclerosis of aorta: Secondary | ICD-10-CM | POA: Diagnosis not present

## 2023-08-01 DIAGNOSIS — I10 Essential (primary) hypertension: Secondary | ICD-10-CM | POA: Diagnosis not present

## 2023-08-06 ENCOUNTER — Ambulatory Visit: Admitting: *Deleted

## 2023-08-06 VITALS — BP 137/65 | HR 59 | Temp 98.0°F | Resp 16 | Ht 66.0 in | Wt 179.2 lb

## 2023-08-06 DIAGNOSIS — M810 Age-related osteoporosis without current pathological fracture: Secondary | ICD-10-CM | POA: Diagnosis not present

## 2023-08-06 MED ORDER — ACETAMINOPHEN 325 MG PO TABS: 650.0000 mg | ORAL_TABLET | Freq: Once | ORAL | Status: DC

## 2023-08-06 MED ORDER — ZOLEDRONIC ACID 5 MG/100ML IV SOLN
5.0000 mg | Freq: Once | INTRAVENOUS | Status: AC
  Administered 2023-08-06: 5 mg via INTRAVENOUS
  Filled 2023-08-06: qty 100

## 2023-08-06 MED ORDER — DIPHENHYDRAMINE HCL 25 MG PO CAPS: 25.0000 mg | ORAL_CAPSULE | Freq: Once | ORAL | Status: DC

## 2023-08-06 NOTE — Progress Notes (Signed)
 Diagnosis: Rheumatoid Arthritis  Provider:  Lonna Coder MD  Procedure: IV Infusion  IV Type: Peripheral, IV Location: L Forearm  Reclast  (Zolendronic Acid), Dose: 5 mg  Infusion Start Time: 1451  Infusion Stop Time: 1530  Post Infusion IV Care: Observation period completed  Discharge: Condition: Good, Destination: Home . AVS Declined  Performed by:  Mathew Therisa NOVAK, RN

## 2023-08-11 ENCOUNTER — Other Ambulatory Visit: Payer: Self-pay

## 2023-08-11 MED ORDER — FREESTYLE LIBRE 2 SENSOR MISC: 1.0000 | 3 refills | Status: DC

## 2023-08-11 NOTE — Telephone Encounter (Signed)
 Requested Prescriptions   Signed Prescriptions Disp Refills   Continuous Glucose Sensor (FREESTYLE LIBRE 2 SENSOR) MISC 6 each 3    Sig: 1 each by Does not apply route every 14 (fourteen) days. Use GoodRx    Authorizing Provider: TRIXIE FILE    Ordering User: ARLOA, Nicholus Chandran N

## 2023-08-15 ENCOUNTER — Other Ambulatory Visit: Payer: Self-pay

## 2023-08-15 MED ORDER — FREESTYLE LIBRE 2 PLUS SENSOR MISC: 1.0000 | 3 refills | Status: AC

## 2023-08-19 DIAGNOSIS — M0609 Rheumatoid arthritis without rheumatoid factor, multiple sites: Secondary | ICD-10-CM | POA: Diagnosis not present

## 2023-08-31 DIAGNOSIS — I700 Atherosclerosis of aorta: Secondary | ICD-10-CM | POA: Diagnosis not present

## 2023-08-31 DIAGNOSIS — E11649 Type 2 diabetes mellitus with hypoglycemia without coma: Secondary | ICD-10-CM | POA: Diagnosis not present

## 2023-08-31 DIAGNOSIS — I10 Essential (primary) hypertension: Secondary | ICD-10-CM | POA: Diagnosis not present

## 2023-08-31 DIAGNOSIS — N183 Chronic kidney disease, stage 3 unspecified: Secondary | ICD-10-CM | POA: Diagnosis not present

## 2023-09-02 DIAGNOSIS — Z6830 Body mass index (BMI) 30.0-30.9, adult: Secondary | ICD-10-CM | POA: Diagnosis not present

## 2023-09-02 DIAGNOSIS — M48061 Spinal stenosis, lumbar region without neurogenic claudication: Secondary | ICD-10-CM | POA: Diagnosis not present

## 2023-09-02 DIAGNOSIS — Z79899 Other long term (current) drug therapy: Secondary | ICD-10-CM | POA: Diagnosis not present

## 2023-09-02 DIAGNOSIS — E669 Obesity, unspecified: Secondary | ICD-10-CM | POA: Diagnosis not present

## 2023-09-02 DIAGNOSIS — M0609 Rheumatoid arthritis without rheumatoid factor, multiple sites: Secondary | ICD-10-CM | POA: Diagnosis not present

## 2023-09-02 DIAGNOSIS — M1991 Primary osteoarthritis, unspecified site: Secondary | ICD-10-CM | POA: Diagnosis not present

## 2023-09-03 DIAGNOSIS — M47816 Spondylosis without myelopathy or radiculopathy, lumbar region: Secondary | ICD-10-CM | POA: Diagnosis not present

## 2023-09-03 DIAGNOSIS — Z6828 Body mass index (BMI) 28.0-28.9, adult: Secondary | ICD-10-CM | POA: Diagnosis not present

## 2023-09-03 DIAGNOSIS — M5416 Radiculopathy, lumbar region: Secondary | ICD-10-CM | POA: Diagnosis not present

## 2023-09-04 ENCOUNTER — Other Ambulatory Visit: Payer: Self-pay | Admitting: Internal Medicine

## 2023-09-04 DIAGNOSIS — I10 Essential (primary) hypertension: Secondary | ICD-10-CM | POA: Diagnosis not present

## 2023-09-04 DIAGNOSIS — I700 Atherosclerosis of aorta: Secondary | ICD-10-CM | POA: Diagnosis not present

## 2023-09-04 DIAGNOSIS — E11649 Type 2 diabetes mellitus with hypoglycemia without coma: Secondary | ICD-10-CM | POA: Diagnosis not present

## 2023-09-04 DIAGNOSIS — E1165 Type 2 diabetes mellitus with hyperglycemia: Secondary | ICD-10-CM

## 2023-09-04 DIAGNOSIS — N183 Chronic kidney disease, stage 3 unspecified: Secondary | ICD-10-CM | POA: Diagnosis not present

## 2023-09-04 DIAGNOSIS — E7800 Pure hypercholesterolemia, unspecified: Secondary | ICD-10-CM | POA: Diagnosis not present

## 2023-09-05 ENCOUNTER — Telehealth: Payer: Self-pay

## 2023-09-05 ENCOUNTER — Encounter: Payer: Self-pay | Admitting: Internal Medicine

## 2023-09-05 ENCOUNTER — Other Ambulatory Visit: Payer: Self-pay | Admitting: Internal Medicine

## 2023-09-05 ENCOUNTER — Other Ambulatory Visit (HOSPITAL_COMMUNITY): Payer: Self-pay

## 2023-09-05 DIAGNOSIS — E1165 Type 2 diabetes mellitus with hyperglycemia: Secondary | ICD-10-CM

## 2023-09-05 NOTE — Telephone Encounter (Signed)
 Pt needs PA for One Touch Test Strips

## 2023-09-05 NOTE — Telephone Encounter (Signed)
 Pharmacy Patient Advocate Encounter   Received notification from Pt Calls Messages that prior authorization for OneTouch Verio Test Strips is required/requested.   Insurance verification completed.   The patient is insured through [employer] .   Per test claim: Medication is not eligible for pharmacy benefits and must be billed through medical insurance. As our team only handles pharmacy related prior auths, medical PA's must be submitted by the clinic. Thank you  PREFER ACCU-CHEK OR TRUE METRIX

## 2023-09-12 ENCOUNTER — Ambulatory Visit: Payer: Medicare Other | Admitting: Internal Medicine

## 2023-09-22 DIAGNOSIS — M129 Arthropathy, unspecified: Secondary | ICD-10-CM | POA: Diagnosis not present

## 2023-09-22 DIAGNOSIS — M545 Low back pain, unspecified: Secondary | ICD-10-CM | POA: Diagnosis not present

## 2023-09-22 DIAGNOSIS — R9431 Abnormal electrocardiogram [ECG] [EKG]: Secondary | ICD-10-CM | POA: Diagnosis not present

## 2023-09-22 DIAGNOSIS — E559 Vitamin D deficiency, unspecified: Secondary | ICD-10-CM | POA: Diagnosis not present

## 2023-09-22 DIAGNOSIS — R0789 Other chest pain: Secondary | ICD-10-CM | POA: Diagnosis not present

## 2023-09-22 DIAGNOSIS — E119 Type 2 diabetes mellitus without complications: Secondary | ICD-10-CM | POA: Diagnosis not present

## 2023-09-22 DIAGNOSIS — M546 Pain in thoracic spine: Secondary | ICD-10-CM | POA: Diagnosis not present

## 2023-09-22 DIAGNOSIS — R5383 Other fatigue: Secondary | ICD-10-CM | POA: Diagnosis not present

## 2023-09-22 DIAGNOSIS — G4730 Sleep apnea, unspecified: Secondary | ICD-10-CM | POA: Diagnosis not present

## 2023-09-23 ENCOUNTER — Encounter: Payer: Self-pay | Admitting: Internal Medicine

## 2023-09-23 DIAGNOSIS — M5124 Other intervertebral disc displacement, thoracic region: Secondary | ICD-10-CM | POA: Diagnosis not present

## 2023-09-23 DIAGNOSIS — M48061 Spinal stenosis, lumbar region without neurogenic claudication: Secondary | ICD-10-CM | POA: Diagnosis not present

## 2023-09-23 DIAGNOSIS — M5416 Radiculopathy, lumbar region: Secondary | ICD-10-CM | POA: Diagnosis not present

## 2023-09-23 DIAGNOSIS — M4317 Spondylolisthesis, lumbosacral region: Secondary | ICD-10-CM | POA: Diagnosis not present

## 2023-09-24 DIAGNOSIS — G894 Chronic pain syndrome: Secondary | ICD-10-CM | POA: Diagnosis not present

## 2023-09-29 MED ORDER — ACCU-CHEK GUIDE TEST VI STRP: ORAL_STRIP | 12 refills | Status: AC

## 2023-09-29 MED ORDER — ACCU-CHEK GUIDE W/DEVICE KIT
PACK | 0 refills | Status: AC
Start: 2023-09-29 — End: ?

## 2023-09-29 MED ORDER — ACCU-CHEK SOFTCLIX LANCETS MISC: 12 refills | Status: AC

## 2023-09-29 NOTE — Telephone Encounter (Signed)
 New meter has been sent.   Requested Prescriptions   Signed Prescriptions Disp Refills   glucose blood (ACCU-CHEK GUIDE TEST) test strip 100 each 12    Sig: Use to check blood sugar once a day DxCode:E11.65    Authorizing Provider: TRIXIE FILE    Ordering User: Dwayna Kentner S   Blood Glucose Monitoring Suppl (ACCU-CHEK GUIDE) w/Device KIT 1 kit 0    Sig: Use to check blood sugar once a day DxCode:E11.65    Authorizing Provider: TRIXIE FILE    Ordering User: Teresia Myint S   Accu-Chek Softclix Lancets lancets 100 each 12    Sig: Use to check blood sugar once a day DxCode:E11.65    Authorizing Provider: TRIXIE FILE    Ordering User: CLEOTILDE ROLIN RAMAN

## 2023-09-29 NOTE — Addendum Note (Signed)
 Addended by: CLEOTILDE ROLIN RAMAN on: 09/29/2023 11:34 AM   Modules accepted: Orders

## 2023-09-30 ENCOUNTER — Encounter: Payer: Self-pay | Admitting: Neurology

## 2023-09-30 ENCOUNTER — Ambulatory Visit (INDEPENDENT_AMBULATORY_CARE_PROVIDER_SITE_OTHER): Admitting: Neurology

## 2023-09-30 VITALS — BP 114/68 | HR 79

## 2023-09-30 DIAGNOSIS — I10 Essential (primary) hypertension: Secondary | ICD-10-CM | POA: Diagnosis not present

## 2023-09-30 DIAGNOSIS — N183 Chronic kidney disease, stage 3 unspecified: Secondary | ICD-10-CM | POA: Diagnosis not present

## 2023-09-30 DIAGNOSIS — E11649 Type 2 diabetes mellitus with hypoglycemia without coma: Secondary | ICD-10-CM | POA: Diagnosis not present

## 2023-09-30 DIAGNOSIS — M129 Arthropathy, unspecified: Secondary | ICD-10-CM | POA: Diagnosis not present

## 2023-09-30 DIAGNOSIS — R0789 Other chest pain: Secondary | ICD-10-CM | POA: Diagnosis not present

## 2023-09-30 DIAGNOSIS — G3184 Mild cognitive impairment, so stated: Secondary | ICD-10-CM | POA: Diagnosis not present

## 2023-09-30 DIAGNOSIS — E119 Type 2 diabetes mellitus without complications: Secondary | ICD-10-CM | POA: Diagnosis not present

## 2023-09-30 DIAGNOSIS — G4730 Sleep apnea, unspecified: Secondary | ICD-10-CM | POA: Diagnosis not present

## 2023-09-30 DIAGNOSIS — R7401 Elevation of levels of liver transaminase levels: Secondary | ICD-10-CM | POA: Diagnosis not present

## 2023-09-30 DIAGNOSIS — E559 Vitamin D deficiency, unspecified: Secondary | ICD-10-CM | POA: Diagnosis not present

## 2023-09-30 DIAGNOSIS — M545 Low back pain, unspecified: Secondary | ICD-10-CM | POA: Diagnosis not present

## 2023-09-30 DIAGNOSIS — N1832 Chronic kidney disease, stage 3b: Secondary | ICD-10-CM | POA: Diagnosis not present

## 2023-09-30 DIAGNOSIS — M546 Pain in thoracic spine: Secondary | ICD-10-CM | POA: Diagnosis not present

## 2023-09-30 DIAGNOSIS — I700 Atherosclerosis of aorta: Secondary | ICD-10-CM | POA: Diagnosis not present

## 2023-09-30 DIAGNOSIS — R9431 Abnormal electrocardiogram [ECG] [EKG]: Secondary | ICD-10-CM | POA: Diagnosis not present

## 2023-09-30 NOTE — Patient Instructions (Signed)
 Continue current medications  Will check TSH and B12 level at PCP office today  Continue to follow up with PCP and return if symptoms do get worse.   There are well-accepted and sensible ways to reduce risk for Alzheimers disease and other degenerative brain disorders .  Exercise Daily Walk A daily 20 minute walk should be part of your routine. Disease related apathy can be a significant roadblock to exercise and the only way to overcome this is to make it a daily routine and perhaps have a reward at the end (something your loved one loves to eat or drink perhaps) or a personal trainer coming to the home can also be very useful. Most importantly, the patient is much more likely to exercise if the caregiver / spouse does it with him/her. In general a structured, repetitive schedule is best.  General Health: Any diseases which effect your body will effect your brain such as a pneumonia, urinary infection, blood clot, heart attack or stroke. Keep contact with your primary care doctor for regular follow ups.  Sleep. A good nights sleep is healthy for the brain. Seven hours is recommended. If you have insomnia or poor sleep habits we can give you some instructions. If you have sleep apnea wear your mask.  Diet: Eating a heart healthy diet is also a good idea; fish and poultry instead of red meat, nuts (mostly non-peanuts), vegetables, fruits, olive oil or canola oil (instead of butter), minimal salt (use other spices to flavor foods), whole grain rice, bread, cereal and pasta and wine in moderation.Research is now showing that the MIND diet, which is a combination of The Mediterranean diet and the DASH diet, is beneficial for cognitive processing and longevity. Information about this diet can be found in The MIND Diet, a book by Annitta Feeling, MS, RDN, and online at [employer]  Finances, Power of [address] and Advance Directives: You should consider putting legal safeguards  in place with regard to financial and medical decision making. While the spouse always has power of attorney for medical and financial issues in the absence of any form, you should consider what you want in case the spouse / caregiver is no longer around or capable of making decisions.

## 2023-09-30 NOTE — Progress Notes (Signed)
 [facility]  PATIENT: Tammy Pineda DOB: 04-25-1945  REQUESTING CLINICIAN: Dayna Motto, DO HISTORY FROM: Patient  REASON FOR VISIT: Memory loss    HISTORICAL  CHIEF COMPLAINT:  Chief Complaint  Patient presents with   Follow-up   New Patient (Initial Visit)    Rm 13, memory concerns, alone    HISTORY OF PRESENT ILLNESS:  Discussed the use of AI scribe software for clinical note transcription with the patient, who gave verbal consent to proceed.  Tammy Pineda is an 78 year old female with hypertension and hyperlipidemia, depression, diabetes who presents with memory concerns and word-finding difficulties.  She experiences memory concerns, particularly while driving, often forgetting her destination and relying on GPS for directions. She misses turns and gets lost, needing GPS assistance to return home. Her son believes she should not be driving due to these issues. No recent falls, but she feels she would fall without assistance.  She has word-finding difficulties, struggling with remembering names and words. However, she does not have trouble remembering recent conversations or appointments, as she uses her phone to keep track of them. Her husband has noticed these memory issues.  An MRI was performed, showing chronic small vessel disease but no acute findings. She has a history of back surgery in February of the previous year, resulting in a weaker right foot, but no recent falls. She is undergoing physical therapy for residual pain and weakness.  Her past medical history includes hypertension, hyperlipidemia, and sleep apnea, for which she uses a CPAP machine. She takes Lexapro  for anxiety, which she started after experiencing symptoms she thought were a heart attack. No history of traumatic brain injury, stroke, or seizures.  Her family history is significant for dementia, with her mother having had vascular dementia following heart surgery, her  father developing dementia in his mid-20s, and her younger sister experiencing severe memory issues before her death last year.  She lives in a multi-generational household with her husband, son, daughter-in-law, grandchildren, and in-laws, managing her own and her husband's care on the ground floor. She reports good sleep with the use of her CPAP and manages her bills without difficulty. She engages in activities to keep her mind active, such as playing games, although she struggles with math. She is currently taking Crestor  for cholesterol management and has had her vitamin D3 dosage increased from 2000 to 4000 IU.        TBI:   No past history of TBI Stroke:   no past history of stroke Seizures:   no past history of seizures Sleep: yes, uses a CPAP Mood: Yes, on Lexapro   Family history of Dementia: Both parents and younger sister  Functional status: independent in all ADLs and IADLs Patient lives with husband and children and grandchildren in a multi generation home. Cooking: no issues Cleaning: no issues Shopping: no issues Bathing: no issues  Toileting: no issues  Driving: yes, has to rely on GPS even in familiar places  Bills: no issues Medications: no issues Ever left the stove on by accident?: denies Forget how to use items around the house?: no Getting lost going to familiar places?: yes Forgetting loved ones names?: no Word finding difficulty? Yes  Sleep: Good with CPAP    OTHER MEDICAL CONDITIONS: Hypertension, Hyperlipidemia, Diabetes, Anxiety   REVIEW OF SYSTEMS: Full 14 system review of systems performed and negative with exception of: As noted in the HPI  ALLERGIES: Allergies  Allergen Reactions   Metformin  And Related  Diarrhea   Gabapentin Palpitations   Hydrocodone  Itching   Plaquenil [Hydroxychloroquine] Nausea And Vomiting   Tylenol  Hermilo.Hammers ] Other (See Comments)    History of Liver Disease   Sulfamethoxazole-Trimethoprim Rash   Tape Rash     HOME MEDICATIONS: Outpatient Medications Prior to Visit  Medication Sig Dispense Refill   Accu-Chek Softclix Lancets lancets Use to check blood sugar once a day DxCode:E11.65 100 each 12   ALPRAZolam  (XANAX ) 0.5 MG tablet Take 0.5 tablets (0.25 mg total) by mouth at bedtime as needed for anxiety. 30 tablet 0   aspirin  EC (ASPIRIN  81) 81 MG tablet Take 1 tablet (81 mg total) by mouth daily. 30 tablet 0   bisacodyl  (DULCOLAX) 5 MG EC tablet Take 1 tablet (5 mg total) by mouth daily as needed for moderate constipation. 30 tablet 0   Blood Glucose Monitoring Suppl (ACCU-CHEK GUIDE) w/Device KIT Use to check blood sugar once a day DxCode:E11.65 1 kit 0   brimonidine  (ALPHAGAN ) 0.2 % ophthalmic solution Place 1 drop into both eyes 2 (two) times daily.     Cholecalciferol  125 MCG (5000 UT) TABS Take 1 tablet (5,000 Units total) by mouth daily. (Patient taking differently: Take 10,000 Units by mouth daily. Take 2 a day) 30 tablet 0   Continuous Glucose Receiver (FREESTYLE LIBRE 2 READER) DEVI 1 each by Does not apply route daily. Use GoodRx 1 each 0   Continuous Glucose Sensor (FREESTYLE LIBRE 2 PLUS SENSOR) MISC Inject 1 each into the skin as directed. Change sensor every 15 days 6 each 3   Cyanocobalamin  (VITAMIN B-12) 5000 MCG TBDP Take 1,000 mcg by mouth See admin instructions. Takes on Monday and Thursday 30 tablet 0   diclofenac  Sodium (VOLTAREN ) 1 % GEL Apply 2 g topically 4 (four) times daily. 100 g 0   dicyclomine  (BENTYL ) 10 MG capsule Take 1 capsule (10 mg total) by mouth 3 (three) times daily as needed for spasms. 10 capsule 0   escitalopram  (LEXAPRO ) 10 MG tablet Take 1 tablet (10 mg total) by mouth at bedtime. 30 tablet 0   fluconazole  (DIFLUCAN ) 150 MG tablet Take 1 tablet (150 mg total) by mouth daily. Take second table three days after first if persistent yeast infection symptoms 2 tablet 0   glipiZIDE  (GLUCOTROL  XL) 2.5 MG 24 hr tablet TAKE 1 TABLET BY MOUTH 2 TIMES DAILY BEFORE  MEALS 180 tablet 3   glucose blood (ACCU-CHEK GUIDE TEST) test strip Use to check blood sugar once a day DxCode:E11.65 100 each 12   hydrochlorothiazide  (HYDRODIURIL ) 12.5 MG tablet Take 1 tablet (12.5 mg total) by mouth daily. 30 tablet 0   HYDROcodone -Acetaminophen  5-300 MG TABS Take 0.5 mg by mouth.     latanoprost  (XALATAN ) 0.005 % ophthalmic solution Place 1 drop into both eyes at bedtime.     losartan  (COZAAR ) 25 MG tablet Take 1 tablet (25 mg total) by mouth at bedtime. 30 tablet 0   meclizine  (ANTIVERT ) 12.5 MG tablet Take 1 tablet (12.5 mg total) by mouth 3 (three) times daily as needed for dizziness. 21 tablet 0   methocarbamol  (ROBAXIN ) 500 MG tablet Take 1 tablet (500 mg total) by mouth every 6 (six) hours as needed for muscle spasms. 60 tablet 0   omeprazole  (PRILOSEC) 20 MG capsule Take 1 capsule (20 mg total) by mouth daily as needed (acid reflux/indigestion). 30 capsule 0   polyethylene glycol (MIRALAX  / GLYCOLAX ) 17 g packet Take 17 g by mouth daily. 14 each 0   rosuvastatin  (  CRESTOR ) 5 MG tablet Take 1 tablet (5 mg total) by mouth at bedtime. 30 tablet 0   timolol  (TIMOPTIC ) 0.5 % ophthalmic solution Place 1 drop into both eyes daily.     amoxicillin -clavulanate (AUGMENTIN ) 875-125 MG tablet Take 1 tablet by mouth every 12 (twelve) hours. (Patient not taking: Reported on 09/30/2023) 14 tablet 0   metroNIDAZOLE  (FLAGYL ) 500 MG tablet Take 1 tablet (500 mg total) by mouth 2 (two) times daily. (Patient not taking: Reported on 09/30/2023) 14 tablet 0   Facility-Administered Medications Prior to Visit  Medication Dose Route Frequency Provider Last Rate Last Admin   regadenoson  (LEXISCAN ) injection SOLN 0.4 mg  0.4 mg Intravenous Once Lonni Slain, MD       technetium tetrofosmin  (TC-MYOVIEW ) injection 30.9 millicurie  30.9 millicurie Intravenous Once PRN Lonni Slain, MD        PAST MEDICAL HISTORY: Past Medical History:  Diagnosis Date   Anxiety    Arthritis     Chronic kidney disease    Coronary artery disease    nonobstructive by cardiac catheterization 2011 with a 30% LAD lesion   Depression    Diabetes (HCC)    Type 2   GERD (gastroesophageal reflux disease)    Glaucoma    Hypercholesteremia    Hypertension    NASH (nonalcoholic steatohepatitis)    NASH   Pneumonia    30 years ago   PVC (premature ventricular contraction)    Seasonal allergies    Sleep apnea    Spinal stenosis     PAST SURGICAL HISTORY: Past Surgical History:  Procedure Laterality Date   ANTERIOR AND POSTERIOR VAGINAL REPAIR     APPENDECTOMY  1959   BLADDER SUSPENSION     BUNIONECTOMY     CARDIAC CATHETERIZATION  2011   clean cath   CARDIOVASCULAR STRESS TEST  2022   CATARACT EXTRACTION Left 2015   CATARACT EXTRACTION W/ INTRAOCULAR LENS IMPLANT Right 2010   KNEE ARTHROSCOPY Left    NASAL RECONSTRUCTION  1976   ROBOTIC ASSISTED LAPAROSCOPIC SACROCOLPOPEXY     with mesh, urethral sling, posterior vaginal repair   SHOULDER ARTHROSCOPY Left    SHOULDER SURGERY Right 10/16/2021   repair   TOTAL ABDOMINAL HYSTERECTOMY  1976   TOTAL KNEE ARTHROPLASTY Left 01/03/2020   Procedure: TOTAL KNEE ARTHROPLASTY;  Surgeon: Melodi Lerner, MD;  Location: [facility];  Service: Orthopedics;  Laterality: Left;    TRANSFORAMINAL LUMBAR INTERBODY FUSION W/ MIS 2 LEVEL Right 03/14/2022   Procedure: Minimally Invasive Surgery Decompression, Transforaminal Lumbar Interbody Fusion Lumbar three-four, Lumbar four-five;  Surgeon: Dawley, Lani BROCKS, DO;  Location: [facility];  Service: Neurosurgery;  Laterality: Right;    FAMILY HISTORY: Family History  Problem Relation Age of Onset   Heart disease Father        open heart surgery at 13   Colon cancer Father    Heart disease Mother        open heart surgery at age 37   Hypertension Mother    Heart attack Maternal Grandfather    Stroke Paternal Grandmother    Heart block Brother     SOCIAL HISTORY: Social History    Socioeconomic History   Marital status: Married    Spouse name: Not on file   Number of children: 1   Years of education: Not on file   Highest education level: Not on file  Occupational History   Occupation: Teacher, adult education: [employer]  Comment: [employer]  Tobacco Use   Smoking status: Never   Smokeless tobacco: Never  Vaping Use   Vaping status: Never Used  Substance and Sexual Activity   Alcohol use: No   Drug use: No   Sexual activity: Not on file    Comment: Hysterectomy  Other Topics Concern   Not on file  Social History Narrative   Right handed   Caffeine- 2 cups daily   Retired- OR Engineer, civil (consulting) previously   Social Drivers of Corporate investment banker Strain: Not on file  Food Insecurity: No Food Insecurity (03/15/2022)   Hunger Vital Sign    Worried About Running Out of Food in the Last Year: Never true    Ran Out of Food in the Last Year: Never true  Transportation Needs: No Transportation Needs (03/15/2022)   PRAPARE - Administrator, Civil Service (Medical): No    Lack of Transportation (Non-Medical): No  Physical Activity: Not on file  Stress: Not on file  Social Connections: Not on file  Intimate Partner Violence: Not on file    PHYSICAL EXAM  GENERAL EXAM/CONSTITUTIONAL: Vitals:  Vitals:   09/30/23 1038  BP: 114/68  Pulse: 79  SpO2: 98%   There is no height or weight on file to calculate BMI. Wt Readings from Last 3 Encounters:  08/06/23 179 lb 3.2 oz (81.3 kg)  07/17/23 181 lb (82.1 kg)  05/15/23 182 lb (82.6 kg)   Patient is in no distress; well developed, nourished and groomed; neck is supple  MUSCULOSKELETAL: Gait, strength, tone, movements noted in Neurologic exam below  NEUROLOGIC: MENTAL STATUS:      No data to display            09/30/2023   10:43 AM  Montreal Cognitive Assessment   Visuospatial/ Executive (0/5) 2  Naming (0/3) 2  Attention: Read list of digits (0/2) 1  Attention: Read list of letters  (0/1) 0  Attention: Serial 7 subtraction starting at 100 (0/3) 2  Language: Repeat phrase (0/2) 1  Language : Fluency (0/1) 1  Abstraction (0/2) 2  Delayed Recall (0/5) 3  Orientation (0/6) 6  Total 20    awake, alert, oriented to person, place and time language fluent, comprehension intact, naming intact fund of knowledge appropriate  CRANIAL NERVE:  2nd, 3rd, 4th, 6th- visual fields full to confrontation, extraocular muscles intact, no nystagmus 5th - facial sensation symmetric 7th - facial strength symmetric 8th - hearing intact 9th - palate elevates symmetrically, uvula midline 11th - shoulder shrug symmetric 12th - tongue protrusion midline  MOTOR:  normal bulk and tone, full strength in the BUE, BLE  SENSORY:  normal and symmetric to light touch  COORDINATION:  finger-nose-finger, fine finger movements normal  GAIT/STATION:  normal   DIAGNOSTIC DATA (LABS, IMAGING, TESTING) - I reviewed patient records, labs, notes, testing and imaging myself where available.  Lab Results  Component Value Date   WBC 12.5 (H) 05/15/2023   HGB 12.7 05/15/2023   HCT 39.4 05/15/2023   MCV 91.2 05/15/2023   PLT 214 05/15/2023      Component Value Date/Time   NA 135 05/15/2023 1146   K 3.5 05/15/2023 1146   CL 101 05/15/2023 1146   CO2 26 05/15/2023 1146   GLUCOSE 115 (H) 05/15/2023 1146   BUN 26 (H) 05/15/2023 1146   CREATININE 0.95 05/15/2023 1146   CREATININE 1.06 (H) 02/08/2019 1349   CALCIUM  9.7 05/15/2023 1146   PROT 8.0 05/15/2023  1146   ALBUMIN 4.0 05/15/2023 1146   AST 33 05/15/2023 1146   ALT 53 (H) 05/15/2023 1146   ALKPHOS 210 (H) 05/15/2023 1146   BILITOT 0.7 05/15/2023 1146   GFRNONAA >60 05/15/2023 1146   GFRNONAA 51 (L) 02/08/2019 1349   GFRAA >60 05/16/2019 1210   GFRAA 59 (L) 02/08/2019 1349   Lab Results  Component Value Date   CHOL 139 11/30/2020   HDL 42.70 11/30/2020   LDLCALC 70 12/22/2009   LDLDIRECT 59.0 11/30/2020   TRIG 278.0 (H)  11/30/2020   CHOLHDL 3 11/30/2020   Lab Results  Component Value Date   HGBA1C 6.8 (A) 12/26/2022   No results found for: [id number] Lab Results  Component Value Date   TSH 2.55 10/19/2009    MRI Brain 07/31/2023 1. No acute intracranial abnormality. No specific findings to explain the patient's symptoms. 2. Chronic small-vessel ischemic changes.    ASSESSMENT AND PLAN  78 y.o. year old female with hypertension, hyperlipidemia, diabetes, anxiety/depression who is presenting for management of memory loss.   Mild cognitive impairment with chronic microvascular ischemic brain changes Mild cognitive impairment with chronic microvascular ischemic brain changes on MRI. No clinical diagnosis of dementia as activities of daily living are intact. Family history of dementia. Current cognitive function is stable. Wondering if anxiety made her score poorly on the [facility] as she is managing very well at 78. - Discussed types of dementia and role of chronic microvascular ischemic changes. - Will defer Alzheimer's biomarkers testing due to potential anxiety as patient is functioning well at the moment - Emphasized importance of maintaining good health, sleep, and exercise for cognitive function. - Check thyroid  and B12 levels. - Maintain good sleep hygiene. - Engage in regular exercise. - Monitor cognitive function and report any worsening symptoms.  Hypertension Hypertension with improved blood pressure control. Hypertension contributes to chronic microvascular ischemic changes on MRI. - Emphasized importance of managing blood pressure to prevent further vascular damage.  Hyperlipidemia Hyperlipidemia managed with Crestor . - Discussed importance of managing cholesterol levels to reduce vascular risk.  Major depressive disorder Major depressive disorder managed with Lexapro . Potential impact of depression on cognitive function, including pseudodementia.  Right lower extremity weakness  after back surgery Right lower extremity weakness following back surgery in February last year. No recent falls, but requires assistance for certain activities. Residual pain on the left side, possibly related to back surgery. - Continue physical therapy.     1. Mild cognitive impairment      Patient Instructions  Continue current medications  Will check TSH and B12 level at PCP office today  Continue to follow up with PCP and return if symptoms do get worse.   There are well-accepted and sensible ways to reduce risk for Alzheimers disease and other degenerative brain disorders .  Exercise Daily Walk A daily 20 minute walk should be part of your routine. Disease related apathy can be a significant roadblock to exercise and the only way to overcome this is to make it a daily routine and perhaps have a reward at the end (something your loved one loves to eat or drink perhaps) or a personal trainer coming to the home can also be very useful. Most importantly, the patient is much more likely to exercise if the caregiver / spouse does it with him/her. In general a structured, repetitive schedule is best.  General Health: Any diseases which effect your body will effect your brain such as a pneumonia, urinary infection, blood clot,  heart attack or stroke. Keep contact with your primary care doctor for regular follow ups.  Sleep. A good nights sleep is healthy for the brain. Seven hours is recommended. If you have insomnia or poor sleep habits we can give you some instructions. If you have sleep apnea wear your mask.  Diet: Eating a heart healthy diet is also a good idea; fish and poultry instead of red meat, nuts (mostly non-peanuts), vegetables, fruits, olive oil or canola oil (instead of butter), minimal salt (use other spices to flavor foods), whole grain rice, bread, cereal and pasta and wine in moderation.Research is now showing that the MIND diet, which is a combination of The Mediterranean  diet and the DASH diet, is beneficial for cognitive processing and longevity. Information about this diet can be found in The MIND Diet, a book by Annitta Feeling, MS, RDN, and online at [employer].es  Finances, Power of [address] and Advance Directives: You should consider putting legal safeguards in place with regard to financial and medical decision making. While the spouse always has power of attorney for medical and financial issues in the absence of any form, you should consider what you want in case the spouse / caregiver is no longer around or capable of making decisions.  No orders of the defined types were placed in this encounter.   No orders of the defined types were placed in this encounter.   Return if symptoms worsen or fail to improve.  I have spent a total of 65 minutes dedicated to this patient today, preparing to see patient, performing a medically appropriate examination and evaluation, ordering tests and/or medications and procedures, and counseling and educating the patient/family/caregiver; independently interpreting result and communicating results to the family/patient/caregiver; and documenting clinical information in the electronic medical record.   Pastor Falling, MD 09/30/2023, 1:16 PM  [facility] [address], [address] [place], [place] [address] [phone]

## 2023-10-05 DIAGNOSIS — I10 Essential (primary) hypertension: Secondary | ICD-10-CM | POA: Diagnosis not present

## 2023-10-05 DIAGNOSIS — E11649 Type 2 diabetes mellitus with hypoglycemia without coma: Secondary | ICD-10-CM | POA: Diagnosis not present

## 2023-10-05 DIAGNOSIS — E7800 Pure hypercholesterolemia, unspecified: Secondary | ICD-10-CM | POA: Diagnosis not present

## 2023-10-05 DIAGNOSIS — I700 Atherosclerosis of aorta: Secondary | ICD-10-CM | POA: Diagnosis not present

## 2023-10-05 DIAGNOSIS — N183 Chronic kidney disease, stage 3 unspecified: Secondary | ICD-10-CM | POA: Diagnosis not present

## 2023-10-07 DIAGNOSIS — M5459 Other low back pain: Secondary | ICD-10-CM | POA: Diagnosis not present

## 2023-10-07 DIAGNOSIS — M47816 Spondylosis without myelopathy or radiculopathy, lumbar region: Secondary | ICD-10-CM | POA: Diagnosis not present

## 2023-10-07 DIAGNOSIS — M5416 Radiculopathy, lumbar region: Secondary | ICD-10-CM | POA: Diagnosis not present

## 2023-10-07 DIAGNOSIS — R9431 Abnormal electrocardiogram [ECG] [EKG]: Secondary | ICD-10-CM | POA: Diagnosis not present

## 2023-10-07 DIAGNOSIS — F411 Generalized anxiety disorder: Secondary | ICD-10-CM | POA: Diagnosis not present

## 2023-10-08 DIAGNOSIS — R7401 Elevation of levels of liver transaminase levels: Secondary | ICD-10-CM | POA: Diagnosis not present

## 2023-10-13 DIAGNOSIS — E119 Type 2 diabetes mellitus without complications: Secondary | ICD-10-CM | POA: Diagnosis not present

## 2023-10-13 DIAGNOSIS — Z79899 Other long term (current) drug therapy: Secondary | ICD-10-CM | POA: Diagnosis not present

## 2023-10-13 DIAGNOSIS — N390 Urinary tract infection, site not specified: Secondary | ICD-10-CM | POA: Diagnosis not present

## 2023-10-20 NOTE — Progress Notes (Signed)
 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  =========================================================   09/12/22-   78 yo F never smoker -followed for OSA, complicated by HBP, CAD, GERD/cough, DM2, Hyperlipidemia, Covid infection May 2022, Rheumatoid Arthritis, DDD, Glaucoma,  CPAP auto 8-15/ [employer]                   AirSense 10 AutoSet machine Download- compliance   73%, AHI 1.1/ hr  Body weight today-172 lbs Download reviewed.  Compliance goals discussed.  Sleeping okay with CPAP, currently using a rental machine. Had lumbar spine surgery in February and is only beginning to move a bit better now.  10/21/23- 78 yo F never smoker -followed for OSA, complicated by HBP, CAD, GERD/cough, DM2, Hyperlipidemia, Covid infection May 2022, Rheumatoid Arthritis, DDD, Glaucoma,  CPAP auto 8-15/ [employer]                   AirSense 10 AutoSet machine   Replaced 09/12/22 Download- compliance   63%, AHI 1.1/hr Body weight today-179 lbs Neurology following for some cognition/ memory changes. Discussed the use of AI scribe software for clinical note transcription with the patient, who gave verbal consent to proceed.  History of Present Illness   Tammy Pineda is an 78 year old female followed for OSA who presents with weakness in her right leg and difficulty breathing.   She has 2 CPAP machines.  1 remains in camper for travel.  She has been experiencing some shortness of breath, particularly noticeable when singing in the choir, where she can only sing four measures before needing to stop. This issue has been present since her L2-3 back surgery. A nurse practitioner suggested possible COPD and recommended a sleep study due to low lung sounds. However, her family physician confirmed normal lung and heart sounds.     Assessment and Plan:    OSA Satisfactory compliance and  control with continued benefit. -continue CPAP   Right lower extremity weakness with recurrent falls Chronic weakness post-back surgery with recurrent falls, difficulty using a cane, and tripping on the right side.  Exertional dyspnea Exertional dyspnea post-back surgery with possible decreased activity and weight gain. Differential includes paralyzed diaphragm or pulmonary issues. Spirometry done; formal pulmonary function test needed. - Order chest X-ray for diaphragm or structural issues. - Order formal pulmonary function test. - Administer influenza vaccination.     ROS-see HPI Constitutional:   No-   weight loss, night sweats, fevers, chills, +fatigue, lassitude. HEENT:   No-  headaches, difficulty swallowing, tooth/dental problems, sore throat,  pain L jaw      No-  sneezing, itching, ear ache, +nasal congestion, post nasal drip,  CV:  No-   chest pain, orthopnea, PND, swelling in lower extremities, anasarca, dizziness, palpitations Resp:  shortness of breath with exertion or at rest.              productive cough,  non-productive cough,  No- coughing up of blood.              No-   change in color of mucus.  No- wheezing.   Skin: No-   rash or lesions. GI:  No-   heartburn, +indigestion, abdominal pain, nausea, vomiting, GU:  MS:  No-   joint pain or swelling. Neuro-     nothing unusual Psych:  No- change in mood or affect. No  depression or anxiety.  No memory loss.  OBJ- Physical Exam General- Alert, Oriented, Affect-appropriate, Distress- none acute. Medium build, looks well Skin- rash-none, lesions- none, excoriation- none Lymphadenopathy- none Head- atraumatic            Eyes- Gross vision intact, PERRLA, conjunctivae and secretions clear            Ears- Hearing, canals-normal.             Nose- Clear, + mild external deviation, +more narrow on the right,  No-mucus, polyps,  erosion, perforation             Throat- Mallampati II , mucosa clear , drainage- none,  tonsils- atrophic. Teeth and                                 oropharynx seem ok. Neck- flexible , trachea midline, no stridor , thyroid  nl, carotid no bruit Chest - symmetrical excursion , unlabored           Heart/CV- RRR , no murmur , no gallop  , no rub, nl s1 s2                           - JVD- none , edema- none, stasis changes- none, varices- none           Lung- clear, unlabored, wheeze- none, cough-none , dullness-none, rub- none           Chest wall-  Abd-  Br/ Gen/ Rectal- Not done, not indicated Extrem- cyanosis- none, clubbing, none, atrophy- none, strength- nl Neuro- grossly intact to observation

## 2023-10-21 ENCOUNTER — Ambulatory Visit: Admitting: Internal Medicine

## 2023-10-21 ENCOUNTER — Encounter: Payer: Self-pay | Admitting: Internal Medicine

## 2023-10-21 ENCOUNTER — Ambulatory Visit (INDEPENDENT_AMBULATORY_CARE_PROVIDER_SITE_OTHER)

## 2023-10-21 VITALS — BP 122/64 | HR 66 | Temp 97.9°F | Ht 66.0 in | Wt 179.2 lb

## 2023-10-21 DIAGNOSIS — R531 Weakness: Secondary | ICD-10-CM | POA: Diagnosis not present

## 2023-10-21 DIAGNOSIS — R0609 Other forms of dyspnea: Secondary | ICD-10-CM | POA: Diagnosis not present

## 2023-10-21 DIAGNOSIS — Z9181 History of falling: Secondary | ICD-10-CM

## 2023-10-21 DIAGNOSIS — Z981 Arthrodesis status: Secondary | ICD-10-CM | POA: Diagnosis not present

## 2023-10-21 DIAGNOSIS — G4733 Obstructive sleep apnea (adult) (pediatric): Secondary | ICD-10-CM | POA: Diagnosis not present

## 2023-10-21 DIAGNOSIS — I700 Atherosclerosis of aorta: Secondary | ICD-10-CM | POA: Diagnosis not present

## 2023-10-21 NOTE — Patient Instructions (Signed)
 Wee can keep current CPAP settings.  Order- PFT   dx dyspnea on exertion  Order- CXR    dx Dyspnea on exertion

## 2023-10-22 DIAGNOSIS — M5416 Radiculopathy, lumbar region: Secondary | ICD-10-CM | POA: Diagnosis not present

## 2023-10-22 DIAGNOSIS — G894 Chronic pain syndrome: Secondary | ICD-10-CM | POA: Diagnosis not present

## 2023-10-23 ENCOUNTER — Ambulatory Visit: Payer: Self-pay | Admitting: Internal Medicine

## 2023-10-23 DIAGNOSIS — Z1231 Encounter for screening mammogram for malignant neoplasm of breast: Secondary | ICD-10-CM | POA: Diagnosis not present

## 2023-10-24 ENCOUNTER — Other Ambulatory Visit: Payer: Self-pay | Admitting: Internal Medicine

## 2023-10-24 DIAGNOSIS — G894 Chronic pain syndrome: Secondary | ICD-10-CM | POA: Diagnosis not present

## 2023-10-27 DIAGNOSIS — R1032 Left lower quadrant pain: Secondary | ICD-10-CM | POA: Diagnosis not present

## 2023-10-27 DIAGNOSIS — Z6829 Body mass index (BMI) 29.0-29.9, adult: Secondary | ICD-10-CM | POA: Diagnosis not present

## 2023-10-27 DIAGNOSIS — R3915 Urgency of urination: Secondary | ICD-10-CM | POA: Diagnosis not present

## 2023-10-27 DIAGNOSIS — G894 Chronic pain syndrome: Secondary | ICD-10-CM | POA: Diagnosis not present

## 2023-10-27 DIAGNOSIS — R8290 Unspecified abnormal findings in urine: Secondary | ICD-10-CM | POA: Diagnosis not present

## 2023-11-04 DIAGNOSIS — E1122 Type 2 diabetes mellitus with diabetic chronic kidney disease: Secondary | ICD-10-CM | POA: Diagnosis not present

## 2023-11-04 DIAGNOSIS — G4733 Obstructive sleep apnea (adult) (pediatric): Secondary | ICD-10-CM | POA: Diagnosis not present

## 2023-11-04 DIAGNOSIS — N1832 Chronic kidney disease, stage 3b: Secondary | ICD-10-CM | POA: Diagnosis not present

## 2023-11-04 DIAGNOSIS — G894 Chronic pain syndrome: Secondary | ICD-10-CM | POA: Diagnosis not present

## 2023-11-04 DIAGNOSIS — I2510 Atherosclerotic heart disease of native coronary artery without angina pectoris: Secondary | ICD-10-CM | POA: Diagnosis not present

## 2023-11-04 DIAGNOSIS — E785 Hyperlipidemia, unspecified: Secondary | ICD-10-CM | POA: Diagnosis not present

## 2023-11-17 ENCOUNTER — Encounter: Payer: Self-pay | Admitting: Internal Medicine

## 2023-11-17 ENCOUNTER — Ambulatory Visit: Admitting: Internal Medicine

## 2023-11-17 VITALS — BP 120/70 | HR 64 | Ht 65.0 in | Wt 178.8 lb

## 2023-11-17 DIAGNOSIS — E559 Vitamin D deficiency, unspecified: Secondary | ICD-10-CM

## 2023-11-17 DIAGNOSIS — E785 Hyperlipidemia, unspecified: Secondary | ICD-10-CM | POA: Diagnosis not present

## 2023-11-17 DIAGNOSIS — M810 Age-related osteoporosis without current pathological fracture: Secondary | ICD-10-CM | POA: Diagnosis not present

## 2023-11-17 DIAGNOSIS — E1165 Type 2 diabetes mellitus with hyperglycemia: Secondary | ICD-10-CM

## 2023-11-17 DIAGNOSIS — Z7984 Long term (current) use of oral hypoglycemic drugs: Secondary | ICD-10-CM

## 2023-11-17 LAB — POCT GLYCOSYLATED HEMOGLOBIN (HGB A1C): Hemoglobin A1C: 6.3 % — AB (ref 4.0–5.6)

## 2023-11-17 MED ORDER — GLIPIZIDE ER 2.5 MG PO TB24: ORAL_TABLET | ORAL | Status: AC

## 2023-11-17 NOTE — Progress Notes (Signed)
 Patient ID: Tammy Pineda, female   DOB: 1946-01-29, 78 y.o.   MRN: [id number]   HPI: Tammy Pineda is a 78 y.o.-year-old female, initially referred by her PCP, Dr. Gwenn, returning for follow-up for DM2, dx ~2019, non-insulin -dependent, uncontrolled, with complications (CAD, CKD stage III, NASH) and clinical osteoporosis. Deward Fife, her husband, is also my pt. Last visit 5 months ago.  Interim history: She fell while dancing recently >> no fractures.  Right leg is weaker.  She has had problems with the right knee in the past. No blurry vision, nausea, chest pain.  She has leg swelling. She is seeing GI (Dr. Rosalie) for IBS with diarrhea.  She has a history of diverticulitis episodes.  DM2: Reviewed HbA1c levels: 06/19/2023: HbA1c 6.9% 02/27/2023: HbA1c 7.3% reportedly Lab Results  Component Value Date   HGBA1C 6.8 (A) 12/26/2022   HGBA1C 6.5 (A) 06/24/2022   HGBA1C 6.6 (A) 02/21/2022   HGBA1C 6.3 (A) 10/22/2021   HGBA1C 6.7 (A) 06/18/2021   HGBA1C 6.5 (A) 03/15/2021   HGBA1C 6.5 (A) 11/30/2020   HGBA1C 6.1 (A) 07/27/2020   HGBA1C 6.1 (A) 04/18/2020   HGBA1C 6.7 (A) 08/11/2019   HGBA1C 6.9 (A) 05/03/2019   HGBA1C 6.4 (A) 02/08/2019   HGBA1C 5.9 10/08/2018   HGBA1C 6.2 (A) 08/04/2018  11/25/2019: HbA1c 6.5% 02/26/2018: HbA1c 6.5% 09/20/2017: HbA1c 7.3% 04/18/2017: HbA1c 7.6%  Previously on: - Metformin  1000 mg 2x a day, with meals -added 11/2017 - had significant diarrhea >> metformin  ER 1000 mg 2x a day which she was tolerating well, however, she then started to have severe diarrhea >> she stopped in 09/2018. We tried Farxiga  in the past but this was too expensive.  Currently on: - Glipizide  XL 5 mg before breakfast and 2.5 mg before a larger dinner >> actually taking 2.5 mg 2x a day after meals! >>  Moved before meals but takes it 3 times a day >> decreased to 2 times a day We tried Jardiance  in 03/2021 and this was too expensive.  Pt checks her sugars > 4x a  day:  Previously:  Previously:  Lowest sugar was 50 >> ... 85; it is unclear at which level she has hypoglycemia awareness. Highest sugar was 396 (steroids) >> 215.  Glucometer: One Touch verio  Pt's meals are: - Breakfast: eggs, cereal + fruit, egg waffles and sugar-free syrup  - Lunch: salad, grilled chicken - Dinner: baked meat, veggies, starch  - Snacks: 1-2x - at bedtime - granola  In the past, she was exercising at [employer] and also dancing 2-3 times a week.  However, she stopped during the coronavirus pandemic.  [facility]  -+ CKD stage III, last BUN/creatinine:  06/20/2023: 21/1.01, GFR 56, glucose 172 Lab Results  Component Value Date   BUN 26 (H) 05/15/2023   BUN 19 03/25/2022   CREATININE 0.95 05/15/2023   CREATININE 0.96 03/25/2022   Lab Results  Component Value Date   MICRALBCREAT 4 07/17/2023  On losartan  50.  -+ HL; last set of lipids available for review: 06/19/2023: 168/404/51/56 12/20/2022: 157/186/50/76 05/15/2022: 167/319/48/76 Lab Results  Component Value Date   CHOL 139 11/30/2020   HDL 42.70 11/30/2020   LDLCALC 70 12/22/2009   LDLDIRECT 59.0 11/30/2020   TRIG 278.0 (H) 11/30/2020   CHOLHDL 3 11/30/2020  11/25/2019: 151/268/43/65 On Crestor  5. I rec'd  fish oil 1000 mg daily after last visit >> tried >> indigestion.  - last eye exam was 07/09/2023: +  Glaucoma, no DR reportedly. Dr. Octavia.  She has a history of cataract surgery.  - no numbness and tingling in her feet.  Foot exam performed 07/17/2023.  Pt has FH of DM in father and brother.   Osteoporosis (OP):  I reviewed pt's DXA scans: Date L1-L4 T score FN T score 33% distal Radius Ultra distal radius  06/01/2021 N/a RFN: -2.1 LFN: -2.4 -1.7 (-3.4%) -1.5  12/25/2017 N/a  RFN: -0.9 LFN: -2.1 -1.5 -1.8  FRAX: MOF 24.2%, 10-year hip fracture risk 7.2%  She has the following fractures: Left wrist - in her 46s Skull - 01/2019 - tripped in the driveway  We  started Reclast : 1st dose 07/04/2021 2nd dose 07/09/2022 3rd dose: 08/06/2023.  She tolerated it well, without jaw/hip/thigh pain.  No dizziness/vertigo/orthostasis/poor vision.   No previous OP treatments.  +h/o vitamin D  insufficiency. Reviewed available vit D levels: 06/19/2023: Vitamin D  27.9 - dose increased to 4000 units daily >> increased to 5000 uits daily 12/17/2022: vitamin D  28.7 05/2022: Vitamin D  was reportedly normal (I do not have these records) Lab Results  Component Value Date   VD25OH 26.22 (L) 06/18/2021  10/29/2017: Vitamin D  35  Pt is on: - vitamin D   - 2000 >> 5000 units daily>> then 2000 units >> increased the dose to 4000 units 06/2023  No weight bearing exercises. R shoulder rotator cuff.   She does not take high vitamin A doses.  Menopause was at 77 y/o - postsurgical - HRT - stopped at 43.   FH of osteoporosis: sister - hip fracture.  No h/o persistent hyper/hypocalcemia or hyperparathyroidism. No h/o kidney stones. Lab Results  Component Value Date   CALCIUM  9.7 05/15/2023   CALCIUM  9.1 03/25/2022   CALCIUM  8.9 03/22/2022   CALCIUM  8.7 (L) 03/21/2022   CALCIUM  9.0 03/11/2022   CALCIUM  9.6 06/18/2021   CALCIUM  8.5 (L) 01/04/2020   CALCIUM  9.6 12/22/2019   CALCIUM  9.1 05/16/2019   CALCIUM  9.8 02/08/2019   No h/o thyrotoxicosis. Reviewed TSH recent levels:  06/20/2023: TSH 1.25 Lab Results  Component Value Date   TSH 2.55 10/19/2009   TSH 1.55 05/09/2006   She has a history of GERD. She also has a history of HTN-on Lasix as needed, IBS with diarrhea, GERD, vaginal prolapse - h/o surgery. She also has NASH with stage 2 fibrosis- in a study.  She was previously in another study that was using a drug which helped her significantly (HbA1c dropped to 6.2%), however, the study was stopped and the drug is not available.  She sees Dr. Ishmael for RA.  On Remicade . She had left TKR 01/03/2020 - had a lot of pain after the surgery.  She also had a DVT  in the L leg - on Xarelto x 3 mo. She has repeated UTIs. She had shoulder surgery in 10/2021. She is seen in neurosurgery >> gets Lidocaine  injections.   ROS: + see HPI  I reviewed pt's medications, allergies, PMH, social hx, family hx, and changes were documented in the history of present illness. Otherwise, unchanged from my initial visit note.  Past Medical History:  Diagnosis Date   Anxiety    Arthritis    Chronic kidney disease    Coronary artery disease    nonobstructive by cardiac catheterization 2011 with a 30% LAD lesion   Depression    Diabetes (HCC)    Type 2   GERD (gastroesophageal reflux disease)    Glaucoma    Hypercholesteremia    Hypertension  NASH (nonalcoholic steatohepatitis)    NASH   Pneumonia    30 years ago   PVC (premature ventricular contraction)    Seasonal allergies    Sleep apnea    Spinal stenosis    Past Surgical History:  Procedure Laterality Date   ANTERIOR AND POSTERIOR VAGINAL REPAIR     APPENDECTOMY  1959   BLADDER SUSPENSION     BUNIONECTOMY     CARDIAC CATHETERIZATION  2011   clean cath   CARDIOVASCULAR STRESS TEST  2022   CATARACT EXTRACTION Left 2015   CATARACT EXTRACTION W/ INTRAOCULAR LENS IMPLANT Right 2010   KNEE ARTHROSCOPY Left    NASAL RECONSTRUCTION  1976   ROBOTIC ASSISTED LAPAROSCOPIC SACROCOLPOPEXY     with mesh, urethral sling, posterior vaginal repair   SHOULDER ARTHROSCOPY Left    SHOULDER SURGERY Right 10/16/2021   repair   TOTAL ABDOMINAL HYSTERECTOMY  1976   TOTAL KNEE ARTHROPLASTY Left 01/03/2020   Procedure: TOTAL KNEE ARTHROPLASTY;  Surgeon: Melodi Lerner, MD;  Location: [facility];  Service: Orthopedics;  Laterality: Left;    TRANSFORAMINAL LUMBAR INTERBODY FUSION W/ MIS 2 LEVEL Right 03/14/2022   Procedure: Minimally Invasive Surgery Decompression, Transforaminal Lumbar Interbody Fusion Lumbar three-four, Lumbar four-five;  Surgeon: Dawley, Lani BROCKS, DO;  Location: [facility];  Service: Neurosurgery;   Laterality: Right;   Social History   Socioeconomic History   Marital status: Married    Spouse name: Not on file   Number of children: 1   Years of education: Not on file   Highest education level: Not on file  Occupational History   Occupation: Charity fundraiser - retired    Associate Professor: [employer]     Comment: [employer]  Tobacco Use   Smoking status: Never Smoker   Smokeless tobacco: Never Used  Substance and Sexual Activity   Alcohol use: No   Drug use: No   Current Outpatient Medications on File Prior to Visit  Medication Sig Dispense Refill   Accu-Chek Softclix Lancets lancets Use to check blood sugar once a day DxCode:E11.65 100 each 12   ALPRAZolam  (XANAX ) 0.5 MG tablet Take 0.5 tablets (0.25 mg total) by mouth at bedtime as needed for anxiety. 30 tablet 0   aspirin  EC (ASPIRIN  81) 81 MG tablet Take 1 tablet (81 mg total) by mouth daily. 30 tablet 0   bisacodyl  (DULCOLAX) 5 MG EC tablet Take 1 tablet (5 mg total) by mouth daily as needed for moderate constipation. 30 tablet 0   Blood Glucose Monitoring Suppl (ACCU-CHEK GUIDE) w/Device KIT Use to check blood sugar once a day DxCode:E11.65 1 kit 0   brimonidine  (ALPHAGAN ) 0.2 % ophthalmic solution Place 1 drop into both eyes 2 (two) times daily.     Cholecalciferol  125 MCG (5000 UT) TABS Take 1 tablet (5,000 Units total) by mouth daily. (Patient taking differently: Take 10,000 Units by mouth daily. Take 2 a day) 30 tablet 0   Continuous Glucose Receiver (FREESTYLE LIBRE 2 READER) DEVI 1 each by Does not apply route daily. Use GoodRx 1 each 0   Continuous Glucose Sensor (FREESTYLE LIBRE 2 PLUS SENSOR) MISC Inject 1 each into the skin as directed. Change sensor every 15 days 6 each 3   Cyanocobalamin  (VITAMIN B-12) 5000 MCG TBDP Take 1,000 mcg by mouth See admin instructions. Takes on Monday and Thursday 30 tablet 0   diclofenac  Sodium (VOLTAREN ) 1 % GEL Apply 2 g topically 4 (four) times daily. 100 g 0   dicyclomine  (  BENTYL ) 10 MG capsule  Take 1 capsule (10 mg total) by mouth 3 (three) times daily as needed for spasms. 10 capsule 0   escitalopram  (LEXAPRO ) 10 MG tablet Take 1 tablet (10 mg total) by mouth at bedtime. 30 tablet 0   glipiZIDE  (GLUCOTROL  XL) 2.5 MG 24 hr tablet TAKE 2 TABLETS BY MOUTH DAILY IN THE MORNING AND 1 IN THE EVENING 270 tablet 1   glucose blood (ACCU-CHEK GUIDE TEST) test strip Use to check blood sugar once a day DxCode:E11.65 100 each 12   hydrochlorothiazide  (HYDRODIURIL ) 12.5 MG tablet Take 1 tablet (12.5 mg total) by mouth daily. 30 tablet 0   HYDROcodone -Acetaminophen  5-300 MG TABS Take 0.5 mg by mouth.     latanoprost  (XALATAN ) 0.005 % ophthalmic solution Place 1 drop into both eyes at bedtime.     losartan  (COZAAR ) 25 MG tablet Take 1 tablet (25 mg total) by mouth at bedtime. 30 tablet 0   meclizine  (ANTIVERT ) 12.5 MG tablet Take 1 tablet (12.5 mg total) by mouth 3 (three) times daily as needed for dizziness. 21 tablet 0   methocarbamol  (ROBAXIN ) 500 MG tablet Take 1 tablet (500 mg total) by mouth every 6 (six) hours as needed for muscle spasms. 60 tablet 0   omeprazole  (PRILOSEC) 20 MG capsule Take 1 capsule (20 mg total) by mouth daily as needed (acid reflux/indigestion). 30 capsule 0   polyethylene glycol (MIRALAX  / GLYCOLAX ) 17 g packet Take 17 g by mouth daily. 14 each 0   rosuvastatin  (CRESTOR ) 5 MG tablet Take 1 tablet (5 mg total) by mouth at bedtime. 30 tablet 0   timolol  (TIMOPTIC ) 0.5 % ophthalmic solution Place 1 drop into both eyes daily.     Current Facility-Administered Medications on File Prior to Visit  Medication Dose Route Frequency Provider Last Rate Last Admin   regadenoson  (LEXISCAN ) injection SOLN 0.4 mg  0.4 mg Intravenous Once Lonni Slain, MD       technetium tetrofosmin  (TC-MYOVIEW ) injection 30.9 millicurie  30.9 millicurie Intravenous Once PRN Lonni Slain, MD       Allergies  Allergen Reactions   Metformin  And Related Diarrhea   Gabapentin  Palpitations   Hydrocodone  Itching   Plaquenil [Hydroxychloroquine] Nausea And Vomiting   Tylenol  [Acetaminophen ] Other (See Comments)    History of Liver Disease   Sulfamethoxazole-Trimethoprim Rash   Tape Rash   Family History  Problem Relation Age of Onset   Heart disease Father        open heart surgery at 60   Colon cancer Father    Heart disease Mother        open heart surgery at age 63   Hypertension Mother    Heart attack Maternal Grandfather    Stroke Paternal Grandmother    Heart block Brother    PE: BP 120/70   Pulse 64   Ht 5' 5 (1.651 m)   Wt 178 lb 12.8 oz (81.1 kg)   SpO2 96%   BMI 29.75 kg/m   Wt Readings from Last 3 Encounters:  11/17/23 178 lb 12.8 oz (81.1 kg)  10/21/23 179 lb 3.2 oz (81.3 kg)  08/06/23 179 lb 3.2 oz (81.3 kg)   Constitutional: overweight, in NAD Eyes:  EOMI, no exophthalmos ENT: no neck masses, no cervical lymphadenopathy, + B pitting edema Cardiovascular: RRR, No MRG Respiratory: CTA B Musculoskeletal: no deformities Skin:no rashes Neurological: no tremor with outstretched hands  ASSESSMENT: 1. DM2, non-insulin -dependent, uncontrolled, with long-term complications - CAD - CKD stage  III - NASH  2. HL  3. OP  4.  Vitamin D  deficiency  PLAN:  1. Patient with type 2 diabetes, on sulfonylurea only, with fairly good control.  At last visit, HbA1c was 6.9%, slightly lower, at goal.  We discussed about the possibility of using an SGLT2 inhibitor or GLP-1 receptor agonist but did not start yet.  She did mention that her husband was obtaining Ozempic through the [employer] and she wanted to check with them before agreeing to start this. -Reviewing the CGM trends at last visit, sugars appears to be mainly well-controlled, with only slight increases after breakfast and occasionally higher blood sugars after dinner.  We did not change the regimen. CGM interpretation: -At today's visit, we reviewed her CGM downloads: It appears  that 93% of values are in target range (goal >70%), while 5% are higher than 180 (goal <25%), and 2% are lower than 70 (goal <4%).  The calculated average blood sugar is 121.  The projected HbA1c for the next 3 months (GMI) is 6.2%. -Reviewing the CGM trends, sugars are excellent, almost all fluctuating within the target range, with only rare exceptions after meals. -No need to change the regimen for now. -I suggested to: Patient Instructions  Please continue: - Glipizide  XL 2.5 mg 2x daily before b'fast and before dinner  Please return in 4 months.  - we checked her HbA1c: 6.3% (lower, excellent close - advised to check sugars at different times of the day - 4x a day, rotating check times - advised for yearly eye exams >> she is UTD - return to clinic in 4 months  2. HL - Latest lipid panel was reviewed from 06/19/2023: Triglycerides much higher, LDL improved: 168/404/51/56  - She continues Crestor  5 mg daily without side effects we tried fish oil 1000 mg daily but developed indigestion and stopped.   3. OP - Likely menopausal/age-related and she also has a family history of osteoporosis - Her bone density scores from 2023 showed a high risk for fracture reflected in the FRAX score. - Discussed about fall precautions, weightbearing exercises, optimizing vitamin D  levels - We also started Reclast  in 06/2021 and she had 2 other doses, in 07/2022 and 08/2023.  She tolerated it well. - Will recheck her bone density now -will switch to [place] office - we may be able to stop Reclast  afterwards  4.  Vitamin D  insufficiency - On vitamin D  5000 units daily - Latest vitamin D  level was still low, at 27.9 on 06/20/2023 - She does mention that she had another level obtained by PCP in 10/2023 but I do not have these records.  She does mention that this was normal.  We will not recheck this today  Lela Fendt, MD PhD [facility] Endocrin[facility]

## 2023-11-17 NOTE — Patient Instructions (Addendum)
 Please continue: - Glipizide  XL 2.5 mg 2x daily before b'fast and before dinner  Please call and schedule bone density scan at the [facility] Office: [phone].   Please return in 4 months.

## 2023-11-25 DIAGNOSIS — M4726 Other spondylosis with radiculopathy, lumbar region: Secondary | ICD-10-CM | POA: Diagnosis not present

## 2023-12-02 DIAGNOSIS — R300 Dysuria: Secondary | ICD-10-CM | POA: Diagnosis not present

## 2023-12-02 DIAGNOSIS — N3001 Acute cystitis with hematuria: Secondary | ICD-10-CM | POA: Diagnosis not present

## 2023-12-18 ENCOUNTER — Other Ambulatory Visit (HOSPITAL_COMMUNITY): Payer: Self-pay | Admitting: Family Medicine

## 2023-12-18 DIAGNOSIS — W19XXXA Unspecified fall, initial encounter: Secondary | ICD-10-CM

## 2023-12-18 DIAGNOSIS — R296 Repeated falls: Secondary | ICD-10-CM | POA: Diagnosis not present

## 2023-12-18 DIAGNOSIS — E538 Deficiency of other specified B group vitamins: Secondary | ICD-10-CM | POA: Diagnosis not present

## 2023-12-18 DIAGNOSIS — Z23 Encounter for immunization: Secondary | ICD-10-CM | POA: Diagnosis not present

## 2023-12-18 DIAGNOSIS — E7800 Pure hypercholesterolemia, unspecified: Secondary | ICD-10-CM | POA: Diagnosis not present

## 2023-12-18 DIAGNOSIS — Z0000 Encounter for general adult medical examination without abnormal findings: Secondary | ICD-10-CM | POA: Diagnosis not present

## 2023-12-18 DIAGNOSIS — N390 Urinary tract infection, site not specified: Secondary | ICD-10-CM | POA: Diagnosis not present

## 2023-12-18 DIAGNOSIS — E1122 Type 2 diabetes mellitus with diabetic chronic kidney disease: Secondary | ICD-10-CM | POA: Diagnosis not present

## 2023-12-18 DIAGNOSIS — E559 Vitamin D deficiency, unspecified: Secondary | ICD-10-CM | POA: Diagnosis not present

## 2023-12-20 ENCOUNTER — Ambulatory Visit (HOSPITAL_BASED_OUTPATIENT_CLINIC_OR_DEPARTMENT_OTHER)
Admission: RE | Admit: 2023-12-20 | Discharge: 2023-12-20 | Disposition: A | Discharge status: Home / Self Care | Source: Ambulatory Visit | Attending: Family Medicine | Admitting: Family Medicine

## 2023-12-20 DIAGNOSIS — R519 Headache, unspecified: Secondary | ICD-10-CM | POA: Insufficient documentation

## 2023-12-20 DIAGNOSIS — J342 Deviated nasal septum: Secondary | ICD-10-CM | POA: Insufficient documentation

## 2023-12-20 DIAGNOSIS — W19XXXA Unspecified fall, initial encounter: Secondary | ICD-10-CM | POA: Insufficient documentation

## 2024-01-08 DIAGNOSIS — E119 Type 2 diabetes mellitus without complications: Secondary | ICD-10-CM | POA: Diagnosis not present

## 2024-01-08 DIAGNOSIS — H16223 Keratoconjunctivitis sicca, not specified as Sjogren's, bilateral: Secondary | ICD-10-CM | POA: Diagnosis not present

## 2024-01-08 DIAGNOSIS — Z961 Presence of intraocular lens: Secondary | ICD-10-CM | POA: Diagnosis not present

## 2024-01-08 DIAGNOSIS — H401222 Low-tension glaucoma, left eye, moderate stage: Secondary | ICD-10-CM | POA: Diagnosis not present

## 2024-01-15 DIAGNOSIS — M4726 Other spondylosis with radiculopathy, lumbar region: Secondary | ICD-10-CM | POA: Diagnosis not present

## 2024-01-15 DIAGNOSIS — M7918 Myalgia, other site: Secondary | ICD-10-CM | POA: Diagnosis not present

## 2024-01-16 DIAGNOSIS — F419 Anxiety disorder, unspecified: Secondary | ICD-10-CM | POA: Diagnosis not present

## 2024-01-18 ENCOUNTER — Emergency Department (HOSPITAL_BASED_OUTPATIENT_CLINIC_OR_DEPARTMENT_OTHER)

## 2024-01-18 ENCOUNTER — Emergency Department (HOSPITAL_BASED_OUTPATIENT_CLINIC_OR_DEPARTMENT_OTHER)
Admission: EM | Admit: 2024-01-18 | Discharge: 2024-01-18 | Disposition: A | Discharge status: Home / Self Care | Attending: Emergency Medicine | Admitting: Emergency Medicine

## 2024-01-18 ENCOUNTER — Encounter (HOSPITAL_BASED_OUTPATIENT_CLINIC_OR_DEPARTMENT_OTHER): Payer: Self-pay | Admitting: Emergency Medicine

## 2024-01-18 DIAGNOSIS — S4992XA Unspecified injury of left shoulder and upper arm, initial encounter: Secondary | ICD-10-CM | POA: Insufficient documentation

## 2024-01-18 DIAGNOSIS — Z96652 Presence of left artificial knee joint: Secondary | ICD-10-CM | POA: Insufficient documentation

## 2024-01-18 DIAGNOSIS — W1839XA Other fall on same level, initial encounter: Secondary | ICD-10-CM | POA: Insufficient documentation

## 2024-01-18 DIAGNOSIS — M25562 Pain in left knee: Secondary | ICD-10-CM | POA: Insufficient documentation

## 2024-01-18 DIAGNOSIS — Z7982 Long term (current) use of aspirin: Secondary | ICD-10-CM | POA: Insufficient documentation

## 2024-01-18 NOTE — ED Provider Notes (Signed)
 [place] [facility] Provider Note   CSN: [id number] Arrival date & time: 01/18/24  1157     Patient presents with: Fall   Tammy Pineda is a 78 y.o. female.  She is here for evaluation of injuries after a fall yesterday.  She said she tripped over a door landing and landed on her left shoulder.  No loss of consciousness.  Complaining of severe left shoulder pain worse with movement.  Also has an abrasion over her left knee and left knee pain.  Prior history of left knee replacement.  She denies hitting her head or losing consciousness.  She is not on a blood thinner.   The history is provided by the patient.  Fall This is a new problem. The current episode started yesterday. The problem has not changed since onset.Pertinent negatives include no chest pain, no abdominal pain, no headaches and no shortness of breath. Associated symptoms comments: Left shoulder and knee pain. The symptoms are aggravated by bending and twisting. Nothing relieves the symptoms. She has tried rest for the symptoms. The treatment provided no relief.       Prior to Admission medications  Medication Sig Start Date End Date Taking? Authorizing Provider  Accu-Chek Softclix Lancets lancets Use to check blood sugar once a day DxCode:E11.65 09/29/23   Trixie File, MD  ALPRAZolam  (XANAX ) 0.5 MG tablet Take 0.5 tablets (0.25 mg total) by mouth at bedtime as needed for anxiety. 03/28/22   Angiulli, Toribio PARAS, PA-C  aspirin  EC (ASPIRIN  81) 81 MG tablet Take 1 tablet (81 mg total) by mouth daily. 03/29/22   Dawley, Troy C, DO  bisacodyl  (DULCOLAX) 5 MG EC tablet Take 1 tablet (5 mg total) by mouth daily as needed for moderate constipation. 03/28/22   Angiulli, Toribio PARAS, PA-C  Blood Glucose Monitoring Suppl (ACCU-CHEK GUIDE) w/Device KIT Use to check blood sugar once a day DxCode:E11.65 09/29/23   Trixie File, MD  brimonidine  (ALPHAGAN ) 0.2 % ophthalmic solution Place 1 drop into  both eyes 2 (two) times daily.    [provider]  Cholecalciferol  125 MCG (5000 UT) TABS Take 1 tablet (5,000 Units total) by mouth daily. Patient taking differently: Take 10,000 Units by mouth daily. Take 2 a day 03/28/22   Pegge Toribio PARAS, PA-C  Continuous Glucose Receiver (FREESTYLE LIBRE 2 READER) DEVI 1 each by Does not apply route daily. Use GoodRx 02/13/23   Trixie File, MD  Continuous Glucose Sensor (FREESTYLE LIBRE 2 PLUS SENSOR) MISC Inject 1 each into the skin as directed. Change sensor every 15 days 08/15/23   Trixie File, MD  Cyanocobalamin  (VITAMIN B-12) 5000 MCG TBDP Take 1,000 mcg by mouth See admin instructions. Takes on Monday and Thursday 03/28/22   Angiulli, Toribio PARAS, PA-C  diclofenac  Sodium (VOLTAREN ) 1 % GEL Apply 2 g topically 4 (four) times daily. 03/28/22   Angiulli, Toribio PARAS, PA-C  dicyclomine  (BENTYL ) 10 MG capsule Take 1 capsule (10 mg total) by mouth 3 (three) times daily as needed for spasms. 03/28/22   Angiulli, Toribio PARAS, PA-C  escitalopram  (LEXAPRO ) 10 MG tablet Take 1 tablet (10 mg total) by mouth at bedtime. 03/28/22   Angiulli, Toribio PARAS, PA-C  glipiZIDE  (GLUCOTROL  XL) 2.5 MG 24 hr tablet TAKE 1 TABLET IN THE MORNING AND 1 IN THE EVENING 11/17/23   Trixie File, MD  glucose blood (ACCU-CHEK GUIDE TEST) test strip Use to check blood sugar once a day DxCode:E11.65 09/29/23   Trixie File, MD  hydrochlorothiazide  (HYDRODIURIL ) 12.5 MG tablet Take 1 tablet (12.5 mg total) by mouth daily. 03/28/22   Angiulli, Toribio PARAS, PA-C  HYDROcodone -Acetaminophen  5-300 MG TABS Take 0.5 mg by mouth. 07/29/18   [provider]  latanoprost  (XALATAN ) 0.005 % ophthalmic solution Place 1 drop into both eyes at bedtime.    [provider]  losartan  (COZAAR ) 25 MG tablet Take 1 tablet (25 mg total) by mouth at bedtime. 03/28/22   Angiulli, Toribio PARAS, PA-C  meclizine  (ANTIVERT ) 12.5 MG tablet Take 1 tablet (12.5 mg total) by mouth 3 (three) times daily  as needed for dizziness. 03/28/22   Angiulli, Toribio PARAS, PA-C  methocarbamol  (ROBAXIN ) 500 MG tablet Take 1 tablet (500 mg total) by mouth every 6 (six) hours as needed for muscle spasms. 03/28/22   Angiulli, Toribio PARAS, PA-C  omeprazole  (PRILOSEC) 20 MG capsule Take 1 capsule (20 mg total) by mouth daily as needed (acid reflux/indigestion). 03/28/22   Angiulli, Toribio PARAS, PA-C  rosuvastatin  (CRESTOR ) 5 MG tablet Take 1 tablet (5 mg total) by mouth at bedtime. 03/28/22   Angiulli, Toribio PARAS, PA-C  timolol  (TIMOPTIC ) 0.5 % ophthalmic solution Place 1 drop into both eyes daily. 08/19/19   [provider]    Allergies: Metformin  and related, Gabapentin, Hydrocodone , Plaquenil [hydroxychloroquine], Tylenol  [acetaminophen ], Sulfamethoxazole-trimethoprim, and Tape    Review of Systems  Respiratory:  Negative for shortness of breath.   Cardiovascular:  Negative for chest pain.  Gastrointestinal:  Negative for abdominal pain.  Neurological:  Negative for headaches.    Updated Vital Signs BP (!) 139/52   Pulse 71   Temp 97.8 F (36.6 C)   Resp 20   Ht 5' 5 (1.651 m)   Wt 79.8 kg   SpO2 97%   BMI 29.29 kg/m   Physical Exam Vitals and nursing note reviewed.  Constitutional:      General: She is not in acute distress.    Appearance: Normal appearance. She is well-developed.  HENT:     Head: Normocephalic and atraumatic.  Eyes:     Conjunctiva/sclera: Conjunctivae normal.  Cardiovascular:     Rate and Rhythm: Normal rate and regular rhythm.     Heart sounds: No murmur heard. Pulmonary:     Effort: Pulmonary effort is normal. No respiratory distress.     Breath sounds: Normal breath sounds. No stridor. No wheezing.  Abdominal:     Palpations: Abdomen is soft.     Tenderness: There is no abdominal tenderness. There is no guarding or rebound.  Musculoskeletal:        General: Tenderness and signs of injury present. No deformity.     Cervical back: Neck supple.     Comments: She has  diffuse tenderness around her left shoulder.  Clavicle nontender.  Elbow and wrist nontender.  She has some diffuse tenderness left knee.  Abrasion over patella.  No active bleeding or erythema.  Distal pulses motor and sensation intact.  Right upper and lower extremity full range of motion without any pain or limitations.  Skin:    General: Skin is warm and dry.  Neurological:     General: No focal deficit present.     Mental Status: She is alert.     GCS: GCS eye subscore is 4. GCS verbal subscore is 5. GCS motor subscore is 6.     (all labs ordered are listed, but only abnormal results are displayed) Labs Reviewed - No data to display  EKG: None  Radiology: DG Shoulder  Left Result Date: 01/18/2024 CLINICAL DATA:  Fall.  Left shoulder injury and pain. EXAM: DG SHOULDER 3 V*L* COMPARISON:  None Available. FINDINGS: There is no evidence of fracture or dislocation. There is no evidence of arthropathy or other focal bone lesions involving the shoulder. Soft tissues are unremarkable. Old fracture deformities of the left anterolateral 6th and 7th ribs are noted. IMPRESSION: No acute findings. Old left rib fracture deformities. Electronically Signed   By: Norleen DELENA Kil M.D.   On: 01/18/2024 13:15   DG Knee Complete 4 Views Left Result Date: 01/18/2024 CLINICAL DATA:  Fall.  Left knee injury and pain. EXAM: LEFT KNEE - COMPLETE 4+ VIEW COMPARISON:  None Available. FINDINGS: Total knee arthroplasty is seen. No evidence of fracture, dislocation, or joint effusion. No evidence of arthropathy or other focal bone abnormality. Infrapatellar soft tissue swelling noted. IMPRESSION: Infrapatellar soft tissue swelling. No evidence of fracture or dislocation. Electronically Signed   By: Norleen DELENA Kil M.D.   On: 01/18/2024 13:13     Procedures   Medications Ordered in the ED - No data to display  Clinical Course as of 01/18/24 1718  Sun Jan 18, 2024  1259 X-rays of left shoulder and left knee did not  show any obvious fracture or dislocation.  Awaiting radiology reading. [MB]  1336 X-rays did not show any obvious fracture or dislocation.  Reviewed with patient.  She does not want a prescription for any stronger pain medicine.  She has an orthopedist to follow-up with. [MB]    Clinical Course User Index [MB] Towana Ozell BROCKS, MD                                 Medical Decision Making Amount and/or Complexity of Data Reviewed Radiology: ordered.   This patient complains of left shoulder and left knee pain after a fall; this involves an extensive number of treatment Options and is a complaint that carries with it a high risk of complications and morbidity. The differential includes contusion, fracture, dislocation I ordered imaging studies which included x-rays left shoulder left knee and I independently    visualized and interpreted imaging which showed no acute fracture or dislocation Additional history obtained from patient's companion Previous records obtained and reviewed in epic including outpatient clinic notes Social determinants considered, no significant barriers Critical Interventions: None  After the interventions stated above, I reevaluated the patient and found patient still to be in significant pain although does not want any medication Admission and further testing considered, no indications for admission.  Recommended close outpatient follow-up with orthopedics.  Sling for comfort.  Return instructions discussed      Final diagnoses:  Injury of left shoulder, initial encounter  Acute pain of left knee    ED Discharge Orders     None          Towana Ozell BROCKS, MD 01/18/24 1720

## 2024-01-18 NOTE — ED Triage Notes (Signed)
 Pt fell yesterday landing on LT side; c/o pain to LT shoulder/upper arm and LT knee

## 2024-01-18 NOTE — Discharge Instructions (Signed)
 You were seen in the emergency department for left shoulder and left knee pain after a fall.  Your x-rays did not show any obvious fracture or dislocation.  Please ice the area and use the sling for comfort.  Tylenol  and ibuprofen for pain as needed.  Follow-up with your orthopedic and primary care doctor.  Return if any worsening or concerning symptoms.

## 2024-01-22 DIAGNOSIS — M7918 Myalgia, other site: Secondary | ICD-10-CM | POA: Diagnosis not present

## 2024-03-19 ENCOUNTER — Ambulatory Visit: Admitting: Internal Medicine

## 2024-03-30 ENCOUNTER — Ambulatory Visit: Admitting: Internal Medicine

## 2024-04-20 ENCOUNTER — Encounter
